# Patient Record
Sex: Female | Born: 1935 | Race: White | Hispanic: No | Marital: Married | Smoking: Never smoker
Health system: Southern US, Community
[De-identification: ages and names within clinical notes are randomized; demographics above are authoritative.]

## PROBLEM LIST (undated history)

## (undated) DIAGNOSIS — I4891 Unspecified atrial fibrillation: Secondary | ICD-10-CM

## (undated) DIAGNOSIS — M1990 Unspecified osteoarthritis, unspecified site: Secondary | ICD-10-CM

## (undated) DIAGNOSIS — Z8759 Personal history of other complications of pregnancy, childbirth and the puerperium: Secondary | ICD-10-CM

## (undated) DIAGNOSIS — I358 Other nonrheumatic aortic valve disorders: Secondary | ICD-10-CM

## (undated) DIAGNOSIS — I5022 Chronic systolic (congestive) heart failure: Secondary | ICD-10-CM

## (undated) DIAGNOSIS — I8390 Asymptomatic varicose veins of unspecified lower extremity: Secondary | ICD-10-CM

## (undated) DIAGNOSIS — Z8719 Personal history of other diseases of the digestive system: Secondary | ICD-10-CM

## (undated) DIAGNOSIS — E785 Hyperlipidemia, unspecified: Secondary | ICD-10-CM

## (undated) DIAGNOSIS — K219 Gastro-esophageal reflux disease without esophagitis: Secondary | ICD-10-CM

## (undated) DIAGNOSIS — H9190 Unspecified hearing loss, unspecified ear: Secondary | ICD-10-CM

## (undated) DIAGNOSIS — C4491 Basal cell carcinoma of skin, unspecified: Secondary | ICD-10-CM

## (undated) DIAGNOSIS — N289 Disorder of kidney and ureter, unspecified: Secondary | ICD-10-CM

## (undated) DIAGNOSIS — Z87442 Personal history of urinary calculi: Secondary | ICD-10-CM

## (undated) DIAGNOSIS — H9319 Tinnitus, unspecified ear: Secondary | ICD-10-CM

## (undated) DIAGNOSIS — Z973 Presence of spectacles and contact lenses: Secondary | ICD-10-CM

## (undated) DIAGNOSIS — I495 Sick sinus syndrome: Secondary | ICD-10-CM

## (undated) DIAGNOSIS — I10 Essential (primary) hypertension: Secondary | ICD-10-CM

## (undated) DIAGNOSIS — E669 Obesity, unspecified: Secondary | ICD-10-CM

## (undated) DIAGNOSIS — F419 Anxiety disorder, unspecified: Secondary | ICD-10-CM

## (undated) HISTORY — PX: NASAL SEPTUM SURGERY: SHX37

## (undated) HISTORY — DX: Hyperlipidemia, unspecified: E78.5

## (undated) HISTORY — PX: TONSILLECTOMY: SUR1361

## (undated) HISTORY — DX: Asymptomatic varicose veins of unspecified lower extremity: I83.90

## (undated) HISTORY — PX: DILATION AND CURETTAGE OF UTERUS: SHX78

## (undated) HISTORY — PX: CATARACT EXTRACTION W/ INTRAOCULAR LENS  IMPLANT, BILATERAL: SHX1307

## (undated) HISTORY — DX: Unspecified osteoarthritis, unspecified site: M19.90

## (undated) HISTORY — DX: Essential (primary) hypertension: I10

## (undated) HISTORY — DX: Other nonrheumatic aortic valve disorders: I35.8

## (undated) HISTORY — DX: Disorder of kidney and ureter, unspecified: N28.9

## (undated) HISTORY — DX: Obesity, unspecified: E66.9

## (undated) HISTORY — DX: Chronic systolic (congestive) heart failure: I50.22

## (undated) HISTORY — DX: Gastro-esophageal reflux disease without esophagitis: K21.9

---

## 1975-11-11 HISTORY — PX: BREAST CYST EXCISION: SHX579

## 1998-07-30 ENCOUNTER — Ambulatory Visit (HOSPITAL_COMMUNITY): Admission: RE | Admit: 1998-07-30 | Discharge: 1998-07-30 | Payer: Self-pay | Admitting: Family Medicine

## 1998-08-02 ENCOUNTER — Ambulatory Visit (HOSPITAL_COMMUNITY): Admission: RE | Admit: 1998-08-02 | Discharge: 1998-08-02 | Payer: Self-pay | Admitting: Family Medicine

## 1999-03-27 ENCOUNTER — Other Ambulatory Visit: Admission: RE | Admit: 1999-03-27 | Discharge: 1999-03-27 | Payer: Self-pay | Admitting: Family Medicine

## 2000-06-26 ENCOUNTER — Other Ambulatory Visit: Admission: RE | Admit: 2000-06-26 | Discharge: 2000-06-26 | Payer: Self-pay | Admitting: *Deleted

## 2000-09-04 ENCOUNTER — Encounter (INDEPENDENT_AMBULATORY_CARE_PROVIDER_SITE_OTHER): Payer: Self-pay | Admitting: Specialist

## 2000-09-04 ENCOUNTER — Ambulatory Visit (HOSPITAL_COMMUNITY): Admission: RE | Admit: 2000-09-04 | Discharge: 2000-09-04 | Payer: Self-pay | Admitting: *Deleted

## 2000-11-10 HISTORY — PX: OVARIAN CYST REMOVAL: SHX89

## 2000-11-17 ENCOUNTER — Encounter: Admission: RE | Admit: 2000-11-17 | Discharge: 2000-11-17 | Payer: Self-pay

## 2001-08-31 ENCOUNTER — Encounter: Payer: Self-pay | Admitting: Internal Medicine

## 2001-08-31 ENCOUNTER — Ambulatory Visit (HOSPITAL_COMMUNITY): Admission: RE | Admit: 2001-08-31 | Discharge: 2001-08-31 | Payer: Self-pay | Admitting: Internal Medicine

## 2002-09-01 ENCOUNTER — Ambulatory Visit (HOSPITAL_COMMUNITY): Admission: RE | Admit: 2002-09-01 | Discharge: 2002-09-01 | Payer: Self-pay | Admitting: Internal Medicine

## 2002-09-01 ENCOUNTER — Encounter: Payer: Self-pay | Admitting: Internal Medicine

## 2003-08-16 ENCOUNTER — Encounter: Admission: RE | Admit: 2003-08-16 | Discharge: 2003-08-16 | Payer: Self-pay | Admitting: Urology

## 2003-08-16 ENCOUNTER — Encounter: Payer: Self-pay | Admitting: Urology

## 2003-09-06 ENCOUNTER — Ambulatory Visit (HOSPITAL_COMMUNITY): Admission: RE | Admit: 2003-09-06 | Discharge: 2003-09-06 | Payer: Self-pay | Admitting: Obstetrics and Gynecology

## 2003-11-15 ENCOUNTER — Inpatient Hospital Stay (HOSPITAL_COMMUNITY): Admission: AD | Admit: 2003-11-15 | Discharge: 2003-11-15 | Payer: Self-pay | Admitting: Obstetrics & Gynecology

## 2003-11-15 ENCOUNTER — Ambulatory Visit (HOSPITAL_COMMUNITY): Admission: RE | Admit: 2003-11-15 | Discharge: 2003-11-15 | Payer: Self-pay | Admitting: Obstetrics and Gynecology

## 2003-11-15 ENCOUNTER — Encounter (INDEPENDENT_AMBULATORY_CARE_PROVIDER_SITE_OTHER): Payer: Self-pay | Admitting: Specialist

## 2005-03-25 ENCOUNTER — Encounter: Admission: RE | Admit: 2005-03-25 | Discharge: 2005-03-25 | Payer: Self-pay | Admitting: Allergy and Immunology

## 2005-04-24 ENCOUNTER — Other Ambulatory Visit: Admission: RE | Admit: 2005-04-24 | Discharge: 2005-04-24 | Payer: Self-pay | Admitting: Obstetrics and Gynecology

## 2005-05-20 ENCOUNTER — Encounter: Admission: RE | Admit: 2005-05-20 | Discharge: 2005-05-20 | Payer: Self-pay | Admitting: Internal Medicine

## 2005-10-21 ENCOUNTER — Ambulatory Visit: Payer: Self-pay | Admitting: Internal Medicine

## 2006-08-24 ENCOUNTER — Encounter: Admission: RE | Admit: 2006-08-24 | Discharge: 2006-08-24 | Payer: Self-pay | Admitting: Internal Medicine

## 2006-09-03 ENCOUNTER — Encounter: Admission: RE | Admit: 2006-09-03 | Discharge: 2006-09-03 | Payer: Self-pay | Admitting: Internal Medicine

## 2008-02-03 ENCOUNTER — Ambulatory Visit (HOSPITAL_COMMUNITY): Admission: RE | Admit: 2008-02-03 | Discharge: 2008-02-03 | Payer: Self-pay | Admitting: *Deleted

## 2008-03-16 ENCOUNTER — Ambulatory Visit: Admission: RE | Admit: 2008-03-16 | Discharge: 2008-03-16 | Payer: Self-pay | Admitting: Internal Medicine

## 2008-07-20 ENCOUNTER — Encounter: Admission: RE | Admit: 2008-07-20 | Discharge: 2008-07-20 | Payer: Self-pay | Admitting: Obstetrics and Gynecology

## 2009-10-10 ENCOUNTER — Encounter: Admission: RE | Admit: 2009-10-10 | Discharge: 2009-10-10 | Payer: Self-pay | Admitting: Internal Medicine

## 2010-07-17 ENCOUNTER — Encounter: Admission: RE | Admit: 2010-07-17 | Discharge: 2010-07-17 | Payer: Self-pay | Admitting: Internal Medicine

## 2010-08-07 ENCOUNTER — Encounter: Admission: RE | Admit: 2010-08-07 | Discharge: 2010-08-07 | Payer: Self-pay | Admitting: Interventional Radiology

## 2010-08-14 ENCOUNTER — Encounter: Admission: RE | Admit: 2010-08-14 | Discharge: 2010-08-14 | Payer: Self-pay | Admitting: Interventional Radiology

## 2010-09-11 ENCOUNTER — Encounter: Admission: RE | Admit: 2010-09-11 | Discharge: 2010-09-11 | Payer: Self-pay | Admitting: Interventional Radiology

## 2010-11-26 ENCOUNTER — Encounter
Admission: RE | Admit: 2010-11-26 | Discharge: 2010-11-26 | Payer: Self-pay | Source: Home / Self Care | Attending: Obstetrics and Gynecology | Admitting: Obstetrics and Gynecology

## 2010-11-30 ENCOUNTER — Encounter: Payer: Self-pay | Admitting: Internal Medicine

## 2010-12-03 ENCOUNTER — Encounter
Admission: RE | Admit: 2010-12-03 | Discharge: 2010-12-03 | Payer: Self-pay | Source: Home / Self Care | Attending: Obstetrics and Gynecology | Admitting: Obstetrics and Gynecology

## 2011-01-16 ENCOUNTER — Other Ambulatory Visit: Payer: Self-pay | Admitting: Interventional Radiology

## 2011-01-16 DIAGNOSIS — I83811 Varicose veins of right lower extremities with pain: Secondary | ICD-10-CM

## 2011-01-28 ENCOUNTER — Ambulatory Visit
Admission: RE | Admit: 2011-01-28 | Discharge: 2011-01-28 | Disposition: A | Discharge status: Home / Self Care | Payer: BLUE CROSS/BLUE SHIELD | Source: Ambulatory Visit | Attending: Interventional Radiology | Admitting: Interventional Radiology

## 2011-01-28 DIAGNOSIS — I83811 Varicose veins of right lower extremities with pain: Secondary | ICD-10-CM

## 2011-03-25 NOTE — Op Note (Signed)
NAMESHIAN, MAZZOTTA                ACCOUNT NO.:  [id number]   MEDICAL RECORD NO.:  [id number]          PATIENT TYPE:  AMB   LOCATION:  ENDO                         FACILITY:  [facility]   PHYSICIAN:  Waverly Ferrari, M.D.    DATE OF BIRTH:  [birth year]   DATE OF PROCEDURE:  DATE OF DISCHARGE:                               OPERATIVE REPORT   PROCEDURE:  Colonoscopy.   INDICATIONS:  Colon cancer screening.   ANESTHESIA:  Fentanyl 125 mcg, Versed 12.5 mg.   PROCEDURE:  With the patient mildly sedated in the left lateral  decubitus position, the Pentax videoscopic colonoscope was inserted in  the rectum and passed under direct vision to the cecum, identified by  the base of the cecum and ileocecal valve, both of which were  photographed.  From this point the colonoscope was slowly withdrawn,  taking circumferential views of the colonic mucosa, stopping only in the  rectum, which appeared normal on direct and showed hemorrhoids on  retroflexed view.  The endoscope was straightened and withdrawn.  The  patient's vital signs and pulse oximeter remained stable.  The patient  tolerated procedure well without apparent complications.   FINDINGS:  Internal hemorrhoids, otherwise unremarkable colonoscopic  examination to the cecum.   PLAN:  Consider repeat examination in 5-10 years.           ______________________________  Waverly Ferrari, M.D.     GMO/MEDQ  D:  02/03/2008  T:  02/03/2008  Job:  [id number]

## 2011-03-28 NOTE — Op Note (Signed)
NAME:  [name], QUIHUIS                          ACCOUNT NO.:  [id number]   MEDICAL RECORD NO.:  [id number]                   PATIENT TYPE:  AMB   LOCATION:  SDC                                  FACILITY:  [facility]   PHYSICIAN:  Darlyn Chamber, M.D.                DATE OF BIRTH:  Apr 13, 1930   DATE OF PROCEDURE:  11/15/2003  DATE OF DISCHARGE:                                 OPERATIVE REPORT   PREOPERATIVE DIAGNOSIS:  Left ovarian cyst.   POSTOPERATIVE DIAGNOSIS:  Left ovarian cyst.   OPERATIVE PROCEDURE:  Open laparoscopy, left salpingo-oophorectomy.   SURGEON:  Darlyn Chamber, M.D.   ANESTHESIA:  General endotracheal.   ESTIMATED BLOOD LOSS:  Minimal.   PACKS AND DRAINS:  None.   INTRAOPERATIVE BLOOD REPLACED:  None.   COMPLICATIONS:  None.   INDICATIONS:  As noted in the history and physical.   The procedure is as follows:  The patient was taken to the OR and placed in  the supine position.  After a satisfactory level of general endotracheal  anesthesia was obtained, the patient was placed in the dorsal lithotomy  position using the Allen stirrups.  The abdomen, perineum, and vagina were  prepped out with Betadine.  Bladder was emptied by in-and-out  catheterization.  A Hulka tenaculum was put in place and secured.  The  patient was draped out for laparoscopy.  A subumbilical incision was made  with a knife.  The incision was extended through the subcutaneous tissue.  The fascia was identified and entered.  The incision in the fascia extended  laterally.  The peritoneum was entered.  There was no evidence of injury to  adjacent organs.  Open laparoscopic trocar was put in place and secured and  the abdomen was inflated with carbon dioxide.  The operative laparoscope was  then introduced.  There was no evidence of injury to adjacent organs.  A 5  mm trocar was put in place in the suprapubic area in the left lower  quadrant.  Visualization revealed cystic enlargement of the left  ovary  measuring approximately 6-7 cm.  There were no adhesions or external  excrescences, no free fluid was noted.  The right ovary and tube were  normal.  The uterus was normal.  The upper abdomen including liver and tip  of the gallbladder were clear.  The appendix must have been retrocecal.  It  was difficult to visualize, but there was no evidence of abnormalities in  either gutter.  We obtained pelvic washings.  We then secured the cyst in  the left ovary, identified the left ovarian vasculature.  The ureter was  deep in the pelvis away from the vasculature.  Using the Gyrus bipolar, the  left ovarian vasculature was cauterized and incised, the left tube and  mesosalpinx were cauterized and incised.  The mesenteric attachments to the  ovary were cauterized and incised,  and the left utero-ovarian pedicle was  cauterized and incised.  Using the Endobag, the ovary was placed in the  Endobag and removed through the subumbilical incision intact and sent for  pathologic review.  Revisualization in the pelvic cavity revealed good  hemostasis.  We thoroughly irrigated the pelvis, and again nothing was  bleeding.  At this point in time the abdomen was deflated of its carbon  dioxide, all trocars removed, subumbilical fascia closed with two figure-of-  eights of 0 Vicryl, skin with interrupted subcuticular 4-0 Vicryl.  The  suprapubic incision was closed with Dermabond.  The Hulka tenaculum was then  removed, the patient taken out of the dorsal lithotomy position, once alert  and extubated transferred to the recovery room in good condition.  The  sponge, instrument, and needle count reported as correct by the circulating  nurse x2.                                               Darlyn Chamber, M.D.    JSM/MEDQ  D:  11/15/2003  T:  11/15/2003  Job:  [id number]

## 2011-03-28 NOTE — Procedures (Signed)
[facility]  Patient:    [name], [name]                         MRN: [id number] Adm. Date:  [id number] Attending:  Jim Desanctis                           Procedure Report  PROCEDURE:  Colonoscopy.  INDICATIONS:  Hemoccult positivity.  ANESTHESIA:  Demerol an extra 20 mg, Versed 2 mg.  DESCRIPTION OF PROCEDURE:  With patient mildly sedated in the left lateral decubitus position, the Olympus videoscopic colonoscope was inserted in the rectum and passed under direct vision to the cecum.  Cecum identified by the ileocecal valve and appendiceal orifice, both of which were photographed after we had cleared the cecum of some fecal debris.  The endoscope was then placed in partial flexion to enter into the terminal ileum, which appeared normal as well.  The endoscope was then withdrawn, taking circumferential views of the terminal ileum and subsequently the remaining colonic mucosa, which otherwise appeared normal other than a small diverticulum seen in the sigmoid colon. The endoscope was pulled back to the rectum, which appeared normal on direct and retroflexed view.  The endoscope was straightened and withdrawn.  The patients vital signs and pulse oximetry remained stable.  The patient tolerated the procedure well without apparent complications.  FINDINGS:  Rare diverticula in the sigmoid colon, otherwise unremarkable colonoscopic examination.  PLAN:  Repeat examination in five to 10 years. DD:  09/04/00 TD:  09/04/00 Job: [id number]

## 2011-03-28 NOTE — H&P (Signed)
NAME:  [name], [name]                          ACCOUNT NO.:  [id number]   MEDICAL RECORD NO.:  [id number]                   PATIENT TYPE:  AMB   LOCATION:  SDC                                  FACILITY:  [facility]   PHYSICIAN:  Darlyn Chamber, M.D.                DATE OF BIRTH:  [birth year]   DATE OF ADMISSION:  11/15/2003  DATE OF DISCHARGE:                                HISTORY & PHYSICAL   This is a 77 year old postmenopausal white female who presents for  laparoscopic left salpingo-oophorectomy.   HISTORY OF PRESENT ILLNESS:  In relation to the present admission, the  patient was referred to our practice by Mary Boxer. Roberson for evaluation  of a left adnexal mass.  She has been evaluated for left-sided  abdominopelvic discomfort.  It was thought to be secondary to renal  lithiasis.  She underwent a CT scan that revealed a 6 cm left ovarian mass.  Subsequent ultrasound revealed a large cystic structure measuring 6 cm in  greatest dimension.  Several calcifications were noted with one septation.  This was felt to be a complex cyst.  She was seen in our office for further  evaluation.  Ultrasound evaluation confirmed the presence of a 6 cm mostly  simple cyst of the left ovary.  There was one septation.  Her CA-125 was  obtained and this was actually within normal limits.  No free fluid was  noted, no other signs of abnormality.  In view of that the patient now  presents for laparoscopic left salpingo-oophorectomy for further evaluation  and indeed management.   ALLERGIES:  She has no known drug allergies.   MEDICATIONS:  1. Norvasc.  2. Toprol.  3. Cardizem.   PAST MEDICAL HISTORY:  The patient does have a history of hypertension under  active evaluation and management by [name].  Otherwise, usual childhood  disease without any significant sequelae.   PAST SURGICAL HISTORY:  The patient had a breast biopsy done 30 years ago.   OBSTETRICAL HISTORY:  She had two spontaneous  vaginal deliveries.   FAMILY HISTORY:  Mother has history of diabetes.  She has a paternal  grandfather with colon cancer.  Mother also has a history of hypertension.  Father has history of heart disease.   SOCIAL HISTORY:  Reveals no tobacco or alcohol use.   REVIEW OF SYSTEMS:  Noncontributory.   PHYSICAL EXAMINATION:  VITAL SIGNS:  The patient is afebrile with stable  vital signs.  HEENT:  The patient is normocephalic.  Pupils are equal, round and reactive  to light and accommodation.  Extraocular movements were intact.  Sclerae and  conjunctivae clear.  Oropharynx clear.  NECK:  Without thyromegaly.  BREASTS:  No discreet masses.  LUNGS:  Clear.  CARDIOVASCULAR:  Regular rate and rhythm without murmurs or gallops.  ABDOMEN:  Exam is benign, no mass, organomegaly or tenderness.  PELVIC:  Normal  external genitalia.  Vaginal mucosa is clear.  Cervix  unremarkable.  Uterus normal size, shape and contour.  Left adnexal  fullness.  Right adnexa unremarkable.  EXTREMITIES:  Trace edema.  NEUROLOGIC:  Exam is grossly within normal limits.   IMPRESSION:  Cyst of the left ovary, rule out ovarian neoplasm.   PLAN:  The patient will undergo laparoscopic removal.  She does understand  should this be malignant or borderline, further staging procedures may be  required.  The overall risks of surgery have been discussed including the  risk of anesthetics.  The risk of infection.  The risk of vascular injury  that could lead to hemorrhage requiring transfusion with the risk of AIDS or  hepatitis.  The risk of injury to adjacent organs that could require further  exploratory surgery.  The risk of deep venous thrombosis and pulmonary  embolus.  The patient expressed her understanding of indications and risks.                                               Darlyn Chamber, M.D.    JSM/MEDQ  D:  11/15/2003  T:  11/15/2003  Job:  [id number]

## 2011-03-28 NOTE — Procedures (Signed)
[facility]  Patient:    [name], [name]                         MRN: [id number] Proc. Date: 09/04/00 Adm. Date:  [id number] Attending:  Jim Desanctis                           Procedure Report  PROCEDURE:  Upper endoscopy.  SURGEON:  Jim Desanctis, M.D.  INDICATIONS:  Hemoccult positivity.  ANESTHESIA:  Demerol 60 mg and Versed 6 mg.  DESCRIPTION OF PROCEDURE:  With the patient mildly sedated in the left lateral decubitus position, the Olympus videoscopic endoscope was inserted in the mouth and passed under direct vision through the esophagus which appeared normal.  The stomach, fundus, and body all appeared normal.  There was a small area in the antrum which was erythematous which we photographed and biopsied. The duodenal bulb and second portion of the duodenum appeared normal.  From this point, the endoscope was slowly withdrawn, taking circumferential views of the entire duodenal mucosa until the endoscope had been pulled back into the stomach and placed in retroflexion to view the stomach from below and this appeared normal and was photographed.  The endoscope was straightened and withdrawn, taking circumferential views of the remaining gastric and esophageal mucosa which otherwise appeared normal.  The patients vital signs and pulse oximeter remained stable.  The patient tolerated the procedure well and without apparent complications.  FINDINGS:  Localized erythema of the antrum, await biopsy report.  The patient will call me for the results and follow up with me as an outpatient and proceed to colonoscopy as planned. DD:  09/04/00 TD:  09/04/00 Job: [id number]

## 2011-06-24 ENCOUNTER — Other Ambulatory Visit: Payer: Self-pay | Admitting: Internal Medicine

## 2011-06-24 DIAGNOSIS — N6459 Other signs and symptoms in breast: Secondary | ICD-10-CM

## 2011-07-02 ENCOUNTER — Ambulatory Visit
Admission: RE | Admit: 2011-07-02 | Discharge: 2011-07-02 | Disposition: A | Discharge status: Home / Self Care | Payer: BLUE CROSS/BLUE SHIELD | Source: Ambulatory Visit | Attending: Internal Medicine | Admitting: Internal Medicine

## 2011-07-02 DIAGNOSIS — N6459 Other signs and symptoms in breast: Secondary | ICD-10-CM

## 2011-07-31 ENCOUNTER — Encounter: Payer: Self-pay | Admitting: Cardiology

## 2011-08-14 ENCOUNTER — Encounter: Payer: Self-pay | Admitting: Cardiology

## 2011-08-14 ENCOUNTER — Ambulatory Visit (INDEPENDENT_AMBULATORY_CARE_PROVIDER_SITE_OTHER): Payer: Medicare Other | Admitting: Cardiology

## 2011-08-14 VITALS — BP 130/60 | HR 60 | Ht 64.0 in | Wt 182.0 lb

## 2011-08-14 DIAGNOSIS — I10 Essential (primary) hypertension: Secondary | ICD-10-CM

## 2011-08-14 DIAGNOSIS — E785 Hyperlipidemia, unspecified: Secondary | ICD-10-CM

## 2011-08-14 DIAGNOSIS — R001 Bradycardia, unspecified: Secondary | ICD-10-CM

## 2011-08-14 DIAGNOSIS — I498 Other specified cardiac arrhythmias: Secondary | ICD-10-CM

## 2011-08-14 NOTE — Patient Instructions (Signed)
It would be reasonable to reduce your metoprolol to 25 mg twice a day.  Continue your other medications.  I will be happy to see you as needed.

## 2011-08-14 NOTE — Assessment & Plan Note (Signed)
Blood pressure is under excellent control on her current medications. Since her blood pressure is doing so well off recommend reducing her metoprolol further to 25 mg twice a day. I think this will help with her energy level. She is significantly bradycardic on her current therapy. She is going to monitor her blood pressure medicine though if it increases with this reduction in dose. Otherwise I feel she is an excellent cardiac health and no appropriate risk factor modification. I do not feel that any further cardiac testing is warranted at this time and I will see her back as needed.

## 2011-08-14 NOTE — Progress Notes (Signed)
[name] Date of Birth: 1930/10/16   History of Present Illness: [name] is seen at the request of Dr. Shelia Media for cardiac evaluation. She has a history of hypertension and dyslipidemia. She really denies any significant cardiac complaints. She does feel fatigued at times. She was evaluated in 2007 with an echocardiogram which was unremarkable. She also had a nuclear stress test which was normal. She was noted to be significantly bradycardic at that time and her metoprolol dose was reduced to 50 mg twice a day. She did notice an improvement in her energy level with this. Her blood pressure has been under excellent control. She denies any current chest pain, shortness of breath, palpitations, or dizziness.  Current Outpatient Prescriptions on File Prior to Visit  Medication Sig Dispense Refill  . atorvastatin (LIPITOR) 10 MG tablet Take 5 mg by mouth daily.        . metoprolol (LOPRESSOR) 100 MG tablet Take 100 mg by mouth 2 (two) times daily. 1/2 tablet daily      . Multiple Vitamin (MULTIVITAMIN) tablet Take 1 tablet by mouth daily.          Allergies  Allergen Reactions  . Ace Inhibitors   . Celebrex (Celecoxib)   . Norvasc (Amlodipine Besylate)     Higher doses   . Vioxx (Rofecoxib)     Past Medical History  Diagnosis Date  . Hypertension   . Bradycardia   . Chronic cough   . Aortic valve sclerosis   . Edema of lower extremity   . Hyperlipidemia   . OA (osteoarthritis)   . GERD (gastroesophageal reflux disease)   . Renal insufficiency     Past Surgical History  Procedure Date  . Cyst removal from ovary 2002  . Nasal septum surgery   . Cardiovascular stress test 06/25/2006    EF 62%    History  Smoking status  . Never Smoker   Smokeless tobacco  . Not on file    History  Alcohol Use No    Family History  Problem Relation Age of Onset  . Diabetes Mother   . Heart disease Father   . Hypertension Sister   . Hypertension Brother     Review of  Systems: As noted in history of present illness..  All other systems were reviewed and are negative.  Physical Exam: BP 130/60  Pulse 60  Ht 5\' 4"  (1.626 m)  Wt 182 lb (82.555 kg)  BMI 31.24 kg/m2 The patient is alert and oriented x 3.  The mood and affect are normal.  The skin is warm and dry.  Color is normal.  The HEENT exam reveals that the sclera are nonicteric.  The mucous membranes are moist.  The carotids are 2+ without bruits.  There is no thyromegaly.  There is no JVD.  The lungs are clear.  The chest wall is non tender.  The heart exam reveals a regular rate with a normal S1 and S2.  There are no murmurs, gallops, or rubs.  The PMI is not displaced.   Abdominal exam reveals good bowel sounds.  There is no guarding or rebound.  There is no hepatosplenomegaly or tenderness.  There are no masses.  Exam of the legs reveal no clubbing, cyanosis, or edema.  The legs are without rashes.  The distal pulses are intact.  Cranial nerves II - XII are intact.  Motor and sensory functions are intact.  The gait is normal.  LABORATORY DATA: ECG dated  08/07/2011 shows sinus bradycardia with a rate of 51 beats per minute. There is left axis deviation with voltage criteria for LVH.  Assessment / Plan:

## 2011-10-09 ENCOUNTER — Encounter: Payer: Self-pay | Admitting: Cardiology

## 2012-05-20 ENCOUNTER — Ambulatory Visit (HOSPITAL_COMMUNITY): Payer: Medicare Other | Attending: Nurse Practitioner

## 2012-05-20 ENCOUNTER — Encounter: Payer: Self-pay | Admitting: Nurse Practitioner

## 2012-05-20 ENCOUNTER — Ambulatory Visit (INDEPENDENT_AMBULATORY_CARE_PROVIDER_SITE_OTHER): Payer: Medicare Other | Admitting: Nurse Practitioner

## 2012-05-20 VITALS — BP 128/68 | HR 52 | Ht 64.0 in | Wt 184.8 lb

## 2012-05-20 DIAGNOSIS — I079 Rheumatic tricuspid valve disease, unspecified: Secondary | ICD-10-CM | POA: Insufficient documentation

## 2012-05-20 DIAGNOSIS — R001 Bradycardia, unspecified: Secondary | ICD-10-CM

## 2012-05-20 DIAGNOSIS — F430 Acute stress reaction: Secondary | ICD-10-CM | POA: Insufficient documentation

## 2012-05-20 DIAGNOSIS — N289 Disorder of kidney and ureter, unspecified: Secondary | ICD-10-CM | POA: Insufficient documentation

## 2012-05-20 DIAGNOSIS — I498 Other specified cardiac arrhythmias: Secondary | ICD-10-CM | POA: Insufficient documentation

## 2012-05-20 DIAGNOSIS — I059 Rheumatic mitral valve disease, unspecified: Secondary | ICD-10-CM | POA: Insufficient documentation

## 2012-05-20 DIAGNOSIS — I10 Essential (primary) hypertension: Secondary | ICD-10-CM | POA: Insufficient documentation

## 2012-05-20 DIAGNOSIS — E785 Hyperlipidemia, unspecified: Secondary | ICD-10-CM

## 2012-05-20 DIAGNOSIS — R609 Edema, unspecified: Secondary | ICD-10-CM

## 2012-05-20 DIAGNOSIS — I517 Cardiomegaly: Secondary | ICD-10-CM | POA: Insufficient documentation

## 2012-05-20 DIAGNOSIS — I359 Nonrheumatic aortic valve disorder, unspecified: Secondary | ICD-10-CM | POA: Insufficient documentation

## 2012-05-20 NOTE — Progress Notes (Signed)
[name] Date of Birth: Apr 13, 1930 Medical Record [id number]  History of Present Illness: [name] is seen today for a work in visit. She is seen for Dr. Martinique. She is a former patient of Dr. Janyth Pupa. She has HTN and HLD. No known CAD and has had prior negative stress testing. Last echo in 2007 was normal.   She comes in today. She is here with her daughter. She is here because of fatigue. She has known bradycardia and has had her BB cut back at her last visit back in October. She noted heart rates down in the 40's at home and cut it back to just 25 mg a day. She feels better. Also with some swelling in her legs. It does go down overnight. She does not use support stockings. She says she doesn't use much salt but her daughter disagrees. No chest pain. Still with chronic cough. Has GERD. Not exercising regularly but tries to stay active. Blood pressure has been doing well. She is worried about having heart failure. Apparently her father and grandfather died of CHF.   Current Outpatient Prescriptions on File Prior to Visit  Medication Sig Dispense Refill  . atorvastatin (LIPITOR) 10 MG tablet Take 5 mg by mouth daily.        . cloNIDine (CATAPRES) 0.1 MG tablet Take 0.1 mg by mouth daily.      Marland Kitchen DIOVAN HCT 320-25 MG per tablet Take 1 tablet by mouth Daily.      . Glucosamine-Chondroit-Vit C-Mn (GLUCOSAMINE 1500 COMPLEX PO) Take by mouth daily.        . Multiple Vitamin (MULTIVITAMIN) tablet Take 1 tablet by mouth daily.        . pantoprazole (PROTONIX) 40 MG tablet Take 40 mg by mouth 2 (two) times daily.       Marland Kitchen zolpidem (AMBIEN) 5 MG tablet Take 5 mg by mouth at bedtime as needed.       Marland Kitchen levocetirizine (XYZAL) 5 MG tablet Take 5 mg by mouth every evening.        Allergies  Allergen Reactions  . Ace Inhibitors   . Celebrex (Celecoxib)   . Norvasc (Amlodipine Besylate)     Higher doses   . Vioxx (Rofecoxib)     Past Medical History  Diagnosis Date  . Hypertension   .  Bradycardia   . Chronic cough   . Aortic valve sclerosis   . Edema of lower extremity   . Hyperlipidemia   . OA (osteoarthritis)   . GERD (gastroesophageal reflux disease)   . Renal insufficiency   . Obesity     Past Surgical History  Procedure Date  . Cyst removal from ovary 2002  . Nasal septum surgery   . Cardiovascular stress test 06/25/2006    EF 62%    History  Smoking status  . Never Smoker   Smokeless tobacco  . Not on file    History  Alcohol Use No    Family History  Problem Relation Age of Onset  . Diabetes Mother   . Heart disease Father   . Hypertension Sister   . Hypertension Brother     Review of Systems: The review of systems is per the HPI.  All other systems were reviewed and are negative.  Physical Exam: BP 128/68  Pulse 52  Ht 5\' 4"  (1.626 m)  Wt 184 lb 12.8 oz (83.825 kg)  BMI 31.72 kg/m2 Patient is very pleasant and in no acute distress. She is  obese. Skin is warm and dry. Color is normal.  HEENT is unremarkable. Normocephalic/atraumatic. PERRL. Sclera are nonicteric. Neck is supple. No masses. No JVD. Lungs are clear. Cardiac exam shows a regular rate and rhythm. Abdomen is soft. Extremities are with trace edema. She has multiple varicosities. Gait and ROM are intact. No gross neurologic deficits noted.  LABORATORY DATA: EKG today shows sinus bradycardia. Rate of 52.    Assessment / Plan:

## 2012-05-20 NOTE — Patient Instructions (Addendum)
Stay on your current medicines but cut the Metoprolol 25 mg to a half two times a day  Minimize your salt use  Get some support stockings and wear during the course of the day  We are going to check an ultrasound of your heart  We will see you back as needed.  Call the [facility] office at [phone] if you have any questions, problems or concerns.

## 2012-05-20 NOTE — Progress Notes (Signed)
Echocardiogram performed.  

## 2012-05-20 NOTE — Assessment & Plan Note (Signed)
She does have some mild edema on exam. She does report that it goes down overnight. I suspect she is using too much salt. She is on HCTZ. I have asked her to cut back on her salt use and use support stockings. If this does not help, then we will address the Norvasc. Will also update her echo. We will tentatively see her back as needed. Patient is agreeable to this plan and will call if any problems develop in the interim.

## 2012-05-20 NOTE — Assessment & Plan Note (Signed)
Blood pressure looks good. I have left her on her current regimen.

## 2012-05-20 NOTE — Assessment & Plan Note (Signed)
She has cut her metoprolol back further. She is on the short acting. I have changed her over to 12.5 BID. She will continue to monitor.

## 2012-06-01 ENCOUNTER — Other Ambulatory Visit: Payer: Self-pay | Admitting: Internal Medicine

## 2012-06-01 DIAGNOSIS — N6489 Other specified disorders of breast: Secondary | ICD-10-CM

## 2012-09-09 ENCOUNTER — Ambulatory Visit
Admission: RE | Admit: 2012-09-09 | Discharge: 2012-09-09 | Disposition: A | Discharge status: Home / Self Care | Payer: Medicare Other | Source: Ambulatory Visit | Attending: Internal Medicine | Admitting: Internal Medicine

## 2012-09-09 DIAGNOSIS — N6489 Other specified disorders of breast: Secondary | ICD-10-CM

## 2013-03-23 ENCOUNTER — Other Ambulatory Visit: Payer: Self-pay | Admitting: Gastroenterology

## 2013-03-23 DIAGNOSIS — R109 Unspecified abdominal pain: Secondary | ICD-10-CM

## 2013-03-23 DIAGNOSIS — R634 Abnormal weight loss: Secondary | ICD-10-CM

## 2013-03-29 ENCOUNTER — Ambulatory Visit
Admission: RE | Admit: 2013-03-29 | Discharge: 2013-03-29 | Disposition: A | Discharge status: Home / Self Care | Payer: Medicare Other | Source: Ambulatory Visit | Attending: Gastroenterology | Admitting: Gastroenterology

## 2013-03-29 DIAGNOSIS — R634 Abnormal weight loss: Secondary | ICD-10-CM

## 2013-03-29 DIAGNOSIS — R109 Unspecified abdominal pain: Secondary | ICD-10-CM

## 2013-03-29 MED ORDER — IOHEXOL 300 MG/ML  SOLN
100.0000 mL | Freq: Once | INTRAMUSCULAR | Status: AC | PRN
  Administered 2013-03-29: 100 mL via INTRAVENOUS

## 2015-07-10 ENCOUNTER — Other Ambulatory Visit: Payer: Self-pay | Admitting: Family Medicine

## 2015-07-10 DIAGNOSIS — M25552 Pain in left hip: Secondary | ICD-10-CM

## 2015-07-12 ENCOUNTER — Ambulatory Visit
Admission: RE | Admit: 2015-07-12 | Discharge: 2015-07-12 | Disposition: A | Discharge status: Home / Self Care | Payer: Medicare Other | Source: Ambulatory Visit | Attending: Family Medicine | Admitting: Family Medicine

## 2015-07-12 DIAGNOSIS — M25552 Pain in left hip: Secondary | ICD-10-CM

## 2015-07-15 ENCOUNTER — Other Ambulatory Visit: Payer: Self-pay

## 2015-09-13 ENCOUNTER — Encounter: Payer: Self-pay | Admitting: Vascular Surgery

## 2015-09-13 ENCOUNTER — Other Ambulatory Visit: Payer: Self-pay | Admitting: *Deleted

## 2015-09-13 DIAGNOSIS — I83813 Varicose veins of bilateral lower extremities with pain: Secondary | ICD-10-CM

## 2015-11-08 ENCOUNTER — Encounter: Payer: Self-pay | Admitting: Vascular Surgery

## 2015-11-16 ENCOUNTER — Ambulatory Visit (INDEPENDENT_AMBULATORY_CARE_PROVIDER_SITE_OTHER): Payer: Medicare Other | Admitting: Vascular Surgery

## 2015-11-16 ENCOUNTER — Encounter: Payer: Self-pay | Admitting: Vascular Surgery

## 2015-11-16 ENCOUNTER — Ambulatory Visit (HOSPITAL_COMMUNITY)
Admission: RE | Admit: 2015-11-16 | Discharge: 2015-11-16 | Disposition: A | Discharge status: Home / Self Care | Payer: Medicare Other | Source: Ambulatory Visit | Attending: Vascular Surgery | Admitting: Vascular Surgery

## 2015-11-16 VITALS — BP 137/62 | HR 66 | Temp 97.3°F | Resp 18 | Ht 64.0 in | Wt 167.0 lb

## 2015-11-16 DIAGNOSIS — I83813 Varicose veins of bilateral lower extremities with pain: Secondary | ICD-10-CM | POA: Diagnosis present

## 2015-11-16 DIAGNOSIS — I10 Essential (primary) hypertension: Secondary | ICD-10-CM | POA: Insufficient documentation

## 2015-11-16 DIAGNOSIS — I8393 Asymptomatic varicose veins of bilateral lower extremities: Secondary | ICD-10-CM | POA: Diagnosis not present

## 2015-11-16 DIAGNOSIS — I872 Venous insufficiency (chronic) (peripheral): Secondary | ICD-10-CM | POA: Diagnosis not present

## 2015-11-16 DIAGNOSIS — E785 Hyperlipidemia, unspecified: Secondary | ICD-10-CM | POA: Insufficient documentation

## 2015-11-16 NOTE — Progress Notes (Signed)
Referred by:  Chesley Noon, MD [address] [place], [place] [address]  Reason for referral: bilateral varicose veins and swelling   History of Present Illness  [name] is a 82 y.o. ([birth year]) female s/p h/o R GSV procedure who presents with chief complaint: swelling both legs and varicose veins.  Patient notes long standing history of swelling in legs with stable varicose veins.  The patient's symptoms include: left hip pain.  The patient is being worked up for a L Total hip replacement.  The patient has had no history of DVT, known history of pregnancy, known history of varicose vein, no history of venous stasis ulcers, no history of  Lymphedema and no history of skin changes in lower legs.  There is known family history of venous disorders.  The patient has used OTC compression stockings in the past.   Past Medical History  Diagnosis Date  . Hypertension   . Bradycardia   . Chronic cough   . Aortic valve sclerosis   . Edema of lower extremity   . Hyperlipidemia   . OA (osteoarthritis)   . GERD (gastroesophageal reflux disease)   . Renal insufficiency   . Obesity   . Leg pain, left   . Hip pain     left  . Varicose veins     Past Surgical History  Procedure Laterality Date  . Cyst removal from ovary  2002  . Nasal septum surgery    . Cardiovascular stress test  06/25/2006    EF 62%    Social History   Social History  . Marital Status: Married    Spouse Name: N/A  . Number of Children: N/A  . Years of Education: N/A   Occupational History  . Not on file.   Social History Main Topics  . Smoking status: Never Smoker   . Smokeless tobacco: Not on file  . Alcohol Use: No  . Drug Use: No  . Sexual Activity: Not Currently   Other Topics Concern  . Not on file   Social History Narrative    Family History  Problem Relation Age of Onset  . Diabetes Mother   . Heart disease Father   . Hypertension Sister   . Hypertension Brother      Current Outpatient Prescriptions  Medication Sig Dispense Refill  . acetaminophen (TYLENOL) 325 MG tablet Take 650 mg by mouth every 6 (six) hours as needed.    . ALPRAZolam (XANAX) 0.5 MG tablet Take 0.5 mg by mouth 3 (three) times daily as needed for anxiety.    Marland Kitchen amLODipine (NORVASC) 10 MG tablet Take 5 mg by mouth daily.     Marland Kitchen aspirin 81 MG tablet Take 81 mg by mouth daily.    Marland Kitchen atorvastatin (LIPITOR) 10 MG tablet Take 5 mg by mouth daily.      . cloNIDine (CATAPRES) 0.1 MG tablet Take 0.1 mg by mouth daily.    . cyclobenzaprine (FLEXERIL) 10 MG tablet Take 10 mg by mouth 3 (three) times daily as needed for muscle spasms.    Marland Kitchen DIOVAN HCT 320-25 MG per tablet Take 1 tablet by mouth Daily.    . Glucosamine-Chondroit-Vit C-Mn (GLUCOSAMINE 1500 COMPLEX PO) Take by mouth daily.      Marland Kitchen HYDROcodone-acetaminophen (NORCO/VICODIN) 5-325 MG tablet Take 1 tablet by mouth every 6 (six) hours as needed for moderate pain.    Marland Kitchen levocetirizine (XYZAL) 5 MG tablet Take 5 mg by mouth every evening.    Marland Kitchen  metoprolol tartrate (LOPRESSOR) 25 MG tablet Take 25 mg by mouth 2 (two) times daily.     . Multiple Vitamin (MULTIVITAMIN) tablet Take 1 tablet by mouth daily.      . Omega-3 Fatty Acids (FISH OIL) 1000 MG CAPS Take by mouth daily.    . pantoprazole (PROTONIX) 40 MG tablet Take 40 mg by mouth 2 (two) times daily.     Marland Kitchen PREDNISONE, PAK, PO Take by mouth.    . zolpidem (AMBIEN) 5 MG tablet Take 5 mg by mouth at bedtime as needed.     . traMADol (ULTRAM) 50 MG tablet Take by mouth every 6 (six) hours as needed. Reported on 11/16/2015     No current facility-administered medications for this visit.    Allergies  Allergen Reactions  . Ace Inhibitors   . Celebrex [Celecoxib]   . Norvasc [Amlodipine Besylate]     Higher doses   . Vioxx [Rofecoxib]      REVIEW OF SYSTEMS:  (Positives checked otherwise negative)  CARDIOVASCULAR:   [ ]  chest pain,  [ ]  chest pressure,  [x]  palpitations,  [ ]   shortness of breath when laying flat,  [ ]  shortness of breath with exertion,   [x]  pain in feet when walking,  [ ]  pain in feet when laying flat, [ ]  history of blood clot in veins (DVT),  [ ]  history of phlebitis,  [ ]  swelling in legs,  [ ]  varicose veins  PULMONARY:   [ ]  productive cough,  [ ]  asthma,  [ ]  wheezing  NEUROLOGIC:   [x]  weakness in arms or legs,  [ ]  numbness in arms or legs,  [ ]  difficulty speaking or slurred speech,  [ ]  temporary loss of vision in one eye,  [ ]  dizziness  HEMATOLOGIC:   [ ]  bleeding problems,  [ ]  problems with blood clotting too easily  MUSCULOSKEL:   [ ]  joint pain, [ ]  joint swelling  GASTROINTEST:   [ ]  vomiting blood,  [ ]  blood in stool     GENITOURINARY:   [ ]  burning with urination,  [ ]  blood in urine  PSYCHIATRIC:   [ ]  history of major depression  INTEGUMENTARY:   [ ]  rashes,  [ ]  ulcers  CONSTITUTIONAL:   [ ]  fever,  [ ]  chills   Physical Examination  Filed Vitals:   11/16/15 1242  BP: 137/62  Pulse: 66  Temp: 97.3 F (36.3 C)  TempSrc: Oral  Resp: 18  Height: 5\' 4"  (1.626 m)  Weight: 167 lb (75.751 kg)  SpO2: 100%   Body mass index is 28.65 kg/(m^2).  General: A&O x 3, WD, elderly  Head: [place]/AT  Ear/Nose/Throat: Hearing grossly intact, nares without erythema or drainage, oropharynx without Erythema/Exudate, Mallampati score: 3  Eyes: PERRLA, EOMI  Neck: Supple, no nuchal rigidity, no palpable LAD  Pulmonary: Sym exp, good air movt, CTAB, no rales, rhonchi, & wheezing  Cardiac: RRR, Nl S1, S2, no Murmurs, rubs or gallops  Vascular: Vessel Right Left  Radial Palpable Palpable  Brachial Palpable Palpable  Carotid Palpable, without bruit Palpable, without bruit  Aorta Not palpable N/A  Femoral Palpable Palpable  Popliteal Not palpable Not palpable  PT Palpable Faintly Palpable  DP Palpable Faintly Palpable   Gastrointestinal: soft, NTND, no G/R, no HSM, no masses, no CVAT  B  Musculoskeletal: M/S 5/5 throughout , Extremities without ischemic changes, trace edema B, no LDS, extensive varicosities with spider veins  Neurologic: CN 2-12  intact , Pain and light touch intact in extremities , Motor exam as listed above  Psychiatric: Judgment intact, Mood & affect appropriate for pt's clinical situation  Dermatologic: See M/S exam for extremity exam, no rashes otherwise noted  Lymph : No Cervical, Axillary, or Inguinal lymphadenopathy    Non-Invasive Vascular Imaging  BLE Venous Insufficiency Duplex (Date: 11/16/2015):   RLE:   no DVT and SVT,   no GSV reflux as no vein visualized  no SSV reflux,  + deep venous reflux: CFV  LLE:  no DVT and SVT,   + GSV reflux: limited segment of reflux  No SSV reflux,  no deep venous reflux   Outside Studies/Documentation 5 pages of outside documents were reviewed including: outside Ortho chart.   Medical Decision Making  RIA KATE is a 82 y.o. female who presents with: minimal BLE chronic venous insufficiency (C2), L hip OA   Her varicose veins are cosmetic in nature and I doubt they contribute much to her leg sx given her limited CVI on insufficiency duplex.  Based on the patient's history and examination, I recommend: OTC compression.  I doubt this patient with L hip OA requiring total hip replacement, will be able to get 20-30 mm Hg compression stocking onto her legs.  There is nothing anatomically that would predispose her to DVT after hip replacement.    Reportedly, she is getting a thrombophilia panel which should investigate the medical side of the equation.  The patient can follow up as needed.  Thank you for allowing [place] to participate in this patient's care.   Adele Barthel, MD [facility] Office: [phone] Pager: [phone]  11/16/2015, 1:04 PM

## 2015-11-29 ENCOUNTER — Encounter: Payer: Self-pay | Admitting: Cardiology

## 2015-11-30 NOTE — H&P (Signed)
TOTAL HIP ADMISSION H&P  Patient is admitted for left total hip arthroplasty, anterior approach.  Subjective:  Chief Complaint:  Left hip primary OA / pain  HPI: [name], 82 y.o. female, has a history of pain and functional disability in the left hip(s) due to arthritis and patient has failed non-surgical conservative treatments for greater than 12 weeks to include NSAID's and/or analgesics, use of assistive devices and activity modification.  Onset of symptoms was gradual starting 3+ years ago with gradually worsening course since that time.The patient noted no past surgery on the left hip(s).  Patient currently rates pain in the left hip at 8 out of 10 with activity. Patient has night pain, worsening of pain with activity and weight bearing, trendelenberg gait, pain that interfers with activities of daily living and pain with passive range of motion. Patient has evidence of periarticular osteophytes and joint space narrowing by imaging studies. This condition presents safety issues increasing the risk of falls.  There is no current active infection.  Risks, benefits and expectations were discussed with the patient.  Risks including but not limited to the risk of anesthesia, blood clots, nerve damage, blood vessel damage, failure of the prosthesis, infection and up to and including death.  Patient understand the risks, benefits and expectations and wishes to proceed with surgery.   PCP: Chesley Noon, MD  D/C Plans:      Home with HHPT  Post-op Meds:       No Rx given   Tranexamic Acid:      To be given - IV   Decadron:      Is to be given  FYI:     ASA  Norco    Patient Active Problem List   Diagnosis Date Noted  . Chronic venous insufficiency 11/16/2015  . Varicose veins of both lower extremities 11/16/2015  . Edema 05/20/2012  . Bradycardia   . Hypertension   . Hyperlipidemia    Past Medical History  Diagnosis Date  . Hypertension   . Bradycardia   . Chronic cough    . Aortic valve sclerosis   . Edema of lower extremity   . Hyperlipidemia   . OA (osteoarthritis)   . GERD (gastroesophageal reflux disease)   . Renal insufficiency   . Obesity   . Leg pain, left   . Hip pain     left  . Varicose veins     Past Surgical History  Procedure Laterality Date  . Cyst removal from ovary  2002  . Nasal septum surgery    . Cardiovascular stress test  06/25/2006    EF 62%    No prescriptions prior to admission   Allergies  Allergen Reactions  . Ace Inhibitors   . Celebrex [Celecoxib]   . Norvasc [Amlodipine Besylate]     Higher doses   . Vioxx [Rofecoxib]     Social History  Substance Use Topics  . Smoking status: Never Smoker   . Smokeless tobacco: Not on file  . Alcohol Use: No    Family History  Problem Relation Age of Onset  . Diabetes Mother   . Heart disease Father   . Hypertension Sister   . Hypertension Brother      Review of Systems  Constitutional: Negative.   HENT: Negative.   Eyes: Negative.   Respiratory: Negative.   Cardiovascular: Negative.   Gastrointestinal: Positive for heartburn.  Genitourinary: Negative.   Musculoskeletal: Positive for joint pain.  Skin: Negative.  Neurological: Negative.   Endo/Heme/Allergies: Negative.   Psychiatric/Behavioral: Negative.      Objective:  Physical Exam  Constitutional: She is oriented to person, place, and time. She appears well-developed.  HENT:  Head: Normocephalic.  Eyes: Pupils are equal, round, and reactive to light.  Neck: Neck supple. No JVD present. No tracheal deviation present. No thyromegaly present.  Cardiovascular: Normal rate, regular rhythm, normal heart sounds and intact distal pulses.   Respiratory: Effort normal and breath sounds normal. No stridor. No respiratory distress. She has no wheezes.  GI: Soft. There is no tenderness. There is no guarding.  Musculoskeletal:       Left hip: She exhibits decreased range of motion, decreased strength,  tenderness and bony tenderness. She exhibits no swelling, no deformity and no laceration.  Lymphadenopathy:    She has no cervical adenopathy.  Neurological: She is alert and oriented to person, place, and time. A sensory deficit (numbness bilateral LEs) is present.  Skin: Skin is warm and dry.  Psychiatric: She has a normal mood and affect.      Labs:  Estimated body mass index is 31.71 kg/(m^2) as calculated from the following:   Height as of 05/20/12: 5\' 4"  (1.626 m).   Weight as of 05/20/12: 83.825 kg (184 lb 12.8 oz).   Imaging Review Plain radiographs demonstrate severe degenerative joint disease of the left hip(s). The bone quality appears to be good for age and reported activity level.  Assessment/Plan:  End stage arthritis, left hip(s)  The patient history, physical examination, clinical judgement of the provider and imaging studies are consistent with end stage degenerative joint disease of the left hip(s) and total hip arthroplasty is deemed medically necessary. The treatment options including medical management, injection therapy, arthroscopy and arthroplasty were discussed at length. The risks and benefits of total hip arthroplasty were presented and reviewed. The risks due to aseptic loosening, infection, stiffness, dislocation/subluxation,  thromboembolic complications and other imponderables were discussed.  The patient acknowledged the explanation, agreed to proceed with the plan and consent was signed. Patient is being admitted for inpatient treatment for surgery, pain control, PT, OT, prophylactic antibiotics, VTE prophylaxis, progressive ambulation and ADL's and discharge planning.The patient is planning to be discharged home with home health services.      [place] Pugh Mary Guion   PA-C  11/30/2015, 9:58 AM

## 2015-12-11 ENCOUNTER — Encounter (HOSPITAL_COMMUNITY): Payer: Self-pay

## 2015-12-11 ENCOUNTER — Encounter (HOSPITAL_COMMUNITY)
Admission: RE | Admit: 2015-12-11 | Discharge: 2015-12-11 | Disposition: A | Discharge status: Home / Self Care | Payer: Medicare Other | Source: Ambulatory Visit | Attending: Orthopedic Surgery | Admitting: Orthopedic Surgery

## 2015-12-11 DIAGNOSIS — Z0183 Encounter for blood typing: Secondary | ICD-10-CM | POA: Diagnosis not present

## 2015-12-11 DIAGNOSIS — Z01812 Encounter for preprocedural laboratory examination: Secondary | ICD-10-CM | POA: Diagnosis present

## 2015-12-11 DIAGNOSIS — M1612 Unilateral primary osteoarthritis, left hip: Secondary | ICD-10-CM | POA: Insufficient documentation

## 2015-12-11 HISTORY — DX: Personal history of other complications of pregnancy, childbirth and the puerperium: Z87.59

## 2015-12-11 HISTORY — DX: Tinnitus, unspecified ear: H93.19

## 2015-12-11 HISTORY — DX: Personal history of other diseases of the digestive system: Z87.19

## 2015-12-11 HISTORY — DX: Presence of spectacles and contact lenses: Z97.3

## 2015-12-11 HISTORY — DX: Unspecified hearing loss, unspecified ear: H91.90

## 2015-12-11 HISTORY — DX: Personal history of urinary calculi: Z87.442

## 2015-12-11 LAB — ABO/RH: ABO/RH(D): O NEG

## 2015-12-11 LAB — BASIC METABOLIC PANEL
Anion gap: 10 (ref 5–15)
BUN: 30 mg/dL — AB (ref 6–20)
CO2: 28 mmol/L (ref 22–32)
CREATININE: 1.1 mg/dL — AB (ref 0.44–1.00)
Calcium: 10.2 mg/dL (ref 8.9–10.3)
Chloride: 100 mmol/L — ABNORMAL LOW (ref 101–111)
GFR calc Af Amer: 52 mL/min — ABNORMAL LOW (ref >60)
GFR, EST NON AFRICAN AMERICAN: 45 mL/min — AB (ref >60)
GLUCOSE: 103 mg/dL — AB (ref 65–99)
Potassium: 4.3 mmol/L (ref 3.5–5.1)
SODIUM: 138 mmol/L (ref 135–145)

## 2015-12-11 LAB — URINALYSIS, ROUTINE W REFLEX MICROSCOPIC
BILIRUBIN URINE: NEGATIVE
Glucose, UA: NEGATIVE mg/dL
Hgb urine dipstick: NEGATIVE
KETONES UR: NEGATIVE mg/dL
LEUKOCYTES UA: NEGATIVE
NITRITE: NEGATIVE
PH: 6 (ref 5.0–8.0)
Protein, ur: NEGATIVE mg/dL
SPECIFIC GRAVITY, URINE: 1.01 (ref 1.005–1.030)

## 2015-12-11 LAB — PROTIME-INR
INR: 1.04 (ref 0.00–1.49)
PROTHROMBIN TIME: 13.8 s (ref 11.6–15.2)

## 2015-12-11 LAB — APTT: APTT: 37 s (ref 24–37)

## 2015-12-11 LAB — SURGICAL PCR SCREEN
MRSA, PCR: NEGATIVE
STAPHYLOCOCCUS AUREUS: NEGATIVE

## 2015-12-11 NOTE — Patient Instructions (Signed)
[name]  12/11/2015   Your procedure is scheduled on:  Tuesday December 18, 2015   Report to [name] Main  Entrance take [name]  elevators to 3rd floor to  [name] at 9:45 AM.  Call this number if you have problems the morning of surgery [phone]   Remember: ONLY 1 PERSON MAY GO WITH YOU TO SHORT STAY TO GET  READY MORNING OF [name].  Do not eat food or drink liquids :After Midnight.     Take these medicines the morning of surgery with A SIP OF WATER: Alprazolam (Xanax) if needed; Amlodipine (Norvasc); Hydrocodone-Acetaminophen if needed; Eye drops if needed (bring with you day of surgery); Metoprolol                                You may not have any metal on your body including hair pins and              piercings  Do not wear jewelry, make-up, lotions, powders or perfumes, deodorant             Do not wear nail polish.  Do not shave  48 hours prior to surgery.               Do not bring valuables to the hospital. [name].  Contacts, dentures or bridgework may not be worn into surgery.  Leave suitcase in the car. After surgery it may be brought to your room.                Please read over the following fact sheets you were given:MRSA INFORMATION SHEET; INCENTIVE SPIROMETER; BLOOD TRANSFUSION INFORMATION SHEET  _____________________________________________________________________             [name] - Preparing for Surgery Before surgery, you can play an important role.  Because skin is not sterile, your skin needs to be as free of germs as possible.  You can reduce the number of germs on your skin by washing with CHG (chlorahexidine gluconate) soap before surgery.  CHG is an antiseptic cleaner which kills germs and bonds with the skin to continue killing germs even after washing. Please DO NOT use if you have an allergy to CHG or antibacterial soaps.  If your skin becomes  reddened/irritated stop using the CHG and inform your Mary when you arrive at Short Stay. Do not shave (including legs and underarms) for at least 48 hours prior to the first CHG shower.  You may shave your face/neck. Please follow these instructions carefully:  1.  Shower with CHG Soap the night before surgery and the  morning of Surgery.  2.  If you choose to wash your hair, wash your hair first as usual with your  normal  shampoo.  3.  After you shampoo, rinse your hair and body thoroughly to remove the  shampoo.                           4.  Use CHG as you would any other liquid soap.  You can apply chg directly  to the skin and wash  Gently with a scrungie or clean washcloth.  5.  Apply the CHG Soap to your body ONLY FROM THE NECK DOWN.   Do not use on face/ open                           Wound or open sores. Avoid contact with eyes, ears mouth and genitals (private parts).                       Wash face,  Genitals (private parts) with your normal soap.             6.  Wash thoroughly, paying special attention to the area where your surgery  will be performed.  7.  Thoroughly rinse your body with warm water from the neck down.  8.  DO NOT shower/wash with your normal soap after using and rinsing off  the CHG Soap.                9.  Pat yourself dry with a clean towel.            10.  Wear clean pajamas.            11.  Place clean sheets on your bed the night of your first shower and do not  sleep with pets. Day of Surgery : Do not apply any lotions/deodorants the morning of surgery.  Please wear clean clothes to the hospital/surgery Roberson.  FAILURE TO FOLLOW THESE INSTRUCTIONS MAY RESULT IN THE CANCELLATION OF YOUR SURGERY PATIENT SIGNATURE_________________________________  Mary SIGNATURE__________________________________  ________________________________________________________________________   [name]  An incentive spirometer is a tool that  can help keep your lungs clear and active. This tool measures how well you are filling your lungs with each breath. Taking long deep breaths may help reverse or decrease the chance of developing breathing (pulmonary) problems (especially infection) following:  A long period of time when you are unable to move or be active. BEFORE THE PROCEDURE   If the spirometer includes an indicator to show your best effort, your Mary or respiratory therapist will set it to a desired goal.  If possible, sit up straight or lean slightly forward. Try not to slouch.  Hold the incentive spirometer in an upright position. INSTRUCTIONS FOR USE   Sit on the edge of your bed if possible, or sit up as far as you can in bed or on a chair.  Hold the incentive spirometer in an upright position.  Breathe out normally.  Place the mouthpiece in your mouth and seal your lips tightly around it.  Breathe in slowly and as deeply as possible, raising the piston or the ball toward the top of the column.  Hold your breath for 3-5 seconds or for as long as possible. Allow the piston or ball to fall to the bottom of the column.  Remove the mouthpiece from your mouth and breathe out normally.  Rest for a few seconds and repeat Steps 1 through 7 at least 10 times every 1-2 hours when you are awake. Take your time and take a few normal breaths between deep breaths.  The spirometer may include an indicator to show your best effort. Use the indicator as a goal to work toward during each repetition.  After each set of 10 deep breaths, practice coughing to be sure your lungs are clear. If you have an incision (the cut made at the time of surgery),  support your incision when coughing by placing a pillow or rolled up towels firmly against it. Once you are able to get out of bed, walk around indoors and cough well. You may stop using the incentive spirometer when instructed by your caregiver.  RISKS AND COMPLICATIONS  Take your  time so you do not get dizzy or light-headed.  If you are in pain, you may need to take or ask for pain medication before doing incentive spirometry. It is harder to take a deep breath if you are having pain. AFTER USE  Rest and breathe slowly and easily.  It can be helpful to keep track of a log of your progress. Your caregiver can provide you with a simple table to help with this. If you are using the spirometer at home, follow these instructions: [place] IF:   You are having difficultly using the spirometer.  You have trouble using the spirometer as often as instructed.  Your pain medication is not giving enough relief while using the spirometer.  You develop fever of 100.5 F (38.1 C) or higher. SEEK IMMEDIATE MEDICAL CARE IF:   You cough up bloody sputum that had not been present before.  You develop fever of 102 F (38.9 C) or greater.  You develop worsening pain at or near the incision site. MAKE SURE YOU:   Understand these instructions.  Will watch your condition.  Will get help right away if you are not doing well or get worse. Document Released: 03/09/2007 Document Revised: 01/19/2012 Document Reviewed: 05/10/2007 Exit[employer] Patient Information 2014 [employer], [place].   ________________________________________________________________________  WHAT IS A BLOOD TRANSFUSION? Blood Transfusion Information  A transfusion is the replacement of blood or some of its parts. Blood is made up of multiple cells which provide different functions.  Red blood cells carry oxygen and are used for blood loss replacement.  White blood cells fight against infection.  Platelets control bleeding.  Plasma helps clot blood.  Other blood products are available for specialized needs, such as hemophilia or other clotting disorders. BEFORE THE TRANSFUSION  Who gives blood for transfusions?   Healthy volunteers who are fully evaluated to make sure their blood is safe. This  is blood bank blood. Transfusion therapy is the safest it has ever been in the practice of medicine. Before blood is taken from a donor, a complete history is taken to make sure that person has no history of diseases nor engages in risky social behavior (examples are intravenous drug use or sexual activity with multiple partners). The donor's travel history is screened to minimize risk of transmitting infections, such as malaria. The donated blood is tested for signs of infectious diseases, such as HIV and hepatitis. The blood is then tested to be sure it is compatible with you in order to minimize the chance of a transfusion reaction. If you or a relative donates blood, this is often done in anticipation of surgery and is not appropriate for emergency situations. It takes many days to process the donated blood. RISKS AND COMPLICATIONS Although transfusion therapy is very safe and saves many lives, the main dangers of transfusion include:   Getting an infectious disease.  Developing a transfusion reaction. This is an allergic reaction to something in the blood you were given. Every precaution is taken to prevent this. The decision to have a blood transfusion has been considered carefully by your caregiver before blood is given. Blood is not given unless the benefits outweigh the risks. AFTER THE TRANSFUSION  Right after receiving a blood transfusion, you will usually feel much better and more energetic. This is especially true if your red blood cells have gotten low (anemic). The transfusion raises the level of the red blood cells which carry oxygen, and this usually causes an energy increase.  The Mary administering the transfusion will monitor you carefully for complications. HOME CARE INSTRUCTIONS  No special instructions are needed after a transfusion. You may find your energy is better. Speak with your caregiver about any limitations on activity for underlying diseases you may have. SEEK  MEDICAL CARE IF:   Your condition is not improving after your transfusion.  You develop redness or irritation at the intravenous (IV) site. SEEK IMMEDIATE MEDICAL CARE IF:  Any of the following symptoms occur over the next 12 hours:  Shaking chills.  You have a temperature by mouth above 102 F (38.9 C), not controlled by medicine.  Chest, back, or muscle pain.  People around you feel you are not acting correctly or are confused.  Shortness of breath or difficulty breathing.  Dizziness and fainting.  You get a rash or develop hives.  You have a decrease in urine output.  Your urine turns a dark color or changes to pink, red, or brown. Any of the following symptoms occur over the next 10 days:  You have a temperature by mouth above 102 F (38.9 C), not controlled by medicine.  Shortness of breath.  Weakness after normal activity.  The white part of the eye turns yellow (jaundice).  You have a decrease in the amount of urine or are urinating less often.  Your urine turns a dark color or changes to pink, red, or brown. Document Released: 10/24/2000 Document Revised: 01/19/2012 Document Reviewed: 06/12/2008 [facility] Patient Information 2014 [place], [place].  _______________________________________________________________________

## 2015-12-11 NOTE — Progress Notes (Signed)
CBCD results per chart 11/22/2015  Clearance per Dr Renaldo/cardiology per chart 11/15/2015  EKG per chart 11/15/2015  ECHO results per chart 11/29/2015

## 2015-12-18 ENCOUNTER — Inpatient Hospital Stay (HOSPITAL_COMMUNITY): Payer: Medicare Other | Admitting: Anesthesiology

## 2015-12-18 ENCOUNTER — Inpatient Hospital Stay (HOSPITAL_COMMUNITY)
Admission: RE | Admit: 2015-12-18 | Discharge: 2015-12-20 | DRG: 470 | Disposition: A | Discharge status: Home Health | Payer: Medicare Other | Source: Ambulatory Visit | Attending: Orthopedic Surgery | Admitting: Orthopedic Surgery

## 2015-12-18 ENCOUNTER — Inpatient Hospital Stay (HOSPITAL_COMMUNITY): Payer: Medicare Other

## 2015-12-18 ENCOUNTER — Encounter (HOSPITAL_COMMUNITY): Payer: Self-pay | Admitting: *Deleted

## 2015-12-18 ENCOUNTER — Encounter (HOSPITAL_COMMUNITY)
Admission: RE | Disposition: A | Discharge status: Home Health | Payer: Self-pay | Source: Ambulatory Visit | Attending: Orthopedic Surgery

## 2015-12-18 DIAGNOSIS — Z96649 Presence of unspecified artificial hip joint: Secondary | ICD-10-CM

## 2015-12-18 DIAGNOSIS — M1612 Unilateral primary osteoarthritis, left hip: Secondary | ICD-10-CM | POA: Diagnosis present

## 2015-12-18 DIAGNOSIS — E663 Overweight: Secondary | ICD-10-CM | POA: Diagnosis present

## 2015-12-18 DIAGNOSIS — I872 Venous insufficiency (chronic) (peripheral): Secondary | ICD-10-CM | POA: Diagnosis present

## 2015-12-18 DIAGNOSIS — Z6828 Body mass index (BMI) 28.0-28.9, adult: Secondary | ICD-10-CM

## 2015-12-18 DIAGNOSIS — I10 Essential (primary) hypertension: Secondary | ICD-10-CM | POA: Diagnosis present

## 2015-12-18 DIAGNOSIS — M25552 Pain in left hip: Secondary | ICD-10-CM | POA: Diagnosis present

## 2015-12-18 DIAGNOSIS — Z01812 Encounter for preprocedural laboratory examination: Secondary | ICD-10-CM

## 2015-12-18 HISTORY — PX: TOTAL HIP ARTHROPLASTY: SHX124

## 2015-12-18 LAB — TYPE AND SCREEN
ABO/RH(D): O NEG
Antibody Screen: NEGATIVE

## 2015-12-18 SURGERY — ARTHROPLASTY, HIP, TOTAL, ANTERIOR APPROACH
Anesthesia: Spinal | Site: Hip | Laterality: Left

## 2015-12-18 MED ORDER — HYDROMORPHONE HCL 1 MG/ML IJ SOLN
INTRAMUSCULAR | Status: AC
  Filled 2015-12-18: qty 1

## 2015-12-18 MED ORDER — ZOLPIDEM TARTRATE 5 MG PO TABS: 5.0000 mg | ORAL_TABLET | Freq: Every evening | ORAL | Status: DC | PRN

## 2015-12-18 MED ORDER — FENTANYL CITRATE (PF) 100 MCG/2ML IJ SOLN
INTRAMUSCULAR | Status: AC
  Filled 2015-12-18: qty 2

## 2015-12-18 MED ORDER — PROPOFOL 10 MG/ML IV BOLUS
INTRAVENOUS | Status: DC | PRN
  Administered 2015-12-18: 20 mg via INTRAVENOUS
  Administered 2015-12-18: 30 mg via INTRAVENOUS

## 2015-12-18 MED ORDER — DEXAMETHASONE SODIUM PHOSPHATE 10 MG/ML IJ SOLN
10.0000 mg | Freq: Once | INTRAMUSCULAR | Status: DC
  Filled 2015-12-18: qty 1

## 2015-12-18 MED ORDER — ONDANSETRON HCL 4 MG PO TABS: 4.0000 mg | ORAL_TABLET | Freq: Four times a day (QID) | ORAL | Status: DC | PRN

## 2015-12-18 MED ORDER — METHOCARBAMOL 500 MG PO TABS
500.0000 mg | ORAL_TABLET | Freq: Four times a day (QID) | ORAL | Status: DC | PRN
  Administered 2015-12-19 – 2015-12-20 (×4): 500 mg via ORAL
  Filled 2015-12-18 (×4): qty 1

## 2015-12-18 MED ORDER — CHLORHEXIDINE GLUCONATE 4 % EX LIQD: 60.0000 mL | Freq: Once | CUTANEOUS | Status: DC

## 2015-12-18 MED ORDER — METOCLOPRAMIDE HCL 5 MG/ML IJ SOLN: 5.0000 mg | Freq: Three times a day (TID) | INTRAMUSCULAR | Status: DC | PRN

## 2015-12-18 MED ORDER — SODIUM CHLORIDE 0.9 % IR SOLN
Status: DC | PRN
  Administered 2015-12-18: 1000 mL

## 2015-12-18 MED ORDER — LACTATED RINGERS IV SOLN
INTRAVENOUS | Status: DC
  Administered 2015-12-18: 1000 mL via INTRAVENOUS

## 2015-12-18 MED ORDER — HYDROCODONE-ACETAMINOPHEN 7.5-325 MG PO TABS
1.0000 | ORAL_TABLET | ORAL | Status: DC
  Administered 2015-12-18 – 2015-12-20 (×10): 1 via ORAL
  Filled 2015-12-18 (×10): qty 1

## 2015-12-18 MED ORDER — POLYETHYLENE GLYCOL 3350 17 G PO PACK
17.0000 g | PACK | Freq: Two times a day (BID) | ORAL | Status: DC
  Administered 2015-12-18 – 2015-12-19 (×2): 17 g via ORAL

## 2015-12-18 MED ORDER — MEPERIDINE HCL 50 MG/ML IJ SOLN: 6.2500 mg | INTRAMUSCULAR | Status: DC | PRN

## 2015-12-18 MED ORDER — OXYCODONE HCL 5 MG PO TABS: 5.0000 mg | ORAL_TABLET | Freq: Once | ORAL | Status: DC | PRN

## 2015-12-18 MED ORDER — CEFAZOLIN SODIUM-DEXTROSE 2-3 GM-% IV SOLR
INTRAVENOUS | Status: AC
  Filled 2015-12-18: qty 50

## 2015-12-18 MED ORDER — ALPRAZOLAM 0.25 MG PO TABS
0.2500 mg | ORAL_TABLET | Freq: Three times a day (TID) | ORAL | Status: DC | PRN
  Administered 2015-12-18: 0.25 mg via ORAL
  Filled 2015-12-18: qty 1

## 2015-12-18 MED ORDER — METOCLOPRAMIDE HCL 10 MG PO TABS: 5.0000 mg | ORAL_TABLET | Freq: Three times a day (TID) | ORAL | Status: DC | PRN

## 2015-12-18 MED ORDER — POTASSIUM CHLORIDE 2 MEQ/ML IV SOLN
100.0000 mL/h | INTRAVENOUS | Status: DC
  Administered 2015-12-18: 100 mL/h via INTRAVENOUS
  Filled 2015-12-18 (×4): qty 1000

## 2015-12-18 MED ORDER — TRANEXAMIC ACID 1000 MG/10ML IV SOLN
1000.0000 mg | Freq: Once | INTRAVENOUS | Status: AC
  Administered 2015-12-18: 1000 mg via INTRAVENOUS
  Filled 2015-12-18: qty 10

## 2015-12-18 MED ORDER — DIPHENHYDRAMINE HCL 25 MG PO CAPS: 25.0000 mg | ORAL_CAPSULE | Freq: Four times a day (QID) | ORAL | Status: DC | PRN

## 2015-12-18 MED ORDER — POLYVINYL ALCOHOL 1.4 % OP SOLN
1.0000 [drp] | Freq: Three times a day (TID) | OPHTHALMIC | Status: DC | PRN
  Filled 2015-12-18: qty 15

## 2015-12-18 MED ORDER — AMLODIPINE BESYLATE 5 MG PO TABS
5.0000 mg | ORAL_TABLET | Freq: Every day | ORAL | Status: DC
  Administered 2015-12-19 – 2015-12-20 (×2): 5 mg via ORAL
  Filled 2015-12-18 (×2): qty 1

## 2015-12-18 MED ORDER — LACTATED RINGERS IV SOLN
INTRAVENOUS | Status: DC | PRN
  Administered 2015-12-18 (×3): via INTRAVENOUS

## 2015-12-18 MED ORDER — HYDROMORPHONE HCL 1 MG/ML IJ SOLN
0.2500 mg | INTRAMUSCULAR | Status: DC | PRN
  Administered 2015-12-18 (×3): 0.25 mg via INTRAVENOUS

## 2015-12-18 MED ORDER — PROMETHAZINE HCL 25 MG/ML IJ SOLN: 6.2500 mg | INTRAMUSCULAR | Status: DC | PRN

## 2015-12-18 MED ORDER — PROPOFOL 500 MG/50ML IV EMUL
INTRAVENOUS | Status: DC | PRN
  Administered 2015-12-18: 50 ug/kg/min via INTRAVENOUS

## 2015-12-18 MED ORDER — HYDROMORPHONE HCL 1 MG/ML IJ SOLN
0.5000 mg | INTRAMUSCULAR | Status: DC | PRN
  Administered 2015-12-18: 0.5 mg via INTRAVENOUS
  Filled 2015-12-18: qty 1

## 2015-12-18 MED ORDER — VALSARTAN-HYDROCHLOROTHIAZIDE 320-25 MG PO TABS: 1.0000 | ORAL_TABLET | Freq: Every day | ORAL | Status: DC

## 2015-12-18 MED ORDER — ONDANSETRON HCL 4 MG/2ML IJ SOLN
INTRAMUSCULAR | Status: AC
  Filled 2015-12-18: qty 2

## 2015-12-18 MED ORDER — BISACODYL 10 MG RE SUPP: 10.0000 mg | Freq: Every day | RECTAL | Status: DC | PRN

## 2015-12-18 MED ORDER — HYDROCHLOROTHIAZIDE 25 MG PO TABS
25.0000 mg | ORAL_TABLET | Freq: Every day | ORAL | Status: DC
  Administered 2015-12-19 – 2015-12-20 (×2): 25 mg via ORAL
  Filled 2015-12-18 (×3): qty 1

## 2015-12-18 MED ORDER — FERROUS SULFATE 325 (65 FE) MG PO TABS
325.0000 mg | ORAL_TABLET | Freq: Three times a day (TID) | ORAL | Status: DC
  Administered 2015-12-18 – 2015-12-20 (×3): 325 mg via ORAL
  Filled 2015-12-18 (×8): qty 1

## 2015-12-18 MED ORDER — DEXAMETHASONE SODIUM PHOSPHATE 10 MG/ML IJ SOLN
10.0000 mg | Freq: Once | INTRAMUSCULAR | Status: AC
  Administered 2015-12-18: 10 mg via INTRAVENOUS

## 2015-12-18 MED ORDER — METOPROLOL TARTRATE 12.5 MG HALF TABLET
12.5000 mg | ORAL_TABLET | Freq: Two times a day (BID) | ORAL | Status: DC
  Administered 2015-12-19: 12.5 mg via ORAL
  Filled 2015-12-18 (×5): qty 1

## 2015-12-18 MED ORDER — BUPIVACAINE IN DEXTROSE 0.75-8.25 % IT SOLN
INTRATHECAL | Status: DC | PRN
  Administered 2015-12-18: 1.8 mL via INTRATHECAL

## 2015-12-18 MED ORDER — ASPIRIN EC 325 MG PO TBEC
325.0000 mg | DELAYED_RELEASE_TABLET | Freq: Two times a day (BID) | ORAL | Status: DC
Start: 2015-12-19 — End: 2015-12-20
  Administered 2015-12-19 – 2015-12-20 (×3): 325 mg via ORAL
  Filled 2015-12-18 (×5): qty 1

## 2015-12-18 MED ORDER — MAGNESIUM CITRATE PO SOLN: 1.0000 | Freq: Once | ORAL | Status: DC | PRN

## 2015-12-18 MED ORDER — ONDANSETRON HCL 4 MG/2ML IJ SOLN: 4.0000 mg | Freq: Four times a day (QID) | INTRAMUSCULAR | Status: DC | PRN

## 2015-12-18 MED ORDER — PHENOL 1.4 % MT LIQD: 1.0000 | OROMUCOSAL | Status: DC | PRN

## 2015-12-18 MED ORDER — DOCUSATE SODIUM 100 MG PO CAPS
100.0000 mg | ORAL_CAPSULE | Freq: Two times a day (BID) | ORAL | Status: DC
  Administered 2015-12-18 – 2015-12-20 (×4): 100 mg via ORAL

## 2015-12-18 MED ORDER — ALUM & MAG HYDROXIDE-SIMETH 200-200-20 MG/5ML PO SUSP: 30.0000 mL | ORAL | Status: DC | PRN

## 2015-12-18 MED ORDER — CEFAZOLIN SODIUM-DEXTROSE 2-3 GM-% IV SOLR
2.0000 g | Freq: Four times a day (QID) | INTRAVENOUS | Status: AC
  Administered 2015-12-18 – 2015-12-19 (×2): 2 g via INTRAVENOUS
  Filled 2015-12-18 (×2): qty 50

## 2015-12-18 MED ORDER — METHOCARBAMOL 1000 MG/10ML IJ SOLN
500.0000 mg | Freq: Four times a day (QID) | INTRAMUSCULAR | Status: DC | PRN
  Administered 2015-12-18: 500 mg via INTRAVENOUS
  Filled 2015-12-18 (×2): qty 5

## 2015-12-18 MED ORDER — FENTANYL CITRATE (PF) 100 MCG/2ML IJ SOLN
INTRAMUSCULAR | Status: DC | PRN
  Administered 2015-12-18: 50 ug via INTRAVENOUS

## 2015-12-18 MED ORDER — IRBESARTAN 300 MG PO TABS
300.0000 mg | ORAL_TABLET | Freq: Every day | ORAL | Status: DC
  Administered 2015-12-19 – 2015-12-20 (×2): 300 mg via ORAL
  Filled 2015-12-18 (×3): qty 1

## 2015-12-18 MED ORDER — OXYCODONE HCL 5 MG/5ML PO SOLN
5.0000 mg | Freq: Once | ORAL | Status: DC | PRN
  Filled 2015-12-18: qty 5

## 2015-12-18 MED ORDER — PROPOFOL 10 MG/ML IV BOLUS
INTRAVENOUS | Status: AC
  Filled 2015-12-18: qty 60

## 2015-12-18 MED ORDER — MENTHOL 3 MG MT LOZG: 1.0000 | LOZENGE | OROMUCOSAL | Status: DC | PRN

## 2015-12-18 MED ORDER — DEXAMETHASONE SODIUM PHOSPHATE 10 MG/ML IJ SOLN
INTRAMUSCULAR | Status: AC
  Filled 2015-12-18: qty 1

## 2015-12-18 MED ORDER — CLONIDINE HCL 0.1 MG PO TABS
0.1000 mg | ORAL_TABLET | Freq: Every day | ORAL | Status: DC
  Administered 2015-12-19: 0.1 mg via ORAL
  Filled 2015-12-18 (×3): qty 1

## 2015-12-18 MED ORDER — CEFAZOLIN SODIUM-DEXTROSE 2-3 GM-% IV SOLR
2.0000 g | INTRAVENOUS | Status: AC
  Administered 2015-12-18: 2 g via INTRAVENOUS

## 2015-12-18 SURGICAL SUPPLY — 34 items
BAG DECANTER FOR FLEXI CONT (MISCELLANEOUS) IMPLANT
BAG SPEC THK2 15X12 ZIP CLS (MISCELLANEOUS)
BAG ZIPLOCK 12X15 (MISCELLANEOUS) IMPLANT
CAPT HIP TOTAL 2 ×2 IMPLANT
CLOTH BEACON ORANGE TIMEOUT ST (SAFETY) ×3 IMPLANT
COVER PERINEAL POST (MISCELLANEOUS) ×3 IMPLANT
DRAPE STERI IOBAN 125X83 (DRAPES) ×3 IMPLANT
DRAPE U-SHAPE 47X51 STRL (DRAPES) ×6 IMPLANT
DRSG AQUACEL AG ADV 3.5X10 (GAUZE/BANDAGES/DRESSINGS) ×3 IMPLANT
DURAPREP 26ML APPLICATOR (WOUND CARE) ×3 IMPLANT
ELECT REM PT RETURN 15FT ADLT (MISCELLANEOUS) ×2 IMPLANT
ELECT REM PT RETURN 9FT ADLT (ELECTROSURGICAL) ×3
ELECTRODE REM PT RTRN 9FT ADLT (ELECTROSURGICAL) ×1 IMPLANT
GLOVE BIOGEL M 7.0 STRL (GLOVE) IMPLANT
GLOVE BIOGEL PI IND STRL 7.5 (GLOVE) ×1 IMPLANT
GLOVE BIOGEL PI IND STRL 8.5 (GLOVE) ×1 IMPLANT
GLOVE BIOGEL PI INDICATOR 7.5 (GLOVE) ×2
GLOVE BIOGEL PI INDICATOR 8.5 (GLOVE) ×2
GLOVE ECLIPSE 8.0 STRL XLNG CF (GLOVE) ×6 IMPLANT
GLOVE ORTHO TXT STRL SZ7.5 (GLOVE) ×3 IMPLANT
GOWN STRL REUS W/TWL LRG LVL3 (GOWN DISPOSABLE) ×3 IMPLANT
GOWN STRL REUS W/TWL XL LVL3 (GOWN DISPOSABLE) ×3 IMPLANT
HOLDER FOLEY CATH W/STRAP (MISCELLANEOUS) ×3 IMPLANT
LIQUID BAND (GAUZE/BANDAGES/DRESSINGS) ×3 IMPLANT
PACK ANTERIOR HIP CUSTOM (KITS) ×3 IMPLANT
SAW OSC TIP CART 19.5X105X1.3 (SAW) ×3 IMPLANT
SUT MNCRL AB 4-0 PS2 18 (SUTURE) ×3 IMPLANT
SUT VIC AB 1 CT1 36 (SUTURE) ×9 IMPLANT
SUT VIC AB 2-0 CT1 27 (SUTURE) ×6
SUT VIC AB 2-0 CT1 TAPERPNT 27 (SUTURE) ×2 IMPLANT
SUT VLOC 180 0 24IN GS25 (SUTURE) ×3 IMPLANT
TRAY FOLEY W/METER SILVER 14FR (SET/KITS/TRAYS/PACK) IMPLANT
WATER STERILE IRR 1500ML POUR (IV SOLUTION) ×3 IMPLANT
YANKAUER SUCT BULB TIP 10FT TU (MISCELLANEOUS) IMPLANT

## 2015-12-18 NOTE — Anesthesia Procedure Notes (Signed)
Spinal Patient location during procedure: OR Start time: 12/18/2015 12:30 PM End time: 12/18/2015 12:38 PM Reason for block: at surgeon's request Staffing Anesthesiologist: CREWS, DAVID Resident/CRNA: Anne Fu Performed by: resident/CRNA and anesthesiologist  Preanesthetic Checklist Completed: patient identified, site marked, surgical consent, pre-op evaluation, timeout performed, IV checked, risks and benefits discussed, monitors and equipment checked and at surgeon's request Spinal Block Patient position: sitting Approach: right paramedian Location: L2-3 Injection technique: single-shot Needle Needle type: Whitacre and Spinocan  Needle gauge: 22 G Needle length: 9 cm Assessment Sensory level: T6 Additional Notes Expiration date of kit checked and confirmed. Patient tolerated procedure well, without complications. X 2  attempt with noted clear CSF return.  First per [facility] CRNA without success, MD Crew placed with noed loss of motor and sensory on exam post injection.

## 2015-12-18 NOTE — Anesthesia Postprocedure Evaluation (Signed)
Anesthesia Post Note  Patient: [name]  Procedure(s) Performed: Procedure(s) (LRB): LEFT TOTAL HIP ARTHROPLASTY ANTERIOR APPROACH (Left)  Patient location during evaluation: PACU Anesthesia Type: General Level of consciousness: awake and alert Pain management: pain level controlled Vital Signs Assessment: post-procedure vital signs reviewed and stable Respiratory status: spontaneous breathing, nonlabored ventilation, respiratory function stable and patient connected to nasal cannula oxygen Cardiovascular status: blood pressure returned to baseline and stable Postop Assessment: no signs of nausea or vomiting Anesthetic complications: no    Last Vitals:  Filed Vitals:   12/18/15 1430 12/18/15 1445  BP: 117/38 116/50  Pulse: 62 58  Temp:    Resp: 19 20    Last Pain:  Filed Vitals:   12/18/15 1448  PainSc: 0-No pain                 Bashir Marchetti A

## 2015-12-18 NOTE — Discharge Instructions (Signed)

## 2015-12-18 NOTE — Op Note (Deleted)
NAME:  [name] NO.: [id number]      MEDICAL RECORD NO.: [id number]      FACILITY:  [facility]      PHYSICIAN:  Paralee Cancel D  DATE OF BIRTH:  12-23-1929     DATE OF PROCEDURE:  12/18/2015                                 OPERATIVE REPORT         PREOPERATIVE DIAGNOSIS: Left  hip osteoarthritis.      POSTOPERATIVE DIAGNOSIS:  Left hip osteoarthritis.      PROCEDURE:  Left total hip replacement through an anterior approach   utilizing DePuy THR system, component size 32mm pinnacle cup, a size 36+4 neutral   Altrex liner, a size 6 Hi Tri Lock stem with a 36+1.5 delta ceramic   ball.      SURGEON:  Pietro Cassis. Alvan Dame, M.D.      ASSISTANT:  Nehemiah Massed, PA-C      ANESTHESIA:  Spinal.      SPECIMENS:  None.      COMPLICATIONS:  None.      BLOOD LOSS:  200 cc     DRAINS:  None      INDICATION OF THE PROCEDURE:  [name] is a 82 y.o. female who had   presented to office for evaluation of left hip pain.  Radiographs revealed   progressive degenerative changes with bone-on-bone   articulation to the  hip joint.  The patient had painful limited range of   motion significantly affecting their overall quality of life.  The patient was failing to    respond to conservative measures, and at this point was ready   to proceed with more definitive measures.  The patient has noted progressive   degenerative changes in his hip, progressive problems and dysfunction   with regarding the hip prior to surgery.  Consent was obtained for   benefit of pain relief.  Specific risk of infection, DVT, component   failure, dislocation, need for revision surgery, as well discussion of   the anterior versus posterior approach were reviewed.  Consent was   obtained for benefit of anterior pain relief through an anterior   approach.      PROCEDURE IN DETAIL:  The patient was brought to operative theater.   Once adequate anesthesia, preoperative  antibiotics, 2gm of Ancef, 1 gm of Tranexamic Acid, and 10 mg of Decadron administered.   The patient was positioned supine on the OSI Hanna table.  Once adequate   padding of boney process was carried out, we had predraped out the hip, and  used fluoroscopy to confirm orientation of the pelvis and position.      The left hip was then prepped and draped from proximal iliac crest to   mid thigh with shower curtain technique.      Time-out was performed identifying the patient, planned procedure, and   extremity.     An incision was then made 2 cm distal and lateral to the   anterior superior iliac spine extending over the orientation of the   tensor fascia lata muscle and sharp dissection was carried down to the   fascia of the muscle and protractor placed in the soft tissues.      The fascia  was then incised.  The muscle belly was identified and swept   laterally and retractor placed along the superior neck.  Following   cauterization of the circumflex vessels and removing some pericapsular   fat, a second cobra retractor was placed on the inferior neck.  A third   retractor was placed on the anterior acetabulum after elevating the   anterior rectus.  A L-capsulotomy was along the line of the   superior neck to the trochanteric fossa, then extended proximally and   distally.  Tag sutures were placed and the retractors were then placed   intracapsular.  We then identified the trochanteric fossa and   orientation of my neck cut, confirmed this radiographically   and then made a neck osteotomy with the femur on traction.  The femoral   head was removed without difficulty or complication.  Traction was let   off and retractors were placed posterior and anterior around the   acetabulum.      The labrum and foveal tissue were debrided.  I began reaming with a 72mm    reamer and reamed up to 15mm reamer with good bony bed preparation and a 70mm   cup was chosen.  The final 37mm Pinnacle cup  was then impacted under fluoroscopy  to confirm the depth of penetration and orientation with respect to   abduction.  A screw was placed followed by the hole eliminator.  The final   36+4 neutral Altrex liner was impacted with good visualized rim fit.  The cup was positioned anatomically within the acetabular portion of the pelvis.      At this point, the femur was rolled at 80 degrees.  Further capsule was   released off the inferior aspect of the femoral neck.  I then   released the superior capsule proximally.  The hook was placed laterally   along the femur and elevated manually and held in position with the bed   hook.  The leg was then extended and adducted with the leg rolled to 100   degrees of external rotation.  Once the proximal femur was fully   exposed, I used a box osteotome to set orientation.  I then began   broaching with the starting chili pepper broach and passed this by hand and then broached up to 6 which matches the other hip.  With the 6 broach in place I chose a high offset neck and did a trial reduction.  The offset was appropriate, leg lengths   appeared to be equal, confirmed radiographically.   Given these findings, I went ahead and dislocated the hip, repositioned all   retractors and positioned the right hip in the extended and abducted position.  The final 6 Hi Tri Lock stem was   chosen and it was impacted down to the level of neck cut.  Based on this   and the trial reduction, a 36+1.5 delta ceramic ball was chosen and   impacted onto a clean and dry trunnion, and the hip was reduced.  The   hip had been irrigated throughout the case again at this point.  I did   reapproximate the superior capsular leaflet to the anterior leaflet   using #1 Vicryl  The fascia of the   tensor fascia lata muscle was then reapproximated using #1 Vicryl and #0 V-lock sutures.  The   remaining wound was closed with 2-0 Vicryl and running 4-0 Monocryl.   The hip was cleaned, dried,  and  dressed sterilely using Dermabond and   Aquacel dressing.  He was then brought   to recovery room in stable condition tolerating the procedure well.    Nehemiah Massed, PA-C was present for the entirety of the case involved from   preoperative positioning, perioperative retractor management, general   facilitation of the case, as well as primary wound closure as assistant.            Pietro Cassis Alvan Dame, M.D.        12/18/2015 12:29 PM

## 2015-12-18 NOTE — Progress Notes (Signed)
[facility] flor notified pt will be in room in 20 minutes.

## 2015-12-18 NOTE — Anesthesia Preprocedure Evaluation (Addendum)
Anesthesia Evaluation  Patient identified by MRN, date of birth, ID band Patient awake    Reviewed: Allergy & Precautions, NPO status , Patient's Chart, lab work & pertinent test results  Airway Mallampati: I  TM Distance: >3 FB Neck ROM: Full    Dental  (+) Teeth Intact, Dental Advisory Given   Pulmonary    breath sounds clear to auscultation       Cardiovascular hypertension, Pt. on medications and Pt. on home beta blockers + Valvular Problems/Murmurs (Non rheumatic on echol 11/27/15. [facility] at [place]. Dr. Melford Aase.) AI and MR  Rhythm:Regular Rate:Normal     Neuro/Psych    GI/Hepatic   Endo/Other    Renal/GU      Musculoskeletal   Abdominal   Peds  Hematology   Anesthesia Other Findings No murmur heard  Reproductive/Obstetrics                            Anesthesia Physical Anesthesia Plan  ASA: III  Anesthesia Plan: Spinal   Post-op Pain Management:    Induction: Intravenous  Airway Management Planned: Simple Face Mask  Additional Equipment:   Intra-op Plan:   Post-operative Plan:   Informed Consent: I have reviewed the patients History and Physical, chart, labs and discussed the procedure including the risks, benefits and alternatives for the proposed anesthesia with the patient or authorized representative who has indicated his/her understanding and acceptance.   Dental advisory given  Plan Discussed with: CRNA, Surgeon and Anesthesiologist  Anesthesia Plan Comments:         Anesthesia Quick Evaluation

## 2015-12-18 NOTE — Progress Notes (Signed)
Utilization review completed.  

## 2015-12-18 NOTE — Transfer of Care (Signed)
Immediate Anesthesia Transfer of Care Note  Patient: [name]  Procedure(s) Performed: Procedure(s): LEFT TOTAL HIP ARTHROPLASTY ANTERIOR APPROACH (Left)  Patient Location: PACU  Anesthesia Type:Spinal  Level of Consciousness:  sedated, patient cooperative and responds to stimulation  Airway & Oxygen Therapy:Patient Spontanous Breathing and Patient connected to face mask oxgen  Post-op Assessment:  Report given to PACU RN and Post -op Vital signs reviewed and stable  Post vital signs:  Reviewed and stable  Last Vitals:  Filed Vitals:   12/18/15 0942  BP: 113/61  Pulse: 65  Temp: 36.6 C  Resp: 16    Complications: No apparent anesthesia complications [id number] level on exam.

## 2015-12-18 NOTE — Interval H&P Note (Signed)
History and Physical Interval Note:  12/18/2015 11:28 AM  [name]  has presented today for surgery, with the diagnosis of LEFT HIP OA  The various methods of treatment have been discussed with the patient and family. After consideration of risks, benefits and other options for treatment, the patient has consented to  Procedure(s): LEFT TOTAL HIP ARTHROPLASTY ANTERIOR APPROACH (Left) as a surgical intervention .  The patient's history has been reviewed, patient examined, no change in status, stable for surgery.  I have reviewed the patient's chart and labs.  Questions were answered to the patient's satisfaction.     Mauri Pole

## 2015-12-18 NOTE — Op Note (Signed)
NAME:  [name] NO.: [id number]      MEDICAL RECORD NO.: [id number]      FACILITY:  [facility]      PHYSICIAN:  Paralee Cancel D  DATE OF BIRTH:  May 11, 1930     DATE OF PROCEDURE:  12/18/2015                                 OPERATIVE REPORT         PREOPERATIVE DIAGNOSIS: Left  hip osteoarthritis.      POSTOPERATIVE DIAGNOSIS:  Left hip osteoarthritis.      PROCEDURE:  Left total hip replacement through an anterior approach   utilizing DePuy THR system, component size 19mm pinnacle cup, a size 32+4 neutral   Altrex liner, a size 1 Hi Tri Lock stem with a 32+5 delta ceramic   ball.      SURGEON:  Pietro Cassis. Alvan Dame, M.D.      ASSISTANT:  Danae Orleans, PA-C     ANESTHESIA:  Spinal.      SPECIMENS:  None.      COMPLICATIONS:  None.      BLOOD LOSS:  300 cc     DRAINS:  None      INDICATION OF THE PROCEDURE:  [name] is a 82 y.o. female who had   presented to office for evaluation of left hip pain.  Radiographs revealed   progressive degenerative changes with bone-on-bone   articulation to the  hip joint.  The patient had painful limited range of   motion significantly affecting their overall quality of life.  The patient was failing to    respond to conservative measures, and at this point was ready   to proceed with more definitive measures.  The patient has noted progressive   degenerative changes in his hip, progressive problems and dysfunction   with regarding the hip prior to surgery.  Consent was obtained for   benefit of pain relief.  Specific risk of infection, DVT, component   failure, dislocation, need for revision surgery, as well discussion of   the anterior versus posterior approach were reviewed.  Consent was   obtained for benefit of anterior pain relief through an anterior   approach.      PROCEDURE IN DETAIL:  The patient was brought to operative theater.   Once adequate anesthesia, preoperative  antibiotics, 2gm of Ancef, 1 gm of Tranexamic Acid, and 10 mg of Decadron administered.   The patient was positioned supine on the OSI Hanna table.  Once adequate   padding of boney process was carried out, we had predraped out the hip, and  used fluoroscopy to confirm orientation of the pelvis and position.      The left hip was then prepped and draped from proximal iliac crest to   mid thigh with shower curtain technique.      Time-out was performed identifying the patient, planned procedure, and   extremity.     An incision was then made 2 cm distal and lateral to the   anterior superior iliac spine extending over the orientation of the   tensor fascia lata muscle and sharp dissection was carried down to the   fascia of the muscle and protractor placed in the soft tissues.      The fascia was  then incised.  The muscle belly was identified and swept   laterally and retractor placed along the superior neck.  Following   cauterization of the circumflex vessels and removing some pericapsular   fat, a second cobra retractor was placed on the inferior neck.  A third   retractor was placed on the anterior acetabulum after elevating the   anterior rectus.  A L-capsulotomy was along the line of the   superior neck to the trochanteric fossa, then extended proximally and   distally.  Tag sutures were placed and the retractors were then placed   intracapsular.  We then identified the trochanteric fossa and   orientation of my neck cut, confirmed this radiographically   and then made a neck osteotomy with the femur on traction.  The femoral   head was removed without difficulty or complication.  Traction was let   off and retractors were placed posterior and anterior around the   acetabulum.      The labrum and foveal tissue were debrided.  I began reaming with a 63mm   reamer and reamed up to 39mm reamer with good bony bed preparation and a 61mm   cup was chosen.  The final 35mm Pinnacle cup  was then impacted under fluoroscopy  to confirm the depth of penetration and orientation with respect to   abduction.  A screw was placed followed by the hole eliminator.  The final   32+4 neutral Altrex liner was impacted with good visualized rim fit.  The cup was positioned anatomically within the acetabular portion of the pelvis.      At this point, the femur was rolled at 80 degrees.  Further capsule was   released off the inferior aspect of the femoral neck.  I then   released the superior capsule proximally.  The hook was placed laterally   along the femur and elevated manually and held in position with the bed   hook.  The leg was then extended and adducted with the leg rolled to 100   degrees of external rotation.  Once the proximal femur was fully   exposed, I used a box osteotome to set orientation.  I then began   broaching with the starting chili pepper broach and passed this by hand and then broached up to 1.  With the 1 broach in place I chose a high offset neck and did several trial reductions.  The offset was appropriate, leg lengths   appeared to be equal best match the other hip with the +5 head ball trial, confirmed radiographically.   Given these findings, I went ahead and dislocated the hip, repositioned all   retractors and positioned the right hip in the extended and abducted position.  The final 1 Hi Tri Lock stem was   chosen and it was impacted down to the level of neck cut.  Based on this   and the trial reduction, a 32+5 delta ceramic ball was chosen and   impacted onto a clean and dry trunnion, and the hip was reduced.  The   hip had been irrigated throughout the case again at this point.  I did   reapproximate the superior capsular leaflet to the anterior leaflet   using #1 Vicryl.  The fascia of the   tensor fascia lata muscle was then reapproximated using #1 Vicryl and #0 V-lock sutures.  The   remaining wound was closed with 2-0 Vicryl and running 4-0 Monocryl.    The hip  was cleaned, dried, and dressed sterilely using Dermabond and   Aquacel dressing.  She was then brought   to recovery room in stable condition tolerating the procedure well.    Danae Orleans, PA-C was present for the entirety of the case involved from   preoperative positioning, perioperative retractor management, general   facilitation of the case, as well as primary wound closure as assistant.            Pietro Cassis Alvan Dame, M.D.        12/18/2015 2:05 PM

## 2015-12-19 DIAGNOSIS — E663 Overweight: Secondary | ICD-10-CM | POA: Diagnosis present

## 2015-12-19 LAB — BASIC METABOLIC PANEL
Anion gap: 9 (ref 5–15)
BUN: 27 mg/dL — ABNORMAL HIGH (ref 6–20)
CO2: 25 mmol/L (ref 22–32)
CREATININE: 0.86 mg/dL (ref 0.44–1.00)
Calcium: 9.4 mg/dL (ref 8.9–10.3)
Chloride: 104 mmol/L (ref 101–111)
GFR calc non Af Amer: >60 mL/min (ref >60)
GLUCOSE: 204 mg/dL — AB (ref 65–99)
Potassium: 4.2 mmol/L (ref 3.5–5.1)
Sodium: 138 mmol/L (ref 135–145)

## 2015-12-19 LAB — CBC
HEMATOCRIT: 35 % — AB (ref 36.0–46.0)
Hemoglobin: 12.1 g/dL (ref 12.0–15.0)
MCH: 31.1 pg (ref 26.0–34.0)
MCHC: 34.6 g/dL (ref 30.0–36.0)
MCV: 90 fL (ref 78.0–100.0)
Platelets: 176 10*3/uL (ref 150–400)
RBC: 3.89 MIL/uL (ref 3.87–5.11)
RDW: 12.2 % (ref 11.5–15.5)
WBC: 6.5 10*3/uL (ref 4.0–10.5)

## 2015-12-19 MED ORDER — HYDROCODONE-ACETAMINOPHEN 7.5-325 MG PO TABS: 1.0000 | ORAL_TABLET | ORAL | Status: DC | PRN

## 2015-12-19 MED ORDER — POLYETHYLENE GLYCOL 3350 17 G PO PACK: 17.0000 g | PACK | Freq: Two times a day (BID) | ORAL | Status: DC

## 2015-12-19 MED ORDER — ASPIRIN 325 MG PO TBEC: 325.0000 mg | DELAYED_RELEASE_TABLET | Freq: Two times a day (BID) | ORAL | Status: AC

## 2015-12-19 MED ORDER — DOCUSATE SODIUM 100 MG PO CAPS: 100.0000 mg | ORAL_CAPSULE | Freq: Two times a day (BID) | ORAL | Status: DC

## 2015-12-19 MED ORDER — CYCLOBENZAPRINE HCL 10 MG PO TABS: 10.0000 mg | ORAL_TABLET | Freq: Three times a day (TID) | ORAL | Status: DC | PRN

## 2015-12-19 MED ORDER — FERROUS SULFATE 325 (65 FE) MG PO TABS: 325.0000 mg | ORAL_TABLET | Freq: Three times a day (TID) | ORAL | Status: DC

## 2015-12-19 NOTE — Care Management Note (Signed)
Case Management Note  Patient Details  Name: [name] MRN: [id number] Date of Birth: 09/26/1930  Subjective/Objective:                  LEFT TOTAL HIP ARTHROPLASTY ANTERIOR APPROACH (Left) Action/Plan: Discharge planning Expected Discharge Date:  12/20/15               Expected Discharge Plan:  Home w Home Health Services  In-House Referral:     Discharge planning Services  CM Consult  Post Acute Care Choice:    Choice offered to:  Patient  DME Arranged:  N/A DME Agency:  NA  HH Arranged:  PT HH Agency:  [facility]  Status of Service:  Completed, signed off  Medicare Important Message Given:    Date Medicare IM Given:    Medicare IM give by:    Date Additional Medicare IM Given:    Additional Medicare Important Message give by:     If discussed at Long Length of Stay Meetings, dates discussed:    Additional Comments: CM met with pt in room to offer choice of home health agency.  Pt chooses [facility] to render HHPT.  Pt has both a rolling walker and 3n1 at home.  Referral called to [facility] rep, Tim.  No other CM needs were communicated. Jeffries, Sarah Christine, RN 12/19/2015, 10:07 AM  

## 2015-12-19 NOTE — Evaluation (Signed)
Physical Therapy Evaluation Patient Details Name: [name] MRN: [id number] DOB: 1929-12-17 Today's Date: 12/19/2015   History of Present Illness  Pt is an 82 year old female s/p L DA THA  Clinical Impression  Pt is s/p L THA resulting in the deficits listed below (see PT Problem List).  Pt will benefit from skilled PT to increase their independence and safety with mobility to allow discharge to the venue listed below.  Pt mobilizing slowly and requiring increased cues for technique and safety.  Pt plans to have family assist upon d/c.     Follow Up Recommendations Home health PT;Supervision for mobility/OOB    Equipment Recommendations  None recommended by PT    Recommendations for Other Services       Precautions / Restrictions Precautions Precautions: Fall Restrictions Weight Bearing Restrictions: No Other Position/Activity Restrictions: WBAT      Mobility  Bed Mobility Overal bed mobility: Needs Assistance Bed Mobility: Supine to Sit     Supine to sit: Min assist     General bed mobility comments: pt up in recliner on arrival  Transfers Overall transfer level: Needs assistance Equipment used: Rolling walker (2 wheeled) Transfers: Sit to/from [facility] Sit to Stand: Min assist Stand pivot transfers: Min guard       General transfer comment: verbal cues for UE and LE positioning, pt reports easier transfer compared to earlier with OT, pivot to [facility] due to urinary urgency  Ambulation/Gait Ambulation/Gait assistance: Min assist Ambulation Distance (Feet): 20 Feet Assistive device: Rolling walker (2 wheeled) Gait Pattern/deviations: Step-to pattern;Narrow base of support     General Gait Details: max verbal cues for sequence, step length, RW positioning, recliner following for safety, increased time required  Stairs            Wheelchair Mobility    Modified Rankin (Stroke Patients Only)       Balance                                              Pertinent Vitals/Pain Pain Assessment: 0-10 Pain Score: 8  Pain Location: L hip Pain Descriptors / Indicators: Burning;Sore Pain Intervention(s): Limited activity within patient's tolerance;Monitored during session;Repositioned;Ice applied    Home Living Family/patient expects to be discharged to:: Private residence Living Arrangements: Spouse/significant other Available Help at Discharge: Family Type of Home: House Home Access: Stairs to enter Entrance Stairs-Rails: None Entrance Stairs-Number of Steps: 1 Home Layout: One level Home Equipment: Bedside commode;Shower seat;Walker - 2 wheels      Prior Function Level of Independence: Independent         Comments: pt helped her husband; daughters and sister will assist post d/c     Hand Dominance        Extremity/Trunk Assessment   Upper Extremity Assessment: Overall WFL for tasks assessed           Lower Extremity Assessment: LLE deficits/detail   LLE Deficits / Details: decreased functional hip strength observed, hip grossly 2+/5     Communication   Communication: No difficulties  Cognition Arousal/Alertness: Awake/alert Behavior During Therapy: WFL for tasks assessed/performed Overall Cognitive Status: Within Functional Limits for tasks assessed Area of Impairment: Following commands       Following Commands: Follows one step commands with increased time       General Comments: increased time and cues likely due  to pain meds (per pt and daughter)    General Comments      Exercises        Assessment/Plan    PT Assessment Patient needs continued PT services  PT Diagnosis Difficulty walking;Acute pain   PT Problem List Decreased strength;Decreased activity tolerance;Decreased mobility;Decreased knowledge of use of DME;Pain  PT Treatment Interventions Functional mobility training;Gait training;Stair training;DME instruction;Patient/family  education;Therapeutic activities;Therapeutic exercise   PT Goals (Current goals can be found in the Care Plan section) Acute Rehab PT Goals Patient Stated Goal: get back to being independent; decreased pain PT Goal Formulation: With patient/family Time For Goal Achievement: 12/22/15 Potential to Achieve Goals: Good    Frequency 7X/week   Barriers to discharge        Co-evaluation               End of Session Equipment Utilized During Treatment: Gait belt Activity Tolerance: Patient tolerated treatment well Patient left: in chair;with call bell/phone within reach;with chair alarm set;with family/visitor present Nurse Communication: Mobility status         Time: [id number] PT Time Calculation (min) (ACUTE ONLY): 18 min   Charges:   PT Evaluation $PT Eval Moderate Complexity: 1 Procedure     PT G Codes:        Aedin Jeansonne,KATHrine E 12/19/2015, 11:27 AM Carmelia Bake, PT, DPT 12/19/2015 Pager: [phone]

## 2015-12-19 NOTE — Progress Notes (Signed)
Physical Therapy Treatment Note    12/19/15 1600  PT Visit Information  Last PT Received On 12/19/15  Assistance Needed +1  History of Present Illness Pt is an 82 year old female s/p L DA THA  PT Time Calculation  PT Start Time (ACUTE ONLY) 1332  PT Stop Time (ACUTE ONLY) 1404  PT Time Calculation (min) (ACUTE ONLY) 32 min  Subjective Data  Subjective Pt assisted with ambulating in hallway again this afternoon.  Pt performing technique better however requires increased time.  Pt with sharp pain in L hip upon attempting more flexion during step and very cautious thereafter as well.  Pt reports tight, stiff hip (RN notified and to check on meds).  Precautions  Precautions Fall  Restrictions  Other Position/Activity Restrictions WBAT  Pain Assessment  Pain Assessment 0-10  Pain Score 8  Pain Location L hip  Pain Descriptors / Indicators Sore;Sharp  Pain Intervention(s) Limited activity within patient's tolerance;Monitored during session;Repositioned  Cognition  Arousal/Alertness Awake/alert  Behavior During Therapy WFL for tasks assessed/performed  Overall Cognitive Status Within Functional Limits for tasks assessed  Bed Mobility  General bed mobility comments pt up in recliner on arrival  Transfers  Overall transfer level Needs assistance  Equipment used Rolling walker (2 wheeled)  Transfers Sit to/from Stand  Sit to Stand Min guard  General transfer comment verbal cues for UE and LE positioning  Ambulation/Gait  Ambulation/Gait assistance Min guard  Ambulation Distance (Feet) 25 Feet  Assistive device Rolling walker (2 wheeled)  Gait Pattern/deviations Step-to pattern;Antalgic;Decreased stance time - left  General Gait Details pt performing sequence better this afternoon however after sharp pain with L LE flexion switched to R LE leading for better pain control, increased time required  PT - End of Session  Activity Tolerance Patient tolerated treatment well  Patient left  with call bell/phone within reach;in chair;with family/visitor present  PT - Assessment/Plan  PT Plan Current plan remains appropriate  PT Frequency (ACUTE ONLY) 7X/week  Follow Up Recommendations Home health PT;Supervision for mobility/OOB  PT equipment None recommended by PT  PT Goal Progression  Progress towards PT goals Progressing toward goals  PT General Charges  $$ ACUTE PT VISIT 1 Procedure  PT Treatments  $Gait Training 23-37 mins   Carmelia Bake, PT, DPT 12/19/2015 Pager: [phone]

## 2015-12-19 NOTE — Evaluation (Signed)
Occupational Therapy Evaluation Patient Details Name: [name] MRN: [id number] DOB: 1930/04/01 Today's Date: 12/19/2015    History of Present Illness s/p L DA THA   Clinical Impression   This 82 year old female was admitted for the above sx.  She was independent with adls and assisted husband prior to admission. She currently needs up to max A for LB dressing and will benefit from skilled OT in acute setting. Goals are for min guard to min A overall.    Follow Up Recommendations  Supervision/Assistance - 24 hour    Equipment Recommendations  None recommended by OT    Recommendations for Other Services       Precautions / Restrictions Precautions Precautions: Fall Restrictions Weight Bearing Restrictions: No      Mobility Bed Mobility Overal bed mobility: Needs Assistance Bed Mobility: Supine to Sit     Supine to sit: Min assist     General bed mobility comments: assist for LLE and cues for sequence  Transfers Overall transfer level: Needs assistance Equipment used: Rolling walker (2 wheeled) Transfers: Sit to/from [facility] Sit to Stand: Mod assist Stand pivot transfers: Min assist;Mod assist       General transfer comment: slow transition to standing.  Cues for UE/LE placement. Assist to rise and stabilize    Balance                                            ADL Overall ADL's : Needs assistance/impaired     Grooming: Set up;Sitting   Upper Body Bathing: Set up;Sitting   Lower Body Bathing: Moderate assistance;Sit to/from stand   Upper Body Dressing : Minimal assistance;Sitting (lines)   Lower Body Dressing: Maximal assistance;Sit to/from stand   Toilet Transfer: Moderate assistance;Stand-pivot;RW (to chair)   Toileting- Clothing Manipulation and Hygiene: Moderate assistance;Sit to/from stand         General ADL Comments: pt got to eob with min A, extra time. She may continue to sleep in recliner at  home.  Pt had increased pain with standing and needed assistance to advance LLE.  She turned heel/toe to get to chair.  Pt with urinary incontinence when standing--just had catheter pulled.  Educated on sock aide and tried this.  She has a Secondary school teacher at home and will have assistance as needed     Vision     Perception     Praxis      Pertinent Vitals/Pain Pain Assessment: 0-10 Pain Score:  (2-8 with movement) Pain Location: L hip Pain Descriptors / Indicators: Burning Pain Intervention(s): Limited activity within patient's tolerance;Monitored during session;Premedicated before session;Repositioned;Ice applied     Hand Dominance     Extremity/Trunk Assessment Upper Extremity Assessment Upper Extremity Assessment: Overall WFL for tasks assessed           Communication Communication Communication: No difficulties   Cognition Arousal/Alertness: Awake/alert Behavior During Therapy: WFL for tasks assessed/performed Overall Cognitive Status: Within Functional Limits for tasks assessed                     General Comments       Exercises       Shoulder Instructions      Home Living Family/patient expects to be discharged to:: Private residence Living Arrangements: Spouse/significant other Available Help at Discharge: [facility]  Layout: One level     Bathroom Shower/Tub: Walk-in shower (walk in tub also)   Bathroom Toilet: Handicapped height     Home Equipment: Bedside commode;Shower seat          Prior Functioning/Environment Level of Independence: Independent        Comments: pt helped her husband; daughters and sister will assist post d/c    OT Diagnosis: Generalized weakness;Acute pain   OT Problem List: Decreased strength;Decreased activity tolerance;Pain;Decreased knowledge of use of DME or AE   OT Treatment/Interventions: Self-care/ADL training;DME and/or AE instruction;Patient/family education;Therapeutic activities    OT  Goals(Current goals can be found in the care plan section) Acute Rehab OT Goals Patient Stated Goal: get back to being independent; decreased pain OT Goal Formulation: With patient Time For Goal Achievement: 12/26/15 Potential to Achieve Goals: Good ADL Goals Pt Will Perform Grooming: with supervision;standing Pt Will Perform Lower Body Dressing: with adaptive equipment;sit to/from stand;with min guard assist (pants with reacher) Pt Will Transfer to Toilet: with min assist;bedside commode;ambulating Pt Will Perform Toileting - Clothing Manipulation and hygiene: with min guard assist;sit to/from stand Pt Will Perform Tub/Shower Transfer: Shower transfer;with min assist;ambulating;shower seat (vs verbalize sequence)  OT Frequency: Min 2X/week   Barriers to D/C:            Co-evaluation              End of Session    Activity Tolerance: Patient tolerated treatment well Patient left: in chair;with call bell/phone within reach;with family/visitor present   Time: [id number] OT Time Calculation (min): 31 min Charges:  OT General Charges $OT Visit: 1 Procedure OT Evaluation $OT Eval Low Complexity: 1 Procedure OT Treatments $Self Care/Home Management : 8-22 mins G-Codes:    Raquell Richer 2015/12/21, 8:50 AM  Lesle Chris, OTR/L [phone] 12/21/15

## 2015-12-19 NOTE — Progress Notes (Signed)
     Subjective: 1 Day Post-Op Procedure(s) (LRB): LEFT TOTAL HIP ARTHROPLASTY ANTERIOR APPROACH (Left)   Seen by Dr. Alvan Dame. Patient reports pain as mild, pain controlled. No events throughout the night. Ready to be discharged home if she does well with PT and pain stays controlled.   Objective:   VITALS:   Filed Vitals:   12/19/15 0202 12/19/15 0627  BP: 110/48 121/50  Pulse: 66 70  Temp: 97.6 F (36.4 C) 97.6 F (36.4 C)  Resp: 18 18    Dorsiflexion/Plantar flexion intact Incision: dressing C/D/I No cellulitis present Compartment soft  LABS  Recent Labs  12/19/15 0500  HGB 12.1  HCT 35.0*  WBC 6.5  PLT 176     Recent Labs  12/19/15 0500  NA 138  K 4.2  BUN 27*  CREATININE 0.86  GLUCOSE 204*     Assessment/Plan: 1 Day Post-Op Procedure(s) (LRB): LEFT TOTAL HIP ARTHROPLASTY ANTERIOR APPROACH (Left) Foley cath d/c'ed Advance diet Up with therapy D/C IV fluids Discharge home with home health  Follow up in 2 weeks at [facility]. Follow up with OLIN,[place] Shannahan D in 2 weeks.  Contact information:  [facility] [address] [place] [id number]    Overweight (BMI 25-29.9) Estimated body mass index is 28.37 kg/(m^2) as calculated from the following:   Height as of this encounter: 5\' 4"  (1.626 m).   Weight as of this encounter: 75.014 kg (165 lb 6 oz). Patient also counseled that weight may inhibit the healing process Patient counseled that losing weight will help with future health issues         West Pugh. Lyndsy Gilberto   PAC  12/19/2015, 7:59 AM

## 2015-12-20 LAB — CBC
HCT: 35.3 % — ABNORMAL LOW (ref 36.0–46.0)
HEMOGLOBIN: 12.2 g/dL (ref 12.0–15.0)
MCH: 31.4 pg (ref 26.0–34.0)
MCHC: 34.6 g/dL (ref 30.0–36.0)
MCV: 90.7 fL (ref 78.0–100.0)
PLATELETS: 191 10*3/uL (ref 150–400)
RBC: 3.89 MIL/uL (ref 3.87–5.11)
RDW: 12.7 % (ref 11.5–15.5)
WBC: 13.1 10*3/uL — AB (ref 4.0–10.5)

## 2015-12-20 LAB — BASIC METABOLIC PANEL
Anion gap: 10 (ref 5–15)
BUN: 25 mg/dL — ABNORMAL HIGH (ref 6–20)
CO2: 26 mmol/L (ref 22–32)
Calcium: 9.5 mg/dL (ref 8.9–10.3)
Chloride: 103 mmol/L (ref 101–111)
Creatinine, Ser: 0.77 mg/dL (ref 0.44–1.00)
GFR calc Af Amer: >60 mL/min (ref >60)
GFR calc non Af Amer: >60 mL/min (ref >60)
Glucose, Bld: 134 mg/dL — ABNORMAL HIGH (ref 65–99)
Potassium: 4.1 mmol/L (ref 3.5–5.1)
Sodium: 139 mmol/L (ref 135–145)

## 2015-12-20 NOTE — Progress Notes (Signed)
Physical Therapy Treatment Patient Details Name: [name] MRN: [id number] DOB: 1930/01/15 Today's Date: 12/20/2015    History of Present Illness Pt is an 82 year old female s/p L DA THA    PT Comments    POD # 2 Pt OOB in recliner.  Assisted with amb in hallway.  Practiced going up and down one step twice.  Performed all supine THR TE's following HEP handout.  Instructed on proper tech and freq as well as use of ice.    Follow Up Recommendations  Home health PT     Equipment Recommendations  None recommended by PT (has RW)    Recommendations for Other Services       Precautions / Restrictions Precautions Precautions: Fall Restrictions Weight Bearing Restrictions: No Other Position/Activity Restrictions: WBAT    Mobility  Bed Mobility               General bed mobility comments: pt up in recliner on arrival  Transfers Overall transfer level: Needs assistance Equipment used: Rolling walker (2 wheeled) Transfers: Sit to/from Stand Sit to Stand: Supervision         General transfer comment: good safety cognition and use of hands to steady self  Ambulation/Gait Ambulation/Gait assistance: Supervision;Min guard Ambulation Distance (Feet): 85 Feet Assistive device: Rolling walker (2 wheeled) Gait Pattern/deviations: Step-to pattern;Step-through pattern;Decreased stride length;Trunk flexed Gait velocity: decreased   General Gait Details: preferred opposite LE lead which decreased pain level.    Stairs Stairs: Yes Stairs assistance: Min guard Stair Management: No rails;Step to pattern;Forwards;With walker Number of Stairs: 1 General stair comments: 25% VC's on proper sequencing and walker placement.  Performed twice.   Wheelchair Mobility    Modified Rankin (Stroke Patients Only)       Balance                                    Cognition Arousal/Alertness: Awake/alert Behavior During Therapy: WFL for tasks  assessed/performed Overall Cognitive Status: Within Functional Limits for tasks assessed                      Exercises   Total Hip Replacement TE's 10 reps ankle pumps 10 reps knee presses 10 reps heel slides 10 reps SAQ's 10 reps ABD Followed by ICE     General Comments        Pertinent Vitals/Pain Pain Assessment: 0-10 Pain Score: 2  Pain Location: L hip Pain Descriptors / Indicators: Sore Pain Intervention(s): Limited activity within patient's tolerance;Monitored during session;Premedicated before session;Repositioned    Home Living                      Prior Function            PT Goals (current goals can now be found in the care plan section) Progress towards PT goals: Progressing toward goals    Frequency  7X/week    PT Plan Current plan remains appropriate    Co-evaluation             End of Session Equipment Utilized During Treatment: Gait belt Activity Tolerance: Patient tolerated treatment well Patient left: in chair;with call bell/phone within reach;with family/visitor present     Time: 0900-0930 PT Time Calculation (min) (ACUTE ONLY): 30 min  Charges:  $Gait Training: 8-22 mins $Therapeutic Exercise: 8-22 mins  G Codes:     Rica Koyanagi  PTA [facility]  Acute  Rehab Pager      [phone]

## 2015-12-20 NOTE — Progress Notes (Signed)
Occupational Therapy Treatment Patient Details Name: NIKITTA WIEDNER MRN: [id number] DOB: Oct 18, 1930 Today's Date: 12/20/2015    History of present illness Pt is an 82 year old female s/p L DA THA   OT comments  All education completed.  Pt discharging home with family  Follow Up Recommendations  Supervision/Assistance - 24 hour    Equipment Recommendations  None recommended by OT    Recommendations for Other Services      Precautions / Restrictions Precautions Precautions: Fall Restrictions Weight Bearing Restrictions: No Other Position/Activity Restrictions: WBAT       Mobility Bed Mobility               General bed mobility comments: pt up in recliner on arrival  Transfers Overall transfer level: Needs assistance Equipment used: Rolling walker (2 wheeled) Transfers: Sit to/from Stand Sit to Stand: Supervision         General transfer comment: cues for UE/LE placement    Balance                                   ADL                           Toilet Transfer: Min guard;Ambulation;BSC       Tub/ Shower Transfer: Walk-in shower;Min guard;Ambulation     General ADL Comments: cues for sequence; handout given.  Family present      Vision                     Perception     Praxis      Cognition   Behavior During Therapy: WFL for tasks assessed/performed Overall Cognitive Status: Within Functional Limits for tasks assessed                       Extremity/Trunk Assessment               Exercises     Shoulder Instructions       General Comments      Pertinent Vitals/ Pain       Pain Assessment: 0-10 Pain Score: 2  Pain Location: L hip Pain Descriptors / Indicators: Sore Pain Intervention(s): Limited activity within patient's tolerance;Monitored during session;Premedicated before session;Repositioned  Home Living                                          Prior  Functioning/Environment              Frequency       Progress Toward Goals  OT Goals(current goals can now be found in the care plan section)  Progress towards OT goals: Progressing toward goals (pt for discharge today; no further OT needs)     Plan      Co-evaluation                 End of Session     Activity Tolerance Patient tolerated treatment well   Patient Left in chair;with call bell/phone within reach;with family/visitor present   Nurse Communication          Time: [id number] OT Time Calculation (min): 18 min  Charges: OT General Charges $OT Visit: 1 Procedure OT Treatments $Self Care/Home Management : 8-22 mins  Jacoya Bauman  12/20/2015, 12:10 PM  Lesle Chris, OTR/L [phone] 12/20/2015

## 2015-12-20 NOTE — Progress Notes (Signed)
     Subjective: 2 Days Post-Op Procedure(s) (LRB): LEFT TOTAL HIP ARTHROPLASTY ANTERIOR APPROACH (Left)   Patient reports pain as mild, pain controlled. No events throughout the night. Feels better today than yesterday.  Ready to be discharged home.  Objective:   VITALS:   Filed Vitals:   12/19/15 1930 12/20/15 0413  BP: 134/58 145/62  Pulse: 71 64  Temp: 98.5 F (36.9 C) 97.7 F (36.5 C)  Resp: 18 18    Dorsiflexion/Plantar flexion intact Incision: dressing C/D/I No cellulitis present Compartment soft  LABS  Recent Labs  12/19/15 0500 12/20/15 0358  HGB 12.1 12.2  HCT 35.0* 35.3*  WBC 6.5 13.1*  PLT 176 191     Recent Labs  12/19/15 0500 12/20/15 0358  NA 138 139  K 4.2 4.1  BUN 27* 25*  CREATININE 0.86 0.77  GLUCOSE 204* 134*     Assessment/Plan: 2 Days Post-Op Procedure(s) (LRB): LEFT TOTAL HIP ARTHROPLASTY ANTERIOR APPROACH (Left) Up with therapy Discharge home with home health  Follow up in 2 weeks at [facility]. Follow up with OLIN,Jameis Newsham D in 2 weeks.  Contact information:  [facility] [address] [place] [id number]         Overweight (BMI 25-29.9) Estimated body mass index is 28.37 kg/(m^2) as calculated from the following:  Height as of this encounter: 5\' 4"  (1.626 m).  Weight as of this encounter: 75.014 kg (165 lb 6 oz). Patient also counseled that weight may inhibit the healing process Patient counseled that losing weight will help with future health issues         West Pugh. Rylee Huestis   PAC  12/20/2015, 7:16 AM

## 2015-12-21 LAB — POCT I-STAT 4, (NA,K, GLUC, HGB,HCT)
GLUCOSE: 98 mg/dL (ref 65–99)
HEMATOCRIT: 41 % (ref 36.0–46.0)
Hemoglobin: 13.9 g/dL (ref 12.0–15.0)
Potassium: 3.6 mmol/L (ref 3.5–5.1)
SODIUM: 140 mmol/L (ref 135–145)

## 2015-12-27 NOTE — Discharge Summary (Signed)
Physician Discharge Summary  Patient ID: [name] MRN: [id number] DOB/AGE: 1929-12-27 82 y.o.  Admit date: 12/18/2015 Discharge date: 12/20/2015   Procedures:  Procedure(s) (LRB): LEFT TOTAL HIP ARTHROPLASTY ANTERIOR APPROACH (Left)  Attending Physician:  Dr. Paralee Cancel   Admission Diagnoses:   Left hip primary OA / pain   Discharge Diagnoses:  Principal Problem:   S/P left THA, AA Active Problems:   Overweight (BMI 25.0-29.9)  Past Medical History  Diagnosis Date  . Hypertension   . Bradycardia   . Chronic cough   . Aortic valve sclerosis   . Edema of lower extremity   . Hyperlipidemia   . OA (osteoarthritis)   . GERD (gastroesophageal reflux disease)   . Renal insufficiency   . Obesity   . Leg pain, left   . Hip pain     left  . Varicose veins   . Heart murmur   . Wears glasses   . Hard of hearing     has hearing aides but does not wear   . Tinnitus   . History of kidney stones   . History of bladder infections     08/2015  . Urinary incontinence   . History of hiatal hernia   . 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 H/O one miscarriage   . History of blood transfusion   . Dysrhythmia     A Fib    HPI:    [name], 82 y.o. female, has a history of pain and functional disability in the left hip(s) due to arthritis and patient has failed non-surgical conservative treatments for greater than 12 weeks to include NSAID's and/or analgesics, use of assistive devices and activity modification. Onset of symptoms was gradual starting 3+ years ago with gradually worsening course since that time.The patient noted no past surgery on the left hip(s). Patient currently rates pain in the left hip at 8 out of 10 with activity. Patient has night pain, worsening of pain with activity and weight bearing, trendelenberg gait, pain that interfers with activities of daily living and pain with passive range of motion. Patient has evidence of periarticular  osteophytes and joint space narrowing by imaging studies. This condition presents safety issues increasing the risk of falls. There is no current active infection. Risks, benefits and expectations were discussed with the patient. Risks including but not limited to the risk of anesthesia, blood clots, nerve damage, blood vessel damage, failure of the prosthesis, infection and up to and including death. Patient understand the risks, benefits and expectations and wishes to proceed with surgery.   PCP: Chesley Noon, MD   Discharged Condition: good  Hospital Course:  Patient underwent the above stated procedure on 12/18/2015. Patient tolerated the procedure well and brought to the recovery room in good condition and subsequently to the floor.  POD #1 BP: 121/50 ; Pulse: 70 ; Temp: 97.6 F (36.4 C) ; Resp: 18 Patient reports pain as mild, pain controlled. No events throughout the night.  LABS  Basename    HGB     12.1  HCT     35.0   POD #2  BP: 145/62 ; Pulse: 64 ; Temp: 97.7 F (36.5 C) ; Resp: 18 Patient reports pain as mild, pain controlled. No events throughout the night. Feels better today than yesterday. Ready to be discharged home. Dorsiflexion/plantar flexion intact, incision: dressing C/D/I, no cellulitis present and compartment soft.   LABS  Basename  HGB     12.2  HCT     35.3    Discharge Exam: General appearance: alert, cooperative and no distress Extremities: Homans sign is negative, no sign of DVT, no edema, redness or tenderness in the calves or thighs and no ulcers, gangrene or trophic changes  Disposition: Home with follow up in 2 weeks   Follow-up Information    Follow up with Mauri Pole, MD. Schedule an appointment as soon as possible for a visit in 2 weeks.   Specialty:  Orthopedic Surgery   Contact information:   [address] [place] [address] [phone]       Follow up with [facility].   Why:  home health  physical therapy   Contact information:   [place] [address] [phone]       Discharge Instructions    Call MD / Call 911    Complete by:  As directed   If you experience chest pain or shortness of breath, CALL 911 and be transported to the hospital emergency room.  If you develope a fever above 101 F, pus (white drainage) or increased drainage or redness at the wound, or calf pain, call your surgeon's office.     Change dressing    Complete by:  As directed   Maintain surgical dressing until follow up in the clinic. If the edges start to pull up, may reinforce with tape. If the dressing is no longer working, may remove and cover with gauze and tape, but must keep the area dry and clean.  Call with any questions or concerns.     Constipation Prevention    Complete by:  As directed   Drink plenty of fluids.  Prune juice may be helpful.  You may use a stool softener, such as Colace (over the counter) 100 mg twice a day.  Use MiraLax (over the counter) for constipation as needed.     Diet - low sodium heart healthy    Complete by:  As directed      Discharge instructions    Complete by:  As directed   Maintain surgical dressing until follow up in the clinic. If the edges start to pull up, may reinforce with tape. If the dressing is no longer working, may remove and cover with gauze and tape, but must keep the area dry and clean.  Follow up in 2 weeks at [facility]. Call with any questions or concerns.     Increase activity slowly as tolerated    Complete by:  As directed   Weight bearing as tolerated with assist device (walker, cane, etc) as directed, use it as long as suggested by your surgeon or therapist, typically at least 4-6 weeks.     TED hose    Complete by:  As directed   Use stockings (TED hose) for 2 weeks on both leg(s).  You may remove them at night for sleeping.             Medication List    STOP taking these medications          acetaminophen 325 MG tablet  Commonly known as:  TYLENOL     HYDROcodone-acetaminophen 5-325 MG tablet  Commonly known as:  NORCO/VICODIN  Replaced by:  HYDROcodone-acetaminophen 7.5-325 MG tablet      TAKE these medications        ALPRAZolam 0.5 MG tablet  Commonly known as:  XANAX  Take 0.25-0.5  mg by mouth 3 (three) times daily as needed for anxiety.     amLODipine 10 MG tablet  Commonly known as:  NORVASC  Take 5 mg by mouth daily.     aspirin 325 MG EC tablet  Take 1 tablet (325 mg total) by mouth 2 (two) times daily.     cloNIDine 0.1 MG tablet  Commonly known as:  CATAPRES  Take 0.1 mg by mouth at bedtime.     cyclobenzaprine 10 MG tablet  Commonly known as:  FLEXERIL  Take 1 tablet (10 mg total) by mouth 3 (three) times daily as needed for muscle spasms.     DIOVAN HCT 320-25 MG tablet  Generic drug:  valsartan-hydrochlorothiazide  Take 1 tablet by mouth Daily.     docusate sodium 100 MG capsule  Commonly known as:  COLACE  Take 1 capsule (100 mg total) by mouth 2 (two) times daily.     ferrous sulfate 325 (65 FE) MG tablet  Take 1 tablet (325 mg total) by mouth 3 (three) times daily after meals.     HYDROcodone-acetaminophen 7.5-325 MG tablet  Commonly known as:  NORCO  Take 1-2 tablets by mouth every 4 (four) hours as needed for moderate pain.     metoprolol tartrate 25 MG tablet  Commonly known as:  LOPRESSOR  Take 12.5 mg by mouth 2 (two) times daily.     multivitamin tablet  Take 1 tablet by mouth daily.     polyethylene glycol packet  Commonly known as:  MIRALAX / GLYCOLAX  Take 17 g by mouth 2 (two) times daily.     polyvinyl alcohol 1.4 % ophthalmic solution  Commonly known as:  LIQUIFILM TEARS  Place 1 drop into both eyes 3 (three) times daily as needed for dry eyes.     zolpidem 5 MG tablet  Commonly known as:  AMBIEN  Take 5 mg by mouth at bedtime as needed.         Signed: West Pugh. Amayia Ciano   PA-C  12/27/2015, 10:55 AM

## 2016-05-20 NOTE — Progress Notes (Signed)
Electrophysiology Office Note   Date:  05/21/2016   ID:  [name], DOB [birth year], MRN [id number]  PCP:  Chesley Noon, MD  Primary Electrophysiologist:  Constance Haw, MD    Chief Complaint  Patient presents with  . New Patient (Initial Visit)     History of Present Illness: [name] is a 82 y.o. female who presents today for electrophysiology evaluation.   She has a history of chronic atrial fibrillation, hypertension. Currently, she is on rivaroxaban for anticoagulation.She says that she had surgery on her hip last February, and since then she has been struggling with fatigue. There are times, she says that she is able to be active and moving around, but she is also at times having trouble with rehabilitation of her left hip. She says that she has been in atrial fibrillation for quite some time, though she cannot tell me when she was first diagnosed. She does remember that when she was in her 52s that she was told that she had some abnormality with her heart.     Today, she denies symptoms of chest pain, PND, lower extremity edema, claudication, dizziness, presyncope, syncope, bleeding, or neurologic sequela. The patient is tolerating medications without difficulties and is otherwise without complaint today.    Past Medical History  Diagnosis Date  . Hypertension   . Bradycardia   . Chronic cough   . Aortic valve sclerosis   . Edema of lower extremity   . Hyperlipidemia   . OA (osteoarthritis)   . GERD (gastroesophageal reflux disease)   . Renal insufficiency   . Obesity   . Leg pain, left   . Hip pain     left  . Varicose veins   . Heart murmur   . Wears glasses   . Hard of hearing     has hearing aides but does not wear   . Tinnitus   . History of kidney stones   . History of bladder infections     08/2015  . Urinary incontinence   . History of hiatal hernia   . 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 H/O one miscarriage   .  History of blood transfusion   . Dysrhythmia     A Fib   Past Surgical History  Procedure Laterality Date  . Cyst removal from ovary  2002  . Nasal septum surgery    . Cardiovascular stress test  06/25/2006    EF 62%  . Eye surgery      cataract surgery bilat   . Dilation and curettage of uterus    . Breast surgery      cyst removed per right breast 1977  . Total hip arthroplasty Left 12/18/2015    Procedure: LEFT TOTAL HIP ARTHROPLASTY ANTERIOR APPROACH;  Surgeon: Paralee Cancel, MD;  Location: [facility];  Service: Orthopedics;  Laterality: Left;     Current Outpatient Prescriptions  Medication Sig Dispense Refill  . acetaminophen (TYLENOL) 500 MG tablet Take 500 mg by mouth every 6 (six) hours as needed (pain).    Marland Kitchen ALPRAZolam (XANAX) 0.5 MG tablet Take 0.25-0.5 mg by mouth 3 (three) times daily as needed for anxiety.     . busPIRone (BUSPAR) 5 MG tablet Take 5 mg by mouth 2 (two) times daily.    . cloNIDine (CATAPRES) 0.1 MG tablet Take 0.1 mg by mouth at bedtime.     . cyclobenzaprine (FLEXERIL) 10 MG tablet Take 1  tablet (10 mg total) by mouth 3 (three) times daily as needed for muscle spasms. 30 tablet 0  . DIOVAN HCT 320-25 MG per tablet Take 1 tablet by mouth Daily.    Marland Kitchen docusate sodium (COLACE) 100 MG capsule Take 1 capsule (100 mg total) by mouth 2 (two) times daily. 10 capsule 0  . ferrous sulfate 325 (65 FE) MG tablet Take 1 tablet (325 mg total) by mouth 3 (three) times daily after meals.  3  . HYDROcodone-acetaminophen (NORCO) 7.5-325 MG tablet Take 1-2 tablets by mouth every 4 (four) hours as needed for moderate pain. 100 tablet 0  . metoprolol tartrate (LOPRESSOR) 25 MG tablet Take 12.5 mg by mouth 2 (two) times daily.     . Multiple Vitamin (MULTIVITAMIN) tablet Take 1 tablet by mouth daily.      . polyethylene glycol (MIRALAX / GLYCOLAX) packet Take 17 g by mouth 2 (two) times daily. 14 each 0  . polyvinyl alcohol (LIQUIFILM TEARS) 1.4 % ophthalmic solution Place 1 drop  into both eyes 3 (three) times daily as needed for dry eyes.    . rivaroxaban (XARELTO) 20 MG TABS tablet Take 20 mg by mouth daily.    Marland Kitchen zolpidem (AMBIEN) 5 MG tablet Take 5 mg by mouth at bedtime as needed.      No current facility-administered medications for this visit.    Allergies:   Ace inhibitors; Celebrex; Codeine; Norvasc; and Vioxx   Social History:  The patient  reports that she has never smoked. She has never used smokeless tobacco. She reports that she does not drink alcohol or use illicit drugs.   Family History:  The patient's family history includes Diabetes in her mother; Heart disease in her father; Hypertension in her brother and sister.    ROS:  Please see the history of present illness.   Otherwise, review of systems is positive for dyspnea on exertion, shortness of breath lying down, abdominal pain, back pain, muscle pain, dizziness.   All other systems are reviewed and negative.    PHYSICAL EXAM: VS:  Ht 5\' 3"  (1.6 m)  Wt 163 lb 12.8 oz (74.299 kg)  BMI 29.02 kg/m2 , BMI Body mass index is 29.02 kg/(m^2). GEN: Well nourished, well developed, in no acute distress HEENT: normal Neck: no JVD, carotid bruits, or masses Cardiac: RRR; no murmurs, rubs, or gallops,no edema  Respiratory:  clear to auscultation bilaterally, normal work of breathing GI: soft, nontender, nondistended, + BS MS: no deformity or atrophy Skin: warm and dry Neuro:  Strength and sensation are intact Psych: euthymic mood, full affect  EKG:  EKG is ordered today. The ekg ordered today shows atrial fibrillation, rate 50, septal Q waves    Recent  Labs: 12/20/2015: BUN 25*; Creatinine, Ser 0.77; Hemoglobin 12.2; Platelets 191; Potassium 4.1; Sodium 139    Lipid Panel  No results found for: CHOL, TRIG, HDL, CHOLHDL, VLDL, LDLCALC, LDLDIRECT   Wt Readings from Last 3 Encounters:  05/21/16 163 lb 12.8 oz (74.299 kg)  12/18/15 165 lb 6 oz (75.014 kg)  12/11/15 165 lb 6 oz (75.014 kg)       Other studies Reviewed: Additional studies/ records that were reviewed today include: TTE 2013  Review of the above records today demonstrates:  - Left ventricle: The cavity size was normal. Wall thickness was increased in a pattern of mild LVH. Systolic function was normal. The estimated ejection fraction was in the range of 50% to 55%. Wall motion was normal; there  were no regional wall motion abnormalities. Features are consistent with a pseudonormal left ventricular filling pattern, with concomitant abnormal relaxation and increased filling pressure (grade 2 diastolic dysfunction). - Aortic valve: Mild regurgitation. - Mitral valve: Mild regurgitation. - Left atrium: The atrium was mildly dilated. - Right atrium: The atrium was mildly dilated. - Tricuspid valve: Mild-moderate regurgitation. - Pulmonary arteries: PA peak pressure: 14mm Hg (S).   ASSESSMENT AND PLAN:  1.  Chronic atrial fibrillation: Currently takes rivaroxaban and metoprolol. She is having significant fatigue and palpitations. Her fatigue might be due to dropping heart rates possibly due to her beta blocker. We'll therefore hold her beta blocker to see if this [name] help with some of her symptoms. She also had an echocardiogram within the year at another cardiologist's office. We'll work to get the results of that study. With those results, [name] consider rhythm control strategy   This patients CHA2DS2-VASc Score and unadjusted Ischemic Stroke Rate (% per year) is equal to 4.8 % stroke rate/year from a score of 4  Above score calculated as 1 point each if present [CHF, HTN, DM, Vascular=MI/PAD/Aortic Plaque, Age if 65-74, or Female] Above score calculated as 2 points each if present [Age > 75, or Stroke/TIA/TE]   2. Hypertension: On Diovan, metoprolol, and clonidine. reactive to hold metoprolol for now due to relative bradycardia and symptoms of fatigue. She says that she [name] check her blood  pressure at home.     Current medicines are reviewed at length with the patient today.   The patient does not have concerns regarding her medicines.  The following changes were made today:  none  Labs/ tests ordered today include:  No orders of the defined types were placed in this encounter.     Disposition:   FU with [name] 3 months  Signed, Mary Kiester Meredith Leeds, MD  05/21/2016 10:18 AM     [facility] [address] [place] [address] [phone] (office) [phone] (fax)

## 2016-05-21 ENCOUNTER — Ambulatory Visit (INDEPENDENT_AMBULATORY_CARE_PROVIDER_SITE_OTHER): Payer: Medicare Other | Admitting: Cardiology

## 2016-05-21 ENCOUNTER — Encounter: Payer: Self-pay | Admitting: Cardiology

## 2016-05-21 VITALS — Ht 63.0 in | Wt 163.8 lb

## 2016-05-21 DIAGNOSIS — I482 Chronic atrial fibrillation, unspecified: Secondary | ICD-10-CM

## 2016-05-21 NOTE — Patient Instructions (Addendum)
Medication Instructions:  Your physician has recommended you make the following change in your medication: 1) STOP Metoprolol   Labwork: None ordered  Testing/Procedures: None ordered  Follow-Up: Your physician wants you to follow-up in: 3 months with Dr. Curt Bears. You will receive a reminder letter in the mail two months in advance. If you don't receive a letter, please call our office to schedule the follow-up appointment.  If you need a refill on your cardiac medications before your next appointment, please call your pharmacy.  Thank you for choosing [facility]!!   Trinidad Curet, RN [phone]   Any Other Special Instructions Will Be Listed Below (If Applicable). --  Please keep track of your blood pressures and call us in a couple of weeks with those numbers.

## 2016-06-16 ENCOUNTER — Telehealth: Payer: Self-pay | Admitting: Cardiology

## 2016-06-16 NOTE — Telephone Encounter (Signed)
Echo from 11/29/15 scanned in to Epic tab CV procedures.  I will forward to Dr Curt Bears for review.

## 2016-06-16 NOTE — Telephone Encounter (Signed)
Reviewed the echo.  No changes at this time.  Thressa Shiffer discuss rhythm control at the next visit.

## 2016-06-16 NOTE — Telephone Encounter (Signed)
New message    Patient calling wants to know have Dr.Camnitz  receive echo results from previous MD - from [facility], [place]

## 2016-06-16 NOTE — Telephone Encounter (Signed)
Pt not at home right now, try tomorrow.

## 2016-06-17 NOTE — Telephone Encounter (Signed)
Discussed Dr Curt Bears recommendations with pt.  Pt requesting sooner appt with Dr Curt Bears. Pt states she is having palpitations and would like to discuss options with Dr Curt Bears.  Pt has been scheduled to see Dr Curt Bears 07/07/16

## 2016-07-06 NOTE — Progress Notes (Signed)
Electrophysiology Office Note   Date:  07/07/2016   ID:  [name], DOB [birth year], MRN [id number]  PCP:  Chesley Noon, MD  Primary Electrophysiologist:  Constance Haw, MD    Chief Complaint  Patient presents with  . Follow-up    3 months  . Atrial Fibrillation     History of Present Illness: [name] is a 82 y.o. female who presents today for electrophysiology evaluation.   She has a history of chronic atrial fibrillation, hypertension. Currently, she is on rivaroxaban for anticoagulation. She continues to feel weak and fatigued. She says that she has some good days and some bad days. On the bad days, she has difficulty doing her daily activities. She has many responsibilities in the home and she does not have the energy to take care of them on her bad days.   Today, she denies symptoms of chest pain, PND, lower extremity edema, claudication, dizziness, presyncope, syncope, bleeding, or neurologic sequela. The patient is tolerating medications without difficulties and is otherwise without complaint today.    Past Medical History:  Diagnosis Date  . Aortic valve sclerosis   . Bradycardia   . 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 Chronic cough   . Dysrhythmia    A Fib  . Edema of lower extremity   . GERD (gastroesophageal reflux disease)   . H/O one miscarriage   . Hard of hearing    has hearing aides but does not wear   . Heart murmur   . Hip pain    left  . History of bladder infections    08/2015  . History of blood transfusion   . History of hiatal hernia   . History of kidney stones   . Hyperlipidemia   . Hypertension   . Leg pain, left   . OA (osteoarthritis)   . Obesity   . Renal insufficiency   . Tinnitus   . Urinary incontinence   . Varicose veins   . Wears glasses    Past Surgical History:  Procedure Laterality Date  . BREAST SURGERY     cyst removed per right breast 1977  . CARDIOVASCULAR STRESS TEST  06/25/2006    EF 62%  . CYST REMOVAL FROM OVARY  2002  . DILATION AND CURETTAGE OF UTERUS    . EYE SURGERY     cataract surgery bilat   . NASAL SEPTUM SURGERY    . TOTAL HIP ARTHROPLASTY Left 12/18/2015   Procedure: LEFT TOTAL HIP ARTHROPLASTY ANTERIOR APPROACH;  Surgeon: Paralee Cancel, MD;  Location: [facility];  Service: Orthopedics;  Laterality: Left;     Current Outpatient Prescriptions  Medication Sig Dispense Refill  . acetaminophen (TYLENOL) 500 MG tablet Take 500 mg by mouth every 6 (six) hours as needed (pain).    Marland Kitchen ALPRAZolam (XANAX) 0.5 MG tablet Take 0.25-0.5 mg by mouth 3 (three) times daily as needed for anxiety.     . busPIRone (BUSPAR) 5 MG tablet Take 5 mg by mouth 2 (two) times daily.    . cloNIDine (CATAPRES) 0.1 MG tablet Take 0.1 mg by mouth at bedtime.     Marland Kitchen DIOVAN HCT 320-25 MG per tablet Take 1 tablet by mouth Daily.    . Multiple Vitamin (MULTIVITAMIN) tablet Take 1 tablet by mouth daily.      . polyethylene glycol (MIRALAX / GLYCOLAX) packet Take 17 g by mouth 2 (two) times daily. 14 each 0  .  polyvinyl alcohol (LIQUIFILM TEARS) 1.4 % ophthalmic solution Place 1 drop into both eyes 3 (three) times daily as needed for dry eyes.    . rivaroxaban (XARELTO) 20 MG TABS tablet Take 20 mg by mouth daily.     No current facility-administered medications for this visit.     Allergies:   Ace inhibitors; Celebrex [celecoxib]; Codeine; Norvasc [amlodipine besylate]; and Vioxx [rofecoxib]   Social History:  The patient  reports that she has never smoked. She has never used smokeless tobacco. She reports that she does not drink alcohol or use drugs.   Family History:  The patient's family history includes Diabetes in her mother; Heart disease in her father; Hypertension in her brother and sister.    ROS:  Please see the history of present illness.   Otherwise, review of systems is positive for hearing loss, anxiety.   All other systems are reviewed and negative.    PHYSICAL EXAM: VS:   BP (!) 156/84   Pulse 86   Ht 5\' 4"  (1.626 m)   Wt 163 lb 3.2 oz (74 kg)   BMI 28.01 kg/m  , BMI Body mass index is 28.01 kg/m. GEN: Well nourished, well developed, in no acute distress  HEENT: normal  Neck: no JVD, carotid bruits, or masses Cardiac: irregular; no murmurs, rubs, or gallops,no edema  Respiratory:  clear to auscultation bilaterally, normal work of breathing GI: soft, nontender, nondistended, + BS MS: no deformity or atrophy  Skin: warm and dry Neuro:  Strength and sensation are intact Psych: euthymic mood, full affect  EKG:  EKG is ordered today. Personal review of the ekg ordered today shows atrial fibrillation, PVCs, rate 86  Recent  Labs: 12/20/2015: BUN 25; Creatinine, Ser 0.77; Hemoglobin 12.2; Platelets 191; Potassium 4.1; Sodium 139    Lipid Panel  No results found for: CHOL, TRIG, HDL, CHOLHDL, VLDL, LDLCALC, LDLDIRECT   Wt Readings from Last 3 Encounters:  07/07/16 163 lb 3.2 oz (74 kg)  05/21/16 163 lb 12.8 oz (74.3 kg)  12/18/15 165 lb 6 oz (75 kg)      Other studies Reviewed: Additional studies/ records that were reviewed today include: TTE 2013  Review of the above records today demonstrates:  - Left ventricle: The cavity size was normal. Wall thickness was increased in a pattern of mild LVH. Systolic function was normal. The estimated ejection fraction was in the range of 50% to 55%. Wall motion was normal; there were no regional wall motion abnormalities. Features are consistent with a pseudonormal left ventricular filling pattern, with concomitant abnormal relaxation and increased filling pressure (grade 2 diastolic dysfunction). - Aortic valve: Mild regurgitation. - Mitral valve: Mild regurgitation. - Left atrium: The atrium was mildly dilated. - Right atrium: The atrium was mildly dilated. - Tricuspid valve: Mild-moderate regurgitation. - Pulmonary arteries: PA peak pressure: 10mm Hg (S).   ASSESSMENT AND  PLAN:  1.  Chronic atrial fibrillation: Currently takes rivaroxaban. She is continuing to have significant fatigue and palpitations even after her metoprolol was stopped. Due to that, we will attempt a rhythm control strategy. This may be difficult as she has been in atrial fibrillation for quite some time. We will start her on amiodarone 400 mg twice a day for a week followed by 200 mg a day. After an amiodarone load, will plan for cardioversion.   This patients CHA2DS2-VASc Score and unadjusted Ischemic Stroke Rate (% per year) is equal to 4.8 % stroke rate/year from a score of 4  Above score calculated as 1 point each if present [CHF, HTN, DM, Vascular=MI/PAD/Aortic Plaque, Age if 65-74, or Female] Above score calculated as 2 points each if present [Age > 75, or Stroke/TIA/TE]   2. Hypertension: On Diovan and clonidine. Her blood pressure remains elevated today. I've asked her to increase the clonidine to 0.1 mg twice daily.    Current medicines are reviewed at length with the patient today.   The patient does not have concerns regarding her medicines.  The following changes were made today:  Start amiodarone  Labs/ tests ordered today include:  No orders of the defined types were placed in this encounter.    Disposition:   FU with Will Camnitz 6 months  Signed, Will Meredith Leeds, MD  07/07/2016 3:24 PM     [place] [address] [phone] (office) [phone] (fax)

## 2016-07-07 ENCOUNTER — Encounter: Payer: Self-pay | Admitting: *Deleted

## 2016-07-07 ENCOUNTER — Ambulatory Visit (INDEPENDENT_AMBULATORY_CARE_PROVIDER_SITE_OTHER): Payer: Medicare Other | Admitting: Cardiology

## 2016-07-07 ENCOUNTER — Encounter: Payer: Self-pay | Admitting: Cardiology

## 2016-07-07 VITALS — BP 156/84 | HR 86 | Ht 64.0 in | Wt 163.2 lb

## 2016-07-07 DIAGNOSIS — Z01812 Encounter for preprocedural laboratory examination: Secondary | ICD-10-CM

## 2016-07-07 DIAGNOSIS — I481 Persistent atrial fibrillation: Secondary | ICD-10-CM | POA: Diagnosis not present

## 2016-07-07 DIAGNOSIS — I4819 Other persistent atrial fibrillation: Secondary | ICD-10-CM

## 2016-07-07 MED ORDER — AMIODARONE HCL 200 MG PO TABS: 400.0000 mg | ORAL_TABLET | Freq: Two times a day (BID) | ORAL | 0 refills | Status: DC

## 2016-07-07 MED ORDER — AMIODARONE HCL 200 MG PO TABS: 200.0000 mg | ORAL_TABLET | Freq: Every day | ORAL | 6 refills | Status: DC

## 2016-07-07 NOTE — Patient Instructions (Signed)
Medication Instructions:    Your physician has recommended you make the following change in your medication:   1) START Amiodarone   - take 2 tablets (400 mg total) twice a day for one week, then reduce and    - take 1 tablet (200 mg total) twice a day for one week, then reduce and   - take 1 tablet (200 mg total) once a day  --- If you need a refill on your cardiac medications before your next appointment, please call your pharmacy. ---  Labwork:  Your physician recommends that you return for pre procedure lab work between dates of: Sept 4-8th  Testing/Procedures: Your physician has recommended that you have a Cardioversion (DCCV). Electrical Cardioversion uses a jolt of electricity to your heart either through paddles or wired patches attached to your chest. This is a controlled, usually prescheduled, procedure. Defibrillation is done under light anesthesia in the hospital, and you usually go home the day of the procedure. This is done to get your heart back into a normal rhythm. You are not awake for the procedure. Please see the instruction sheet given to you today.  Follow-Up:  Your physician wants you to follow-up in: 6 months with Dr. Curt Bears. You will receive a reminder letter in the mail two months in advance. If you don't receive a letter, please call our office to schedule the follow-up appointment.  Any Other Special Instructions Will Be Listed Below (If Applicable).     Thank you for choosing [facility]!!   Trinidad Curet, RN [phone]

## 2016-07-15 ENCOUNTER — Other Ambulatory Visit: Payer: Medicare Other | Admitting: *Deleted

## 2016-07-15 DIAGNOSIS — Z01812 Encounter for preprocedural laboratory examination: Secondary | ICD-10-CM

## 2016-07-15 DIAGNOSIS — I4819 Other persistent atrial fibrillation: Secondary | ICD-10-CM

## 2016-07-15 LAB — BASIC METABOLIC PANEL
BUN: 32 mg/dL — ABNORMAL HIGH (ref 7–25)
CALCIUM: 9.7 mg/dL (ref 8.6–10.4)
CO2: 30 mmol/L (ref 20–31)
CREATININE: 1.24 mg/dL — AB (ref 0.60–0.88)
Chloride: 103 mmol/L (ref 98–110)
Glucose, Bld: 69 mg/dL (ref 65–99)
Potassium: 5.2 mmol/L (ref 3.5–5.3)
Sodium: 140 mmol/L (ref 135–146)

## 2016-07-15 LAB — CBC WITH DIFFERENTIAL/PLATELET
BASOS ABS: 0 {cells}/uL (ref 0–200)
Basophils Relative: 0 %
EOS PCT: 2 %
Eosinophils Absolute: 110 cells/uL (ref 15–500)
HCT: 39.9 % (ref 35.0–45.0)
Hemoglobin: 14 g/dL (ref 11.7–15.5)
Lymphocytes Relative: 30 %
Lymphs Abs: 1650 cells/uL (ref 850–3900)
MCH: 32.2 pg (ref 27.0–33.0)
MCHC: 35.1 g/dL (ref 32.0–36.0)
MCV: 91.7 fL (ref 80.0–100.0)
MONOS PCT: 8 %
MPV: 9.2 fL (ref 7.5–12.5)
Monocytes Absolute: 440 cells/uL (ref 200–950)
NEUTROS ABS: 3300 {cells}/uL (ref 1500–7800)
NEUTROS PCT: 60 %
PLATELETS: 182 10*3/uL (ref 140–400)
RBC: 4.35 MIL/uL (ref 3.80–5.10)
RDW: 12.6 % (ref 11.0–15.0)
WBC: 5.5 10*3/uL (ref 3.8–10.8)

## 2016-07-17 ENCOUNTER — Other Ambulatory Visit: Payer: Self-pay | Admitting: *Deleted

## 2016-07-17 DIAGNOSIS — R7989 Other specified abnormal findings of blood chemistry: Secondary | ICD-10-CM

## 2016-07-17 DIAGNOSIS — R799 Abnormal finding of blood chemistry, unspecified: Secondary | ICD-10-CM

## 2016-07-17 NOTE — Progress Notes (Signed)
Notes Recorded by Will Meredith Leeds, MD on 07/16/2016 at 8:32 AM EDT Creatinine mildly elevated, should be checked in one week.   Scheduled for 07/24/2016

## 2016-07-24 ENCOUNTER — Encounter (INDEPENDENT_AMBULATORY_CARE_PROVIDER_SITE_OTHER): Payer: Self-pay

## 2016-07-24 ENCOUNTER — Other Ambulatory Visit: Payer: Medicare Other | Admitting: *Deleted

## 2016-07-24 DIAGNOSIS — R799 Abnormal finding of blood chemistry, unspecified: Secondary | ICD-10-CM

## 2016-07-24 DIAGNOSIS — R7989 Other specified abnormal findings of blood chemistry: Secondary | ICD-10-CM

## 2016-07-25 ENCOUNTER — Ambulatory Visit (HOSPITAL_COMMUNITY): Payer: Medicare Other | Admitting: Anesthesiology

## 2016-07-25 ENCOUNTER — Encounter (HOSPITAL_COMMUNITY): Payer: Self-pay

## 2016-07-25 ENCOUNTER — Encounter (HOSPITAL_COMMUNITY)
Admission: RE | Disposition: A | Discharge status: Home / Self Care | Payer: Self-pay | Source: Ambulatory Visit | Attending: Cardiology

## 2016-07-25 ENCOUNTER — Ambulatory Visit (HOSPITAL_COMMUNITY)
Admission: RE | Admit: 2016-07-25 | Discharge: 2016-07-25 | Disposition: A | Discharge status: Home / Self Care | Payer: Medicare Other | Source: Ambulatory Visit | Attending: Cardiology | Admitting: Cardiology

## 2016-07-25 ENCOUNTER — Telehealth: Payer: Self-pay | Admitting: Cardiology

## 2016-07-25 DIAGNOSIS — Z7901 Long term (current) use of anticoagulants: Secondary | ICD-10-CM | POA: Diagnosis not present

## 2016-07-25 DIAGNOSIS — Z96642 Presence of left artificial hip joint: Secondary | ICD-10-CM | POA: Diagnosis not present

## 2016-07-25 DIAGNOSIS — I482 Chronic atrial fibrillation: Secondary | ICD-10-CM | POA: Insufficient documentation

## 2016-07-25 DIAGNOSIS — Z85828 Personal history of other malignant neoplasm of skin: Secondary | ICD-10-CM | POA: Insufficient documentation

## 2016-07-25 DIAGNOSIS — E785 Hyperlipidemia, unspecified: Secondary | ICD-10-CM | POA: Insufficient documentation

## 2016-07-25 DIAGNOSIS — M1990 Unspecified osteoarthritis, unspecified site: Secondary | ICD-10-CM | POA: Diagnosis not present

## 2016-07-25 DIAGNOSIS — I10 Essential (primary) hypertension: Secondary | ICD-10-CM | POA: Diagnosis not present

## 2016-07-25 DIAGNOSIS — I480 Paroxysmal atrial fibrillation: Secondary | ICD-10-CM | POA: Insufficient documentation

## 2016-07-25 DIAGNOSIS — K219 Gastro-esophageal reflux disease without esophagitis: Secondary | ICD-10-CM | POA: Diagnosis not present

## 2016-07-25 DIAGNOSIS — I739 Peripheral vascular disease, unspecified: Secondary | ICD-10-CM | POA: Insufficient documentation

## 2016-07-25 HISTORY — PX: CARDIOVERSION: SHX1299

## 2016-07-25 LAB — BASIC METABOLIC PANEL
BUN: 25 mg/dL (ref 7–25)
CALCIUM: 9.6 mg/dL (ref 8.6–10.4)
CHLORIDE: 99 mmol/L (ref 98–110)
CO2: 28 mmol/L (ref 20–31)
CREATININE: 1.38 mg/dL — AB (ref 0.60–0.88)
Glucose, Bld: 134 mg/dL — ABNORMAL HIGH (ref 65–99)
Potassium: 4.3 mmol/L (ref 3.5–5.3)
Sodium: 137 mmol/L (ref 135–146)

## 2016-07-25 SURGERY — CARDIOVERSION
Anesthesia: Monitor Anesthesia Care

## 2016-07-25 MED ORDER — SODIUM CHLORIDE 0.9 % IV SOLN
INTRAVENOUS | Status: DC | PRN
  Administered 2016-07-25: 10:00:00 via INTRAVENOUS

## 2016-07-25 MED ORDER — PROPOFOL 10 MG/ML IV BOLUS
INTRAVENOUS | Status: DC | PRN
  Administered 2016-07-25: 60 mg via INTRAVENOUS

## 2016-07-25 MED ORDER — SODIUM CHLORIDE 0.9 % IV SOLN
INTRAVENOUS | Status: DC
  Administered 2016-07-25: 10:00:00 via INTRAVENOUS

## 2016-07-25 MED ORDER — LIDOCAINE HCL (CARDIAC) 20 MG/ML IV SOLN
INTRAVENOUS | Status: DC | PRN
Start: 2016-07-25 — End: 2016-07-25
  Administered 2016-07-25: 20 mg via INTRATRACHEAL

## 2016-07-25 NOTE — Transfer of Care (Signed)
Immediate Anesthesia Transfer of Care Note  Patient: [name]  Procedure(s) Performed: Procedure(s): CARDIOVERSION (N/A)  Patient Location: Endoscopy Unit  Anesthesia Type:MAC  Level of Consciousness: awake, alert  and oriented  Airway & Oxygen Therapy: Patient Spontanous Breathing and Patient connected to nasal cannula oxygen  Post-op Assessment: Report given to RN, Post -op Vital signs reviewed and stable and Patient moving all extremities X 4  Post vital signs: Reviewed and stable  Last Vitals:  Vitals:   07/25/16 0913  BP: (!) 171/80  Pulse: 62  Resp: 17    Last Pain: There were no vitals filed for this visit.       Complications: No apparent anesthesia complications

## 2016-07-25 NOTE — Procedures (Signed)
Electrical Cardioversion Procedure Note [name] [id number] 01/14/1930  Procedure: Electrical Cardioversion Indications:  Atrial Fibrillation  Procedure Details Consent: Risks of procedure as well as the alternatives and risks of each were explained to the (patient/caregiver).  Consent for procedure obtained. Time Out: Verified patient identification, verified procedure, site/side was marked, verified correct patient position, special equipment/implants available, medications/allergies/relevent history reviewed, required imaging and test results available.  Performed  Patient placed on cardiac monitor, pulse oximetry, supplemental oxygen as necessary.  Sedation given: Patient sedated by anesthesia with lidocaine 20 mg and diprovan 60 mg IV. Pacer pads placed anterior and posterior chest.  Cardioverted 1 time(s).  Cardioverted at 120J.  Evaluation Findings: Post procedure EKG shows: NSR Complications: None Patient did tolerate procedure well.   [name] 07/25/2016, 9:04 AM

## 2016-07-25 NOTE — Anesthesia Postprocedure Evaluation (Signed)
Anesthesia Post Note  Patient: [name]  Procedure(s) Performed: Procedure(s) (LRB): CARDIOVERSION (N/A)  Patient location during evaluation: Endoscopy Anesthesia Type: MAC Level of consciousness: awake and alert, oriented and patient cooperative Pain management: pain level controlled Vital Signs Assessment: post-procedure vital signs reviewed and stable Respiratory status: spontaneous breathing, nonlabored ventilation and respiratory function stable Cardiovascular status: blood pressure returned to baseline and stable Postop Assessment: no signs of nausea or vomiting Anesthetic complications: no    Last Vitals:  Vitals:   07/25/16 0913 07/25/16 1010  BP: (!) 171/80 (!) 142/58  Pulse: 62 (!) 46  Resp: 17 14    Last Pain: There were no vitals filed for this visit.               Midge Minium

## 2016-07-25 NOTE — Telephone Encounter (Signed)
I cannot view/review procedure notes at this time.  DPR indicates ok to talk to either daughter Butch Penny or [name]. No phone number left to return call. Pt's home phone rings w/o answer.

## 2016-07-25 NOTE — H&P (Signed)
[name]  07/07/2016 2:45 PM  Office Visit  MRN:  [id number]  Description: Female DOB: October 10, 1930 Provider: Constance Haw, MD Department: [facility]  Vitals   BP    156/84   Pulse  86   Ht  5\' 4"  (1.626 m)   Wt  163 lb 3.2 oz (74 kg)   BMI  28.01 kg/m    Progress Notes   Will Meredith Leeds, MD at 07/07/2016 2:45 PM   Status: Signed  Expand All Collapse All       Electrophysiology Office Note   Date:  07/07/2016   ID:  [name], DOB January 02, 1930, MRN [id number]  PCP:  Chesley Noon, MD      Primary Electrophysiologist:  Constance Haw, MD           Chief Complaint  Patient presents with  . Follow-up    3 months  . Atrial Fibrillation     History of Present Illness: [name] is a 82 y.o. female who presents today for electrophysiology evaluation.   She has a history of chronic atrial fibrillation, hypertension. Currently, she is on rivaroxaban for anticoagulation. She continues to feel weak and fatigued. She says that she has some good days and some bad days. On the bad days, she has difficulty doing her daily activities. She has many responsibilities in the home and she does not have the energy to take care of them on her bad days.   Today, she denies symptoms of chest pain, PND, lower extremity edema, claudication, dizziness, presyncope, syncope, bleeding, or neurologic sequela. The patient is tolerating medications without difficulties and is otherwise without complaint today.        Past Medical History:  Diagnosis Date  . Aortic valve sclerosis   . Bradycardia   . 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 Chronic cough   . Dysrhythmia    A Fib  . Edema of lower extremity   . GERD (gastroesophageal reflux disease)   . H/O one miscarriage   . Hard of hearing    has hearing aides but does not wear   . Heart murmur   . Hip pain    left  . History of bladder infections     08/2015  . History of blood transfusion   . History of hiatal hernia   . History of kidney stones   . Hyperlipidemia   . Hypertension   . Leg pain, left   . OA (osteoarthritis)   . Obesity   . Renal insufficiency   . Tinnitus   . Urinary incontinence   . Varicose veins   . Wears glasses         Past Surgical History:  Procedure Laterality Date  . BREAST SURGERY     cyst removed per right breast 1977  . CARDIOVASCULAR STRESS TEST  06/25/2006   EF 62%  . CYST REMOVAL FROM OVARY  2002  . DILATION AND CURETTAGE OF UTERUS    . EYE SURGERY     cataract surgery bilat   . NASAL SEPTUM SURGERY    . TOTAL HIP ARTHROPLASTY Left 12/18/2015   Procedure: LEFT TOTAL HIP ARTHROPLASTY ANTERIOR APPROACH;  Surgeon: Paralee Cancel, MD;  Location: [facility];  Service: Orthopedics;  Laterality: Left;           Current Outpatient Prescriptions  Medication Sig Dispense Refill  . acetaminophen (TYLENOL) 500 MG tablet Take 500 mg by mouth  every 6 (six) hours as needed (pain).    Marland Kitchen ALPRAZolam (XANAX) 0.5 MG tablet Take 0.25-0.5 mg by mouth 3 (three) times daily as needed for anxiety.     . busPIRone (BUSPAR) 5 MG tablet Take 5 mg by mouth 2 (two) times daily.    . cloNIDine (CATAPRES) 0.1 MG tablet Take 0.1 mg by mouth at bedtime.     Marland Kitchen DIOVAN HCT 320-25 MG per tablet Take 1 tablet by mouth Daily.    . Multiple Vitamin (MULTIVITAMIN) tablet Take 1 tablet by mouth daily.      . polyethylene glycol (MIRALAX / GLYCOLAX) packet Take 17 g by mouth 2 (two) times daily. 14 each 0  . polyvinyl alcohol (LIQUIFILM TEARS) 1.4 % ophthalmic solution Place 1 drop into both eyes 3 (three) times daily as needed for dry eyes.    . rivaroxaban (XARELTO) 20 MG TABS tablet Take 20 mg by mouth daily.     No current facility-administered medications for this visit.     Allergies:   Ace inhibitors; Celebrex [celecoxib]; Codeine; Norvasc [amlodipine besylate]; and Vioxx  [rofecoxib]   Social History:  The patient  reports that she has never smoked. She has never used smokeless tobacco. She reports that she does not drink alcohol or use drugs.   Family History:  The patient's family history includes Diabetes in her mother; Heart disease in her father; Hypertension in her brother and sister.    ROS:  Please see the history of present illness.   Otherwise, review of systems is positive for hearing loss, anxiety.   All other systems are reviewed and negative.    PHYSICAL EXAM: VS:  BP (!) 156/84   Pulse 86   Ht 5\' 4"  (1.626 m)   Wt 163 lb 3.2 oz (74 kg)   BMI 28.01 kg/m  , BMI Body mass index is 28.01 kg/m. GEN: Well nourished, well developed, in no acute distress  HEENT: normal  Neck: no JVD, carotid bruits, or masses Cardiac: irregular; no murmurs, rubs, or gallops,no edema  Respiratory:  clear to auscultation bilaterally, normal work of breathing GI: soft, nontender, nondistended, + BS MS: no deformity or atrophy  Skin: warm and dry Neuro:  Strength and sensation are intact Psych: euthymic mood, full affect  EKG:  EKG is ordered today. Personal review of the ekg ordered today shows atrial fibrillation, PVCs, rate 86  Recent  Labs: 12/20/2015: BUN 25; Creatinine, Ser 0.77; Hemoglobin 12.2; Platelets 191; Potassium 4.1; Sodium 139    Lipid Panel  Labs (Brief)  No results found for: CHOL, TRIG, HDL, CHOLHDL, VLDL, LDLCALC, LDLDIRECT        Wt Readings from Last 3 Encounters:  07/07/16 163 lb 3.2 oz (74 kg)  05/21/16 163 lb 12.8 oz (74.3 kg)  12/18/15 165 lb 6 oz (75 kg)      Other studies Reviewed: Additional studies/ records that were reviewed today include: TTE 2013  Review of the above records today demonstrates:  - Left ventricle: The cavity size was normal. Wall thickness was increased in a pattern of mild LVH. Systolic function was normal. The estimated ejection fraction was in the range of 50% to 55%. Wall  motion was normal; there were no regional wall motion abnormalities. Features are consistent with a pseudonormal left ventricular filling pattern, with concomitant abnormal relaxation and increased filling pressure (grade 2 diastolic dysfunction). - Aortic valve: Mild regurgitation. - Mitral valve: Mild regurgitation. - Left atrium: The atrium was mildly dilated. - Right  atrium: The atrium was mildly dilated. - Tricuspid valve: Mild-moderate regurgitation. - Pulmonary arteries: PA peak pressure: 36mm Hg (S).   ASSESSMENT AND PLAN:  1.  Chronic atrial fibrillation: Currently takes rivaroxaban. She is continuing to have significant fatigue and palpitations even after her metoprolol was stopped. Due to that, we will attempt a rhythm control strategy. This may be difficult as she has been in atrial fibrillation for quite some time. We will start her on amiodarone 400 mg twice a day for a week followed by 200 mg a day. After an amiodarone load, will plan for cardioversion.   This patients CHA2DS2-VASc Score and unadjusted Ischemic Stroke Rate (% per year) is equal to 4.8 % stroke rate/year from a score of 4  Above score calculated as 1 point each if present [CHF, HTN, DM, Vascular=MI/PAD/Aortic Plaque, Age if 65-74, or Female] Above score calculated as 2 points each if present [Age > 75, or Stroke/TIA/TE]   2. Hypertension: On Diovan and clonidine. Her blood pressure remains elevated today. I've asked her to increase the clonidine to 0.1 mg twice daily.    Current medicines are reviewed at length with the patient today.   The patient does not have concerns regarding her medicines.  The following changes were made today:  Start amiodarone  Labs/ tests ordered today include:  No orders of the defined types were placed in this encounter.    Disposition:   FU with Will Camnitz 6 months  Signed, Will Meredith Leeds, MD  07/07/2016 3:24 PM     [facility] [address] [place] [address] [phone] (office) [phone] (fax)     For DCCV; on xarelto; no changes [name]

## 2016-07-25 NOTE — Discharge Instructions (Signed)
Electrical Cardioversion, Care After °Refer to this sheet in the next few weeks. These instructions provide you with information on caring for yourself after your procedure. Your health care provider may also give you more specific instructions. Your treatment has been planned according to current medical practices, but problems sometimes occur. Call your health care provider if you have any problems or questions after your procedure. °WHAT TO EXPECT AFTER THE PROCEDURE °After your procedure, it is typical to have the following sensations: °· Some redness on the skin where the shocks were delivered. If this is tender, a sunburn lotion or hydrocortisone cream may help. °· Possible return of an abnormal heart rhythm within hours or days after the procedure. °HOME CARE INSTRUCTIONS °· Take medicines only as directed by your health care provider. Be sure you understand how and when to take your medicine. °· Learn how to feel your pulse and check it often. °· Limit your activity for 48 hours after the procedure or as directed by your health care provider. °· Avoid or minimize caffeine and other stimulants as directed by your health care provider. °SEEK MEDICAL CARE IF: °· You feel like your heart is beating too fast or your pulse is not regular. °· You have any questions about your medicines. °· You have bleeding that will not stop. °SEEK IMMEDIATE MEDICAL CARE IF: °· You are dizzy or feel faint. °· It is hard to breathe or you feel short of breath. °· There is a change in discomfort in your chest. °· Your speech is slurred or you have trouble moving an arm or leg on one side of your body. °· You get a serious muscle cramp that does not go away. °· Your fingers or toes turn cold or blue. °  °This information is not intended to replace advice given to you by your health care provider. Make sure you discuss any questions you have with your health care provider. °  °Document Released: 08/17/2013 Document Revised: 11/17/2014  Document Reviewed: 08/17/2013 °Elsevier Interactive Patient Education ©2016 [employer] ° °

## 2016-07-25 NOTE — Anesthesia Preprocedure Evaluation (Addendum)
Anesthesia Evaluation  Patient identified by MRN, date of birth, ID band Patient awake    Reviewed: Allergy & Precautions, H&P , NPO status , Patient's Chart, lab work & pertinent test results  History of Anesthesia Complications Negative for: history of anesthetic complications  Airway Mallampati: II  TM Distance: >3 FB Neck ROM: full    Dental  (+) Dental Advidsory Given, Teeth Intact   Pulmonary neg pulmonary ROS,    breath sounds clear to auscultation       Cardiovascular hypertension, Pt. on medications + Peripheral Vascular Disease  + dysrhythmias Atrial Fibrillation + Valvular Problems/Murmurs  Rhythm:irregular Rate:Normal  1/17 ECHO:  EF 55-60%, MVP with mod MR, mod TR   Neuro/Psych    GI/Hepatic Neg liver ROS, hiatal hernia, GERD  Medicated and Controlled,  Endo/Other  negative endocrine ROS  Renal/GU negative Renal ROS     Musculoskeletal  (+) Arthritis ,   Abdominal   Peds  Hematology negative hematology ROS (+)   Anesthesia Other Findings Study Conclusions  - Left ventricle: The cavity size was normal. Wall thickness was increased in a pattern of mild LVH. Systolic function was normal. The estimated ejection fraction was in the range of 50% to 55%. Wall motion was normal; there were no regional wall motion abnormalities. Features are consistent with a pseudonormal left ventricular filling pattern, with concomitant abnormal relaxation and increased filling pressure (grade 2 diastolic dysfunction). - Aortic valve: Mild regurgitation. - Mitral valve: Mild regurgitation. - Left atrium: The atrium was mildly dilated. - Right atrium: The atrium was mildly dilated. - Tricuspid valve: Mild-moderate regurgitation. - Pulmonary arteries: PA peak pressure: 20mm Hg (S). Transthoracic echocardiography. M-mode, complete 2D, spectral Doppler, and color Doppler. Height: Height: 162.6cm.  Height: 64in. Weight: Weight: 83.5kg. Weight: 183.6lb. Body mass index: BMI: 31.6kg/m^2. Body surface area:  BSA: 1.4m^2. Blood pressure:   128/68  Reproductive/Obstetrics                           Anesthesia Physical Anesthesia Plan  ASA: III  Anesthesia Plan: MAC   Post-op Pain Management:    Induction: Intravenous  Airway Management Planned: Mask  Additional Equipment:   Intra-op Plan:   Post-operative Plan:   Informed Consent: I have reviewed the patients History and Physical, chart, labs and discussed the procedure including the risks, benefits and alternatives for the proposed anesthesia with the patient or authorized representative who has indicated his/her understanding and acceptance.   Dental Advisory Given  Plan Discussed with: Anesthesiologist, CRNA and Surgeon  Anesthesia Plan Comments: (Plan routine monitors, MAC)       Anesthesia Quick Evaluation

## 2016-07-25 NOTE — Telephone Encounter (Signed)
New Message  Pts daughter voiced for someone to contact her back about the procedure her mom had this morning.  Please f/u with pts daughter.

## 2016-07-25 NOTE — Anesthesia Procedure Notes (Signed)
Procedure Name: MAC Date/Time: 07/25/2016 9:57 AM Performed by: Neldon Newport Pre-anesthesia Checklist: Timeout performed, Patient being monitored, Suction available, Emergency Drugs available and Patient identified Oxygen Delivery Method: Ambu bag and Nasal cannula Preoxygenation: Pre-oxygenation with 100% oxygen Placement Confirmation: positive ETCO2

## 2016-07-28 ENCOUNTER — Encounter: Payer: Self-pay | Admitting: Cardiology

## 2016-07-28 ENCOUNTER — Other Ambulatory Visit: Payer: Self-pay | Admitting: *Deleted

## 2016-07-28 ENCOUNTER — Encounter (HOSPITAL_COMMUNITY): Payer: Self-pay | Admitting: Cardiology

## 2016-07-28 DIAGNOSIS — R748 Abnormal levels of other serum enzymes: Secondary | ICD-10-CM

## 2016-07-28 DIAGNOSIS — R7989 Other specified abnormal findings of blood chemistry: Secondary | ICD-10-CM

## 2016-07-28 NOTE — Telephone Encounter (Signed)
This encounter was created in error - please disregard.

## 2016-07-28 NOTE — Telephone Encounter (Signed)
Follow Up:; ° ° °Returning your call. °

## 2016-07-29 NOTE — Telephone Encounter (Signed)
No answer on patient's home line. No number available for daughter.

## 2016-08-01 ENCOUNTER — Telehealth: Payer: Self-pay | Admitting: *Deleted

## 2016-08-01 NOTE — Telephone Encounter (Signed)
Followed up with patient post DCCV/Amiodarone start.  Patient tells me she "feels so much better".  She even "messed around in the yard" recently - and she had not been able to this in awhile. Advised to call office if she begins to experience issues, otherwise we will see her in February for 6 mo follow up. Patient verbalized understanding and agreeable to plan.

## 2016-08-04 ENCOUNTER — Other Ambulatory Visit: Payer: Medicare Other | Admitting: *Deleted

## 2016-08-04 DIAGNOSIS — R7989 Other specified abnormal findings of blood chemistry: Secondary | ICD-10-CM

## 2016-08-04 LAB — BASIC METABOLIC PANEL
BUN: 23 mg/dL (ref 7–25)
CALCIUM: 9.9 mg/dL (ref 8.6–10.4)
CO2: 29 mmol/L (ref 20–31)
Chloride: 101 mmol/L (ref 98–110)
Creat: 1.2 mg/dL — ABNORMAL HIGH (ref 0.60–0.88)
Glucose, Bld: 120 mg/dL — ABNORMAL HIGH (ref 65–99)
Potassium: 4.6 mmol/L (ref 3.5–5.3)
SODIUM: 139 mmol/L (ref 135–146)

## 2016-08-14 ENCOUNTER — Telehealth: Payer: Self-pay | Admitting: Cardiology

## 2016-08-14 NOTE — Telephone Encounter (Signed)
No answer and no VM

## 2016-08-14 NOTE — Telephone Encounter (Signed)
New message  Pt call stating she received a call from the RN. Pt states she thinks it could have been about her lab results. Please call back to discuss

## 2016-08-14 NOTE — Telephone Encounter (Signed)
Notified of lab results.  Will send copy to Dr. Melford Aase. Advised to watch her sweets since glucose was elevated.

## 2016-08-15 ENCOUNTER — Telehealth: Payer: Self-pay | Admitting: Cardiology

## 2016-08-15 NOTE — Telephone Encounter (Signed)
New Message  Pt c/o medication issue:  1. Name of Medication: amiodarone  2. How are you currently taking this medication (dosage and times per day)? 200 mg tablet twice daily  3. Are you having a reaction (difficulty breathing--STAT)? No  4. What is your medication issue? Pt voiced had procedure and shouldn't know if she should take it

## 2016-08-15 NOTE — Telephone Encounter (Signed)
Advised patient that she should remain taking Amiodarone. Patient feels that she may be still flipping in and out of AFib.  She sees her PCP next Wednesday and will have an EKG there.  If she is out of rhythm or having symptoms she will call office next week to arrange OV w/ Camnitz to discuss.  She verbalized understanding and thanks me for calling and discussing with her.

## 2016-08-27 ENCOUNTER — Telehealth: Payer: Self-pay | Admitting: Cardiology

## 2016-08-27 NOTE — Telephone Encounter (Signed)
New message      Pt saw PCP last Monday.  Calling to give info on that visit.  Ok to call tomorrow

## 2016-08-29 ENCOUNTER — Ambulatory Visit: Payer: Medicare Other | Admitting: Cardiology

## 2016-08-29 NOTE — Telephone Encounter (Signed)
Wanted [place] to know that she saw PCP who told her she was still "in rhythm".  She tells me she is slowly recovering.   She understands to call office if she begins to have symptoms before her next follow up with Dr. Curt Bears. She thanks me for calling.

## 2016-10-17 ENCOUNTER — Emergency Department (HOSPITAL_COMMUNITY)
Admission: EM | Admit: 2016-10-17 | Discharge: 2016-10-18 | Disposition: A | Discharge status: Home / Self Care | Payer: Medicare Other | Attending: Emergency Medicine | Admitting: Emergency Medicine

## 2016-10-17 ENCOUNTER — Encounter (HOSPITAL_COMMUNITY): Payer: Self-pay | Admitting: Emergency Medicine

## 2016-10-17 DIAGNOSIS — I10 Essential (primary) hypertension: Secondary | ICD-10-CM | POA: Insufficient documentation

## 2016-10-17 DIAGNOSIS — Z96642 Presence of left artificial hip joint: Secondary | ICD-10-CM | POA: Diagnosis not present

## 2016-10-17 DIAGNOSIS — R319 Hematuria, unspecified: Secondary | ICD-10-CM | POA: Diagnosis present

## 2016-10-17 DIAGNOSIS — Z7901 Long term (current) use of anticoagulants: Secondary | ICD-10-CM | POA: Insufficient documentation

## 2016-10-17 DIAGNOSIS — N289 Disorder of kidney and ureter, unspecified: Secondary | ICD-10-CM | POA: Insufficient documentation

## 2016-10-17 DIAGNOSIS — Z85828 Personal history of other malignant neoplasm of skin: Secondary | ICD-10-CM | POA: Insufficient documentation

## 2016-10-17 NOTE — ED Triage Notes (Signed)
Per EMS. Pt was at a neighbors holiday party and started to have a full body tremor.  Neighbors took her BP and it was 790 systolic.  Pt c/o headache and nausea. Went back home and BP was 241/100.  Pt denies numbness or LOC.  Hx of AFib.   HR 56 CBG 115 99% SPO2

## 2016-10-18 LAB — CBC WITH DIFFERENTIAL/PLATELET
BASOS ABS: 0 10*3/uL (ref 0.0–0.1)
BASOS PCT: 1 %
EOS ABS: 0.1 10*3/uL (ref 0.0–0.7)
EOS PCT: 2 %
HCT: 41.6 % (ref 36.0–46.0)
Hemoglobin: 14.3 g/dL (ref 12.0–15.0)
Lymphocytes Relative: 31 %
Lymphs Abs: 1.7 10*3/uL (ref 0.7–4.0)
MCH: 31.4 pg (ref 26.0–34.0)
MCHC: 34.4 g/dL (ref 30.0–36.0)
MCV: 91.4 fL (ref 78.0–100.0)
MONO ABS: 0.5 10*3/uL (ref 0.1–1.0)
MONOS PCT: 10 %
Neutro Abs: 3.1 10*3/uL (ref 1.7–7.7)
Neutrophils Relative %: 56 %
PLATELETS: 199 10*3/uL (ref 150–400)
RBC: 4.55 MIL/uL (ref 3.87–5.11)
RDW: 13 % (ref 11.5–15.5)
WBC: 5.5 10*3/uL (ref 4.0–10.5)

## 2016-10-18 LAB — BASIC METABOLIC PANEL
ANION GAP: 9 (ref 5–15)
BUN: 25 mg/dL — ABNORMAL HIGH (ref 6–20)
CALCIUM: 9.6 mg/dL (ref 8.9–10.3)
CO2: 27 mmol/L (ref 22–32)
CREATININE: 1.49 mg/dL — AB (ref 0.44–1.00)
Chloride: 101 mmol/L (ref 101–111)
GFR calc Af Amer: 36 mL/min — ABNORMAL LOW (ref >60)
GFR, EST NON AFRICAN AMERICAN: 31 mL/min — AB (ref >60)
GLUCOSE: 122 mg/dL — AB (ref 65–99)
Potassium: 4 mmol/L (ref 3.5–5.1)
Sodium: 137 mmol/L (ref 135–145)

## 2016-10-18 LAB — URINALYSIS, ROUTINE W REFLEX MICROSCOPIC
BILIRUBIN URINE: NEGATIVE
Bacteria, UA: NONE SEEN
GLUCOSE, UA: NEGATIVE mg/dL
Ketones, ur: NEGATIVE mg/dL
Leukocytes, UA: NEGATIVE
NITRITE: NEGATIVE
PH: 7 (ref 5.0–8.0)
Protein, ur: 100 mg/dL — AB
SPECIFIC GRAVITY, URINE: 1.011 (ref 1.005–1.030)

## 2016-10-18 LAB — CK: CK TOTAL: 105 U/L (ref 38–234)

## 2016-10-18 NOTE — ED Provider Notes (Signed)
[facility] DEPT Provider Note   CSN: [id number] Arrival date & time: 10/17/16  2329  By signing my name below, I, Gwenlyn Fudge, attest that this documentation has been prepared under the direction and in the presence of Ripley Fraise, MD. Electronically Signed: Gwenlyn Fudge, ED Scribe. 10/18/16. 12:17 AM.   History   Chief Complaint Chief Complaint  Patient presents with  . Tremors   HPI Comments: [name] is a 82 y.o. female with PMHx of AFib, HTN and HLD who presents to the Emergency Department complaining of gradual onset, constant full body tremors onset earlier tonight. Pt reports associated headache, dark urine, increased blood pressure and 1 episode of hematuria. She states all symptoms except elevated blood pressure have gradually resolved. Headache is similar to a sinus headache. Pt's blood pressure earlier tonight was 241/100 and has slowly decreased, but her systolic remains above 465. She has recently been taking antibiotics from PCP for cough and congestion and noticed on 12/6 that she was producing dark "tea-colored" urine. Earlier this evening, pt had 1 episode of blood in her urine, but her urine returned to the dark colored urine she had before. She currently takes Xarelto for AFib. She has not taken any medications other than her antibiotic for cough/congestion. No hx of MI. Pt denies visual disturbance, extremity weakness or numbness, chest pain, shortness of breath, syncope, or abdominal pain.  2 months ago, pt had chronic AFib. Pt had cardioversion and has not had any troubles since then.    The history is provided by the patient and a relative. No language interpreter was used.    Past Medical History:  Diagnosis Date  . Aortic valve sclerosis   . Bradycardia   . 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 Chronic cough   . Dysrhythmia    A Fib  . Edema of lower extremity   . GERD (gastroesophageal reflux disease)   . H/O one miscarriage   .  Hard of hearing    has hearing aides but does not wear   . Heart murmur   . Hip pain    left  . History of bladder infections    08/2015  . History of blood transfusion   . History of hiatal hernia   . History of kidney stones   . Hyperlipidemia   . Hypertension   . Leg pain, left   . OA (osteoarthritis)   . Obesity   . Renal insufficiency   . Tinnitus   . Urinary incontinence   . Varicose veins   . Wears glasses     Patient Active Problem List   Diagnosis Date Noted  . Overweight (BMI 25.0-29.9) 12/19/2015  . S/P left THA, AA 12/18/2015  . Chronic venous insufficiency 11/16/2015  . Varicose veins of both lower extremities 11/16/2015  . Edema 05/20/2012  . Bradycardia   . Hypertension   . Hyperlipidemia     Past Surgical History:  Procedure Laterality Date  . BREAST SURGERY     cyst removed per right breast 1977  . CARDIOVASCULAR STRESS TEST  06/25/2006   EF 62%  . CARDIOVERSION N/A 07/25/2016   Procedure: CARDIOVERSION;  Surgeon: Lelon Perla, MD;  Location: [facility] ENDOSCOPY;  Service: Cardiovascular;  Laterality: N/A;  . CYST REMOVAL FROM OVARY  2002  . DILATION AND CURETTAGE OF UTERUS    . EYE SURGERY     cataract surgery bilat   . NASAL SEPTUM SURGERY    .  TOTAL HIP ARTHROPLASTY Left 12/18/2015   Procedure: LEFT TOTAL HIP ARTHROPLASTY ANTERIOR APPROACH;  Surgeon: Paralee Cancel, MD;  Location: [facility] ORS;  Service: Orthopedics;  Laterality: Left;    OB History    No data available       Home Medications    Prior to Admission medications   Medication Sig Start Date End Date Taking? Authorizing Provider  acetaminophen (TYLENOL) 500 MG tablet Take 500 mg by mouth every 6 (six) hours as needed (pain).    Historical Provider, MD  ALPRAZolam Duanne Moron) 0.5 MG tablet Take 0.25-0.5 mg by mouth 3 (three) times daily as needed for anxiety.     Historical Provider, MD  amiodarone (PACERONE) 200 MG tablet Take 2 tablets (400 mg total) by mouth 2 (two) times daily. For one  week.  Then take 1 tablet (200 mg total) 2 times daily for one week. 07/07/16   Will Meredith Leeds, MD  amiodarone (PACERONE) 200 MG tablet Take 1 tablet (200 mg total) by mouth daily. 07/07/16   Will Meredith Leeds, MD  busPIRone (BUSPAR) 5 MG tablet Take 5 mg by mouth 2 (two) times daily.    Historical Provider, MD  cloNIDine (CATAPRES) 0.1 MG tablet Take 0.1 mg by mouth at bedtime.     Historical Provider, MD  DIOVAN HCT 320-25 MG per tablet Take 1 tablet by mouth Daily. 08/05/11   Historical Provider, MD  Multiple Vitamin (MULTIVITAMIN) tablet Take 1 tablet by mouth daily.      Historical Provider, MD  polyethylene glycol (MIRALAX / GLYCOLAX) packet Take 17 g by mouth 2 (two) times daily. 12/19/15   Danae Orleans, PA-C  polyvinyl alcohol (LIQUIFILM TEARS) 1.4 % ophthalmic solution Place 1 drop into both eyes 3 (three) times daily as needed for dry eyes.    Historical Provider, MD  rivaroxaban (XARELTO) 20 MG TABS tablet Take 20 mg by mouth daily. 04/17/16   Historical Provider, MD    Family History Family History  Problem Relation Age of Onset  . Diabetes Mother   . Heart disease Father   . Hypertension Sister   . Hypertension Brother     Social History Social History  Substance Use Topics  . Smoking status: Never Smoker  . Smokeless tobacco: Never Used  . Alcohol use No     Allergies   Ace inhibitors; Celebrex [celecoxib]; Codeine; Norvasc [amlodipine besylate]; and Vioxx [rofecoxib]   Review of Systems Review of Systems  HENT: Positive for congestion. Negative for nosebleeds.   Eyes: Negative for visual disturbance.  Respiratory: Positive for cough. Negative for shortness of breath.   Cardiovascular: Negative for chest pain.  Gastrointestinal: Negative for abdominal pain and blood in stool.  Genitourinary: Positive for hematuria.  Neurological: Positive for tremors and headaches. Negative for syncope and weakness.  All other systems reviewed and are negative.  Physical  Exam Updated Vital Signs BP (!) 210/92   Pulse (!) 51   Temp 97.5 F (36.4 C) (Oral)   Resp 21   Ht 5\' 4"  (1.626 m)   Wt 160 lb (72.6 kg)   SpO2 98%   BMI 27.46 kg/m   Physical Exam CONSTITUTIONAL: Well developed/well nourished HEAD: Normocephalic/atraumatic EYES: EOMI/PERRL ENMT: Mucous membranes moist NECK: supple no meningeal signs SPINE/BACK:entire spine nontender CV: S1/S2 noted, no murmurs/rubs/gallops noted LUNGS: Lungs are clear to auscultation bilaterally, no apparent distress ABDOMEN: soft, nontender, no rebound or guarding, bowel sounds noted throughout abdomen GU:no cva tenderness NEURO: Pt is awake/alert/appropriate, moves all extremitiesx4.  No  facial droop. No arm or leg drift noted EXTREMITIES: pulses normal/equal, full ROM SKIN: warm, color normal PSYCH: no abnormalities of mood noted, alert and oriented to situation  ED Treatments / Results  DIAGNOSTIC STUDIES: Oxygen Saturation is 98% on RA, normal by my interpretation.    COORDINATION OF CARE: 12:16 AM Discussed treatment plan with pt at bedside which includes lab work including Urinalysis and BMP and pt agreed to plan.  Labs (all labs ordered are listed, but only abnormal results are displayed) Labs Reviewed  BASIC METABOLIC PANEL - Abnormal; Notable for the following:       Result Value   Glucose, Bld 122 (*)    BUN 25 (*)    Creatinine, Ser 1.49 (*)    GFR calc non Af Amer 31 (*)    GFR calc Af Amer 36 (*)    All other components within normal limits  URINALYSIS, ROUTINE W REFLEX MICROSCOPIC - Abnormal; Notable for the following:    APPearance HAZY (*)    Hgb urine dipstick LARGE (*)    Protein, ur 100 (*)    Squamous Epithelial / LPF 0-5 (*)    All other components within normal limits  CBC WITH DIFFERENTIAL/PLATELET  CK    EKG  EKG Interpretation  Date/Time:  Saturday October 18 2016 00:23:37 EST Ventricular Rate:  66 PR Interval:    QRS Duration: 119 QT Interval:  488 QTC  Calculation: 441 R Axis:   -40 Text Interpretation:  Sinus rhythm Supraventricular bigeminy Nonspecific IVCD with LAD Left ventricular hypertrophy Abnormal ekg Confirmed by Christy Gentles  MD, Elenore Rota ([id number]) on 10/18/2016 12:28:11 AM       Radiology No results found.  Procedures Procedures (including critical care time)  Medications Ordered in ED Medications - No data to display   Initial Impression / Assessment and Plan / ED Course  I have reviewed the triage vital signs and the nursing notes.  Pertinent labs results that were available during my care of the patient were reviewed by me and considered in my medical decision making (see chart for details).  Clinical Course    I personally performed the services described in this documentation, which was scribed in my presence. The recorded information has been reviewed and is accurate.      Pt stable Her main concern was hematuria and when she went home her BP was elevated and she called ambulance She did not take evening clonidine which could explain elevated BP Due to headache I offered CT head but she reports it is improving and similar to prior Lamonte Sakai that are sinus related She has no weakness to suggest stroke No CP at this time, and appears to be in sinus rhythm with some bigeminy She is well appearing/smiling, no distress She reports hematuria is improved and not gross in appearance Likely related to xarelto.  She will continue this as I feel benefit of taking anticoagulant outweighs risk from intermittent hematuria She has PCP f/u on 12/14 She will need urine and creatinine rechecked   Final Clinical Impressions(s) / ED Diagnoses   Final diagnoses:  Hematuria, unspecified type  Renal insufficiency  Hypertension, unspecified type    New Prescriptions New Prescriptions   No medications on file     Ripley Fraise, MD 10/18/16 [phone]

## 2016-10-18 NOTE — ED Notes (Signed)
Pt verbalized understanding discharge instructions and denies any further needs or questions at this time. VS stable, ambulatory and steady gait.   

## 2016-10-18 NOTE — Discharge Instructions (Signed)
PLEASE RECHECK YOUR URINE AND BASIC METABOLIC PANEL (KIDNEY FUNCTION) AT YOUR PRIMARY APPOINTMENT NEXT WEEK

## 2016-11-18 ENCOUNTER — Telehealth: Payer: Self-pay | Admitting: Cardiology

## 2016-11-18 NOTE — Telephone Encounter (Signed)
Spoke with pt's dtr who reports pt is not doing well.  States mom's BP is elevated, she is very weak and irregular heart rhythm.  Pt has recently been to PCP and they increased med for BP.  Dtr would like pt to be seen by cardiology to make sure issues are not heart related and to discuss BP. Offered appt w/ Renee this afternoon but dtr was unsure if mom could make that time.  Appt made to see Tommye Standard tomorrow to evaluate concerns. Dtr will call back if they would like the 2:00 this afternoon. She thanks me for helping

## 2016-11-18 NOTE — Telephone Encounter (Signed)
Ms. [name] (daughter) is calling on behalf of her mother([name]) because she states that [name] was advised to give a call if she needed to be seen and we would try to work her in. [name] states that the patient's BP is elevated. Please call, thanks.

## 2016-11-19 ENCOUNTER — Encounter (INDEPENDENT_AMBULATORY_CARE_PROVIDER_SITE_OTHER): Payer: Self-pay

## 2016-11-19 ENCOUNTER — Other Ambulatory Visit: Payer: Self-pay | Admitting: Physician Assistant

## 2016-11-19 ENCOUNTER — Ambulatory Visit (INDEPENDENT_AMBULATORY_CARE_PROVIDER_SITE_OTHER): Payer: Medicare Other | Admitting: Physician Assistant

## 2016-11-19 ENCOUNTER — Ambulatory Visit (INDEPENDENT_AMBULATORY_CARE_PROVIDER_SITE_OTHER): Payer: Medicare Other

## 2016-11-19 VITALS — BP 196/80 | HR 51 | Ht 64.0 in | Wt 162.0 lb

## 2016-11-19 DIAGNOSIS — I4819 Other persistent atrial fibrillation: Secondary | ICD-10-CM

## 2016-11-19 DIAGNOSIS — I481 Persistent atrial fibrillation: Secondary | ICD-10-CM | POA: Diagnosis not present

## 2016-11-19 DIAGNOSIS — R001 Bradycardia, unspecified: Secondary | ICD-10-CM

## 2016-11-19 DIAGNOSIS — I340 Nonrheumatic mitral (valve) insufficiency: Secondary | ICD-10-CM

## 2016-11-19 DIAGNOSIS — R5383 Other fatigue: Secondary | ICD-10-CM

## 2016-11-19 DIAGNOSIS — I10 Essential (primary) hypertension: Secondary | ICD-10-CM | POA: Diagnosis not present

## 2016-11-19 MED ORDER — CLONIDINE HCL 0.1 MG PO TABS: 0.1000 mg | ORAL_TABLET | Freq: Two times a day (BID) | ORAL | 4 refills | Status: DC

## 2016-11-19 MED ORDER — HYDRALAZINE HCL 10 MG PO TABS: 10.0000 mg | ORAL_TABLET | Freq: Three times a day (TID) | ORAL | 4 refills | Status: DC

## 2016-11-19 NOTE — Patient Instructions (Addendum)
Medication Instructions:   START TAKING CLONIDINE 0.1 MG TWO TIMES A DAY   START TAKING HYDRALAZINE 10 MG THREE TIMES A DAY    If you need a refill on your cardiac medications before your next appointment, please call your pharmacy.  Labwork: BMET CBC TSH  AND LFT    Testing/Procedures: FOR 2 WEEKS.Marland KitchenMarland KitchenYour physician has recommended that you wear an event monitor. Event monitors are medical devices that record the heart's electrical activity. Doctors most often [place] these monitors to diagnose arrhythmias. Arrhythmias are problems with the speed or rhythm of the heartbeat. The monitor is a small, portable device. You can wear one while you do your normal daily activities. This is usually used to diagnose what is causing palpitations/syncope (passing out).     Follow-Up: 4 TO 6 WEEKS  WITH RENEE URSUY     Any Other Special Instructions Will Be Listed Below (If Applicable).

## 2016-11-19 NOTE — Addendum Note (Signed)
Addended by: Janan Halter F on: 11/19/2016 05:11 PM   Modules accepted: Orders

## 2016-11-19 NOTE — Progress Notes (Signed)
Cardiology Office Note Date:  11/19/2016  Patient ID:  [name], DOB April 12, 1930, MRN [id number] PCP:  Chesley Noon, MD  Electrophysiologist: Dr. Curt Bears    Chief Complaint:  Persistent HTN  History of Present Illness: [name] is a 83 y.o. female with history of chronic AFib, HTN comes in today to be seen for Dr. Curt Bears, last seen by him in August, noting a dx of chronic AFib though with exertional intolerances thought to be secondary to her AF started on amiodarone and planned for DCCV which was done 07/25/16.  10/14/16 treated for pansinusitis [facility] 10/17/16 ER with weakness, tremulous HTN 200/100, missed a clonidine dose?? single episode hematuria  She comes today accompanied by her daughter and son-in-law.  She states she feels like she is anxious/tremulous at times and this seems to correlate with high BP, she has gotten numbers of SBP 160's-170's, and then over the weekend last weekend 200 again.  These numbers make her feel jittery/anxious.  She denies any CP, she has had some sensation of some palpitations early AM hours, though nothing like she had when in AF.  She does notice when checking her BP her HR can be in the high 40's low 50's.  No near syncope or syncope, but has no energy.  She tells me she doesn't feel well, she does not describe DOE or symptoms of PND, no SOB or observations of edema.  After the ER visit she saw her PMD who told her to go ahead and take the clonidine TID every day (not if needed), this seemed to bring her BP down some but has crept back up.  She also saw a urologist who did an [place], states she was told he did not think the blood thinner caused the bleeding but did not tell what he thought did.  She does have history of renal stones.   10/18/16 CBC OK, Creat 1.49 (cl 33) doxyclycline could have done it  Xarelto should be 15mg  (for 15-50), Sept clearance was 41, if remains <50 would change xarelto dose, Feb was 16  AFib hx: DCCV  07/25/16 Amiodarone started Aug 2017  Past Medical History:  Diagnosis Date  . Aortic valve sclerosis   . Bradycardia   . Cancer ([place])    basal cell carcinoma near eyes  . Chronic cough   . Dysrhythmia    A Fib  . Edema of lower extremity   . GERD (gastroesophageal reflux disease)   . H/O one miscarriage   . Hard of hearing    has hearing aides but does not wear   . Heart murmur   . Hip pain    left  . History of bladder infections    08/2015  . History of blood transfusion   . History of hiatal hernia   . History of kidney stones   . Hyperlipidemia   . Hypertension   . Leg pain, left   . OA (osteoarthritis)   . Obesity   . Renal insufficiency   . Tinnitus   . Urinary incontinence   . Varicose veins   . Wears glasses     Past Surgical History:  Procedure Laterality Date  . BREAST SURGERY     cyst removed per right breast 1977  . CARDIOVASCULAR STRESS TEST  06/25/2006   EF 62%  . CARDIOVERSION N/A 07/25/2016   Procedure: CARDIOVERSION;  Surgeon: Lelon Perla, MD;  Location: [facility] ENDOSCOPY;  Service: Cardiovascular;  Laterality: N/A;  . CYST REMOVAL  FROM OVARY  2002  . DILATION AND CURETTAGE OF UTERUS    . EYE SURGERY     cataract surgery bilat   . NASAL SEPTUM SURGERY    . TOTAL HIP ARTHROPLASTY Left 12/18/2015   Procedure: LEFT TOTAL HIP ARTHROPLASTY ANTERIOR APPROACH;  Surgeon: Paralee Cancel, MD;  Location: [facility] ORS;  Service: Orthopedics;  Laterality: Left;    Current Outpatient Prescriptions  Medication Sig Dispense Refill  . ALPRAZolam (XANAX) 0.5 MG tablet Take 0.25-0.5 mg by mouth 3 (three) times daily as needed for anxiety.     Marland Kitchen amiodarone (PACERONE) 200 MG tablet Take 2 tablets (400 mg total) by mouth 2 (two) times daily. For one week.  Then take 1 tablet (200 mg total) 2 times daily for one week. 42 tablet 0  . amiodarone (PACERONE) 200 MG tablet Take 1 tablet (200 mg total) by mouth daily. 30 tablet 6  . BIOTIN PO Take 1 tablet by mouth daily.    .  busPIRone (BUSPAR) 5 MG tablet Take 5 mg by mouth 2 (two) times daily.    . cloNIDine (CATAPRES) 0.1 MG tablet Take 1 tablet (0.1 mg total) by mouth 2 (two) times daily. 60 tablet 4  . Cyanocobalamin (VITAMIN B-12 PO) Take 1 tablet by mouth daily with breakfast.    . DIOVAN HCT 320-25 MG per tablet Take 1 tablet by mouth Daily.    Marland Kitchen doxycycline (VIBRA-TABS) 100 MG tablet Take 100 mg by mouth 2 (two) times daily. For 10 days starting on 10/14/16  0  . Multiple Vitamin (MULTIVITAMIN) tablet Take 1 tablet by mouth daily.      . polyethylene glycol (MIRALAX / GLYCOLAX) packet Take 17 g by mouth 2 (two) times daily. (Patient taking differently: Take 17 g by mouth every morning. ) 14 each 0  . polyvinyl alcohol (LIQUIFILM TEARS) 1.4 % ophthalmic solution Place 1 drop into both eyes 3 (three) times daily as needed for dry eyes.    . rivaroxaban (XARELTO) 20 MG TABS tablet Take 20 mg by mouth daily.    Marland Kitchen zolpidem (AMBIEN) 5 MG tablet Take 5 mg by mouth daily as needed for sleep.    . hydrALAZINE (APRESOLINE) 10 MG tablet Take 1 tablet (10 mg total) by mouth 3 (three) times daily. 90 tablet 4   No current facility-administered medications for this visit.     Allergies:   Ace inhibitors; Celebrex [celecoxib]; Codeine; Norvasc [amlodipine besylate]; and Vioxx [rofecoxib]   Social History:  The patient  reports that she has never smoked. She has never used smokeless tobacco. She reports that she does not drink alcohol or use drugs.   Family History:  The patient's family history includes Diabetes in her mother; Heart disease in her father; Hypertension in her brother and sister.  ROS:  Please see the history of present illness.    All other systems are reviewed and otherwise negative.   PHYSICAL EXAM:  VS:  BP (!) 196/80   Pulse (!) 51   Ht 5\' 4"  (1.626 m)   Wt 162 lb (73.5 kg)   BMI 27.81 kg/m  BMI: Body mass index is 27.81 kg/m. Well nourished, well developed, in no acute distress  HEENT:  normocephalic, atraumatic  Neck: no JVD, carotid bruits or masses Cardiac:  RRR, bradycardic; 1/6 SM, no rubs, or gallops Lungs:  clear to auscultation bilaterally, no wheezing, rhonchi or rales  Abd: soft, nontender MS: no deformity or atrophy Ext: no edema  Skin: warm and dry,  no rash Neuro:  No gross deficits appreciated Psych: euthymic mood, full affect    EKG:  Done today and reviewed by myself is SB, PAC's, 51bpm, PR 128ms, QRS 127ms, QTc 473ms  11/29/15: TTE LVEF 55-65%, mild conc LVH Moderately severe MR/TR RVSP 35-67OLID Diastolic dysfunction LA 0.3UD  Recent Labs: 10/18/2016: BUN 25; Creatinine, Ser 1.49; Hemoglobin 14.3; Platelets 199; Potassium 4.0; Sodium 137  No results found for requested labs within last 8760 hours.   CrCl cannot be calculated (Patient's most recent lab result is older than the maximum 21 days allowed.).   Wt Readings from Last 3 Encounters:  11/19/16 162 lb (73.5 kg)  10/17/16 160 lb (72.6 kg)  07/25/16 163 lb (73.9 kg)     Other studies reviewed: Additional studies/records reviewed today include: summarized above  ASSESSMENT AND PLAN:  1. Persistent AFib     DCCV September     SB today     CHA2DS2Vasc is 4, on Xarelto  Unclear what is driving her fatigue/symptoms, ?bradycardia, seems fatigue was a primary complaint prior to her DCCV as well.  2. HTN     Uncontrolled  3. Last year's echo with VHD, mild LVH and good EF     Exam is negative for any evidence of fluid OL     No clear symptomatology for worsening VHD     Will recheck her echo to f/u  Unable to use BB given her bradycardia, she had significant edema with norvasc apparently, will reduce her clonidine back to 0.1mg  BID given it has not helped her BP with the TID dosing and she is bradycardic, will start hydralazine 10mg  TID.  Will check BMET given her last couple have her Creatt Clearance <50, we may need to adjust her xarelto dose, will check CBC, LFTs and TSH given her  amiodarone as well.  Will have her wear a heart/event monitor to evaluate her palpitations and bradycardia further.  Disposition: 4-6 weeks sooner if needed  Current medicines are reviewed at length with the patient today.  The patient did not have any concerns regarding medicines.  Haywood Lasso, PA-C 11/19/2016 4:32 PM     [address] [place] [address] [phone] (office)  [phone] (fax)

## 2016-11-20 LAB — CBC
Hematocrit: 41.3 % (ref 34.0–46.6)
Hemoglobin: 14 g/dL (ref 11.1–15.9)
MCH: 31 pg (ref 26.6–33.0)
MCHC: 33.9 g/dL (ref 31.5–35.7)
MCV: 91 fL (ref 79–97)
PLATELETS: 246 10*3/uL (ref 150–379)
RBC: 4.52 x10E6/uL (ref 3.77–5.28)
RDW: 13.6 % (ref 12.3–15.4)
WBC: 6.7 10*3/uL (ref 3.4–10.8)

## 2016-11-20 LAB — HEPATIC FUNCTION PANEL
ALK PHOS: 91 IU/L (ref 39–117)
ALT: 23 IU/L (ref 0–32)
AST: 34 IU/L (ref 0–40)
Albumin: 4.6 g/dL (ref 3.5–4.7)
Bilirubin Total: 0.7 mg/dL (ref 0.0–1.2)
Bilirubin, Direct: 0.25 mg/dL (ref 0.00–0.40)
TOTAL PROTEIN: 7.6 g/dL (ref 6.0–8.5)

## 2016-11-20 LAB — BASIC METABOLIC PANEL
BUN/Creatinine Ratio: 18 (ref 12–28)
BUN: 24 mg/dL (ref 8–27)
CALCIUM: 10 mg/dL (ref 8.7–10.3)
CHLORIDE: 98 mmol/L (ref 96–106)
CO2: 23 mmol/L (ref 18–29)
Creatinine, Ser: 1.3 mg/dL — ABNORMAL HIGH (ref 0.57–1.00)
GFR calc Af Amer: 44 mL/min/{1.73_m2} — ABNORMAL LOW (ref >59)
GFR calc non Af Amer: 38 mL/min/{1.73_m2} — ABNORMAL LOW (ref >59)
GLUCOSE: 101 mg/dL — AB (ref 65–99)
POTASSIUM: 4.3 mmol/L (ref 3.5–5.2)
SODIUM: 142 mmol/L (ref 134–144)

## 2016-11-20 LAB — TSH: TSH: 1.14 u[IU]/mL (ref 0.450–4.500)

## 2016-11-20 NOTE — Addendum Note (Signed)
Addended by: Claude Manges on: 11/20/2016 12:53 PM   Modules accepted: Orders

## 2016-11-21 ENCOUNTER — Other Ambulatory Visit: Payer: Self-pay | Admitting: *Deleted

## 2016-11-21 ENCOUNTER — Telehealth: Payer: Self-pay | Admitting: *Deleted

## 2016-11-21 MED ORDER — RIVAROXABAN 15 MG PO TABS: 15.0000 mg | ORAL_TABLET | Freq: Every day | ORAL | 5 refills | Status: DC

## 2016-11-21 NOTE — Telephone Encounter (Signed)
-----   Message from [facility], [place] sent at 11/20/2016  4:49 PM EST ----- Please let the patient know her blood counts are good, liver and thyroid also look good.  Kidney function is stable for the last few months, but a little declined from a year ago.  Given that, I would like to decrease her Xarelto dose to 15mg  daily.    Remind her to keep an eye on her BP with the medicine changes we made yesterday  Thanks Joseph Art

## 2016-11-21 NOTE — Telephone Encounter (Signed)
SPOKE TO DAUGHTER (DPR) DONNA SIMPSON  ABOUT LAB RESULTS  AND MEDICATION CHANGES REVERIFIED PHARMACY  ALSO.Marland KitchenMarland Kitchen

## 2016-11-24 ENCOUNTER — Ambulatory Visit (HOSPITAL_COMMUNITY)
Admission: RE | Admit: 2016-11-24 | Discharge: 2016-11-24 | Disposition: A | Discharge status: Home / Self Care | Payer: Medicare Other | Source: Ambulatory Visit | Attending: Physician Assistant | Admitting: Physician Assistant

## 2016-11-24 DIAGNOSIS — I351 Nonrheumatic aortic (valve) insufficiency: Secondary | ICD-10-CM | POA: Insufficient documentation

## 2016-11-24 DIAGNOSIS — R5383 Other fatigue: Secondary | ICD-10-CM | POA: Insufficient documentation

## 2016-11-24 DIAGNOSIS — I4819 Other persistent atrial fibrillation: Secondary | ICD-10-CM

## 2016-11-24 DIAGNOSIS — I481 Persistent atrial fibrillation: Secondary | ICD-10-CM | POA: Diagnosis present

## 2016-11-24 NOTE — Progress Notes (Signed)
  Echocardiogram 2D Echocardiogram has been performed.  [name] 11/24/2016, 11:09 AM

## 2016-11-25 ENCOUNTER — Telehealth: Payer: Self-pay

## 2016-11-25 NOTE — Telephone Encounter (Signed)
[facility] faxed over an episode report of the patient that said that the patient had a 3 second pause/bradycardia HR 30 bpm at 4:52 AM EST. I called the patient and the patient states that she was sleeping and that she did not have any symptoms. The patient ststes she is taking her medications as directed. DOD Dr. Lovena Le reviewed report and stated that nocturnal pauses are normal and no changes required. Patient made aware and verbalized understanding.

## 2016-12-04 ENCOUNTER — Other Ambulatory Visit (HOSPITAL_COMMUNITY): Payer: Medicare Other

## 2016-12-06 ENCOUNTER — Telehealth: Payer: Self-pay | Admitting: Physician Assistant

## 2016-12-06 NOTE — Telephone Encounter (Signed)
Contacted by [employer] [phone] regarding slow HR in the 30s, however patient is asymptomatic. I have advised her to cut the amiodarone to half. I have advised her to seek immediately medical attention if she does have any severe dizziness or feeling of passing out.   Hilbert Corrigan PA Pager: [phone]

## 2016-12-08 ENCOUNTER — Telehealth: Payer: Self-pay

## 2016-12-08 NOTE — Telephone Encounter (Signed)
Received [facility] episode report from 12/06/16 at 4:33 AM. Preliminary findings: SB w/ PACs - HR:30. Called, spoke with pt. Pt stated Almyra Deforest, PA contacted pt around 2:30 PM on 12/06/16, and instructed her to cut her dose in half of the Amiodarone (decrease to 100 mg daily). Pt started decreased dose on 12/07/16. Pt stated she was asleep when event occurred, although she had been awake prior to event (pt went to the bathroom). Pt denied CP or SOB.   Pt stated she is concerned about her elevated BP and her weakness. Pt stated she is under a lot of stress. 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 Pt stated her BP remains elevated (180/65 BP last night). Pt will obtain BP for today and call me back with that value. Instructed pt to continue with current medications and I will forward information to Dr. Curt Bears and his nurse. Pt verbalized understanding.

## 2016-12-09 MED ORDER — HYDRALAZINE HCL 25 MG PO TABS: 25.0000 mg | ORAL_TABLET | Freq: Three times a day (TID) | ORAL | 3 refills | Status: DC

## 2016-12-09 NOTE — Telephone Encounter (Signed)
F/u Message  Pt call requesting to speak with RN to f/u. Please call back to discuss

## 2016-12-09 NOTE — Telephone Encounter (Signed)
Informed dtr (DPR on file, but pt in background talking) of [facility] recommendations. Hydralazine rx sent to [facility] per dtr request. I will call her this week to arrange TEE. Patient's dtr verbalized understanding and agreeable to plan.

## 2016-12-09 NOTE — Telephone Encounter (Signed)
F/u message ° °Pt returning RN call. Please call back to discuss  °

## 2016-12-09 NOTE — Telephone Encounter (Signed)
Lets start her on Cozaar 50 mg and have her come in for a BMP 7 days later.  If BP still elevated, should call back for further adjustment.

## 2016-12-09 NOTE — Telephone Encounter (Signed)
Correction, per Dr. Curt Bears:  Do not start Cozaar Increase Hydralazine to 25 mg Order TEE r/o severe MR Increase Amiodarone back to 200 mg daily

## 2016-12-09 NOTE — Telephone Encounter (Signed)
Follow Up:    Pt's daughter returning call.

## 2016-12-12 ENCOUNTER — Telehealth: Payer: Self-pay | Admitting: Cardiology

## 2016-12-12 ENCOUNTER — Encounter: Payer: Self-pay | Admitting: *Deleted

## 2016-12-12 ENCOUNTER — Other Ambulatory Visit: Payer: Medicare Other | Admitting: *Deleted

## 2016-12-12 ENCOUNTER — Other Ambulatory Visit: Payer: Self-pay | Admitting: Cardiology

## 2016-12-12 DIAGNOSIS — I340 Nonrheumatic mitral (valve) insufficiency: Secondary | ICD-10-CM

## 2016-12-12 DIAGNOSIS — Z01812 Encounter for preprocedural laboratory examination: Secondary | ICD-10-CM

## 2016-12-12 MED ORDER — HYDRALAZINE HCL 50 MG PO TABS: 50.0000 mg | ORAL_TABLET | Freq: Three times a day (TID) | ORAL | 3 refills | Status: DC

## 2016-12-12 NOTE — Telephone Encounter (Signed)
New message     Please call did you remember to set up the TEE for her mother?  Just touching base with you

## 2016-12-12 NOTE — Telephone Encounter (Signed)
Reports SBP still elevated:  145, 160, 148, 170, 169, 175, 149. Advised to increase hydralazine to 50 mg TID, per Dr. Curt Bears. She will call over weekend if issues remain.  Otherwise, I will follow up next week to see how BP are maintaining after med increase. Dtr thanks me and is agreeable to plan.  Scheduled TEE for 2/6. Pre procedure labs today. Letter of instructions reviewed w/ dtr and left at front desk for them to pick up when they come back for labs.

## 2016-12-13 LAB — BASIC METABOLIC PANEL
BUN / CREAT RATIO: 21 (ref 12–28)
BUN: 31 mg/dL — AB (ref 8–27)
CHLORIDE: 99 mmol/L (ref 96–106)
CO2: 26 mmol/L (ref 18–29)
CREATININE: 1.45 mg/dL — AB (ref 0.57–1.00)
Calcium: 9.7 mg/dL (ref 8.7–10.3)
GFR calc non Af Amer: 33 mL/min/{1.73_m2} — ABNORMAL LOW (ref >59)
GFR, EST AFRICAN AMERICAN: 38 mL/min/{1.73_m2} — AB (ref >59)
Glucose: 104 mg/dL — ABNORMAL HIGH (ref 65–99)
Potassium: 3.9 mmol/L (ref 3.5–5.2)
Sodium: 141 mmol/L (ref 134–144)

## 2016-12-13 LAB — CBC WITH DIFFERENTIAL/PLATELET
Basophils Absolute: 0 10*3/uL (ref 0.0–0.2)
Basos: 1 %
EOS (ABSOLUTE): 0.2 10*3/uL (ref 0.0–0.4)
EOS: 3 %
HEMOGLOBIN: 13.6 g/dL (ref 11.1–15.9)
Hematocrit: 42.3 % (ref 34.0–46.6)
Immature Grans (Abs): 0 10*3/uL (ref 0.0–0.1)
Immature Granulocytes: 0 %
LYMPHS ABS: 1.7 10*3/uL (ref 0.7–3.1)
Lymphs: 27 %
MCH: 31 pg (ref 26.6–33.0)
MCHC: 32.2 g/dL (ref 31.5–35.7)
MCV: 96 fL (ref 79–97)
MONOCYTES: 9 %
Monocytes Absolute: 0.6 10*3/uL (ref 0.1–0.9)
NEUTROS ABS: 3.9 10*3/uL (ref 1.4–7.0)
Neutrophils: 60 %
Platelets: 246 10*3/uL (ref 150–379)
RBC: 4.39 x10E6/uL (ref 3.77–5.28)
RDW: 13.8 % (ref 12.3–15.4)
WBC: 6.4 10*3/uL (ref 3.4–10.8)

## 2016-12-16 ENCOUNTER — Encounter (HOSPITAL_COMMUNITY): Payer: Self-pay

## 2016-12-16 ENCOUNTER — Ambulatory Visit (HOSPITAL_BASED_OUTPATIENT_CLINIC_OR_DEPARTMENT_OTHER)
Admission: RE | Admit: 2016-12-16 | Discharge: 2016-12-16 | Disposition: A | Discharge status: Home / Self Care | Payer: Medicare Other | Source: Ambulatory Visit | Attending: Cardiology | Admitting: Cardiology

## 2016-12-16 ENCOUNTER — Ambulatory Visit (HOSPITAL_COMMUNITY)
Admission: RE | Admit: 2016-12-16 | Discharge: 2016-12-16 | Disposition: A | Discharge status: Home / Self Care | Payer: Medicare Other | Source: Ambulatory Visit | Attending: Cardiology | Admitting: Cardiology

## 2016-12-16 ENCOUNTER — Encounter (HOSPITAL_COMMUNITY)
Admission: RE | Disposition: A | Discharge status: Home / Self Care | Payer: Self-pay | Source: Ambulatory Visit | Attending: Cardiology

## 2016-12-16 DIAGNOSIS — Z85828 Personal history of other malignant neoplasm of skin: Secondary | ICD-10-CM | POA: Insufficient documentation

## 2016-12-16 DIAGNOSIS — Z96642 Presence of left artificial hip joint: Secondary | ICD-10-CM | POA: Diagnosis not present

## 2016-12-16 DIAGNOSIS — Z7901 Long term (current) use of anticoagulants: Secondary | ICD-10-CM | POA: Diagnosis not present

## 2016-12-16 DIAGNOSIS — E669 Obesity, unspecified: Secondary | ICD-10-CM | POA: Diagnosis not present

## 2016-12-16 DIAGNOSIS — Z6827 Body mass index (BMI) 27.0-27.9, adult: Secondary | ICD-10-CM | POA: Diagnosis not present

## 2016-12-16 DIAGNOSIS — I272 Pulmonary hypertension, unspecified: Secondary | ICD-10-CM | POA: Diagnosis not present

## 2016-12-16 DIAGNOSIS — I481 Persistent atrial fibrillation: Secondary | ICD-10-CM | POA: Diagnosis not present

## 2016-12-16 DIAGNOSIS — I10 Essential (primary) hypertension: Secondary | ICD-10-CM | POA: Diagnosis not present

## 2016-12-16 DIAGNOSIS — Z79899 Other long term (current) drug therapy: Secondary | ICD-10-CM | POA: Diagnosis not present

## 2016-12-16 DIAGNOSIS — I340 Nonrheumatic mitral (valve) insufficiency: Secondary | ICD-10-CM | POA: Diagnosis not present

## 2016-12-16 DIAGNOSIS — R001 Bradycardia, unspecified: Secondary | ICD-10-CM | POA: Insufficient documentation

## 2016-12-16 HISTORY — PX: TEE WITHOUT CARDIOVERSION: SHX5443

## 2016-12-16 SURGERY — ECHOCARDIOGRAM, TRANSESOPHAGEAL
Anesthesia: Moderate Sedation

## 2016-12-16 MED ORDER — FENTANYL CITRATE (PF) 100 MCG/2ML IJ SOLN
INTRAMUSCULAR | Status: AC
  Filled 2016-12-16: qty 2

## 2016-12-16 MED ORDER — FENTANYL CITRATE (PF) 100 MCG/2ML IJ SOLN
INTRAMUSCULAR | Status: DC | PRN
  Administered 2016-12-16: 25 ug via INTRAVENOUS

## 2016-12-16 MED ORDER — MIDAZOLAM HCL 5 MG/ML IJ SOLN
INTRAMUSCULAR | Status: AC
  Filled 2016-12-16: qty 2

## 2016-12-16 MED ORDER — BUTAMBEN-TETRACAINE-BENZOCAINE 2-2-14 % EX AERO
INHALATION_SPRAY | CUTANEOUS | Status: DC | PRN
  Administered 2016-12-16: 2 via TOPICAL

## 2016-12-16 MED ORDER — SODIUM CHLORIDE 0.9 % IV SOLN: INTRAVENOUS | Status: DC

## 2016-12-16 MED ORDER — MIDAZOLAM HCL 10 MG/2ML IJ SOLN
INTRAMUSCULAR | Status: DC | PRN
  Administered 2016-12-16: 2 mg via INTRAVENOUS

## 2016-12-16 NOTE — Discharge Instructions (Signed)

## 2016-12-16 NOTE — H&P (View-Only) (Signed)
Cardiology Office Note Date:  11/19/2016  Patient ID:  [name], DOB [birth year], MRN [id number] PCP:  Chesley Noon, MD  Electrophysiologist: Dr. Curt Bears    Chief Complaint:  Persistent HTN  History of Present Illness: [name] is a 83 y.o. female with history of chronic AFib, HTN comes in today to be seen for Dr. Curt Bears, last seen by him in August, noting a dx of chronic AFib though with exertional intolerances thought to be secondary to her AF started on amiodarone and planned for DCCV which was done 07/25/16.  10/14/16 treated for pansinusitis [facility] 10/17/16 ER with weakness, tremulous HTN 200/100, missed a clonidine dose?? single episode hematuria  She comes today accompanied by her daughter and son-in-law.  She states she feels like she is anxious/tremulous at times and this seems to correlate with high BP, she has gotten numbers of SBP 160's-170's, and then over the weekend last weekend 200 again.  These numbers make her feel jittery/anxious.  She denies any CP, she has had some sensation of some palpitations early AM hours, though nothing like she had when in AF.  She does notice when checking her BP her HR can be in the high 40's low 50's.  No near syncope or syncope, but has no energy.  She tells me she doesn't feel well, she does not describe DOE or symptoms of PND, no SOB or observations of edema.  After the ER visit she saw her PMD who told her to go ahead and take the clonidine TID every day (not if needed), this seemed to bring her BP down some but has crept back up.  She also saw a urologist who did an [place], states she was told he did not think the blood thinner caused the bleeding but did not tell what he thought did.  She does have history of renal stones.   10/18/16 CBC OK, Creat 1.49 (cl 33) doxyclycline could have done it  Xarelto should be 15mg  (for 15-50), Sept clearance was 41, if remains <50 would change xarelto dose, Feb was 66  AFib hx: DCCV  07/25/16 Amiodarone started Aug 2017  Past Medical History:  Diagnosis Date  . Aortic valve sclerosis   . Bradycardia   . 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 Chronic cough   . Dysrhythmia    A Fib  . Edema of lower extremity   . GERD (gastroesophageal reflux disease)   . H/O one miscarriage   . Hard of hearing    has hearing aides but does not wear   . Heart murmur   . Hip pain    left  . History of bladder infections    08/2015  . History of blood transfusion   . History of hiatal hernia   . History of kidney stones   . Hyperlipidemia   . Hypertension   . Leg pain, left   . OA (osteoarthritis)   . Obesity   . Renal insufficiency   . Tinnitus   . Urinary incontinence   . Varicose veins   . Wears glasses     Past Surgical History:  Procedure Laterality Date  . BREAST SURGERY     cyst removed per right breast 1977  . CARDIOVASCULAR STRESS TEST  06/25/2006   EF 62%  . CARDIOVERSION N/A 07/25/2016   Procedure: CARDIOVERSION;  Surgeon: Lelon Perla, MD;  Location: [facility] ENDOSCOPY;  Service: Cardiovascular;  Laterality: N/A;  . CYST REMOVAL  FROM OVARY  2002  . DILATION AND CURETTAGE OF UTERUS    . EYE SURGERY     cataract surgery bilat   . NASAL SEPTUM SURGERY    . TOTAL HIP ARTHROPLASTY Left 12/18/2015   Procedure: LEFT TOTAL HIP ARTHROPLASTY ANTERIOR APPROACH;  Surgeon: Paralee Cancel, MD;  Location: [facility] ORS;  Service: Orthopedics;  Laterality: Left;    Current Outpatient Prescriptions  Medication Sig Dispense Refill  . ALPRAZolam (XANAX) 0.5 MG tablet Take 0.25-0.5 mg by mouth 3 (three) times daily as needed for anxiety.     Marland Kitchen amiodarone (PACERONE) 200 MG tablet Take 2 tablets (400 mg total) by mouth 2 (two) times daily. For one week.  Then take 1 tablet (200 mg total) 2 times daily for one week. 42 tablet 0  . amiodarone (PACERONE) 200 MG tablet Take 1 tablet (200 mg total) by mouth daily. 30 tablet 6  . BIOTIN PO Take 1 tablet by mouth daily.    .  busPIRone (BUSPAR) 5 MG tablet Take 5 mg by mouth 2 (two) times daily.    . cloNIDine (CATAPRES) 0.1 MG tablet Take 1 tablet (0.1 mg total) by mouth 2 (two) times daily. 60 tablet 4  . Cyanocobalamin (VITAMIN B-12 PO) Take 1 tablet by mouth daily with breakfast.    . DIOVAN HCT 320-25 MG per tablet Take 1 tablet by mouth Daily.    Marland Kitchen doxycycline (VIBRA-TABS) 100 MG tablet Take 100 mg by mouth 2 (two) times daily. For 10 days starting on 10/14/16  0  . Multiple Vitamin (MULTIVITAMIN) tablet Take 1 tablet by mouth daily.      . polyethylene glycol (MIRALAX / GLYCOLAX) packet Take 17 g by mouth 2 (two) times daily. (Patient taking differently: Take 17 g by mouth every morning. ) 14 each 0  . polyvinyl alcohol (LIQUIFILM TEARS) 1.4 % ophthalmic solution Place 1 drop into both eyes 3 (three) times daily as needed for dry eyes.    . rivaroxaban (XARELTO) 20 MG TABS tablet Take 20 mg by mouth daily.    Marland Kitchen zolpidem (AMBIEN) 5 MG tablet Take 5 mg by mouth daily as needed for sleep.    . hydrALAZINE (APRESOLINE) 10 MG tablet Take 1 tablet (10 mg total) by mouth 3 (three) times daily. 90 tablet 4   No current facility-administered medications for this visit.     Allergies:   Ace inhibitors; Celebrex [celecoxib]; Codeine; Norvasc [amlodipine besylate]; and Vioxx [rofecoxib]   Social History:  The patient  reports that she has never smoked. She has never used smokeless tobacco. She reports that she does not drink alcohol or use drugs.   Family History:  The patient's family history includes Diabetes in her mother; Heart disease in her father; Hypertension in her brother and sister.  ROS:  Please see the history of present illness.    All other systems are reviewed and otherwise negative.   PHYSICAL EXAM:  VS:  BP (!) 196/80   Pulse (!) 51   Ht 5\' 4"  (1.626 m)   Wt 162 lb (73.5 kg)   BMI 27.81 kg/m  BMI: Body mass index is 27.81 kg/m. Well nourished, well developed, in no acute distress  HEENT:  normocephalic, atraumatic  Neck: no JVD, carotid bruits or masses Cardiac:  RRR, bradycardic; 1/6 SM, no rubs, or gallops Lungs:  clear to auscultation bilaterally, no wheezing, rhonchi or rales  Abd: soft, nontender MS: no deformity or atrophy Ext: no edema  Skin: warm and dry,  no rash Neuro:  No gross deficits appreciated Psych: euthymic mood, full affect    EKG:  Done today and reviewed by myself is SB, PAC's, 51bpm, PR 175ms, QRS 112ms, QTc 469ms  11/29/15: TTE LVEF 55-65%, mild conc LVH Moderately severe MR/TR RVSP 63-84TXMI Diastolic dysfunction LA 6.8EH  Recent Labs: 10/18/2016: BUN 25; Creatinine, Ser 1.49; Hemoglobin 14.3; Platelets 199; Potassium 4.0; Sodium 137  No results found for requested labs within last 8760 hours.   CrCl cannot be calculated (Patient's most recent lab result is older than the maximum 21 days allowed.).   Wt Readings from Last 3 Encounters:  11/19/16 162 lb (73.5 kg)  10/17/16 160 lb (72.6 kg)  07/25/16 163 lb (73.9 kg)     Other studies reviewed: Additional studies/records reviewed today include: summarized above  ASSESSMENT AND PLAN:  1. Persistent AFib     DCCV September     SB today     CHA2DS2Vasc is 4, on Xarelto  Unclear what is driving her fatigue/symptoms, ?bradycardia, seems fatigue was a primary complaint prior to her DCCV as well.  2. HTN     Uncontrolled  3. Last year's echo with VHD, mild LVH and good EF     Exam is negative for any evidence of fluid OL     No clear symptomatology for worsening VHD     Will recheck her echo to f/u  Unable to use BB given her bradycardia, she had significant edema with norvasc apparently, will reduce her clonidine back to 0.1mg  BID given it has not helped her BP with the TID dosing and she is bradycardic, will start hydralazine 10mg  TID.  Will check BMET given her last couple have her Creatt Clearance <50, we may need to adjust her xarelto dose, will check CBC, LFTs and TSH given her  amiodarone as well.  Will have her wear a heart/event monitor to evaluate her palpitations and bradycardia further.  Disposition: 4-6 weeks sooner if needed  Current medicines are reviewed at length with the patient today.  The patient did not have any concerns regarding medicines.  Haywood Lasso, PA-C 11/19/2016 4:32 PM     [address] [place] [address] [phone] (office)  [phone] (fax)

## 2016-12-16 NOTE — Progress Notes (Signed)
  Echocardiogram Echocardiogram Transesophageal has been performed.  [name] M 12/16/2016, 12:39 PM

## 2016-12-16 NOTE — Interval H&P Note (Signed)
History and Physical Interval Note:  12/16/2016 11:15 AM  [name]  has presented today for surgery, with the diagnosis of SEVERE MITRAL REGURGITATION  The various methods of treatment have been discussed with the patient and family. After consideration of risks, benefits and other options for treatment, the patient has consented to  Procedure(s): TRANSESOPHAGEAL ECHOCARDIOGRAM (TEE) (N/A) as a surgical intervention .  The patient's history has been reviewed, patient examined, no change in status, stable for surgery.  I have reviewed the patient's chart and labs.  Questions were answered to the patient's satisfaction.     Erlin Gardella [employer]

## 2016-12-16 NOTE — CV Procedure (Signed)
Procedure: TEE  Indication: Mitral regurgitation.  Sedation: Versed 2 mg IV, Fentanyl 25 mcg IV.   Findings: Please see echo section for full report.  Normal LV size with mild LV hypertrophy.  EF 60-65%.  Normal wall motion.  Normal RV size and systolic function.  Moderate left atrial enlargement with no LA appendage thrombus.  Mild right atrial enlargement.  Mild to moderate TR, peak RV-RA gradient 54 mmHg.  Trileaflet aortic valve with no stenosis or regurgitation.  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  The posterior leaflet was somewhat fixed.  There was moderate eccentric mitral regurgitation.  ERO 0.15 cm^2 by PISA (probably somewhat under-estimated).  There was no systolic flow reversal in the pulmonary vein doppler pattern.  No PFO/ASD by color doppler.  Normal caliber thoracic aorta with mild plaque.    Impression: The mitral regurgitation appears moderate.  There is moderate pulmonary hypertension.   Loralie Champagne 12/16/2016 11:34 AM

## 2016-12-18 DIAGNOSIS — R001 Bradycardia, unspecified: Secondary | ICD-10-CM | POA: Diagnosis not present

## 2016-12-18 DIAGNOSIS — I10 Essential (primary) hypertension: Secondary | ICD-10-CM | POA: Diagnosis not present

## 2016-12-22 ENCOUNTER — Emergency Department (HOSPITAL_COMMUNITY)
Admission: EM | Admit: 2016-12-22 | Discharge: 2016-12-23 | Disposition: A | Discharge status: Home / Self Care | Payer: Medicare Other | Attending: Emergency Medicine | Admitting: Emergency Medicine

## 2016-12-22 ENCOUNTER — Encounter (HOSPITAL_COMMUNITY): Payer: Self-pay | Admitting: Emergency Medicine

## 2016-12-22 ENCOUNTER — Ambulatory Visit (INDEPENDENT_AMBULATORY_CARE_PROVIDER_SITE_OTHER): Payer: Medicare Other | Admitting: Cardiology

## 2016-12-22 VITALS — BP 200/84 | HR 56 | Ht 64.0 in | Wt 164.8 lb

## 2016-12-22 DIAGNOSIS — Z85828 Personal history of other malignant neoplasm of skin: Secondary | ICD-10-CM | POA: Diagnosis not present

## 2016-12-22 DIAGNOSIS — I10 Essential (primary) hypertension: Secondary | ICD-10-CM

## 2016-12-22 DIAGNOSIS — Z79899 Other long term (current) drug therapy: Secondary | ICD-10-CM | POA: Insufficient documentation

## 2016-12-22 DIAGNOSIS — Z7901 Long term (current) use of anticoagulants: Secondary | ICD-10-CM | POA: Insufficient documentation

## 2016-12-22 DIAGNOSIS — I481 Persistent atrial fibrillation: Secondary | ICD-10-CM | POA: Diagnosis not present

## 2016-12-22 DIAGNOSIS — Z96642 Presence of left artificial hip joint: Secondary | ICD-10-CM | POA: Insufficient documentation

## 2016-12-22 DIAGNOSIS — I4819 Other persistent atrial fibrillation: Secondary | ICD-10-CM

## 2016-12-22 LAB — I-STAT TROPONIN, ED: Troponin i, poc: 0.02 ng/mL (ref 0.00–0.08)

## 2016-12-22 LAB — CBC
HEMATOCRIT: 35.9 % — AB (ref 36.0–46.0)
HEMOGLOBIN: 12.1 g/dL (ref 12.0–15.0)
MCH: 30.8 pg (ref 26.0–34.0)
MCHC: 33.7 g/dL (ref 30.0–36.0)
MCV: 91.3 fL (ref 78.0–100.0)
Platelets: 175 10*3/uL (ref 150–400)
RBC: 3.93 MIL/uL (ref 3.87–5.11)
RDW: 12.9 % (ref 11.5–15.5)
WBC: 4.9 10*3/uL (ref 4.0–10.5)

## 2016-12-22 LAB — BASIC METABOLIC PANEL
Anion gap: 9 (ref 5–15)
BUN: 22 mg/dL — AB (ref 6–20)
CHLORIDE: 104 mmol/L (ref 101–111)
CO2: 23 mmol/L (ref 22–32)
CREATININE: 1.26 mg/dL — AB (ref 0.44–1.00)
Calcium: 9.4 mg/dL (ref 8.9–10.3)
GFR calc non Af Amer: 38 mL/min — ABNORMAL LOW (ref >60)
GFR, EST AFRICAN AMERICAN: 44 mL/min — AB (ref >60)
GLUCOSE: 106 mg/dL — AB (ref 65–99)
Potassium: 3.6 mmol/L (ref 3.5–5.1)
Sodium: 136 mmol/L (ref 135–145)

## 2016-12-22 MED ORDER — FUROSEMIDE 20 MG PO TABS: 20.0000 mg | ORAL_TABLET | Freq: Every day | ORAL | 3 refills | Status: DC

## 2016-12-22 MED ORDER — DOXAZOSIN MESYLATE 2 MG PO TABS: 2.0000 mg | ORAL_TABLET | Freq: Every day | ORAL | 3 refills | Status: DC

## 2016-12-22 MED ORDER — HYDROCHLOROTHIAZIDE 50 MG PO TABS
50.0000 mg | ORAL_TABLET | Freq: Every day | ORAL | Status: DC
  Administered 2016-12-22: 50 mg via ORAL
  Filled 2016-12-22: qty 1
  Filled 2016-12-22: qty 2

## 2016-12-22 MED ORDER — HYDROCHLOROTHIAZIDE 50 MG PO TABS: 50.0000 mg | ORAL_TABLET | Freq: Every day | ORAL | Status: DC

## 2016-12-22 MED ORDER — CLONIDINE HCL 0.1 MG PO TABS
0.1000 mg | ORAL_TABLET | Freq: Once | ORAL | Status: AC
  Administered 2016-12-22: 0.1 mg via ORAL
  Filled 2016-12-22: qty 1

## 2016-12-22 MED ORDER — DOXAZOSIN MESYLATE 2 MG PO TABS
2.0000 mg | ORAL_TABLET | Freq: Every day | ORAL | Status: DC
  Administered 2016-12-22: 2 mg via ORAL
  Filled 2016-12-22: qty 1

## 2016-12-22 MED ORDER — HYDRALAZINE HCL 50 MG PO TABS: 50.0000 mg | ORAL_TABLET | Freq: Three times a day (TID) | ORAL | 3 refills | Status: DC

## 2016-12-22 MED ORDER — VALSARTAN 320 MG PO TABS: 320.0000 mg | ORAL_TABLET | Freq: Every day | ORAL | 3 refills | Status: DC

## 2016-12-22 NOTE — ED Notes (Signed)
Pt return from CT.

## 2016-12-22 NOTE — ED Triage Notes (Signed)
Daughter stated, she just came from the doctors office with high Blood Pressure. They tried twicking her medication and it keeps creeping back up.

## 2016-12-22 NOTE — ED Notes (Signed)
ED Provider at bedside. 

## 2016-12-22 NOTE — Patient Instructions (Addendum)
Medication Instructions:   Your physician has recommended you make the following change in your medication:  1) STOP Diovan HCT 2) START Valsartan 320 mg once daily 3) START Furosemide 20 mEq once daily 4) START Cardura 2 mg once daily  --- If you need a refill on your cardiac medications before your next appointment, please call your pharmacy. ---  Labwork:  None ordered  Testing/Procedures:  None ordered  Follow-Up:  Your physician recommends that you schedule a follow-up appointment in: end of this week/beginning of next for blood pressure check in our blood pressure clinic.   Your physician recommends that you schedule a follow-up appointment in: 3 months with Dr. Curt Bears.  Thank you for choosing [facility]!!   Trinidad Curet, RN [phone]    Any Other Special Instructions Will Be Listed Below (If Applicable).  Doxazosin tablets What is this medicine? DOXAZOSIN (dox AY zoe sin) is an antihypertensive. It works by relaxing the blood vessels. It is used to treat benign prostatic hyperplasia (BPH) in men and to treat high blood pressure in both men and women. This medicine may be used for other purposes; ask your health care provider or pharmacist if you have questions. COMMON BRAND NAME(S): Cardura What should I tell my health care provider before I take this medicine? They need to know if you have any of the following conditions: -kidney or liver disease -low blood pressure -an unusual or allergic reaction to doxazosin, other medicines, foods, dyes, or preservatives -pregnant or trying to get pregnant -breast-feeding How should I use this medicine? Take this medicine by mouth with a glass of water. Follow the directions on the prescription label. Take your doses at regular intervals. Do not take your medicine more often than directed. Do not stop taking except on the advice of your doctor or health care professional. Talk to your pediatrician regarding the  use of this medicine in children. Special care may be needed. Overdosage: If you think you have taken too much of this medicine contact a poison control center or emergency room at once. NOTE: This medicine is only for you. Do not share this medicine with others. What if I miss a dose? If you miss a dose, take it as soon as you can. If it is almost time for your next dose, take only that dose. Do not take double or extra doses. What may interact with this medicine? -cimetidine -medicines for colds or hay fever -medicines for overactive bladder -sildenafil -tadalafil -vardenafil This list may not describe all possible interactions. Give your health care provider a list of all the medicines, herbs, non-prescription drugs, or dietary supplements you use. Also tell them if you smoke, drink alcohol, or use illegal drugs. Some items may interact with your medicine. What should I watch for while using this medicine? Visit your doctor or health care professional for regular checks on your progress. Check your blood pressure regularly. Ask your doctor or health care professional what your blood pressure should be and when you should contact him or her. Drowsiness and dizziness are more likely to occur after the first dose, after an increase in dose, or during hot weather or exercise. These effects can decrease once your body adjusts to this medicine. Do not drive, use machinery, or do anything that needs mental alertness until you know how this drug affects you. Do not stand or sit up quickly, especially if you are an older patient. This reduces the risk of dizzy or fainting spells. Alcohol  can make you more drowsy and dizzy. Avoid alcoholic drinks. Do not treat yourself for coughs, colds, or pain while you are taking this medicine without asking your doctor or health care professional for advice. Some ingredients may increase your blood pressure. Your mouth may get dry. Chewing sugarless gum or sucking hard  candy, and drinking plenty of water may help. Contact your doctor if the problem does not go away or is severe. If you are going to have eye surgery for cataracts, tell your doctor or health care professional you are taking this medicine. A condition called Intraoperative Floppy Iris Syndrome (IFIS) can happen if you have taken this medicine. For males, contact your doctor or health care professional right away if you have an erection that lasts longer than 4 hours or if it becomes painful. This may be a sign of a serious problem and must be treated right away to prevent permanent damage. What side effects may I notice from receiving this medicine? Side effects that you should report to your doctor or health care professional as soon as possible: -allergic reactions like skin rash, itching or hives, swelling of the face, lips, or tongue -breathing problems -changes in vision -chest pain -fast or irregular heartbeat -feeling faint or lightheaded, falls -males: prolonged or painful erection -numbness in hands or feet -swelling of the legs or ankles -unusually weak or tired Side effects that usually do not require medical attention (report to your doctor or health care professional if they continue or are bothersome): -headache -nausea This list may not describe all possible side effects. Call your doctor for medical advice about side effects. You may report side effects to FDA at 1-800-FDA-1088. Where should I keep my medicine? Keep out of the reach of children. Store at room temperature below 30 degrees C (86 degrees F). Throw away any unused medicine after the expiration date. NOTE: This sheet is a summary. It may not cover all possible information. If you have questions about this medicine, talk to your doctor, pharmacist, or health care provider.  2017 Elsevier/Gold Standard (2012-10-27 14:02:30)

## 2016-12-22 NOTE — Progress Notes (Signed)
Electrophysiology Office Note   Date:  12/22/2016   ID:  [name], DOB 03/29/1930, MRN [id number]  PCP:  Chesley Noon, MD  Primary Electrophysiologist:  Constance Haw, MD    Chief Complaint  Patient presents with  . Follow-up    Persistent Afib     History of Present Illness: [name] is a 83 y.o. female who presents today for electrophysiology evaluation.   She has a history of chronic atrial fibrillation, hypertension. Currently, she is on rivaroxaban for anticoagulation. She continues to feel weak and fatigued. She says that she has some good days and some bad days. Today, she says that she has a headache. This occurs at times when her BP is elevated. Her home BP meds have been adjusted without much change in the readings.   Today, she denies symptoms of chest pain, PND, lower extremity edema, claudication, dizziness, presyncope, syncope, bleeding, or neurologic sequela. The patient is tolerating medications without difficulties and is otherwise without complaint today.    Past Medical History:  Diagnosis Date  . Aortic valve sclerosis   . Bradycardia   . Cancer ([place])    basal cell carcinoma near eyes  . Chronic cough   . Dysrhythmia    A Fib  . Edema of lower extremity   . GERD (gastroesophageal reflux disease)   . H/O one miscarriage   . Hard of hearing    has hearing aides but does not wear   . Heart murmur   . Hip pain    left  . History of bladder infections    08/2015  . History of blood transfusion   . History of hiatal hernia   . History of kidney stones   . Hyperlipidemia   . Hypertension   . Leg pain, left   . OA (osteoarthritis)   . Obesity   . Renal insufficiency   . Tinnitus   . Urinary incontinence   . Varicose veins   . Wears glasses    Past Surgical History:  Procedure Laterality Date  . BREAST SURGERY     cyst removed per right breast 1977  . CARDIOVASCULAR STRESS TEST  06/25/2006   EF 62%  . CARDIOVERSION N/A  07/25/2016   Procedure: CARDIOVERSION;  Surgeon: Lelon Perla, MD;  Location: [facility] ENDOSCOPY;  Service: Cardiovascular;  Laterality: N/A;  . CYST REMOVAL FROM OVARY  2002  . DILATION AND CURETTAGE OF UTERUS    . EYE SURGERY     cataract surgery bilat   . NASAL SEPTUM SURGERY    . TEE WITHOUT CARDIOVERSION N/A 12/16/2016   Procedure: TRANSESOPHAGEAL ECHOCARDIOGRAM (TEE);  Surgeon: Larey Dresser, MD;  Location: [place];  Service: Cardiovascular;  Laterality: N/A;  . TOTAL HIP ARTHROPLASTY Left 12/18/2015   Procedure: LEFT TOTAL HIP ARTHROPLASTY ANTERIOR APPROACH;  Surgeon: Paralee Cancel, MD;  Location: [facility] ORS;  Service: Orthopedics;  Laterality: Left;     Current Outpatient Prescriptions  Medication Sig Dispense Refill  . ALPRAZolam (XANAX) 0.5 MG tablet Take 0.25-0.5 mg by mouth 3 (three) times daily as needed for anxiety.     Marland Kitchen amiodarone (PACERONE) 200 MG tablet Take 1 tablet (200 mg total) by mouth daily. 30 tablet 6  . BIOTIN PO Take 1 tablet by mouth daily.    . busPIRone (BUSPAR) 5 MG tablet Take 5 mg by mouth 2 (two) times daily.    . cloNIDine (CATAPRES) 0.1 MG tablet Take 1 tablet (0.1 mg total) by mouth  2 (two) times daily. 60 tablet 4  . Cyanocobalamin (VITAMIN B-12 PO) Take 1 tablet by mouth daily with breakfast.    . hydrALAZINE (APRESOLINE) 50 MG tablet Take 1 tablet (50 mg total) by mouth 3 (three) times daily. 270 tablet 3  . Multiple Vitamin (MULTIVITAMIN) tablet Take 1 tablet by mouth daily.      . polyethylene glycol (MIRALAX / GLYCOLAX) packet Take 17 g by mouth 2 (two) times daily. (Patient taking differently: Take 17 g by mouth every morning. ) 14 each 0  . Rivaroxaban (XARELTO) 15 MG TABS tablet Take 1 tablet (15 mg total) by mouth daily with supper. 30 tablet 5  . zolpidem (AMBIEN) 5 MG tablet Take 5 mg by mouth daily as needed for sleep.    Marland Kitchen doxazosin (CARDURA) 2 MG tablet Take 1 tablet (2 mg total) by mouth daily. 30 tablet 3  . furosemide (LASIX) 20 MG tablet  Take 1 tablet (20 mg total) by mouth daily. 90 tablet 3  . valsartan (DIOVAN) 320 MG tablet Take 1 tablet (320 mg total) by mouth daily. 90 tablet 3   No current facility-administered medications for this visit.     Allergies:   Ace inhibitors; Celebrex [celecoxib]; Codeine; Norvasc [amlodipine besylate]; and Vioxx [rofecoxib]   Social History:  The patient  reports that she has never smoked. She has never used smokeless tobacco. She reports that she does not drink alcohol or use drugs.   Family History:  The patient's family history includes Diabetes in her mother; Heart disease in her father; Hypertension in her brother and sister.    ROS:  Please see the history of present illness.   Otherwise, review of systems is positive for Chills, fatigue, leg swelling, palpitations, cough, dyspnea on exertion, constipation, depression, anxiety, balance problems, dizziness, bruising,.   All other systems are reviewed and negative.    PHYSICAL EXAM: VS:  BP (!) 200/84   Pulse (!) 56   Ht 5\' 4"  (1.626 m)   Wt 164 lb 12.8 oz (74.8 kg)   BMI 28.29 kg/m  , BMI Body mass index is 28.29 kg/m. GEN: Well nourished, well developed, in no acute distress  HEENT: normal  Neck: no JVD, carotid bruits, or masses Cardiac: RRR; 2/6 systolic murmur at the apex, no rubs, or gallops,no edema  Respiratory:  clear to auscultation bilaterally, normal work of breathing GI: soft, nontender, nondistended, + BS MS: no deformity or atrophy  Skin: warm and dry Neuro:  Strength and sensation are intact Psych: euthymic mood, full affect  EKG:  EKG is ordered today. Personal review of the ekg ordered today shows sinus rhythm, APCs, left axis deviation, nonspecific interventricular conduction delay  Recent  Labs: 10/18/2016: Hemoglobin 14.3 11/19/2016: ALT 23; TSH 1.140 12/12/2016: BUN 31; Creatinine, Ser 1.45; Platelets 246; Potassium 3.9; Sodium 141    Lipid Panel  No results found for: CHOL, TRIG, HDL, CHOLHDL,  VLDL, LDLCALC, LDLDIRECT   Wt Readings from Last 3 Encounters:  12/22/16 164 lb 12.8 oz (74.8 kg)  12/16/16 162 lb (73.5 kg)  11/19/16 162 lb (73.5 kg)      Other studies Reviewed: Additional studies/ records that were reviewed today include: TTE 2013  Review of the above records today demonstrates:  - Left ventricle: The cavity size was normal. Wall thickness was increased in a pattern of mild LVH. Systolic function was normal. The estimated ejection fraction was in the range of 50% to 55%. Wall motion was normal; there were no  regional wall motion abnormalities. Features are consistent with a pseudonormal left ventricular filling pattern, with concomitant abnormal relaxation and increased filling pressure (grade 2 diastolic dysfunction). - Aortic valve: Mild regurgitation. - Mitral valve: Mild regurgitation. - Left atrium: The atrium was mildly dilated. - Right atrium: The atrium was mildly dilated. - Tricuspid valve: Mild-moderate regurgitation. - Pulmonary arteries: PA peak pressure: 14mm Hg (S).   ASSESSMENT AND PLAN:  1.  Persistent atrial fibrillation: Currently takes rivaroxaban. Was started on amiodarone and cardioverted to sinus rhythm. In sinus today with APCs. Continue amiodarone.   This patients CHA2DS2-VASc Score and unadjusted Ischemic Stroke Rate (% per year) is equal to 4.8 % stroke rate/year from a score of 4  Above score calculated as 1 point each if present [CHF, HTN, DM, Vascular=MI/PAD/Aortic Plaque, Age if 65-74, or Female] Above score calculated as 2 points each if present [Age > 75, or Stroke/TIA/TE]   2. Hypertension: Has been quite elevated over the last few weeks. Has significant HTN in clinic today with a headache. Initial plan to stop diovan, maximize valsartan, start lasix and cardura. Due to her headache and elevated BP, 915 systolic on recheck, plan changed to send her to the ER for further evaluation.   3. Mitral valve  prolapse with regurgitation: TEE shows moderate regurgitation. Plan to switch her to lasix from HCTZ to hopefully have a more aggressive effect on LA pressure and thus AF control.  Current medicines are reviewed at length with the patient today.   The patient does not have concerns regarding her medicines.  The following changes were made today:  Stop diovan, start valsartan, lasix, cardura  Labs/ tests ordered today include:  Orders Placed This Encounter  Procedures  . EKG 12-Lead     Disposition:   FU with Tanesha Arambula 3 months  Signed, Brayan Votaw Meredith Leeds, MD  12/22/2016 5:22 PM     [address] [place] [id number] [phone] (office) [phone] (fax)

## 2016-12-22 NOTE — Discharge Instructions (Signed)
Please read and follow all provided instructions.  Your diagnoses today include:  1. Hypertension, unspecified type    Tests performed today include: An EKG of your heart A chest x-ray Cardiac enzymes - a blood test for heart muscle damage Blood counts and electrolytes Vital signs. See below for your results today.   Medications prescribed:   Take any prescribed medications only as directed.  Follow-up instructions: Please follow-up with your primary care provider as soon as you can for further evaluation of your symptoms.   Return instructions:  SEEK IMMEDIATE MEDICAL ATTENTION IF: You have severe chest pain, especially if the pain is crushing or pressure-like and spreads to the arms, back, neck, or jaw, or if you have sweating, nausea (feeling sick to your stomach), or shortness of breath. THIS IS AN EMERGENCY. Don't wait to see if the pain will go away. Get medical help at once. Call 911 or 0 (operator). DO NOT drive yourself to the hospital.  Your chest pain gets worse and does not go away with rest.  You have an attack of chest pain lasting longer than usual, despite rest and treatment with the medications your caregiver has prescribed.  You wake from sleep with chest pain or shortness of breath. You feel dizzy or faint. You have chest pain not typical of your usual pain for which you originally saw your caregiver.  You have any other emergent concerns regarding your health.  Additional Information: Chest pain comes from many different causes. Your caregiver has diagnosed you as having chest pain that is not specific for one problem, but does not require admission.  You are at low risk for an acute heart condition or other serious illness.   Your vital signs today were: BP (!) 175/47    Pulse (!) 58    Temp 97.6 F (36.4 C) (Oral)    Resp 22    Ht 5\' 4"  (1.626 m)    Wt 74.4 kg    SpO2 96%    BMI 28.15 kg/m  If your blood pressure (BP) was elevated above 135/85 this visit,  please have this repeated by your doctor within one month. --------------

## 2016-12-22 NOTE — ED Provider Notes (Signed)
[facility] DEPT Provider Note   CSN: [id number] Arrival date & time: 12/22/16  1734     History   Chief Complaint Chief Complaint  Patient presents with  . Hypertension  . Headache    HPI [name] is a 83 y.o. female.  HPI  83 y.o. female with a hx of Atrial Fibrillation on Xarelto, presents to the Emergency Department today complaining of headache as well as hypertension for Cardiologist office. Headache has since resolved here. Headache is intermittent and occurs with elevated blood pressure. No N/V. No visual changes. Pt did not take evening medications today PTA. Evening medications include: hctz 50mg , clonidine 0.1 mg, cardura 2mg . No CP/SOB/ABD pain. No other symptoms noted.    Past Medical History:  Diagnosis Date  . Aortic valve sclerosis   . Bradycardia   . Cancer ([place])    basal cell carcinoma near eyes  . Chronic cough   . Dysrhythmia    A Fib  . Edema of lower extremity   . GERD (gastroesophageal reflux disease)   . H/O one miscarriage   . Hard of hearing    has hearing aides but does not wear   . Heart murmur   . Hip pain    left  . History of bladder infections    08/2015  . History of blood transfusion   . History of hiatal hernia   . History of kidney stones   . Hyperlipidemia   . Hypertension   . Leg pain, left   . OA (osteoarthritis)   . Obesity   . Renal insufficiency   . Tinnitus   . Urinary incontinence   . Varicose veins   . Wears glasses     Patient Active Problem List   Diagnosis Date Noted  . Overweight (BMI 25.0-29.9) 12/19/2015  . S/P left THA, AA 12/18/2015  . Chronic venous insufficiency 11/16/2015  . Varicose veins of both lower extremities 11/16/2015  . Edema 05/20/2012  . Bradycardia   . Hypertension   . Hyperlipidemia     Past Surgical History:  Procedure Laterality Date  . BREAST SURGERY     cyst removed per right breast 1977  . CARDIOVASCULAR STRESS TEST  06/25/2006   EF 62%  . CARDIOVERSION N/A  07/25/2016   Procedure: CARDIOVERSION;  Surgeon: Lelon Perla, MD;  Location: [facility] ENDOSCOPY;  Service: Cardiovascular;  Laterality: N/A;  . CYST REMOVAL FROM OVARY  2002  . DILATION AND CURETTAGE OF UTERUS    . EYE SURGERY     cataract surgery bilat   . NASAL SEPTUM SURGERY    . TEE WITHOUT CARDIOVERSION N/A 12/16/2016   Procedure: TRANSESOPHAGEAL ECHOCARDIOGRAM (TEE);  Surgeon: Larey Dresser, MD;  Location: [place];  Service: Cardiovascular;  Laterality: N/A;  . TOTAL HIP ARTHROPLASTY Left 12/18/2015   Procedure: LEFT TOTAL HIP ARTHROPLASTY ANTERIOR APPROACH;  Surgeon: Paralee Cancel, MD;  Location: [facility] ORS;  Service: Orthopedics;  Laterality: Left;    OB History    No data available       Home Medications    Prior to Admission medications   Medication Sig Start Date End Date Taking? Authorizing Provider  ALPRAZolam Duanne Moron) 0.5 MG tablet Take 0.5 mg by mouth 3 (three) times daily as needed for anxiety.    Yes Historical Provider, MD  amiodarone (PACERONE) 200 MG tablet Take 1 tablet (200 mg total) by mouth daily. 07/07/16  Yes Will Meredith Leeds, MD  busPIRone (BUSPAR) 5 MG tablet Take 5 mg  by mouth 2 (two) times daily.   Yes Historical Provider, MD  cloNIDine (CATAPRES) 0.1 MG tablet Take 1 tablet (0.1 mg total) by mouth 2 (two) times daily. 11/19/16  Yes Renee Dyane Dustman, PA-C  Cyanocobalamin (VITAMIN B-12 PO) Take 1 tablet by mouth daily with breakfast.   Yes Historical Provider, MD  hydrALAZINE (APRESOLINE) 50 MG tablet Take 1 tablet (50 mg total) by mouth 3 (three) times daily. 12/22/16  Yes Will Meredith Leeds, MD  Multiple Vitamin (MULTIVITAMIN) tablet Take 1 tablet by mouth every other day.    Yes Historical Provider, MD  polyethylene glycol (MIRALAX / GLYCOLAX) packet Take 17 g by mouth 2 (two) times daily. Patient taking differently: Take 17 g by mouth daily as needed for mild constipation.  12/19/15  Yes Danae Orleans, PA-C  Rivaroxaban (XARELTO) 15 MG TABS tablet Take 1  tablet (15 mg total) by mouth daily with supper. Patient taking differently: Take 15 mg by mouth daily with lunch.  11/21/16  Yes Renee Dyane Dustman, PA-C  valsartan-hydrochlorothiazide (DIOVAN-HCT) 320-25 MG tablet Take 1 tablet by mouth every morning. 11/29/16  Yes Historical Provider, MD  zolpidem (AMBIEN) 5 MG tablet Take 5 mg by mouth daily as needed for sleep.   Yes Historical Provider, MD  doxazosin (CARDURA) 2 MG tablet Take 1 tablet (2 mg total) by mouth daily. 12/22/16   Will Meredith Leeds, MD  furosemide (LASIX) 20 MG tablet Take 1 tablet (20 mg total) by mouth daily. 12/22/16 03/22/17  Will Meredith Leeds, MD  valsartan (DIOVAN) 320 MG tablet Take 1 tablet (320 mg total) by mouth daily. 12/22/16   Will Meredith Leeds, MD    Family History Family History  Problem Relation Age of Onset  . Diabetes Mother   . Heart disease Father   . Hypertension Sister   . Hypertension Brother     Social History Social History  Substance Use Topics  . Smoking status: Never Smoker  . Smokeless tobacco: Never Used  . Alcohol use No     Allergies   Ace inhibitors; Celebrex [celecoxib]; Codeine; Norvasc [amlodipine besylate]; and Vioxx [rofecoxib]   Review of Systems Review of Systems ROS reviewed and all are negative for acute change except as noted in the HPI.  Physical Exam Updated Vital Signs BP (!) 207/71   Pulse (!) 57   Temp 97.6 F (36.4 C) (Oral)   Resp 14   Ht 5\' 4"  (1.626 m)   Wt 74.4 kg   SpO2 97%   BMI 28.15 kg/m   Physical Exam  Constitutional: She is oriented to person, place, and time. Vital signs are normal. She appears well-developed and well-nourished.  HENT:  Head: Normocephalic and atraumatic.  Right Ear: Hearing normal.  Left Ear: Hearing normal.  Eyes: Conjunctivae and EOM are normal. Pupils are equal, round, and reactive to light.  Neck: Normal range of motion. Neck supple.  Cardiovascular: Normal rate, regular rhythm, normal heart sounds and intact  distal pulses.   Pulmonary/Chest: Effort normal and breath sounds normal.  Abdominal: Soft. There is no tenderness.  Musculoskeletal: Normal range of motion.  Neurological: She is alert and oriented to person, place, and time.  Skin: Skin is warm and dry.  Psychiatric: She has a normal mood and affect. Her speech is normal and behavior is normal. Thought content normal.  Nursing note and vitals reviewed.  ED Treatments / Results  Labs (all labs ordered are listed, but only abnormal results are displayed) Labs Reviewed  CBC -  Abnormal; Notable for the following:       Result Value   HCT 35.9 (*)    All other components within normal limits  BASIC METABOLIC PANEL - Abnormal; Notable for the following:    Glucose, Bld 106 (*)    BUN 22 (*)    Creatinine, Ser 1.26 (*)    GFR calc non Af Amer 38 (*)    GFR calc Af Amer 44 (*)    All other components within normal limits  I-STAT TROPOININ, ED    EKG  EKG Interpretation  Date/Time:  Monday December 22 2016 23:44:42 EST Ventricular Rate:  49 PR Interval:    QRS Duration: 119 QT Interval:  506 QTC Calculation: 457 R Axis:   -46 Text Interpretation:  Sinus bradycardia LAD, consider left anterior fascicular block Left ventricular hypertrophy No significant change since last tracing Confirmed by Maryan Rued  MD, Loree Fee ([id number]) on 12/23/2016 12:08:18 AM       Radiology No results found.  Procedures Procedures (including critical care time)  Medications Ordered in ED Medications  doxazosin (CARDURA) tablet 2 mg (2 mg Oral Given 12/22/16 2214)  hydrochlorothiazide (HYDRODIURIL) tablet 50 mg (not administered)  cloNIDine (CATAPRES) tablet 0.1 mg (0.1 mg Oral Given 12/22/16 2159)     Initial Impression / Assessment and Plan / ED Course  I have reviewed the triage vital signs and the nursing notes.  Pertinent labs & imaging results that were available during my care of the patient were reviewed by me and considered in my medical  decision making (see chart for details).  Final Clinical Impressions(s) / ED Diagnoses  {I have reviewed and evaluated the relevant laboratory values. {I have reviewed and evaluated the relevant imaging studies. {I have interpreted the relevant EKG. {I have reviewed the relevant previous healthcare records.  {I obtained HPI from historian. {Patient discussed with supervising physician.  ED Course:  Assessment: Pt is a 86yF with hx Afib on Xarelto, HTN who presents with hypertensive urgency as well as headache. Notes hx same. Sent from Cardiologist office for BP control. Pt has extensive work up in past for HTN with unremarkable findings. On exam, pt in NAD. Nontoxic/nonseptic appearing. VSS. BP 200/73 initially. No CP/SOB. Afebrile. Lungs CTA. Heart RRR. Abdomen nontender soft. Consulted with Cardiology. Recommends CBC/BMP/Trop. EKG unremarkable. Given HCTZ 50mg , Clonidine 0.1mg  , and Cardura 2mg  in ED with improvement of blood pressure. CBC/BMP/trop unremarkable. No AKI. Trop negative. Seen and discussed with supervising physician. Plan is to DC home with follow up to Cardiology. At time of discharge, Patient is in no acute distress. Vital Signs are stable. Patient is able to ambulate. Patient able to tolerate PO.   Disposition/Plan:  DC Home Additional Verbal discharge instructions given and discussed with patient.  Pt Instructed to f/u with Cardiology in the next week for evaluation and treatment of symptoms. Return precautions given Pt acknowledges and agrees with plan  Supervising Physician Blanchie Dessert, MD  Final diagnoses:  Hypertension, unspecified type    New Prescriptions New Prescriptions   No medications on file     Shary Decamp, PA-C 12/23/16 0015    Blanchie Dessert, MD 12/23/16 1513

## 2016-12-22 NOTE — ED Notes (Signed)
Patient's daughter up to desk.  States they were sent here from PCP for BP.  Concerned about patient waiting in lobby.  Reassured daughter and Mickel Baas, EMT is now rechecking patient's BP.  Will address with pt/family.

## 2016-12-23 ENCOUNTER — Telehealth: Payer: Self-pay | Admitting: Cardiology

## 2016-12-23 NOTE — Telephone Encounter (Signed)
Spoke with patient's daughter (DPR on file) and made her aware of prior recommendations made to patient earlier in the phone note. The daughter wanted to make sure that we were aware that her BP dropped to 95/40 last night after taking the doxazosin. She states that her mother's last BP was 119/64 with HR 55 and wanted to know if her mother should take the doxazosin tonight. I advised the daughter that her mother should check her BP before taking the doxazosin. I told her if her systolic BP was less than 100 that she should hold it for now since the patient has been symptomatic. I told her that the message would be routed to Dr. Curt Bears and his RN for review. She verbalized understanding.

## 2016-12-23 NOTE — Telephone Encounter (Signed)
Follow up      Pt c/o BP issue: STAT if pt c/o blurred vision, one-sided weakness or slurred speech  1. What are your last 5 BP readings? 119-64 without taking cardura, HR 58 2. Are you having any other symptoms (ex. Dizziness, headache, blurred vision, passed out)?  Dizziness, headache and blurred vision  3. What is your BP issue?  Now pt is concerned that bp is too low.  Should bp take the cardura?

## 2016-12-23 NOTE — Telephone Encounter (Signed)
New Message     Pt states that doxazosin (CARDURA) 2 MG tablet is causing her to feel off.   Pt c/o medication issue:  1. Name of Medication: doxazosin (CARDURA) 2 MG tablet  2. How are you currently taking this medication (dosage and times per day)? Yes, she has only taken 2 doses and she takes this at breakfast, lunch, and dinner  3. Are you having a reaction (difficulty breathing--STAT)? No  4. What is your medication issue? Making pt feel dizzy, light headed, she states that she is feeling depressed

## 2016-12-23 NOTE — Telephone Encounter (Signed)
No answer. Left message for daughter to call back.

## 2016-12-23 NOTE — Telephone Encounter (Signed)
The patient went to the ED yesterday for hypertension (systolics in the 924M) after her office visit. The patient did not require any IV antihypertensives and was sent home. Patient states that she has been feeling lightheaded and dizzy since starting the doxazosin. She has taken 2 doses. Patient states that she has stopped taking Diovan HCTZ, she has started taking valsartan 320 mg Qd, furosemide 20 mEq QD and doxazosin 2 mg Qd. The patient's Bps have been 122/48 HR 51 and 119/64 HR 55. The patient feels that her symptoms of being lightheaded and dizzy are related to the doxazosin. I explained to the patient that it is common to have some lightheadedness and dizziness after first starting this medication and that it usually resolves in a few days. I advised the patient to stay hydrated and not to get up and down quickly. I advised the patient if these symptoms do not improve in a few days or if her symptoms worsen to give [place] a call back. Patient was advised to check BP frequently. Patient verbalized understanding and is in agreement with this plan.

## 2016-12-24 ENCOUNTER — Telehealth: Payer: Self-pay | Admitting: Cardiology

## 2016-12-24 NOTE — Telephone Encounter (Signed)
Appt made for Friday at 9am to review/discuss BP. Dtr reported pt BP dropped into the 90s once yesterday. Today she reports BP this morning was 122/44 and this afternoon 128/52.  We had a lengthy discussion for almost 30 minutes about their ordeal in the E.D. Tuesday, pt and her BPs and how she is feeling. Dtr reports pt only taking Cardura 1mg  today.  Advised to continue on 1mg  for now (take another 1mg  if BP is elevated) and will address more on Friday in the HTN clinic. She understands I will not change Cardura dosage in pt's chart until she meets w/ pharmacist on Friday.  (also informed Georgina Peer, pharmacist)  She verbalized understanding, will continue monitoring mom and agreeable to plan.

## 2016-12-24 NOTE — Telephone Encounter (Signed)
New message     Per pt daughter, she states that Dr Curt Bears said to enroll her mother into a bp clinic?

## 2016-12-26 ENCOUNTER — Encounter: Payer: Self-pay | Admitting: Pharmacist

## 2016-12-26 ENCOUNTER — Ambulatory Visit (INDEPENDENT_AMBULATORY_CARE_PROVIDER_SITE_OTHER): Payer: Medicare Other | Admitting: Pharmacist

## 2016-12-26 VITALS — BP 142/82 | HR 58 | Wt 165.4 lb

## 2016-12-26 DIAGNOSIS — I10 Essential (primary) hypertension: Secondary | ICD-10-CM

## 2016-12-26 MED ORDER — HYDRALAZINE HCL 50 MG PO TABS: 75.0000 mg | ORAL_TABLET | Freq: Three times a day (TID) | ORAL | 3 refills | Status: DC

## 2016-12-26 NOTE — Progress Notes (Signed)
Patient ID: [name]                 DOB: 1930/06/08                      MRN: [id number]     HPI: [name] is a 83 y.o. female patient of Dr. Curt Bears with PMH below who presents today for hypertension evaluation. She was seen in clinic by Dr. Curt Bears on 12/22/16 at which time her pressure was elevated to 200/84 and she was sent to the ED. She was started on Cardura 2mg  QHS, but called the following day with lightheadedness and lower pressures (119/64). Her dose was cut to 1mg  daily. It was explained that those pressures were actually desirable.   Patient presents today with her daughter. She reports she has been feeling pretty poorly over the last few weeks. She is unsure if this is related to her blood pressure or two her heart being in rhythm. Yesterday her pressure went from 150/50 to 109/66 and she felt poorly all day. She states she was dizzy.  Her pressure maintained at 100s/40-50 for the remainder of the day.   She reports she feels trembling internally and this has been going on for several months. She wonders if this is related to her heart rhythm or a recent leg surgery.   Today feel poorly but not as bad a yesterday, but she can tell it is "coming on." She believes this feeling is associated with doxazosin.   Cardiac Hx: HTN, bradycardia, HLD  Current HTN meds:  Valsartan 320mg  daily - started 12/23/16 Hydralazine 50mg  TID Furosemide 20mg  daily Doxazosin 1mg  daily Clonidine 0.1mg  BID  Previously tried: HCTZ amlodipine  BP goal: <140/90 given age   Home BP readings:   Wt Readings from Last 3 Encounters:  12/26/16 165 lb 6.4 oz (75 kg)  12/22/16 164 lb (74.4 kg)  12/22/16 164 lb 12.8 oz (74.8 kg)   BP Readings from Last 3 Encounters:  12/26/16 (!) 142/82  12/23/16 (!) 160/51  12/22/16 (!) 200/84   Pulse Readings from Last 3 Encounters:  12/26/16 (!) 58  12/23/16 (!) 53  12/22/16 (!) 56    Renal function: Estimated Creatinine Clearance: 33 mL/min (by  C-G formula based on SCr of 1.26 mg/dL (H)).  Past Medical History:  Diagnosis Date  . Aortic valve sclerosis   . Bradycardia   . Cancer ([place])    basal cell carcinoma near eyes  . Chronic cough   . Dysrhythmia    A Fib  . Edema of lower extremity   . GERD (gastroesophageal reflux disease)   . H/O one miscarriage   . Hard of hearing    has hearing aides but does not wear   . Heart murmur   . Hip pain    left  . History of bladder infections    08/2015  . History of blood transfusion   . History of hiatal hernia   . History of kidney stones   . Hyperlipidemia   . Hypertension   . Leg pain, left   . OA (osteoarthritis)   . Obesity   . Renal insufficiency   . Tinnitus   . Urinary incontinence   . Varicose veins   . Wears glasses     Current Outpatient Prescriptions on File Prior to Visit  Medication Sig Dispense Refill  . ALPRAZolam (XANAX) 0.5 MG tablet Take 0.5 mg by mouth 3 (three) times daily as  needed for anxiety.     Marland Kitchen amiodarone (PACERONE) 200 MG tablet Take 1 tablet (200 mg total) by mouth daily. 30 tablet 6  . cloNIDine (CATAPRES) 0.1 MG tablet Take 1 tablet (0.1 mg total) by mouth 2 (two) times daily. 60 tablet 4  . Cyanocobalamin (VITAMIN B-12 PO) Take 1 tablet by mouth daily with breakfast.    . doxazosin (CARDURA) 2 MG tablet Take 1 tablet (2 mg total) by mouth daily. (Patient taking differently: Take 1 mg by mouth daily. ) 30 tablet 3  . furosemide (LASIX) 20 MG tablet Take 1 tablet (20 mg total) by mouth daily. 90 tablet 3  . Multiple Vitamin (MULTIVITAMIN) tablet Take 1 tablet by mouth every other day.     . polyethylene glycol (MIRALAX / GLYCOLAX) packet Take 17 g by mouth 2 (two) times daily. (Patient taking differently: Take 17 g by mouth daily as needed for mild constipation. ) 14 each 0  . Pyridoxine HCl (VITAMIN B-6 PO) Take 1 tablet by mouth daily.    . Rivaroxaban (XARELTO) 15 MG TABS tablet Take 1 tablet (15 mg total) by mouth daily with supper.  (Patient taking differently: Take 15 mg by mouth daily with lunch. ) 30 tablet 5  . valsartan (DIOVAN) 320 MG tablet Take 1 tablet (320 mg total) by mouth daily. 90 tablet 3  . busPIRone (BUSPAR) 5 MG tablet Take 5 mg by mouth 2 (two) times daily.    Marland Kitchen zolpidem (AMBIEN) 5 MG tablet Take 5 mg by mouth daily as needed for sleep.     No current facility-administered medications on file prior to visit.     Allergies  Allergen Reactions  . Ace Inhibitors Cough  . Celebrex [Celecoxib] Other (See Comments)    Reflux  . Codeine Nausea Only  . Norvasc [Amlodipine Besylate] Cough    Higher doses = VERY BAD COUGHING   . Vioxx [Rofecoxib] Other (See Comments)    Pt unsure of reaction    Blood pressure (!) 142/82, pulse (!) 58, weight 165 lb 6.4 oz (75 kg).   Assessment/Plan: Hypertension: BP today more elevated in office than has been at home per her cuff. Will stop doxazosin as she believes this may be contributing to her feeling poorly. Will increase hydralazine to 75mg  TID. Continue to monitor. Follow up in 3 weeks.    Thank you, Lelan Pons. Patterson Hammersmith, [facility]  12/26/2016 10:55 AM

## 2016-12-26 NOTE — Patient Instructions (Addendum)
Return for a follow up appointment in 3 weeks  Check your blood pressure at home daily (if able) and keep record of the readings.  Take your BP meds as follows: STOP doxazosin  INCREASE hydralazine to 75mg  three times a day.   [phone]  Bring all of your meds, your BP cuff and your record of home blood pressures to your next appointment.  Exercise as you're able, try to walk approximately 30 minutes per day.  Keep salt intake to a minimum, especially watch canned and prepared boxed foods.  Eat more fresh fruits and vegetables and fewer canned items.  Avoid eating in fast food restaurants.    HOW TO TAKE YOUR BLOOD PRESSURE: . Rest 5 minutes before taking your blood pressure. .  Don't smoke or drink caffeinated beverages for at least 30 minutes before. . Take your blood pressure before (not after) you eat. . Sit comfortably with your back supported and both feet on the floor (don't cross your legs). . Elevate your arm to heart level on a table or a desk. . Use the proper sized cuff. It should fit smoothly and snugly around your bare upper arm. There should be enough room to slip a fingertip under the cuff. The bottom edge of the cuff should be 1 inch above the crease of the elbow. . Ideally, take 3 measurements at one sitting and record the average.

## 2016-12-30 ENCOUNTER — Encounter: Payer: Self-pay | Admitting: Cardiology

## 2016-12-30 ENCOUNTER — Other Ambulatory Visit: Payer: Self-pay | Admitting: Cardiology

## 2016-12-30 ENCOUNTER — Ambulatory Visit (INDEPENDENT_AMBULATORY_CARE_PROVIDER_SITE_OTHER): Payer: Medicare Other | Admitting: Cardiology

## 2016-12-30 VITALS — BP 160/88 | HR 58 | Ht 64.0 in | Wt 163.8 lb

## 2016-12-30 DIAGNOSIS — Z01812 Encounter for preprocedural laboratory examination: Secondary | ICD-10-CM | POA: Diagnosis not present

## 2016-12-30 DIAGNOSIS — I495 Sick sinus syndrome: Secondary | ICD-10-CM

## 2016-12-30 LAB — CBC
HEMATOCRIT: 36.6 % (ref 34.0–46.6)
HEMOGLOBIN: 12.5 g/dL (ref 11.1–15.9)
MCH: 31.5 pg (ref 26.6–33.0)
MCHC: 34.2 g/dL (ref 31.5–35.7)
MCV: 92 fL (ref 79–97)
Platelets: 213 10*3/uL (ref 150–379)
RBC: 3.97 x10E6/uL (ref 3.77–5.28)
RDW: 13.6 % (ref 12.3–15.4)
WBC: 6 10*3/uL (ref 3.4–10.8)

## 2016-12-30 LAB — BASIC METABOLIC PANEL
BUN/Creatinine Ratio: 25 (ref 12–28)
BUN: 38 mg/dL — AB (ref 8–27)
CALCIUM: 9.7 mg/dL (ref 8.7–10.3)
CO2: 25 mmol/L (ref 18–29)
Chloride: 97 mmol/L (ref 96–106)
Creatinine, Ser: 1.55 mg/dL — ABNORMAL HIGH (ref 0.57–1.00)
GFR calc non Af Amer: 31 — ABNORMAL LOW (ref >59)
GFR, EST AFRICAN AMERICAN: 35 — AB (ref >59)
Glucose: 95 mg/dL (ref 65–99)
Potassium: 4.2 mmol/L (ref 3.5–5.2)
Sodium: 140 mmol/L (ref 134–144)

## 2016-12-30 NOTE — Progress Notes (Signed)
Electrophysiology Office Note   Date:  12/30/2016   ID:  [name], DOB 1929-12-31, MRN [id number]  PCP:  Chesley Noon, MD  Primary Electrophysiologist:  Constance Haw, MD    Chief Complaint  Patient presents with  . Follow-up    Persistent Afib     History of Present Illness: [name] is a 83 y.o. female who presents today for electrophysiology evaluation.   She has a history of chronic atrial fibrillation, hypertension. Currently, she is on rivaroxaban for anticoagulation. She continues to feel weak and fatigued. She says that she has some good days and some bad days. Today, she says that she has a headache. This occurs at times when her BP is elevated. Her home BP meds have been adjusted without much change in the readings. She presented to the office with blood pressures over 182 systolic and headaches. She was sent to the emergency room, and with their management got her blood pressure down to a more reasonable level. She presents today for evaluation of bradycardia after wearing a 30 day monitor. Her monitor showed evidence of junctional rhythm with rates in the 30s during waking hours.   Today, she denies symptoms of chest pain, PND, lower extremity edema, claudication, dizziness, presyncope, syncope, bleeding, or neurologic sequela. The patient is tolerating medications without difficulties and is otherwise without complaint today.    Past Medical History:  Diagnosis Date  . Aortic valve sclerosis   . Bradycardia   . Cancer ([place])    basal cell carcinoma near eyes  . Chronic cough   . Dysrhythmia    A Fib  . Edema of lower extremity   . GERD (gastroesophageal reflux disease)   . H/O one miscarriage   . Hard of hearing    has hearing aides but does not wear   . Heart murmur   . Hip pain    left  . History of bladder infections    08/2015  . History of blood transfusion   . History of hiatal hernia   . History of kidney stones   .  Hyperlipidemia   . Hypertension   . Leg pain, left   . OA (osteoarthritis)   . Obesity   . Renal insufficiency   . Tinnitus   . Urinary incontinence   . Varicose veins   . Wears glasses    Past Surgical History:  Procedure Laterality Date  . BREAST SURGERY     cyst removed per right breast 1977  . CARDIOVASCULAR STRESS TEST  06/25/2006   EF 62%  . CARDIOVERSION N/A 07/25/2016   Procedure: CARDIOVERSION;  Surgeon: Lelon Perla, MD;  Location: [facility];  Service: Cardiovascular;  Laterality: N/A;  . CYST REMOVAL FROM OVARY  2002  . DILATION AND CURETTAGE OF UTERUS    . EYE SURGERY     cataract surgery bilat   . NASAL SEPTUM SURGERY    . TEE WITHOUT CARDIOVERSION N/A 12/16/2016   Procedure: TRANSESOPHAGEAL ECHOCARDIOGRAM (TEE);  Surgeon: Larey Dresser, MD;  Location: [facility];  Service: Cardiovascular;  Laterality: N/A;  . TOTAL HIP ARTHROPLASTY Left 12/18/2015   Procedure: LEFT TOTAL HIP ARTHROPLASTY ANTERIOR APPROACH;  Surgeon: Paralee Cancel, MD;  Location: [facility] ORS;  Service: Orthopedics;  Laterality: Left;     Current Outpatient Prescriptions  Medication Sig Dispense Refill  . ALPRAZolam (XANAX) 0.5 MG tablet Take 0.5 mg by mouth 3 (three) times daily as needed for anxiety.     Marland Kitchen  amiodarone (PACERONE) 200 MG tablet Take 1 tablet (200 mg total) by mouth daily. 30 tablet 6  . busPIRone (BUSPAR) 5 MG tablet Take 5 mg by mouth 2 (two) times daily.    . cloNIDine (CATAPRES) 0.1 MG tablet Take 1 tablet (0.1 mg total) by mouth 2 (two) times daily. 60 tablet 4  . Cyanocobalamin (VITAMIN B-12 PO) Take 1 tablet by mouth daily with breakfast.    . furosemide (LASIX) 20 MG tablet Take 1 tablet (20 mg total) by mouth daily. 90 tablet 3  . hydrALAZINE (APRESOLINE) 50 MG tablet Take 1.5 tablets (75 mg total) by mouth 3 (three) times daily. 270 tablet 3  . Multiple Vitamin (MULTIVITAMIN) tablet Take 1 tablet by mouth every other day.     . polyethylene glycol (MIRALAX / GLYCOLAX)  packet Take 17 g by mouth daily as needed for mild constipation, moderate constipation or severe constipation.    . Pyridoxine HCl (VITAMIN B-6 PO) Take 1 tablet by mouth daily.    . Rivaroxaban (XARELTO) 15 MG TABS tablet Take 15 mg by mouth daily with supper.    . valsartan (DIOVAN) 320 MG tablet Take 1 tablet (320 mg total) by mouth daily. 90 tablet 3  . zolpidem (AMBIEN) 5 MG tablet Take 5 mg by mouth daily as needed for sleep.     No current facility-administered medications for this visit.     Allergies:   Ace inhibitors; Celebrex [celecoxib]; Codeine; Norvasc [amlodipine besylate]; and Vioxx [rofecoxib]   Social History:  The patient  reports that she has never smoked. She has never used smokeless tobacco. She reports that she does not drink alcohol or use drugs.   Family History:  The patient's family history includes Diabetes in her mother; Heart disease in her father; Hypertension in her brother and sister.    ROS:  Please see the history of present illness.   Otherwise, review of systems is positive for none.   All other systems are reviewed and negative.    PHYSICAL EXAM: VS:  BP (!) 160/88   Pulse (!) 58   Ht 5\' 4"  (1.626 m)   Wt 163 lb 12.8 oz (74.3 kg)   BMI 28.12 kg/m  , BMI Body mass index is 28.12 kg/m. GEN: Well nourished, well developed, in no acute distress  HEENT: normal  Neck: no JVD, carotid bruits, or masses Cardiac: RRR; 2/6 systolic murmur at the apex, no rubs, or gallops,no edema  Respiratory:  clear to auscultation bilaterally, normal work of breathing GI: soft, nontender, nondistended, + BS MS: no deformity or atrophy  Skin: warm and dry Neuro:  Strength and sensation are intact Psych: euthymic mood, full affect  EKG:  EKG is not ordered today. Personal review of the ekg ordered 12/23/16 shows sinus bradycardia, left axis deviation, nonspecific interventricular conduction delay  Recent  Labs: 11/19/2016: ALT 23; TSH 1.140 12/22/2016: BUN 22;  Creatinine, Ser 1.26; Hemoglobin 12.1; Platelets 175; Potassium 3.6; Sodium 136    Lipid Panel  No results found for: CHOL, TRIG, HDL, CHOLHDL, VLDL, LDLCALC, LDLDIRECT   Wt Readings from Last 3 Encounters:  12/30/16 163 lb 12.8 oz (74.3 kg)  12/26/16 165 lb 6.4 oz (75 kg)  12/22/16 164 lb (74.4 kg)      Other studies Reviewed: Additional studies/ records that were reviewed today include: TTE 2013  Review of the above records today demonstrates:  - Left ventricle: The cavity size was normal. Wall thickness was increased in a pattern of mild  LVH. Systolic function was normal. The estimated ejection fraction was in the range of 50% to 55%. Wall motion was normal; there were no regional wall motion abnormalities. Features are consistent with a pseudonormal left ventricular filling pattern, with concomitant abnormal relaxation and increased filling pressure (grade 2 diastolic dysfunction). - Aortic valve: Mild regurgitation. - Mitral valve: Mild regurgitation. - Left atrium: The atrium was mildly dilated. - Right atrium: The atrium was mildly dilated. - Tricuspid valve: Mild-moderate regurgitation. - Pulmonary arteries: PA peak pressure: 40mm Hg (S).  30 day monitor 11/19/16 Average heart rate for the monitored period was 48 BPM. Tachycardia was present for <1% of the readable data; Bradycardia was present for 93% of the readable data. Atrial Fibrillation was noted during 1% of the readable data. 2:1 AV block seen in early morning sleeping hours HR of 30 seen during waking hours in sinus rhythm  TEE 12/16/16 Normal LV size with mild LV hypertrophy.  EF 60-65%.  Normal wall motion.  Normal RV size and systolic function.  Moderate left atrial enlargement with no LA appendage thrombus.  Mild right atrial enlargement.  Mild to moderate TR, peak RV-RA gradient 54 mmHg.  Trileaflet aortic valve with no stenosis or regurgitation.  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  The posterior leaflet was somewhat fixed.  There was moderate eccentric mitral regurgitation.  ERO 0.15 cm^2 by PISA (probably somewhat under-estimated).  There was no systolic flow reversal in the pulmonary vein doppler pattern.  No PFO/ASD by color doppler.  Normal caliber thoracic aorta with mild plaque.    ASSESSMENT AND PLAN:  1.  Persistent atrial fibrillation: Currently takes rivaroxaban. Was started on amiodarone and cardioverted to sinus rhythm. In sinus today with APCs. Continue amiodarone.   This patients CHA2DS2-VASc Score and unadjusted Ischemic Stroke Rate (% per year) is equal to 4.8 % stroke rate/year from a score of 4  Above score calculated as 1 point each if present [CHF, HTN, DM, Vascular=MI/PAD/Aortic Plaque, Age if 65-74, or Female] Above score calculated as 2 points each if present [Age > 75, or Stroke/TIA/TE]   2. Hypertension: We'll pressure is improved today, at 166/88. She has follow-up scheduled in hypertension clinic.  3. Mitral valve prolapse with regurgitation: TEE shows moderate regurgitation. Plan to switch her to lasix from HCTZ to hopefully have a more aggressive effect on LA pressure and thus AF control.  4. Sick sinus syndrome: She has episodes of bradycardia while during waking hours as well as atrial fibrillation. I discussed the option of pacemaker placement. Risks and benefits were discussed. Risks include bleeding, tamponade, infection, and pneumothorax. She understands these risks and has agreed to the procedure.  Current medicines are reviewed at length with the patient today.   The patient does not have concerns regarding her medicines.  The following changes were made today:  None  Labs/ tests ordered today include:  Orders Placed This Encounter  Procedures  . Basic Metabolic Panel (BMET)  . CBC     Disposition:   FU with Cameo Schmiesing 3 months  Signed, Fraida Veldman Meredith Leeds, MD    12/30/2016 10:28 AM     [place] [address] [place] [address] [phone] (office) [phone] (fax)

## 2016-12-30 NOTE — Patient Instructions (Addendum)
Medication Instructions:  Your physician recommends that you continue on your current medications as directed. Please refer to the Current Medication list given to you today.   Labwork: TODAY: BMET / CBC   Testing/Procedures: Your physician has recommended that you have a pacemaker inserted. A pacemaker is a small device that is placed under the skin of your chest or abdomen to help control abnormal heart rhythms. This device uses electrical pulses to prompt the heart to beat at a normal rate. Pacemakers are used to treat heart rhythms that are too slow. Wire (leads) are attached to the pacemaker that goes into the chambers of you heart. This is done in the hospital and usually requires and overnight stay. Please see the instruction sheet given to you today for more information.    Follow-Up: Follow-up for a wound check in the Device Clinic 10-14 days from 01/05/17 and with Dr. Curt Bears 91 days after 01/05/17.    Any Other Special Instructions Will Be Listed Below ---  Please wash with the CHG Soap the night before and morning of procedure (follow instruction page "Preparing For Surgery").   Report to the [employer] on 01/05/17 at 7:30 AM  Nothing to eat or drink after midnight the night before procedure  Do not take any medication the morning of your procedure  Plan 1 night stay    If you need a refill on your cardiac medications before your next appointment, please call your pharmacy.

## 2016-12-31 ENCOUNTER — Telehealth: Payer: Self-pay | Admitting: Cardiology

## 2016-12-31 ENCOUNTER — Ambulatory Visit: Payer: Medicare Other | Admitting: Cardiology

## 2016-12-31 NOTE — Telephone Encounter (Signed)
Called, spoke with pt. Reviewed pre-procedure instructions. Pt should hold medication the morning of procedure. Daughter had question when pt will resume Xarelto after the procedure. Informed Dr. Curt Bears will give recommendation after the procedure. Daughter verbalized understanding.

## 2016-12-31 NOTE — Telephone Encounter (Signed)
New Message     Pt is having pacemaker put in 01/05/17, they believe that Renae told them to continue taking the Xarelto, is this the correct instructions

## 2017-01-05 ENCOUNTER — Encounter (HOSPITAL_COMMUNITY): Payer: Self-pay | Admitting: General Practice

## 2017-01-05 ENCOUNTER — Ambulatory Visit (HOSPITAL_COMMUNITY)
Admission: RE | Admit: 2017-01-05 | Discharge: 2017-01-06 | Disposition: A | Discharge status: Home / Self Care | Payer: Medicare Other | Source: Ambulatory Visit | Attending: Cardiology | Admitting: Cardiology

## 2017-01-05 ENCOUNTER — Encounter (HOSPITAL_COMMUNITY)
Admission: RE | Disposition: A | Discharge status: Home / Self Care | Payer: Self-pay | Source: Ambulatory Visit | Attending: Cardiology

## 2017-01-05 DIAGNOSIS — K5909 Other constipation: Secondary | ICD-10-CM | POA: Diagnosis not present

## 2017-01-05 DIAGNOSIS — I10 Essential (primary) hypertension: Secondary | ICD-10-CM | POA: Diagnosis not present

## 2017-01-05 DIAGNOSIS — Y838 Other surgical procedures as the cause of abnormal reaction of the patient, or of later complication, without mention of misadventure at the time of the procedure: Secondary | ICD-10-CM | POA: Insufficient documentation

## 2017-01-05 DIAGNOSIS — F419 Anxiety disorder, unspecified: Secondary | ICD-10-CM | POA: Insufficient documentation

## 2017-01-05 DIAGNOSIS — I495 Sick sinus syndrome: Secondary | ICD-10-CM

## 2017-01-05 DIAGNOSIS — Z885 Allergy status to narcotic agent status: Secondary | ICD-10-CM | POA: Insufficient documentation

## 2017-01-05 DIAGNOSIS — I481 Persistent atrial fibrillation: Secondary | ICD-10-CM | POA: Diagnosis not present

## 2017-01-05 DIAGNOSIS — R001 Bradycardia, unspecified: Secondary | ICD-10-CM | POA: Diagnosis not present

## 2017-01-05 DIAGNOSIS — I97638 Postprocedural hematoma of a circulatory system organ or structure following other circulatory system procedure: Secondary | ICD-10-CM | POA: Diagnosis not present

## 2017-01-05 HISTORY — DX: Unspecified atrial fibrillation: I48.91

## 2017-01-05 HISTORY — DX: Sick sinus syndrome: I49.5

## 2017-01-05 HISTORY — PX: PACEMAKER IMPLANT: EP1218

## 2017-01-05 HISTORY — DX: Basal cell carcinoma of skin, unspecified: C44.91

## 2017-01-05 HISTORY — DX: Anxiety disorder, unspecified: F41.9

## 2017-01-05 LAB — SURGICAL PCR SCREEN
MRSA, PCR: NEGATIVE
STAPHYLOCOCCUS AUREUS: NEGATIVE

## 2017-01-05 SURGERY — PACEMAKER IMPLANT

## 2017-01-05 MED ORDER — HYDRALAZINE HCL 50 MG PO TABS
75.0000 mg | ORAL_TABLET | Freq: Three times a day (TID) | ORAL | Status: DC
  Administered 2017-01-05 – 2017-01-06 (×4): 75 mg via ORAL
  Filled 2017-01-05 (×6): qty 1

## 2017-01-05 MED ORDER — HEPARIN (PORCINE) IN NACL 2-0.9 UNIT/ML-% IJ SOLN
INTRAMUSCULAR | Status: DC | PRN
  Administered 2017-01-05: 500 mL

## 2017-01-05 MED ORDER — LIDOCAINE HCL (PF) 1 % IJ SOLN
INTRAMUSCULAR | Status: DC | PRN
  Administered 2017-01-05: 60 mL

## 2017-01-05 MED ORDER — DOXAZOSIN MESYLATE 2 MG PO TABS
2.0000 mg | ORAL_TABLET | Freq: Every day | ORAL | Status: DC
  Administered 2017-01-06: 2 mg via ORAL
  Filled 2017-01-05 (×2): qty 1

## 2017-01-05 MED ORDER — VITAMIN B-6 100 MG PO TABS
100.0000 mg | ORAL_TABLET | ORAL | Status: DC
  Filled 2017-01-05: qty 1

## 2017-01-05 MED ORDER — LIDOCAINE HCL (PF) 1 % IJ SOLN
INTRAMUSCULAR | Status: AC
  Filled 2017-01-05: qty 30

## 2017-01-05 MED ORDER — MIDAZOLAM HCL 5 MG/5ML IJ SOLN
INTRAMUSCULAR | Status: DC | PRN
  Administered 2017-01-05 (×3): 1 mg via INTRAVENOUS

## 2017-01-05 MED ORDER — FUROSEMIDE 20 MG PO TABS
20.0000 mg | ORAL_TABLET | Freq: Every day | ORAL | Status: DC
  Administered 2017-01-05 – 2017-01-06 (×2): 20 mg via ORAL
  Filled 2017-01-05 (×2): qty 1

## 2017-01-05 MED ORDER — MAGNESIUM OXIDE 400 (241.3 MG) MG PO TABS
400.0000 mg | ORAL_TABLET | Freq: Every day | ORAL | Status: DC | PRN
  Administered 2017-01-05: 400 mg via ORAL
  Filled 2017-01-05: qty 1

## 2017-01-05 MED ORDER — FENTANYL CITRATE (PF) 100 MCG/2ML IJ SOLN
25.0000 ug | INTRAMUSCULAR | Status: DC | PRN
  Administered 2017-01-05: 25 ug via INTRAVENOUS

## 2017-01-05 MED ORDER — PANTOPRAZOLE SODIUM 40 MG PO TBEC
40.0000 mg | DELAYED_RELEASE_TABLET | Freq: Every day | ORAL | Status: DC
  Administered 2017-01-05 – 2017-01-06 (×2): 40 mg via ORAL
  Filled 2017-01-05 (×2): qty 1

## 2017-01-05 MED ORDER — MUPIROCIN 2 % EX OINT
1.0000 "application " | TOPICAL_OINTMENT | Freq: Once | CUTANEOUS | Status: AC
  Administered 2017-01-05: 1 via TOPICAL

## 2017-01-05 MED ORDER — CARVEDILOL 6.25 MG PO TABS
6.2500 mg | ORAL_TABLET | Freq: Two times a day (BID) | ORAL | Status: DC
  Administered 2017-01-05: 6.25 mg via ORAL
  Filled 2017-01-05: qty 1

## 2017-01-05 MED ORDER — HEPARIN (PORCINE) IN NACL 2-0.9 UNIT/ML-% IJ SOLN
INTRAMUSCULAR | Status: AC
  Filled 2017-01-05: qty 500

## 2017-01-05 MED ORDER — ZOLPIDEM TARTRATE 5 MG PO TABS: 5.0000 mg | ORAL_TABLET | Freq: Every day | ORAL | Status: DC | PRN

## 2017-01-05 MED ORDER — CLONIDINE HCL 0.1 MG PO TABS
0.1000 mg | ORAL_TABLET | Freq: Two times a day (BID) | ORAL | Status: DC
  Administered 2017-01-05 – 2017-01-06 (×3): 0.1 mg via ORAL
  Filled 2017-01-05 (×4): qty 1

## 2017-01-05 MED ORDER — SODIUM CHLORIDE 0.9 % IV SOLN
INTRAVENOUS | Status: DC
  Administered 2017-01-05: 09:00:00 via INTRAVENOUS

## 2017-01-05 MED ORDER — IRBESARTAN 150 MG PO TABS
300.0000 mg | ORAL_TABLET | Freq: Every day | ORAL | Status: DC
  Administered 2017-01-05 – 2017-01-06 (×2): 300 mg via ORAL
  Filled 2017-01-05 (×2): qty 2

## 2017-01-05 MED ORDER — BUSPIRONE HCL 5 MG PO TABS
5.0000 mg | ORAL_TABLET | Freq: Two times a day (BID) | ORAL | Status: DC
  Administered 2017-01-05: 2.5 mg via ORAL
  Administered 2017-01-06: 5 mg via ORAL
  Filled 2017-01-05 (×2): qty 1

## 2017-01-05 MED ORDER — BIOTIN 5000 MCG PO TABS: 5000.0000 ug | ORAL_TABLET | ORAL | Status: DC

## 2017-01-05 MED ORDER — FENTANYL CITRATE (PF) 100 MCG/2ML IJ SOLN
INTRAMUSCULAR | Status: AC
  Filled 2017-01-05: qty 2

## 2017-01-05 MED ORDER — ADULT MULTIVITAMIN W/MINERALS CH
1.0000 | ORAL_TABLET | Freq: Every day | ORAL | Status: DC
  Administered 2017-01-05 – 2017-01-06 (×2): 1 via ORAL
  Filled 2017-01-05 (×2): qty 1

## 2017-01-05 MED ORDER — ONDANSETRON HCL 4 MG/2ML IJ SOLN
4.0000 mg | Freq: Four times a day (QID) | INTRAMUSCULAR | Status: DC | PRN
  Administered 2017-01-05: 4 mg via INTRAVENOUS
  Filled 2017-01-05: qty 2

## 2017-01-05 MED ORDER — FENTANYL CITRATE (PF) 100 MCG/2ML IJ SOLN
INTRAMUSCULAR | Status: DC | PRN
  Administered 2017-01-05 (×2): 25 ug via INTRAVENOUS

## 2017-01-05 MED ORDER — RIVAROXABAN 15 MG PO TABS
15.0000 mg | ORAL_TABLET | Freq: Every day | ORAL | Status: DC
  Administered 2017-01-05: 15 mg via ORAL
  Filled 2017-01-05: qty 1

## 2017-01-05 MED ORDER — POLYETHYLENE GLYCOL 3350 17 G PO PACK: 17.0000 g | PACK | Freq: Every day | ORAL | Status: DC | PRN

## 2017-01-05 MED ORDER — MIDAZOLAM HCL 5 MG/5ML IJ SOLN
INTRAMUSCULAR | Status: AC
  Filled 2017-01-05: qty 5

## 2017-01-05 MED ORDER — CEFAZOLIN IN D5W 1 GM/50ML IV SOLN
1.0000 g | Freq: Four times a day (QID) | INTRAVENOUS | Status: AC
  Administered 2017-01-05 – 2017-01-06 (×3): 1 g via INTRAVENOUS
  Filled 2017-01-05 (×3): qty 50

## 2017-01-05 MED ORDER — ACETAMINOPHEN 500 MG PO TABS
ORAL_TABLET | ORAL | Status: AC
  Filled 2017-01-05: qty 2

## 2017-01-05 MED ORDER — VITAMIN B-12 100 MCG PO TABS: 500.0000 ug | ORAL_TABLET | ORAL | Status: DC

## 2017-01-05 MED ORDER — AMIODARONE HCL 200 MG PO TABS
200.0000 mg | ORAL_TABLET | Freq: Every day | ORAL | Status: DC
  Administered 2017-01-05 – 2017-01-06 (×2): 200 mg via ORAL
  Filled 2017-01-05 (×2): qty 1

## 2017-01-05 MED ORDER — ACETAMINOPHEN 500 MG PO TABS
1000.0000 mg | ORAL_TABLET | Freq: Every day | ORAL | Status: DC | PRN
  Administered 2017-01-05 – 2017-01-06 (×3): 1000 mg via ORAL
  Filled 2017-01-05 (×3): qty 2

## 2017-01-05 MED ORDER — MORPHINE SULFATE (PF) 2 MG/ML IV SOLN
2.0000 mg | Freq: Once | INTRAVENOUS | Status: AC
Start: 2017-01-05 — End: 2017-01-05
  Administered 2017-01-05: 2 mg via INTRAVENOUS
  Filled 2017-01-05: qty 1

## 2017-01-05 MED ORDER — GENTAMICIN SULFATE 40 MG/ML IJ SOLN
80.0000 mg | INTRAMUSCULAR | Status: AC
  Administered 2017-01-05: 80 mg

## 2017-01-05 MED ORDER — BIOTENE DRY MOUTH MT LIQD: 15.0000 mL | OROMUCOSAL | Status: DC | PRN

## 2017-01-05 MED ORDER — CEFAZOLIN SODIUM-DEXTROSE 2-4 GM/100ML-% IV SOLN
2.0000 g | INTRAVENOUS | Status: AC
  Administered 2017-01-05: 2 g via INTRAVENOUS

## 2017-01-05 MED ORDER — MUPIROCIN 2 % EX OINT
TOPICAL_OINTMENT | CUTANEOUS | Status: AC
  Filled 2017-01-05: qty 22

## 2017-01-05 MED ORDER — ALPRAZOLAM 0.25 MG PO TABS: 0.2500 mg | ORAL_TABLET | Freq: Two times a day (BID) | ORAL | Status: DC | PRN

## 2017-01-05 MED ORDER — SODIUM CHLORIDE 0.9 % IR SOLN
Status: AC
  Filled 2017-01-05: qty 2

## 2017-01-05 MED ORDER — CEFAZOLIN SODIUM-DEXTROSE 2-4 GM/100ML-% IV SOLN
INTRAVENOUS | Status: AC
  Filled 2017-01-05: qty 100

## 2017-01-05 SURGICAL SUPPLY — 9 items
CABLE SURGICAL S-101-97-12 (CABLE) ×2 IMPLANT
IPG PACE AZUR XT DR MRI W1DR01 (Pacemaker) IMPLANT
LEAD CAPSURE NOVUS 45CM (Lead) ×2 IMPLANT
LEAD CAPSURE NOVUS 5076-52CM (Lead) ×2 IMPLANT
PACE AZURE XT DR MRI W1DR01 (Pacemaker) ×3 IMPLANT
PAD DEFIB LIFELINK (PAD) ×2 IMPLANT
SHEATH CLASSIC 7F (SHEATH) ×4 IMPLANT
TRAY PACEMAKER INSERTION (PACKS) ×2 IMPLANT
WIRE HI TORQ VERSACORE-J 145CM (WIRE) ×2 IMPLANT

## 2017-01-05 NOTE — Discharge Summary (Signed)
ELECTROPHYSIOLOGY PROCEDURE DISCHARGE SUMMARY    Patient ID: [name],  MRN: [id number], DOB/AGE: 1930/09/01 83 y.o.  Admit date: 01/05/2017 Discharge date: 01/06/2017  Primary Care Physician: Chesley Noon, MD Electrophysiologist: [facility]  Primary Discharge Diagnosis:  Symptomatic bradycardia status post pacemaker implantation this admission  Secondary Discharge Diagnosis:  1.  Persistent atrial fibrillation 2.  Hypertension 3.  Moderate MR  Allergies  Allergen Reactions  . Ace Inhibitors Cough  . Celebrex [Celecoxib] Other (See Comments)    Reflux  . Codeine Nausea Only  . Norvasc [Amlodipine Besylate] Cough    Higher doses = VERY BAD COUGHING   . Vioxx [Rofecoxib] Other (See Comments)    Pt unsure of reaction     Procedures This Admission:  1.  Implantation of a MDT dual chamber PPM on 01/05/17 by Dr Curt Bears.  The patient received a MDT model number Azure PPM with model number 5076 right atrial lead and 5076 right ventricular lead. There were no immediate post procedure complications. 2.  CXR on 01/06/17 demonstrated no pneumothorax status post device implantation.   Brief HPI: [name] is a 83 y.o. female with a past medical history as outlined above. She has persistent atrial fibrillation requiring medical therapy as well as symptomatic bradycardia. Risks, benefits, and alternatives to PPM implantation were reviewed with the patient who wished to proceed.   Hospital Course:  The patient was admitted and underwent implantation of a MDT dual chamber pacemaker with details as outlined above.  She  was monitored on telemetry overnight which demonstrated atrial pacing with intrinsic ventricular conduction.  Left chest was without hematoma or ecchymosis.  The device was interrogated and found to be functioning normally.  CXR was obtained and demonstrated no pneumothorax status post device implantation.  Wound care, arm mobility, and restrictions were reviewed  with the patient.  The patient was examined and considered stable for discharge to home.   She had small hematoma post device implant, Kaysha Parsell hold Xarelto with next dose Saturday.    Physical Exam: Vitals:   01/05/17 1225 01/05/17 1230 01/05/17 1235 01/05/17 2035  BP: (!) 195/62 (!) 192/49 (!) 188/50 (!) 167/64  Pulse: 60 62 66 60  Resp: 16 (!) 21 19 17   Temp:    97.7 F (36.5 C)  TempSrc:    Oral  SpO2: 96% 95% 96% 97%  Weight:      Height:        GEN- The patient is well appearing, alert and oriented x 3 today.   HEENT: normocephalic, atraumatic; sclera clear, conjunctiva pink; hearing intact; oropharynx clear; neck supple  Lungs- Clear to ausculation bilaterally, normal work of breathing.  No wheezes, rales, rhonchi Heart- Regular rate and rhythm, 2/6 SEM GI- soft, non-tender, non-distended, bowel sounds present  Extremities- no clubbing, cyanosis, or edema; DP/PT/radial pulses 2+ bilaterally MS- no significant deformity or atrophy Skin- warm and dry, no rash or lesion, left chest without hematoma/ecchymosis Psych- euthymic mood, full affect Neuro- strength and sensation are intact   Labs:   Lab Results  Component Value Date   WBC 6.0 12/30/2016   HGB 12.1 12/22/2016   HCT 36.6 12/30/2016   MCV 92 12/30/2016   PLT 213 12/30/2016     Recent Labs Lab 12/30/16 1103  NA 140  K 4.2  CL 97  CO2 25  BUN 38*  CREATININE 1.55*  CALCIUM 9.7  GLUCOSE 95    Discharge Medications:  Allergies as of 01/06/2017  Reactions   Ace Inhibitors Cough   Celebrex [celecoxib] Other (See Comments)   Reflux   Codeine Nausea Only   Norvasc [amlodipine Besylate] Cough   Higher doses = VERY BAD COUGHING   Vioxx [rofecoxib] Other (See Comments)   Pt unsure of reaction      Medication List    TAKE these medications   acetaminophen 500 MG tablet Commonly known as:  TYLENOL Take 1,000 mg by mouth daily as needed for moderate pain or headache.   ALPRAZolam 0.5 MG  tablet Commonly known as:  XANAX Take 0.25-0.5 mg by mouth 2 (two) times daily as needed for anxiety.   amiodarone 200 MG tablet Commonly known as:  PACERONE Take 1 tablet (200 mg total) by mouth daily.   antiseptic oral rinse Liqd 15 mLs by Mouth Rinse route as needed for dry mouth.   Biotin 5000 MCG Tabs Take 5,000 mcg by mouth every other day.   busPIRone 5 MG tablet Commonly known as:  BUSPAR Take 5 mg by mouth 2 (two) times daily.   carvedilol 12.5 MG tablet Commonly known as:  COREG Take 1 tablet (12.5 mg total) by mouth 2 (two) times daily with a meal.   cloNIDine 0.1 MG tablet Commonly known as:  CATAPRES Take 1 tablet (0.1 mg total) by mouth 2 (two) times daily.   doxazosin 2 MG tablet Commonly known as:  CARDURA Take 2 mg by mouth daily.   furosemide 20 MG tablet Commonly known as:  LASIX Take 1 tablet (20 mg total) by mouth daily.   hydrALAZINE 50 MG tablet Commonly known as:  APRESOLINE Take 1.5 tablets (75 mg total) by mouth 3 (three) times daily.   Magnesium 500 MG Caps Take 500 mg by mouth daily as needed (leg cramps).   multivitamin tablet Take 1 tablet by mouth daily.   pantoprazole 40 MG tablet Commonly known as:  PROTONIX Take 40 mg by mouth daily.   polyethylene glycol packet Commonly known as:  MIRALAX / GLYCOLAX Take 17 g by mouth daily as needed for mild constipation, moderate constipation or severe constipation.   pyridOXINE 100 MG tablet Commonly known as:  VITAMIN B-6 Take 100 mg by mouth every other day.   REFRESH OP Apply 1 drop to eye daily as needed (dry eyes).   Rivaroxaban 15 MG Tabs tablet Commonly known as:  XARELTO Take 1 tablet (15 mg total) by mouth daily with lunch. Take next dose 01/10/17 What changed:  additional instructions   valsartan 320 MG tablet Commonly known as:  DIOVAN Take 1 tablet (320 mg total) by mouth daily.   vitamin B-12 500 MCG tablet Commonly known as:  CYANOCOBALAMIN Take 500 mcg by mouth  every other day.   zolpidem 5 MG tablet Commonly known as:  AMBIEN Take 5 mg by mouth daily as needed for sleep.       Disposition:  Discharge Instructions    Diet - low sodium heart healthy    Complete by:  As directed    Increase activity slowly    Complete by:  As directed      Follow-up Information    [facility] Follow up on 01/19/2017.   Specialty:  Cardiology Why:  at [facility] for wound check  Contact information: [address] [place] [phone]       Tekeisha Hakim Meredith Leeds, MD Follow up on 04/14/2017.   Specialty:  Cardiology Why:  at 11AM Contact information:  [address] [place] [address] [phone]           Duration of Discharge Encounter: Greater than 30 minutes including physician time.  Signed, Chanetta Marshall, NP 01/06/2017 7:21 AM  I have seen and examined this patient with Chanetta Marshall.  Agree with above, note added to reflect my findings.  On exam, regular rhythm, 2/6 systolic murmur at the apex, lungs clear. Had a Medtronic dual chamber pacemaker placed for sick sinus syndrome. Chest x-ray and interrogation without major abnormality. Patient did have postop hematoma with a pressure dressing placed over the wound. Plan for discharge today with follow-up in wound clinic. She [facility] device [facility] if her hematoma expands.    [place] M. Jackolyn Geron MD 01/06/2017 7:36 AM

## 2017-01-05 NOTE — H&P (Signed)
[name] is a 83 y.o. female with a history of atrial fibrillation and sick sinus syndrome. She wore a monitor which showed junctional bradycardia during waking hours with symptoms of fatigue and weakness. On exam, regular rhythm, 2/6 murmur at the apex, lungs clear. Risks and benefits of pacemaker placement were discussed. Risks include but not limited to bleeding, infection, tamponade, pneumothorax. The patient understands the risks and has agreed to the procedure.  Granvil Djordjevic Curt Bears, MD 01/05/2017 10:15 AM

## 2017-01-06 ENCOUNTER — Ambulatory Visit (HOSPITAL_COMMUNITY): Payer: Medicare Other

## 2017-01-06 DIAGNOSIS — I481 Persistent atrial fibrillation: Secondary | ICD-10-CM | POA: Diagnosis not present

## 2017-01-06 DIAGNOSIS — I517 Cardiomegaly: Secondary | ICD-10-CM | POA: Diagnosis not present

## 2017-01-06 DIAGNOSIS — Z885 Allergy status to narcotic agent status: Secondary | ICD-10-CM | POA: Diagnosis not present

## 2017-01-06 DIAGNOSIS — I10 Essential (primary) hypertension: Secondary | ICD-10-CM | POA: Diagnosis not present

## 2017-01-06 DIAGNOSIS — Z4682 Encounter for fitting and adjustment of non-vascular catheter: Secondary | ICD-10-CM | POA: Diagnosis not present

## 2017-01-06 DIAGNOSIS — I495 Sick sinus syndrome: Principal | ICD-10-CM

## 2017-01-06 MED ORDER — CARVEDILOL 12.5 MG PO TABS
12.5000 mg | ORAL_TABLET | Freq: Two times a day (BID) | ORAL | Status: DC
  Administered 2017-01-06: 12.5 mg via ORAL
  Filled 2017-01-06: qty 1

## 2017-01-06 MED ORDER — RIVAROXABAN 15 MG PO TABS: 15.0000 mg | ORAL_TABLET | Freq: Every day | ORAL | Status: DC

## 2017-01-06 MED ORDER — CARVEDILOL 12.5 MG PO TABS: 12.5000 mg | ORAL_TABLET | Freq: Two times a day (BID) | ORAL | 1 refills | Status: DC

## 2017-01-06 MED ORDER — ONDANSETRON HCL 4 MG PO TABS
4.0000 mg | ORAL_TABLET | Freq: Three times a day (TID) | ORAL | Status: DC | PRN
  Administered 2017-01-06: 4 mg via ORAL
  Filled 2017-01-06: qty 1

## 2017-01-06 NOTE — Plan of Care (Signed)
Problem: Cardiac: Goal: Ability to achieve and maintain adequate cardiopulmonary perfusion will improve Outcome: Adequate for Discharge Left chest pressure dressing removed by Chanetta Marshall NP this am at bedside.  Small hematoma with bruising noted to left chest.  Patient instructed by Dr. Curt Bears to not to resume Xarelto until Saturday.

## 2017-01-06 NOTE — Discharge Instructions (Signed)
Supplemental Discharge Instructions for  Pacemaker/Defibrillator Patients  Activity No heavy lifting or vigorous activity with your left/right arm for 6 to 8 weeks.  Do not raise your left/right arm above your head for one week.  Gradually raise your affected arm as drawn below.           __        01/09/17                  01/10/17                         01/11/17                       01/12/17  NO DRIVING for  1 week   ; you may begin driving on  11/15/08   .  WOUND CARE - Keep the wound area clean and dry.  Do not get this area wet for one week. No showers for one week; you may shower on  01/12/17   . - The tape/steri-strips on your wound will fall off; do not pull them off.  No bandage is needed on the site.  DO  NOT apply any creams, oils, or ointments to the wound area. - If you notice any drainage or discharge from the wound, any swelling or bruising at the site, or you develop a fever > 101? F after you are discharged home, call the office at once.  Special Instructions - You are still able to use cellular telephones; use the ear opposite the side where you have your pacemaker/defibrillator.  Avoid carrying your cellular phone near your device. - When traveling through airports, show security personnel your identification card to avoid being screened in the metal detectors.  Ask the security personnel to use the hand wand. - Avoid arc welding equipment, TENS units (transcutaneous nerve stimulators).  Call the office for questions about other devices. - Avoid electrical appliances that are in poor condition or are not properly grounded. - Microwave ovens are safe to be near or to operate.    Pacemaker Implantation, Adult Pacemaker implantation is a procedure to place a pacemaker inside your chest. A pacemaker is a small computer that sends electrical signals to the heart and helps your heart beat normally. A pacemaker also stores information about your heart rhythms. You may need  pacemaker implantation if you:  Have a slow heartbeat (bradycardia).  Faint (syncope).  Have shortness of breath (dyspnea) due to heart problems. The pacemaker attaches to your heart through a wire, called a lead. Sometimes just one lead is needed. Other times, there will be two leads. There are two types of pacemakers:  Transvenous pacemaker. This type is placed under the skin or muscle of your chest. The lead goes through a vein in the chest area to reach the inside of the heart.  Epicardial pacemaker. This type is placed under the skin or muscle of your chest or belly. The lead goes through your chest to the outside of the heart. Tell a health care provider about:  Any allergies you have.  All medicines you are taking, including vitamins, herbs, eye drops, creams, and over-the-counter medicines.  Any problems you or family members have had with anesthetic medicines.  Any blood or bone disorders you have.  Any surgeries you have had.  Any medical conditions you have.  Whether you are pregnant or may be pregnant. What are the  risks? Generally, this is a safe procedure. However, problems may occur, including:  Infection.  Bleeding.  Failure of the pacemaker or the lead.  Collapse of a lung or bleeding into a lung.  Blood clot inside a blood vessel with a lead.  Damage to the heart.  Infection inside the heart (endocarditis).  Allergic reactions to medicines. What happens before the procedure? Staying hydrated  Follow instructions from your health care provider about hydration, which may include:  Up to 2 hours before the procedure - you may continue to drink clear liquids, such as water, clear fruit juice, black coffee, and plain tea. Eating and drinking restrictions  Follow instructions from your health care provider about eating and drinking, which may include:  8 hours before the procedure - stop eating heavy meals or foods such as meat, fried foods, or fatty  foods.  6 hours before the procedure - stop eating light meals or foods, such as toast or cereal.  6 hours before the procedure - stop drinking milk or drinks that contain milk.  2 hours before the procedure - stop drinking clear liquids. Medicines   Ask your health care provider about:  Changing or stopping your regular medicines. This is especially important if you are taking diabetes medicines or blood thinners.  Taking medicines such as aspirin and ibuprofen. These medicines can thin your blood. Do not take these medicines before your procedure if your health care provider instructs you not to.  You may be given antibiotic medicine to help prevent infection. General instructions   You will have a heart evaluation. This may include an electrocardiogram (ECG), chest X-ray, and heart imaging (echocardiogram,  or echo) tests.  You will have blood tests.  Do not use any products that contain nicotine or tobacco, such as cigarettes and e-cigarettes. If you need help quitting, ask your health care provider.  Plan to have someone take you home from the hospital or clinic.  If you will be going home right after the procedure, plan to have someone with you for 24 hours.  Ask your health care provider how your surgical site will be marked or identified. What happens during the procedure?  To reduce your risk of infection:  Your health care team will wash or sanitize their hands.  Your skin will be washed with soap.  Hair may be removed from the surgical area.  An IV tube will be inserted into one of your veins.  You will be given one or more of the following:  A medicine to help you relax (sedative).  A medicine to numb the area (local anesthetic).  A medicine to make you fall asleep (general anesthetic).  If you are getting a transvenous pacemaker:  An incision will be made in your upper chest.  A pocket will be made for the pacemaker. It may be placed under the skin or  between layers of muscle.  The lead will be inserted into a blood vessel that returns to the heart.  While X-rays are taken by an imaging machine (fluoroscopy), the lead will be advanced through the vein to the inside of your heart.  The other end of the lead will be tunneled under the skin and attached to the pacemaker.  If you are getting an epicardial pacemaker:  An incision will be made near your ribs or breastbone (sternum) for the lead.  The lead will be attached to the outside of your heart.  Another incision will be made in your chest  or upper belly to create a pocket for the pacemaker.  The free end of the lead will be tunneled under the skin and attached to the pacemaker.  The transvenous or epicardial pacemaker will be tested. Imaging studies may be done to check the lead position.  The incisions will be closed with stitches (sutures), adhesive strips, or skin glue.  Bandages (dressing) will be placed over the incisions. The procedure may vary among health care providers and hospitals. What happens after the procedure?  Your blood pressure, heart rate, breathing rate, and blood oxygen level will be monitored until the medicines you were given have worn off.  You will be given antibiotics and pain medicine.  ECG and chest x-rays will be done.  You will wear a continuous type of ECG (Holter monitor) to check your heart rhythm.  Your health care provider willprogram the pacemaker.  Do not drive for 24 hours if you received a sedative. This information is not intended to replace advice given to you by your health care provider. Make sure you discuss any questions you have with your health care provider. Document Released: 10/17/2002 Document Revised: 05/16/2016 Document Reviewed: 04/09/2016 Elsevier Interactive Patient Education  2017 [employer].

## 2017-01-06 NOTE — Plan of Care (Signed)
Problem: Safety: Goal: Ability to remain free from injury will improve Outcome: Completed/Met Date Met: 01/06/17 Patient calls for assistance before attempting to ambulate. Bed has remained in lowest position with call bell in reach.  Problem: Pain Managment: Goal: General experience of comfort will improve Outcome: Progressing Patient has experienced some mild chest discomfort r/t pacemaker implantation. Patient has received Tylenol and morphine during this shift which provided some relief.

## 2017-01-06 NOTE — Progress Notes (Signed)
Reviewed discharge instructions with patient and her daughter, they stated their understanding.  Reinforced instructions to not to take Xarelto until Saturday with patient and daughter.  Temporary Pacemaker card given to daughter.  Discharged home with daughter via wheelchair. [name]

## 2017-01-07 ENCOUNTER — Telehealth: Payer: Self-pay | Admitting: Cardiology

## 2017-01-07 LAB — ECHO TEE
PISA EROA: 0.15 cm2
VTI: 199 cm

## 2017-01-07 NOTE — Telephone Encounter (Signed)
Follow up   Pt daughter calling again because the blood pressure of her mother is dropping slowly

## 2017-01-07 NOTE — Telephone Encounter (Signed)
Dtr tells me that mom's BP is low after taking medications.  Pt is symptomatic with low pressure.  Low this morning was 89/44.  She states that pt had issues w/ BP dropping too low when she was on Cardura before.  Reviewed w/ Camnitz - advised dtr to have pt stop Cardura per Dr. Curt Bears.  She is agreeable to plan. Advised to call office back if BPs still dropping to low after stopping med. Dtr is agreeable to plan and thanks me for helping.

## 2017-01-07 NOTE — Telephone Encounter (Signed)
Pt c/o BP issue: STAT if pt c/o blurred vision, one-sided weakness or slurred speech  1. What are your last 5 BP readings? n/a  2. Are you having any other symptoms (ex. Dizziness, headache, blurred vision, passed out)?  When patient is sitting upright, she becomes nauseous and BP drops  3. What is your BP issue? BP dropped low  Ms. [name] (patient daughter) states that her mother's BP has been dropping real low. She also states that Dr.Camnitz switched medication and would like to discuss this issue. Please call, thanks.

## 2017-01-08 ENCOUNTER — Telehealth: Payer: Self-pay | Admitting: Cardiology

## 2017-01-08 NOTE — Telephone Encounter (Signed)
[name], dtr, calls in reporting BPs still low after taking morning meds. 114/55, 100/44, 84/42. Pt is nauseous, light-headed w/ low BPs. Advised to stop Clonidine. They will call back if this does not solve hypotension issue.

## 2017-01-08 NOTE — Telephone Encounter (Signed)
Pt c/o BP issue: STAT if pt c/o blurred vision, one-sided weakness or slurred speech  1. What are your last 5 BP readings? 84/42  2. Are you having any other symptoms (ex. Dizziness, headache, blurred vision, passed out)? No  3. What is your BP issue? Low  Pt daughter said she is calling to report pt bp

## 2017-01-09 ENCOUNTER — Telehealth: Payer: Self-pay | Admitting: Cardiology

## 2017-01-09 NOTE — Telephone Encounter (Signed)
Waking up pt's BP 123/59.  Later 134/59. Dizziness has improved, but still nauseous.   Advised to continue to monitor for another day/two.  If still nauseous after off d/c medications for several days -  call PCP for evaluation.  (discussed that if BP normal and still nauseous in several days then it is not related to medication/hypotension but to something else) Dtr is agreeable to plan and thanks me for calling.

## 2017-01-09 NOTE — Telephone Encounter (Signed)
New Message     Daughter said you guys changed her mothers medication , her bp went up 134/59 and she is still nauseated   This am 123/59 before medication p

## 2017-01-14 NOTE — Telephone Encounter (Signed)
Followed up w/ pt's dtr about current condition/how she is feeling. Dtr reports pt still experiencing some nausea and she is tired and sleeps a lot. She states pt feels good in the morning when she gets up and then that slowly decreases as the day goes on. They are going to continue monitor and follow up next Monday in device clinic and pharmacist BP check. They will report if nausea still persists at visit next week w/ pharmacist. Advised to call office back in a few weeks if nausea continues and we can discuss w/ Camnitz if Amiodarone needs to be reduced to see if it is the causative factor in her nausea. Dtr is agreeable and thanks me for following up

## 2017-01-19 ENCOUNTER — Ambulatory Visit: Payer: Medicare Other

## 2017-01-19 NOTE — Progress Notes (Deleted)
Patient ID: [name]                 DOB: 06/13/1930                      MRN: [id number]     HPI: [name] is a 83 y.o. female referred by Dr. Curt Roberson to HTN clinic. PMH is significant for HTN, sick sinus syndrome, HLD, bradycardia, and edema. She had a pacemaker implanted on 01/05/17 and called clinic after with reports of hypotension. Her daughter reported that pt's BP was 80s/40s and she was advised to d/c Cardura. A few days later, she called again with BP readings 80s-100s/40-50s and was advised to stop clonidine. Of note, the week prior to her pacemaker her BPs all ranged 140s/80s - 200/80s.  Inc hydralazine. HR too low to titrate coreg. Low dose amlodipine ok? (cough with higher)  Current HTN meds: valsartan 320mg  daily, hydralazine 75mg  TID, carvedilol 12.5mg  BID, furosemide 20mg  daily Previously tried: ACEi - cough, high dose of amlodipine - cough, Cardura and clonidine - hypotension BP goal: <140/41mmHg  Family History: The patient's family history includes Diabetes in her mother; Heart disease in her father; Hypertension in her brother and sister.   Social History: The patient  reports that she has never smoked. She has never used smokeless tobacco. She reports that she does not drink alcohol or use drugs.   Diet:   Exercise:   Home BP readings:   Wt Readings from Last 3 Encounters:  01/05/17 162 lb (73.5 kg)  12/30/16 163 lb 12.8 oz (74.3 kg)  12/26/16 165 lb 6.4 oz (75 kg)   BP Readings from Last 3 Encounters:  01/05/17 (!) 167/64  12/30/16 (!) 160/88  12/26/16 (!) 142/82   Pulse Readings from Last 3 Encounters:  01/05/17 60  12/30/16 (!) 58  12/26/16 (!) 58    Renal function: Estimated Creatinine Clearance: 26.5 mL/min (by C-G formula based on SCr of 1.55 mg/dL (H)).  Past Medical History:  Diagnosis Date  . Anxiety   . Aortic valve sclerosis   . Atrial fibrillation ([place])    hx/notes 01/05/2017  . Basal cell carcinoma    hx; near eyes  .  Bradycardia   . Chronic cough   . Dysrhythmia   . Edema of lower extremity   . GERD (gastroesophageal reflux disease)   . H/O one miscarriage   . Hard of hearing    has hearing aides but does not wear   . Heart murmur   . Hip pain    left  . History of bladder infections    08/2015  . History of blood transfusion    "when I had a miscarriage"  . History of hiatal hernia   . History of kidney stones   . Hyperlipidemia   . Hypertension   . Junctional bradycardia    hx/notes 01/05/2017  . Leg pain, left   . OA (osteoarthritis)    "right pointer; right hip" (01/05/2017)  . Obesity   . Pneumonia 1951  . Presence of permanent cardiac pacemaker   . Renal insufficiency   . SSS (sick sinus syndrome) ([place]) 01/05/2017   hx/notes 01/05/2017  . Tinnitus   . Urinary incontinence   . Varicose veins   . Wears glasses     Current Outpatient Prescriptions on File Prior to Visit  Medication Sig Dispense Refill  . acetaminophen (TYLENOL) 500 MG tablet Take 1,000 mg by mouth daily as  needed for moderate pain or headache.    . ALPRAZolam (XANAX) 0.5 MG tablet Take 0.25-0.5 mg by mouth 2 (two) times daily as needed for anxiety.     Marland Kitchen amiodarone (PACERONE) 200 MG tablet Take 1 tablet (200 mg total) by mouth daily. 30 tablet 6  . antiseptic oral rinse (BIOTENE) LIQD 15 mLs by Mouth Rinse route as needed for dry mouth.    . Biotin 5000 MCG TABS Take 5,000 mcg by mouth every other day.    . busPIRone (BUSPAR) 5 MG tablet Take 5 mg by mouth 2 (two) times daily.    . carvedilol (COREG) 12.5 MG tablet Take 1 tablet (12.5 mg total) by mouth 2 (two) times daily with a meal. 60 tablet 1  . furosemide (LASIX) 20 MG tablet Take 1 tablet (20 mg total) by mouth daily. 90 tablet 3  . hydrALAZINE (APRESOLINE) 50 MG tablet Take 1.5 tablets (75 mg total) by mouth 3 (three) times daily. 270 tablet 3  . Magnesium 500 MG CAPS Take 500 mg by mouth daily as needed (leg cramps).    . Multiple Vitamin (MULTIVITAMIN)  tablet Take 1 tablet by mouth daily.     . pantoprazole (PROTONIX) 40 MG tablet Take 40 mg by mouth daily.    . polyethylene glycol (MIRALAX / GLYCOLAX) packet Take 17 g by mouth daily as needed for mild constipation, moderate constipation or severe constipation.    . Polyvinyl Alcohol-Povidone (REFRESH OP) Apply 1 drop to eye daily as needed (dry eyes).    . pyridOXINE (VITAMIN B-6) 100 MG tablet Take 100 mg by mouth every other day.    . Rivaroxaban (XARELTO) 15 MG TABS tablet Take 1 tablet (15 mg total) by mouth daily with lunch. Take next dose 01/10/17 42 tablet   . valsartan (DIOVAN) 320 MG tablet Take 1 tablet (320 mg total) by mouth daily. 90 tablet 3  . vitamin B-12 (CYANOCOBALAMIN) 500 MCG tablet Take 500 mcg by mouth every other day.    . zolpidem (AMBIEN) 5 MG tablet Take 5 mg by mouth daily as needed for sleep.     No current facility-administered medications on file prior to visit.     Allergies  Allergen Reactions  . Ace Inhibitors Cough  . Celebrex [Celecoxib] Other (See Comments)    Reflux  . Codeine Nausea Only  . Norvasc [Amlodipine Besylate] Cough    Higher doses = VERY BAD COUGHING   . Vioxx [Rofecoxib] Other (See Comments)    Pt unsure of reaction     Assessment/Plan:

## 2017-01-20 ENCOUNTER — Ambulatory Visit: Payer: Medicare Other

## 2017-01-20 ENCOUNTER — Ambulatory Visit: Payer: Medicare Other | Admitting: Physician Assistant

## 2017-01-23 ENCOUNTER — Telehealth: Payer: Self-pay | Admitting: Cardiology

## 2017-01-23 DIAGNOSIS — Z96642 Presence of left artificial hip joint: Secondary | ICD-10-CM | POA: Diagnosis not present

## 2017-01-23 DIAGNOSIS — Z471 Aftercare following joint replacement surgery: Secondary | ICD-10-CM | POA: Diagnosis not present

## 2017-01-23 NOTE — Telephone Encounter (Signed)
Spoke w/ pt daughter and she stated that had her device implanted on 01-05-17. Pt wound check was originally scheduled on 01-19-17 but rescheduled to 02-04-17 b/c of weather. After consulting w/ device clinic RN offered pt an appt w/ device clinic for Friday 01-23-17 or Monday 01-26-2017. Pt daughter agreed to Monday 01-26-17 at 2:00 PM.

## 2017-01-26 ENCOUNTER — Ambulatory Visit (INDEPENDENT_AMBULATORY_CARE_PROVIDER_SITE_OTHER): Payer: Medicare Other | Admitting: *Deleted

## 2017-01-26 DIAGNOSIS — R001 Bradycardia, unspecified: Secondary | ICD-10-CM

## 2017-01-26 DIAGNOSIS — S61212A Laceration without foreign body of right middle finger without damage to nail, initial encounter: Secondary | ICD-10-CM | POA: Diagnosis not present

## 2017-01-26 DIAGNOSIS — Z23 Encounter for immunization: Secondary | ICD-10-CM | POA: Diagnosis not present

## 2017-01-26 DIAGNOSIS — I10 Essential (primary) hypertension: Secondary | ICD-10-CM | POA: Diagnosis not present

## 2017-01-26 NOTE — Progress Notes (Signed)
Wound check appointment. Steri-strips removed. Wound without redness or edema. Incision edges approximated, wound well healed. Normal device function. Thresholds, sensing, and impedances consistent with implant measurements. Device programmed at 3.5V programmed on for extra safety margin until 3 month visit. Histogram distribution appropriate for patient and level of activity. No mode switches or high ventricular rates noted. Patient educated about wound care, arm mobility, lifting restrictions. ROV 04/14/17 w/ WC

## 2017-01-28 ENCOUNTER — Ambulatory Visit (INDEPENDENT_AMBULATORY_CARE_PROVIDER_SITE_OTHER): Payer: Medicare Other | Admitting: Pharmacist

## 2017-01-28 VITALS — BP 116/70 | HR 64

## 2017-01-28 DIAGNOSIS — I10 Essential (primary) hypertension: Secondary | ICD-10-CM | POA: Diagnosis not present

## 2017-01-28 NOTE — Progress Notes (Signed)
Patient ID: KAELEI WHEELER                 DOB: Jan 04, 1930                      MRN: [id number]     HPI: [name] is a 83 y.o. female referred by Dr. Dr. Curt Bears to HTN clinic for follow up. PMH is significant for chronic afib and HTN. She was seen in clinic by Dr. Curt Bears on 12/22/16 at which time her pressure was elevated to 200/84 and she was sent to the ED. She was started on Cardura 2mg  QHS, but called the following day with lightheadedness and lower pressures (119/60mmHg). Her dose was cut to 1mg  daily. It was explained that those pressures were actually desirable. At f/u visit in HTN clinic, she noted that she had been feeling poorly and dizzy. Doxazosin was discontinued. Hydralazine was increased to 75mg  TID. Three weeks ago, she had a pacemaker implanted. Dr. Curt Bears discontinued clonidine and started carvedilol 12.5 mg BID. She says her HR has been much more controlled, and she overall feels much better.  Patient presents today with complaints of feeling poorly, and her BP dropped to the 90s this morning after taking medications. Denies dizziness. Sometimes feels foggy in the mornings, and she thinks it is related to blood pressures. She notes that she is most worried about sometimes having her blood pressures "bottom out" about an hour after taking her morning antihypertensive medications. She checks her BP at least 3-4 times a day, and sometimes multiple times at a single sitting. Her BP readings fluctuate throughout the day and most readings are actually elevated with systolic BP ranging 673-419F. Rarely has a reading in the low 100s. Pt likely feeling symptomatic comparative to extremely elevated baseline of 200.  Current HTN meds:  Valsartan 320mg  daily- lunchtime Hydralazine 75mg  TID Furosemide 20mg  daily Carvedilol 12.5 BID  Previously tried: HCTZ, amlodipine (coughing at higher doses)  BP goal: <140/92mmHg given age  Home BP readings: Systolic ranges in the 79K-240X. Very  variable from day to day and throughout the day. Most systolic readings are 735-329J with rare low readings.  Wt Readings from Last 3 Encounters:  01/05/17 162 lb (73.5 kg)  12/30/16 163 lb 12.8 oz (74.3 kg)  12/26/16 165 lb 6.4 oz (75 kg)   BP Readings from Last 3 Encounters:  01/05/17 (!) 167/64  12/30/16 (!) 160/88  12/26/16 (!) 142/82   Pulse Readings from Last 3 Encounters:  01/05/17 60  12/30/16 (!) 58  12/26/16 (!) 58    Renal function: CrCl cannot be calculated (Patient's most recent lab result is older than the maximum 21 days allowed.).  Past Medical History:  Diagnosis Date  . Anxiety   . Aortic valve sclerosis   . Atrial fibrillation ([place])    hx/notes 01/05/2017  . Basal cell carcinoma    hx; near eyes  . Bradycardia   . Chronic cough   . Dysrhythmia   . Edema of lower extremity   . GERD (gastroesophageal reflux disease)   . H/O one miscarriage   . Hard of hearing    has hearing aides but does not wear   . Heart murmur   . Hip pain    left  . History of bladder infections    08/2015  . History of blood transfusion    "when I had a miscarriage"  . History of hiatal hernia   . History of  kidney stones   . Hyperlipidemia   . Hypertension   . Junctional bradycardia    hx/notes 01/05/2017  . Leg pain, left   . OA (osteoarthritis)    "right pointer; right hip" (01/05/2017)  . Obesity   . Pneumonia 1951  . Presence of permanent cardiac pacemaker   . Renal insufficiency   . SSS (sick sinus syndrome) ([place]) 01/05/2017   hx/notes 01/05/2017  . Tinnitus   . Urinary incontinence   . Varicose veins   . Wears glasses     Current Outpatient Prescriptions on File Prior to Visit  Medication Sig Dispense Refill  . acetaminophen (TYLENOL) 500 MG tablet Take 1,000 mg by mouth daily as needed for moderate pain or headache.    . ALPRAZolam (XANAX) 0.5 MG tablet Take 0.25-0.5 mg by mouth 2 (two) times daily as needed for anxiety.     Marland Kitchen amiodarone (PACERONE) 200 MG  tablet Take 1 tablet (200 mg total) by mouth daily. 30 tablet 6  . antiseptic oral rinse (BIOTENE) LIQD 15 mLs by Mouth Rinse route as needed for dry mouth.    . Biotin 5000 MCG TABS Take 5,000 mcg by mouth every other day.    . busPIRone (BUSPAR) 5 MG tablet Take 5 mg by mouth 2 (two) times daily.    . carvedilol (COREG) 12.5 MG tablet Take 1 tablet (12.5 mg total) by mouth 2 (two) times daily with a meal. 60 tablet 1  . furosemide (LASIX) 20 MG tablet Take 1 tablet (20 mg total) by mouth daily. 90 tablet 3  . hydrALAZINE (APRESOLINE) 50 MG tablet Take 1.5 tablets (75 mg total) by mouth 3 (three) times daily. 270 tablet 3  . Magnesium 500 MG CAPS Take 500 mg by mouth daily as needed (leg cramps).    . Multiple Vitamin (MULTIVITAMIN) tablet Take 1 tablet by mouth daily.     . pantoprazole (PROTONIX) 40 MG tablet Take 40 mg by mouth daily.    . polyethylene glycol (MIRALAX / GLYCOLAX) packet Take 17 g by mouth daily as needed for mild constipation, moderate constipation or severe constipation.    . Polyvinyl Alcohol-Povidone (REFRESH OP) Apply 1 drop to eye daily as needed (dry eyes).    . pyridOXINE (VITAMIN B-6) 100 MG tablet Take 100 mg by mouth every other day.    . Rivaroxaban (XARELTO) 15 MG TABS tablet Take 1 tablet (15 mg total) by mouth daily with lunch. Take next dose 01/10/17 42 tablet   . valsartan (DIOVAN) 320 MG tablet Take 1 tablet (320 mg total) by mouth daily. 90 tablet 3  . vitamin B-12 (CYANOCOBALAMIN) 500 MCG tablet Take 500 mcg by mouth every other day.    . zolpidem (AMBIEN) 5 MG tablet Take 5 mg by mouth daily as needed for sleep.     No current facility-administered medications on file prior to visit.     Allergies  Allergen Reactions  . Ace Inhibitors Cough  . Celebrex [Celecoxib] Other (See Comments)    Reflux  . Codeine Nausea Only  . Norvasc [Amlodipine Besylate] Cough    Higher doses = VERY BAD COUGHING   . Vioxx [Rofecoxib] Other (See Comments)    Pt unsure  of reaction     Assessment/Plan:  Hypertension: Blood pressure is fairly controlled at home and mostly at goal of <140/35mmHg although BP readings have fluctuated. 1. To reduce morning episodes of hypotension, will decrease morning hydralazine dose to 50 mg. Continue 75 mg at lunch and  evening doses.  2. Continue valsartan 320mg  daily, furosemide 20 mg daily, and carvedilol 12.5 mg BID.  We discussed that it was not necessary to check BP that frequently throughout the day, and that she could start just checking once a day, in the morning, to document the effect of the decreased morning hydralazine administration. She says she still feels poorly when her BPs are controlled (120-130s), and we explained that it takes time for her body to adjust to her BP no longer being in the 200s.  Follow up in 4 weeks to check tolerability and BP control.    Patient was seen with Courtney Heys, PharmD Candidate 2018.   Megan E. Supple, PharmD, CPP, [place] [address], [place], [place] [address] Phone: [phone]; Fax: [phone] 01/28/2017 9:38 AM

## 2017-01-28 NOTE — Patient Instructions (Addendum)
Return for a a follow up appointment in 3-4 weeks.  Your blood pressure today is 116/70.   Check your blood pressure at home [place] and keep record of the readings.  Take your BP meds WITH FOOD as follows: Valsartan 320mg  daily at lunch time Hydralazine - 50 mg in the morning, and 75 mg at lunch and in the evening.  Carvedilol 12.5 mg twice daily  Bring all of your meds, your BP cuff and your record of home blood pressures to your next appointment.  Exercise as you're able, try to walk approximately 30 minutes per day.  Keep salt intake to a minimum, especially watch canned and prepared boxed foods.  Eat more fresh fruits and vegetables and fewer canned items.  Avoid eating in fast food restaurants.    HOW TO TAKE YOUR BLOOD PRESSURE: . Rest 5 minutes before taking your blood pressure. .  Don't smoke or drink caffeinated beverages for at least 30 minutes before. . Take your blood pressure before (not after) you eat. . Sit comfortably with your back supported and both feet on the floor (don't cross your legs). . Elevate your arm to heart level on a table or a desk. . Use the proper sized cuff. It should fit smoothly and snugly around your bare upper arm. There should be enough room to slip a fingertip under the cuff. The bottom edge of the cuff should be 1 inch above the crease of the elbow. . Ideally, take 3 measurements at one sitting and record the average.

## 2017-02-04 ENCOUNTER — Ambulatory Visit: Payer: Medicare Other

## 2017-02-07 DIAGNOSIS — Z4802 Encounter for removal of sutures: Secondary | ICD-10-CM | POA: Diagnosis not present

## 2017-02-07 DIAGNOSIS — S61212D Laceration without foreign body of right middle finger without damage to nail, subsequent encounter: Secondary | ICD-10-CM | POA: Diagnosis not present

## 2017-02-07 DIAGNOSIS — I10 Essential (primary) hypertension: Secondary | ICD-10-CM | POA: Diagnosis not present

## 2017-02-08 ENCOUNTER — Encounter (HOSPITAL_COMMUNITY): Payer: Self-pay | Admitting: Emergency Medicine

## 2017-02-08 ENCOUNTER — Emergency Department (HOSPITAL_COMMUNITY): Payer: Medicare Other

## 2017-02-08 ENCOUNTER — Emergency Department (HOSPITAL_COMMUNITY)
Admission: EM | Admit: 2017-02-08 | Discharge: 2017-02-08 | Disposition: A | Discharge status: Home / Self Care | Payer: Medicare Other | Attending: Emergency Medicine | Admitting: Emergency Medicine

## 2017-02-08 DIAGNOSIS — Z7901 Long term (current) use of anticoagulants: Secondary | ICD-10-CM | POA: Insufficient documentation

## 2017-02-08 DIAGNOSIS — Z96642 Presence of left artificial hip joint: Secondary | ICD-10-CM | POA: Diagnosis not present

## 2017-02-08 DIAGNOSIS — Z950 Presence of cardiac pacemaker: Secondary | ICD-10-CM | POA: Diagnosis not present

## 2017-02-08 DIAGNOSIS — I10 Essential (primary) hypertension: Secondary | ICD-10-CM | POA: Insufficient documentation

## 2017-02-08 DIAGNOSIS — R404 Transient alteration of awareness: Secondary | ICD-10-CM | POA: Diagnosis not present

## 2017-02-08 DIAGNOSIS — Z85828 Personal history of other malignant neoplasm of skin: Secondary | ICD-10-CM | POA: Insufficient documentation

## 2017-02-08 DIAGNOSIS — R55 Syncope and collapse: Secondary | ICD-10-CM | POA: Insufficient documentation

## 2017-02-08 LAB — CBC WITH DIFFERENTIAL/PLATELET
Basophils Absolute: 0 10*3/uL (ref 0.0–0.1)
Basophils Relative: 0 %
EOS ABS: 0.1 10*3/uL (ref 0.0–0.7)
EOS PCT: 2 %
HCT: 35.3 % — ABNORMAL LOW (ref 36.0–46.0)
HEMOGLOBIN: 11.8 g/dL — AB (ref 12.0–15.0)
LYMPHS ABS: 1.1 10*3/uL (ref 0.7–4.0)
LYMPHS PCT: 23 %
MCH: 31.2 pg (ref 26.0–34.0)
MCHC: 33.4 g/dL (ref 30.0–36.0)
MCV: 93.4 fL (ref 78.0–100.0)
MONOS PCT: 8 %
Monocytes Absolute: 0.4 10*3/uL (ref 0.1–1.0)
NEUTROS PCT: 67 %
Neutro Abs: 3.2 10*3/uL (ref 1.7–7.7)
Platelets: 184 10*3/uL (ref 150–400)
RBC: 3.78 MIL/uL — ABNORMAL LOW (ref 3.87–5.11)
RDW: 13.7 % (ref 11.5–15.5)
WBC: 4.7 10*3/uL (ref 4.0–10.5)

## 2017-02-08 LAB — URINALYSIS, ROUTINE W REFLEX MICROSCOPIC
Bilirubin Urine: NEGATIVE
GLUCOSE, UA: NEGATIVE mg/dL
Hgb urine dipstick: NEGATIVE
Ketones, ur: NEGATIVE mg/dL
Leukocytes, UA: NEGATIVE
NITRITE: NEGATIVE
PH: 7 (ref 5.0–8.0)
Protein, ur: NEGATIVE mg/dL
SPECIFIC GRAVITY, URINE: 1.01 (ref 1.005–1.030)

## 2017-02-08 LAB — BASIC METABOLIC PANEL
Anion gap: 7 (ref 5–15)
BUN: 36 mg/dL — AB (ref 6–20)
CHLORIDE: 104 mmol/L (ref 101–111)
CO2: 28 mmol/L (ref 22–32)
CREATININE: 1.63 mg/dL — AB (ref 0.44–1.00)
Calcium: 9 mg/dL (ref 8.9–10.3)
GFR calc Af Amer: 32 mL/min — ABNORMAL LOW (ref >60)
GFR calc non Af Amer: 28 mL/min — ABNORMAL LOW (ref >60)
GLUCOSE: 147 mg/dL — AB (ref 65–99)
Potassium: 3.6 mmol/L (ref 3.5–5.1)
SODIUM: 139 mmol/L (ref 135–145)

## 2017-02-08 LAB — I-STAT TROPONIN, ED: Troponin i, poc: 0.01 ng/mL (ref 0.00–0.08)

## 2017-02-08 MED ORDER — SODIUM CHLORIDE 0.9 % IV SOLN
INTRAVENOUS | Status: DC
  Administered 2017-02-08: 125 mL/h via INTRAVENOUS

## 2017-02-08 NOTE — Discharge Instructions (Signed)
Follow up your cardiologist, monitor your blood pressure, return for recurrent symptoms

## 2017-02-08 NOTE — ED Triage Notes (Signed)
Per EMS, patient from home.  Had a syncopal episode in bathroom while sitting on toilet.  States she was not bearing down when syncopal episode happened.  No fall or trauma.  Initial BP 98/50 and HR 60.  Final BP 128/80, HR 90, RR 16, 96% on 2L, CBG 141.  AV pacer placed x 4 weeks.  Kicks in when HR drops below 60.  250cc given en route.  18G left wrist.  Hx of Afib and hypertension.  On bloodthinners.  No DM.  Intermittent nausea x 1 week.  No CP nor SOB.

## 2017-02-08 NOTE — ED Notes (Signed)
[employer] pacemaker interrogated.  Report faxed and given to EDP.

## 2017-02-08 NOTE — ED Notes (Signed)
Pacemaker by [employer]. [facility] Nurse coming to help interrogate pacemaker.

## 2017-02-08 NOTE — ED Provider Notes (Signed)
[facility] DEPT Provider Note   CSN: [id number] Arrival date & time: 02/08/17  1552   By signing my name below, I, [name], attest that this documentation has been prepared under the direction and in the presence of Mary Rank, MD . Electronically Signed: Evelene Roberson, Scribe. 02/08/2017. 4:07 PM.   History   Chief Complaint Chief Complaint  Patient presents with  . Loss of Consciousness    The history is provided by the patient. No language interpreter was used.    HPI Comments:  [name] is a 83 y.o. female who presents to the Emergency Department via EMS, complaining of a syncopal episode that occurred just PTA. Pt states she wanted to urinate and defecate so she went to the bathroom and that is where she passed out on the toilet; was not bearing down at the time. No head injury.  She reports associated hypotension today and  reports h/o hypotension. Pt had pacemaker placed ~ 4 weeks ago. She is currently on blood thinners.    Past Medical History:  Diagnosis Date  . Anxiety   . Aortic valve sclerosis   . Atrial fibrillation ([place])    hx/notes 01/05/2017  . Basal cell carcinoma    hx; near eyes  . Bradycardia   . Chronic cough   . Dysrhythmia   . Edema of lower extremity   . GERD (gastroesophageal reflux disease)   . H/O one miscarriage   . Hard of hearing    has hearing aides but does not wear   . Heart murmur   . Hip pain    left  . History of bladder infections    08/2015  . History of blood transfusion    "when I had a miscarriage"  . History of hiatal hernia   . History of kidney stones   . Hyperlipidemia   . Hypertension   . Junctional bradycardia    hx/notes 01/05/2017  . Leg pain, left   . OA (osteoarthritis)    "right pointer; right hip" (01/05/2017)  . Obesity   . Pneumonia 1951  . Presence of permanent cardiac pacemaker   . Renal insufficiency   . SSS (sick sinus syndrome) ([place]) 01/05/2017   hx/notes 01/05/2017  . Tinnitus   .  Urinary incontinence   . Varicose veins   . Wears glasses     Patient Active Problem List   Diagnosis Date Noted  . Sick sinus syndrome ([place]) 01/05/2017  . Overweight (BMI 25.0-29.9) 12/19/2015  . S/P left THA, AA 12/18/2015  . Chronic venous insufficiency 11/16/2015  . Varicose veins of both lower extremities 11/16/2015  . Edema 05/20/2012  . Bradycardia   . Hypertension   . Hyperlipidemia     Past Surgical History:  Procedure Laterality Date  . BREAST CYST EXCISION Right 1977   "benign"  . CARDIOVASCULAR STRESS TEST  06/25/2006   EF 62%  . CARDIOVERSION N/A 07/25/2016   Procedure: CARDIOVERSION;  Surgeon: Lelon Perla, MD;  Location: [facility] ENDOSCOPY;  Service: Cardiovascular;  Laterality: N/A;  . CATARACT EXTRACTION W/ INTRAOCULAR LENS  IMPLANT, BILATERAL Bilateral   . DILATION AND CURETTAGE OF UTERUS    . INSERT / REPLACE / REMOVE PACEMAKER  01/05/2017  . JOINT REPLACEMENT    . NASAL SEPTUM SURGERY    . OVARIAN CYST REMOVAL  2002  . PACEMAKER IMPLANT N/A 01/05/2017   Procedure: Pacemaker Implant;  Surgeon: Will Meredith Leeds, MD;  Location: [facility] CV LAB;  Service: Cardiovascular;  Laterality: N/A;  . TEE WITHOUT CARDIOVERSION N/A 12/16/2016   Procedure: TRANSESOPHAGEAL ECHOCARDIOGRAM (TEE);  Surgeon: Larey Dresser, MD;  Location: [facility];  Service: Cardiovascular;  Laterality: N/A;  . TONSILLECTOMY    . TOTAL HIP ARTHROPLASTY Left 12/18/2015   Procedure: LEFT TOTAL HIP ARTHROPLASTY ANTERIOR APPROACH;  Surgeon: Paralee Cancel, MD;  Location: [facility] ORS;  Service: Orthopedics;  Laterality: Left;    OB History    No data available       Home Medications    Prior to Admission medications   Medication Sig Start Date End Date Taking? Authorizing Provider  ALPRAZolam Duanne Moron) 0.5 MG tablet Take 0.25 mg by mouth at bedtime.    Yes Historical Provider, MD  amiodarone (PACERONE) 200 MG tablet Take 1 tablet (200 mg total) by mouth daily. 07/07/16  Yes Will Meredith Leeds,  MD  antiseptic oral rinse (BIOTENE) LIQD 15 mLs by Mouth Rinse route as needed for dry mouth.   Yes Historical Provider, MD  Biotin 5000 MCG TABS Take 5,000 mcg by mouth every other day.   Yes Historical Provider, MD  busPIRone (BUSPAR) 5 MG tablet Take 5 mg by mouth 2 (two) times daily.   Yes Historical Provider, MD  carvedilol (COREG) 12.5 MG tablet Take 1 tablet (12.5 mg total) by mouth 2 (two) times daily with a meal. 01/06/17  Yes Amber Sena Slate, NP  furosemide (LASIX) 20 MG tablet Take 1 tablet (20 mg total) by mouth daily. 12/22/16 03/22/17 Yes Will Meredith Leeds, MD  hydrALAZINE (APRESOLINE) 50 MG tablet Take 50-75 mg by mouth See admin instructions. Take 50 mg in the morning, 75 mg at lunchtime, and 75 mg in the evening. 01/28/17  Yes Will Meredith Leeds, MD  Magnesium 500 MG CAPS Take 500 mg by mouth daily as needed (leg cramps).   Yes Historical Provider, MD  Multiple Vitamin (MULTIVITAMIN) tablet Take 1 tablet by mouth daily.    Yes Historical Provider, MD  pantoprazole (PROTONIX) 40 MG tablet Take 40 mg by mouth daily.   Yes Historical Provider, MD  polyethylene glycol (MIRALAX / GLYCOLAX) packet Take 17 g by mouth daily as needed for mild constipation, moderate constipation or severe constipation.   Yes Historical Provider, MD  Polyvinyl Alcohol-Povidone (REFRESH OP) Apply 1 drop to eye daily as needed (dry eyes).   Yes Historical Provider, MD  pyridOXINE (VITAMIN B-6) 100 MG tablet Take 100 mg by mouth every other day.   Yes Historical Provider, MD  Rivaroxaban (XARELTO) 15 MG TABS tablet Take 1 tablet (15 mg total) by mouth daily with lunch. Take next dose 01/10/17 01/06/17  Yes Amber Sena Slate, NP  valsartan (DIOVAN) 320 MG tablet Take 1 tablet (320 mg total) by mouth daily. 12/22/16  Yes Will Meredith Leeds, MD  vitamin B-12 (CYANOCOBALAMIN) 500 MCG tablet Take 500 mcg by mouth every other day.   Yes Historical Provider, MD  zolpidem (AMBIEN) 5 MG tablet Take 5 mg by mouth daily as needed  for sleep.   Yes Historical Provider, MD    Family History Family History  Problem Relation Age of Onset  . Diabetes Mother   . Heart disease Father   . Hypertension Sister   . Hypertension Brother     Social History Social History  Substance Use Topics  . Smoking status: Never Smoker  . Smokeless tobacco: Never Used  . Alcohol use No     Allergies   Celebrex [celecoxib]; Codeine; Ace inhibitors; Norvasc [amlodipine besylate]; and Vioxx [rofecoxib]  Review of Systems Review of Systems  Gastrointestinal: Positive for constipation. Negative for blood in stool.  All other systems reviewed and are negative.   10 systems reviewed and all are negative for acute change except as noted in the HPI.   Physical Exam Updated Vital Signs BP (!) 139/54   Pulse (!) 59   Temp 97.4 F (36.3 C) (Oral)   Resp 16   SpO2 96%   Physical Exam  Constitutional: She appears listless. No distress.  HENT:  Head: Normocephalic and atraumatic.  Right Ear: External ear normal.  Left Ear: External ear normal.  Eyes: Conjunctivae are normal. Right eye exhibits no discharge. Left eye exhibits no discharge. No scleral icterus.  Neck: Neck supple. No tracheal deviation present.  Cardiovascular: Normal rate, regular rhythm and intact distal pulses.   Pulmonary/Chest: Effort normal and breath sounds normal. No stridor. No respiratory distress. She has no wheezes. She has no rales.  Abdominal: Soft. Bowel sounds are normal. She exhibits no distension. There is no tenderness. There is no rebound and no guarding.  Musculoskeletal: She exhibits no edema or tenderness.  Neurological: She has normal strength. She appears listless. No cranial nerve deficit (no facial droop, extraocular movements intact, no slurred speech) or sensory deficit. She exhibits normal muscle tone. She displays no seizure activity. Coordination normal.  Skin: Skin is warm and dry. No rash noted. She is not diaphoretic.    Psychiatric: She has a normal mood and affect.  Nursing note and vitals reviewed.    ED Treatments / Results  DIAGNOSTIC STUDIES:  Oxygen Saturation is 96% on 2L, normal by my interpretation.    COORDINATION OF CARE:  4:03 PM Discussed treatment plan with pt at bedside and pt agreed to plan.  Labs (all labs ordered are listed, but only abnormal results are displayed) Labs Reviewed  CBC WITH DIFFERENTIAL/PLATELET - Abnormal; Notable for the following:       Result Value   RBC 3.78 (*)    Hemoglobin 11.8 (*)    HCT 35.3 (*)    All other components within normal limits  BASIC METABOLIC PANEL - Abnormal; Notable for the following:    Glucose, Bld 147 (*)    BUN 36 (*)    Creatinine, Ser 1.63 (*)    GFR calc non Af Amer 28 (*)    GFR calc Af Amer 32 (*)    All other components within normal limits  URINALYSIS, ROUTINE W REFLEX MICROSCOPIC  I-STAT TROPOININ, ED    EKG  EKG Interpretation  Date/Time:  Sunday February 08 2017 16:13:51 EDT Ventricular Rate:  63 PR Interval:    QRS Duration: 127 QT Interval:  527 QTC Calculation: 540 R Axis:   -38 Text Interpretation:  sinus bradycardia with prolonged PR Nonspecific IVCD with LAD Left ventricular hypertrophy Nonspecific T abnormalities, inferior leads Confirmed by Araly Kaas  MD-J, Nikka Hakimian ([id number]) on 02/08/2017 4:21:42 PM Also confirmed by Annaliesa Blann  MD-J, Kennedy Bohanon [phone]), editor [place]  CCT, [place] ([id number])  on 02/08/2017 4:46:20 PM       Radiology Dg Chest Port 1 View  Result Date: 02/08/2017 CLINICAL DATA:  Syncope EXAM: PORTABLE CHEST 1 VIEW COMPARISON:  01/06/2017 FINDINGS: There is no focal parenchymal opacity. There is no pleural effusion or pneumothorax. There is stable cardiomegaly. There is a dual lead cardiac pacemaker. There is osteoarthritis of bilateral glenohumeral joints. IMPRESSION: No active disease. Electronically Signed   By: Kathreen Devoid   On: 02/08/2017 16:30    Procedures Procedures (  including critical care  time)  Medications Ordered in ED Medications  0.9 %  sodium chloride infusion (125 mL/hr Intravenous New Bag/Given 02/08/17 1656)     Initial Impression / Assessment and Plan / ED Course  I have reviewed the triage vital signs and the nursing notes.  Pertinent labs & imaging results that were available during my care of the patient were reviewed by me and considered in my medical decision making (see chart for details).   patient presented to the emergency room after near syncopal/syncopal episode at home. Patient states she does have a history of labile blood pressure.  Her symptoms do sound vagal in nature. She had a prodrome of feeling weak prior to the episode. Patient's laboratory tests in the emergency room reassuring. Her examination including orthostatic vital signs are reassuring. Laboratory tests are unremarkable. Glucose is slightly decreased at 11.8 but I didn't think that is clinically significant.  Creatinine is 1.63 which is slightly increased from 1.551 month ago but again I do not think that is clinically significant. Patient had her pacemaker interrogated. No evidence of ectopy noted.  Patient's symptoms have improved. I discussed admission to the hospital for observation versus discharge.  I don't have any significant concern for cardiac dysrhythmia considering she has a pacemaker and there was not noted during this event.  She is comfortable with discharge. We discussed close follow-up with her cardiologist.  Final Clinical Impressions(s) / ED Diagnoses   Final diagnoses:  Syncope, unspecified syncope type    New Prescriptions New Prescriptions   No medications on file   I personally performed the services described in this documentation, which was scribed in my presence.  The recorded information has been reviewed and is accurate.     Mary Rank, MD 02/08/17 [phone]

## 2017-02-09 ENCOUNTER — Telehealth: Payer: Self-pay | Admitting: Cardiology

## 2017-02-09 NOTE — Telephone Encounter (Addendum)
Reports pt had syncopal episode yesterday at home. Pt had taken her morning medications, eaten breakfast, and went to the bathroom where she could feel something wrong and passed out after calling for husband.   Per EMS note: Pt states she experienced a syncopal episode while sitting on the toilet.  Pt's husband states he was attempting to call the pt through the bathroom door but she was not answering for several minutes. She remembers putting her head down b/t her legs while attempting to have a BM. Pt did not fall/injury herself w/ this episode.  During transportion ECG showed a switch from non-demand atrial pacing to non-demand AV sequential pacing.  Dtr states BP was 90/44 upon arrival of EMS. States this morning before meds BP was 151/80.  Pt took her meds and ate breakfast.  Pt felt dizzy/off balance from 9:30-11:30 this morning. BP around noon was 112/63, HR 78.  Will review w/ pharmacist and Dr. Curt Bears and call dtr back tomorrow.  She is agreeable to plan.

## 2017-02-09 NOTE — Telephone Encounter (Signed)
New Message:   Pt blood pressure dropped yesterday and she passed out it when did. She went to [facility] ER on yesterday and they her to notify her Cardiologist of the ER visit and what happen.

## 2017-02-10 MED ORDER — HYDRALAZINE HCL 25 MG PO TABS: ORAL_TABLET | ORAL | 3 refills | Status: DC

## 2017-02-10 NOTE — Telephone Encounter (Signed)
Pt currently taking Hydralazine as follows: 50 mg at breakfast, 75 mg at dinner and bedtime. Reviewed w/ pharmacist, Jinny Blossom. Instructed pt to decrease Hydralazine to 25 mg at breakfast, and 50 mg at dinner and bedtime. Advised to call office if this does not show improvement and we will address possibly stopping a medication or switching times when we take the BP meds.  Dtr verbalized understanding and agreeable to plan.

## 2017-02-23 ENCOUNTER — Other Ambulatory Visit: Payer: Self-pay | Admitting: Cardiology

## 2017-02-23 DIAGNOSIS — R05 Cough: Secondary | ICD-10-CM | POA: Diagnosis not present

## 2017-02-23 DIAGNOSIS — K219 Gastro-esophageal reflux disease without esophagitis: Secondary | ICD-10-CM | POA: Diagnosis not present

## 2017-02-23 DIAGNOSIS — I482 Chronic atrial fibrillation: Secondary | ICD-10-CM | POA: Diagnosis not present

## 2017-02-23 DIAGNOSIS — I10 Essential (primary) hypertension: Secondary | ICD-10-CM | POA: Diagnosis not present

## 2017-02-24 ENCOUNTER — Other Ambulatory Visit: Payer: Self-pay | Admitting: Cardiology

## 2017-02-25 ENCOUNTER — Other Ambulatory Visit: Payer: Self-pay | Admitting: Cardiology

## 2017-02-27 ENCOUNTER — Ambulatory Visit (INDEPENDENT_AMBULATORY_CARE_PROVIDER_SITE_OTHER): Payer: Medicare Other | Admitting: Pharmacist

## 2017-02-27 VITALS — BP 132/68 | HR 73

## 2017-02-27 DIAGNOSIS — I10 Essential (primary) hypertension: Secondary | ICD-10-CM

## 2017-02-27 NOTE — Patient Instructions (Addendum)
Return for a follow up appointment in 3-4 weeks  Check your blood pressure at home daily (if able) and keep record of the readings.  Take your BP meds as follows: CONTINUE all medications as prescribed.   Bring all of your meds, your BP cuff and your record of home blood pressures to your next appointment.  Exercise as you're able, try to walk approximately 30 minutes per day.  Keep salt intake to a minimum, especially watch canned and prepared boxed foods.  Eat more fresh fruits and vegetables and fewer canned items.  Avoid eating in fast food restaurants.    HOW TO TAKE YOUR BLOOD PRESSURE: . Rest 5 minutes before taking your blood pressure. .  Don't smoke or drink caffeinated beverages for at least 30 minutes before. . Take your blood pressure before (not after) you eat. . Sit comfortably with your back supported and both feet on the floor (don't cross your legs). . Elevate your arm to heart level on a table or a desk. . Use the proper sized cuff. It should fit smoothly and snugly around your bare upper arm. There should be enough room to slip a fingertip under the cuff. The bottom edge of the cuff should be 1 inch above the crease of the elbow. . Ideally, take 3 measurements at one sitting and record the average.

## 2017-02-27 NOTE — Progress Notes (Signed)
Patient ID: [name]                 DOB: May 30, 1930                      MRN: [id number]     HPI: [name] is a 83 y.o. female patient of Dr. Curt Bears to HTN clinic for follow up.PMH is significant for chronic afib and HTN.She was seen in clinic by Dr. Curt Bears on 12/22/16 at which time her pressure was elevated to 200/84 and she was sent to the ED. She was started on Cardura 2mg  QHS, but called the following day with lightheadedness and lower pressures (119/60mmHg). Her dose was cut to 1mg  daily. It was explained that those pressures were actually desirable. At f/u visit in HTN clinic, she noted that she had been feeling poorly and dizzy. Doxazosin was discontinued. Hydralazine was increased to 75mg  TID. Three weeks ago, she had a pacemaker implanted. Dr. Curt Bears discontinued clonidine and started carvedilol 12.5 mg BID. She says her HR has been much more controlled, and she overall feels much better. At her most recent visit with HTN clinic her hydralazine was decreased to 50mg  Qam, 75mg  QPM,HS due to morning hypotension. Which was since further adjusted to 25mg  TID.   She presents with her daughter today stating that she was in the ER on Easter due to an orthostatic episode. She states she has felt better since that day, but her pressures have been creeping back up. She has self titrated her hydralazine back to 50mg  TID a few days ago.   She continues to check her BP about 6-7 times per day. She states she can tell when her is low but never feels when it is high. Based on her readings and note she feels "low" when pressure in 110s and generally this feeling is associated with a drop from 160-170s to 110s over about an hour-2 hour span.   Current HTN meds:  hydralazine 50mg  TID (8am, 5-6pm, 10pm) Valsartan (1230p) Furosemide - (8am) Carvedilol 12.5mg  BID (8am, 5-6pm)   Previously tried: HCTZ, amlodipine (coughing at higher doses)  BP goal: <140/5mmHg given age  Wt Readings from  Last 3 Encounters:  01/05/17 162 lb (73.5 kg)  12/30/16 163 lb 12.8 oz (74.3 kg)  12/26/16 165 lb 6.4 oz (75 kg)   BP Readings from Last 3 Encounters:  02/27/17 132/68  02/08/17 (!) 148/63  01/28/17 116/70   Pulse Readings from Last 3 Encounters:  02/27/17 73  02/08/17 60  01/28/17 64    Renal function: CrCl cannot be calculated (Patient's most recent lab result is older than the maximum 21 days allowed.).  Past Medical History:  Diagnosis Date  . Anxiety   . Aortic valve sclerosis   . Atrial fibrillation ([place])    hx/notes 01/05/2017  . Basal cell carcinoma    hx; near eyes  . Bradycardia   . Chronic cough   . Dysrhythmia   . Edema of lower extremity   . GERD (gastroesophageal reflux disease)   . H/O one miscarriage   . Hard of hearing    has hearing aides but does not wear   . Heart murmur   . Hip pain    left  . History of bladder infections    08/2015  . History of blood transfusion    "when I had a miscarriage"  . History of hiatal hernia   . History of kidney stones   .  Hyperlipidemia   . Hypertension   . Junctional bradycardia    hx/notes 01/05/2017  . Leg pain, left   . OA (osteoarthritis)    "right pointer; right hip" (01/05/2017)  . Obesity   . Pneumonia 1951  . Presence of permanent cardiac pacemaker   . Renal insufficiency   . SSS (sick sinus syndrome) ([place]) 01/05/2017   hx/notes 01/05/2017  . Tinnitus   . Urinary incontinence   . Varicose veins   . Wears glasses     Current Outpatient Prescriptions on File Prior to Visit  Medication Sig Dispense Refill  . valsartan (DIOVAN) 320 MG tablet Take 1 tablet (320 mg total) by mouth daily. 90 tablet 3  . ALPRAZolam (XANAX) 0.5 MG tablet Take 0.25 mg by mouth at bedtime.     Marland Kitchen amiodarone (PACERONE) 200 MG tablet TAKE 1 TABLET(200 MG) BY MOUTH DAILY 30 tablet 2  . antiseptic oral rinse (BIOTENE) LIQD 15 mLs by Mouth Rinse route as needed for dry mouth.    . Biotin 5000 MCG TABS Take 5,000 mcg by  mouth every other day.    . busPIRone (BUSPAR) 5 MG tablet Take 5 mg by mouth 2 (two) times daily.    . carvedilol (COREG) 12.5 MG tablet Take 1 tablet (12.5 mg total) by mouth 2 (two) times daily with a meal. 60 tablet 1  . furosemide (LASIX) 20 MG tablet Take 1 tablet (20 mg total) by mouth daily. 90 tablet 3  . hydrALAZINE (APRESOLINE) 25 MG tablet Take 25 mg in the morning, 50 mg at lunchtime, and 50 mg in the evening. 180 tablet 3  . Magnesium 500 MG CAPS Take 500 mg by mouth daily as needed (leg cramps).    . Multiple Vitamin (MULTIVITAMIN) tablet Take 1 tablet by mouth daily.     . pantoprazole (PROTONIX) 40 MG tablet Take 40 mg by mouth daily.    . polyethylene glycol (MIRALAX / GLYCOLAX) packet Take 17 g by mouth daily as needed for mild constipation, moderate constipation or severe constipation.    . Polyvinyl Alcohol-Povidone (REFRESH OP) Apply 1 drop to eye daily as needed (dry eyes).    . pyridOXINE (VITAMIN B-6) 100 MG tablet Take 100 mg by mouth every other day.    . Rivaroxaban (XARELTO) 15 MG TABS tablet Take 1 tablet (15 mg total) by mouth daily with lunch. Take next dose 01/10/17 42 tablet   . vitamin B-12 (CYANOCOBALAMIN) 500 MCG tablet Take 500 mcg by mouth every other day.    . zolpidem (AMBIEN) 5 MG tablet Take 5 mg by mouth daily as needed for sleep.     No current facility-administered medications on file prior to visit.     Allergies  Allergen Reactions  . Celebrex [Celecoxib] Other (See Comments)    Reflux  . Codeine Nausea Only  . Ace Inhibitors Cough  . Norvasc [Amlodipine Besylate] Cough    Higher doses = VERY BAD COUGHING   . Vioxx [Rofecoxib] Other (See Comments)    Pt unsure of reaction    Blood pressure 132/68, pulse 73.   Assessment/Plan: Hypertension: Pressure is controlled today. It appears she has quite a few fluctuations in pressure throughout the day and her symptoms are associated with this wide variance in pressure. Have asked she continue  hydralazine 50mg  TID for now (she has not really tried this dose for more than 3 days) and keep track of number of times she feels dizzy or that her pressure is low.  She will start monitoring only twice daily. If the dizziness persists could consider decreasing dose of hydralazine and increase dose of carvedilol to see if this would help with stabilizing pressures since hydralazine is shorter acting and may be precipitating these episodes.    Thank you, Lelan Pons. Patterson [place], [place]

## 2017-03-02 ENCOUNTER — Encounter: Payer: Self-pay | Admitting: Pharmacist

## 2017-03-04 ENCOUNTER — Other Ambulatory Visit: Payer: Self-pay | Admitting: Nurse Practitioner

## 2017-03-20 ENCOUNTER — Encounter: Payer: Self-pay | Admitting: Pharmacist

## 2017-03-20 ENCOUNTER — Telehealth: Payer: Self-pay | Admitting: Cardiology

## 2017-03-20 ENCOUNTER — Ambulatory Visit (INDEPENDENT_AMBULATORY_CARE_PROVIDER_SITE_OTHER): Payer: Medicare Other | Admitting: Pharmacist

## 2017-03-20 VITALS — BP 132/74 | HR 79

## 2017-03-20 DIAGNOSIS — I10 Essential (primary) hypertension: Secondary | ICD-10-CM | POA: Diagnosis not present

## 2017-03-20 MED ORDER — CARVEDILOL 12.5 MG PO TABS: 18.7500 mg | ORAL_TABLET | Freq: Two times a day (BID) | ORAL | 9 refills | Status: DC

## 2017-03-20 MED ORDER — HYDRALAZINE HCL 25 MG PO TABS: 25.0000 mg | ORAL_TABLET | Freq: Three times a day (TID) | ORAL | 3 refills | Status: DC

## 2017-03-20 NOTE — Patient Instructions (Addendum)
Return for a follow up appointment in 4-6 weeks  Check your blood pressure at home daily (if able) and keep record of the readings.  Take your BP meds as follows: DECREASE hydralazine to 25mg  three times a day INCREASE carvedilol to 18.75mg  twice daily   Bring all of your meds, your BP cuff and your record of home blood pressures to your next appointment.  Exercise as you're able, try to walk approximately 30 minutes per day.  Keep salt intake to a minimum, especially watch canned and prepared boxed foods.  Eat more fresh fruits and vegetables and fewer canned items.  Avoid eating in fast food restaurants.    HOW TO TAKE YOUR BLOOD PRESSURE: . Rest 5 minutes before taking your blood pressure. .  Don't smoke or drink caffeinated beverages for at least 30 minutes before. . Take your blood pressure before (not after) you eat. . Sit comfortably with your back supported and both feet on the floor (don't cross your legs). . Elevate your arm to heart level on a table or a desk. . Use the proper sized cuff. It should fit smoothly and snugly around your bare upper arm. There should be enough room to slip a fingertip under the cuff. The bottom edge of the cuff should be 1 inch above the crease of the elbow. . Ideally, take 3 measurements at one sitting and record the average.

## 2017-03-20 NOTE — Progress Notes (Signed)
Patient ID: [name]                 DOB: 1930-01-03                      MRN: [id number]     HPI: [name] is a 83 y.o. female patient of Dr. Curt Bears to HTN clinic for follow up.PMH is significant for chronic afib and HTN.She was seen in clinic by Dr. Curt Bears on 12/22/16 at which time her pressure was elevated to 200/84 and she was sent to the ED. She was started on Cardura 2mg  QHS, but called the following day with lightheadedness and lower pressures (119/27mmHg). Her dose was cut to 1mg  daily. It was explained that those pressures were actually desirable. At f/u visit in HTN clinic, she noted that she had been feeling poorly and dizzy. Doxazosin was discontinued. At her most recent visit with HTN clinic she was restarted on hydralazine 50mg  TID (after several fluctuations in dose). Her pressures were tenuous and fluctuated significantly throughout the day.   She presents today with her daughter. She states she has been doing well but still has episodes of dizziness when her pressure drops. She states it has been much better than prior to our last visit. In the mornings before medication her pressure is 150s-160s/80s then about 87minutes - 1 hr after her pressure is down to 90s-120s/50s. Her evening pressures are also generally running in the 140s-150s/70s-80s.    Current HTN meds:  hydralazine 50mg  TID (8am, 5-6pm, 10pm) Valsartan 320 mg daily (1230p) Furosemide 20mg  daily (8am) Carvedilol 12.5mg  BID (8am, 5-6pm)  Previously tried:HCTZ, amlodipine (coughing at higher doses)  BP goal: <140/55mmHggiven age   Wt Readings from Last 3 Encounters:  01/05/17 162 lb (73.5 kg)  12/30/16 163 lb 12.8 oz (74.3 kg)  12/26/16 165 lb 6.4 oz (75 kg)   BP Readings from Last 3 Encounters:  03/20/17 132/74  02/27/17 132/68  02/08/17 (!) 148/63   Pulse Readings from Last 3 Encounters:  03/20/17 79  02/27/17 73  02/08/17 60    Renal function: CrCl cannot be calculated (Patient's  most recent lab result is older than the maximum 21 days allowed.).  Past Medical History:  Diagnosis Date  . Anxiety   . Aortic valve sclerosis   . Atrial fibrillation ([place])    hx/notes 01/05/2017  . Basal cell carcinoma    hx; near eyes  . Bradycardia   . Chronic cough   . Dysrhythmia   . Edema of lower extremity   . GERD (gastroesophageal reflux disease)   . H/O one miscarriage   . Hard of hearing    has hearing aides but does not wear   . Heart murmur   . Hip pain    left  . History of bladder infections    08/2015  . History of blood transfusion    "when I had a miscarriage"  . History of hiatal hernia   . History of kidney stones   . Hyperlipidemia   . Hypertension   . Junctional bradycardia    hx/notes 01/05/2017  . Leg pain, left   . OA (osteoarthritis)    "right pointer; right hip" (01/05/2017)  . Obesity   . Pneumonia 1951  . Presence of permanent cardiac pacemaker   . Renal insufficiency   . SSS (sick sinus syndrome) ([place]) 01/05/2017   hx/notes 01/05/2017  . Tinnitus   . Urinary incontinence   . Varicose  veins   . Wears glasses     Current Outpatient Prescriptions on File Prior to Visit  Medication Sig Dispense Refill  . ALPRAZolam (XANAX) 0.5 MG tablet Take 0.25 mg by mouth at bedtime.     Marland Kitchen amiodarone (PACERONE) 200 MG tablet TAKE 1 TABLET(200 MG) BY MOUTH DAILY 30 tablet 2  . antiseptic oral rinse (BIOTENE) LIQD 15 mLs by Mouth Rinse route as needed for dry mouth.    . Biotin 5000 MCG TABS Take 5,000 mcg by mouth every other day.    . busPIRone (BUSPAR) 5 MG tablet Take 5 mg by mouth 2 (two) times daily.    . furosemide (LASIX) 20 MG tablet Take 1 tablet (20 mg total) by mouth daily. 90 tablet 3  . Magnesium 500 MG CAPS Take 500 mg by mouth daily as needed (leg cramps).    . Multiple Vitamin (MULTIVITAMIN) tablet Take 1 tablet by mouth daily.     . pantoprazole (PROTONIX) 40 MG tablet Take 40 mg by mouth daily.    . polyethylene glycol (MIRALAX /  GLYCOLAX) packet Take 17 g by mouth daily as needed for mild constipation, moderate constipation or severe constipation.    . Polyvinyl Alcohol-Povidone (REFRESH OP) Apply 1 drop to eye daily as needed (dry eyes).    . pyridOXINE (VITAMIN B-6) 100 MG tablet Take 100 mg by mouth every other day.    . Rivaroxaban (XARELTO) 15 MG TABS tablet Take 1 tablet (15 mg total) by mouth daily with lunch. Take next dose 01/10/17 42 tablet   . valsartan (DIOVAN) 320 MG tablet Take 1 tablet (320 mg total) by mouth daily. 90 tablet 3  . vitamin B-12 (CYANOCOBALAMIN) 500 MCG tablet Take 500 mcg by mouth every other day.    . zolpidem (AMBIEN) 5 MG tablet Take 5 mg by mouth daily as needed for sleep.     No current facility-administered medications on file prior to visit.     Allergies  Allergen Reactions  . Celebrex [Celecoxib] Other (See Comments)    Reflux  . Codeine Nausea Only  . Ace Inhibitors Cough  . Norvasc [Amlodipine Besylate] Cough    Higher doses = VERY BAD COUGHING   . Vioxx [Rofecoxib] Other (See Comments)    Pt unsure of reaction    Blood pressure 132/74, pulse 79.   Assessment/Plan: Hypertension: BP controlled today. In effort to keep her pressures more regulated and prevent her from dropping so quickly will decrease her hydralazine to 25mg  TID and increase carvedilol to 18.75mg  BID. Follow up in HTN clinic in 4-6 weeks.    Thank you, Lelan Pons. Patterson Hammersmith, [facility]  03/20/2017 3:11 PM

## 2017-03-20 NOTE — Telephone Encounter (Signed)
Per patient daughter Butch Penny want to switch from Dr. Curt Bears to Dr. Anastasia Pall - patient daughter would like for you to gave her a call to Thank you.

## 2017-03-23 DIAGNOSIS — I10 Essential (primary) hypertension: Secondary | ICD-10-CM | POA: Diagnosis not present

## 2017-03-23 NOTE — Telephone Encounter (Signed)
OK with me to switch.

## 2017-03-25 NOTE — Telephone Encounter (Signed)
Returned dtr's call.   Dtr just wanted to tell me thank you for all my help and all I have done for them.  "she truly is grateful for all I have done for mom". Informed that I would reach out to Dr. Caryl Comes for the transfer and we would soon call to arrange OV w/ him for sometime this summer.

## 2017-04-02 NOTE — Telephone Encounter (Signed)
Heather, Please review with Dr. Caryl Comes to see if he is ok with taking over care per pt request.  Let me know and we will arrange f/u w/ Caryl Comes once approved. Thanks

## 2017-04-02 NOTE — Telephone Encounter (Signed)
fine

## 2017-04-02 NOTE — Telephone Encounter (Signed)
Will forward to Dr. Klein to review. 

## 2017-04-03 ENCOUNTER — Telehealth: Payer: Self-pay | Admitting: Pharmacist

## 2017-04-03 NOTE — Telephone Encounter (Signed)
Follow Up: ° ° ° °Returning your call from yesterday. °

## 2017-04-03 NOTE — Telephone Encounter (Signed)
Lm asking Butch Penny, dtr, to call and speak with scheduler, Lenna Sciara to arrange OV w/ Caryl Comes.

## 2017-04-03 NOTE — Telephone Encounter (Signed)
Pt reports that her pressure this morning is 98/50 it has since come back up to 120s/60s and she feels much better now. She reports that her pressures have been better since her most recent OV, but still is still trending high in the early morning and dropping low after her morning doses of medications several days she has been (<100/50) after morning medications. She reports her pressures are controlled throughout the day ([id number] systolic). Will decrease her morning and midday dose of hydralazine to help with drops in pressures. Take hydralazine 12.5mg  in morning and midday dose and continue 25mg  each evening for high morning pressures before morning medications. Pt states understanding and appreciation.

## 2017-04-07 ENCOUNTER — Telehealth: Payer: Self-pay | Admitting: Interventional Cardiology

## 2017-04-07 NOTE — Telephone Encounter (Signed)
Spoke with patient and she confirmed below. She did skip dose last night and pressure this morning returned to 160s/60s this morning and she feels well. Advised she changed dose to 12.5mg  TID rather than keeping evening dose at 25mg . She will go to hydralazine 12.5mg  TID and keep BP f/u as scheduled. Advised she call if she has any additional episodes and call 911 if urgent/emergent episode. Pt states appreciation for follow up call and understanding of instructions.

## 2017-04-07 NOTE — Telephone Encounter (Signed)
   Patient called at 12:30 AM regarding her BP. THis morning, it was low.  She lied down and felt better and it came up.  At dinner time, BP was controlled.  At bed time, BP was 88/48, then 5 minutes later it was 93/54.    A few minutes ago, her BP is 100/55  Her hydralazine has been adjusted.  She is wondering whether she should take her hydralazine.  I advised her to not take any hydralazine right now as she is headed to bed.  She was in agreement.  SHe will check her BP in the morning and call the ofice if there are issues.

## 2017-04-14 ENCOUNTER — Encounter: Payer: Medicare Other | Admitting: Cardiology

## 2017-04-17 ENCOUNTER — Ambulatory Visit (INDEPENDENT_AMBULATORY_CARE_PROVIDER_SITE_OTHER): Payer: Medicare Other | Admitting: Internal Medicine

## 2017-04-17 VITALS — BP 120/64 | HR 68 | Ht 64.0 in | Wt 163.0 lb

## 2017-04-17 DIAGNOSIS — Z950 Presence of cardiac pacemaker: Secondary | ICD-10-CM

## 2017-04-17 DIAGNOSIS — R001 Bradycardia, unspecified: Secondary | ICD-10-CM | POA: Diagnosis not present

## 2017-04-17 DIAGNOSIS — I480 Paroxysmal atrial fibrillation: Secondary | ICD-10-CM

## 2017-04-17 MED ORDER — AMIODARONE HCL 100 MG PO TABS: 100.0000 mg | ORAL_TABLET | Freq: Every day | ORAL | 3 refills | Status: DC

## 2017-04-17 MED ORDER — AMIODARONE HCL 200 MG PO TABS: 100.0000 mg | ORAL_TABLET | Freq: Every day | ORAL | Status: DC

## 2017-04-17 NOTE — Addendum Note (Signed)
Addended byAlvis Lemmings on: 04/17/2017 05:42 PM   Modules accepted: Orders

## 2017-04-17 NOTE — Progress Notes (Signed)
Patient Care Team: Chesley Noon, MD as PCP - General (Family Medicine)   HPI  [name] is a 83 y.o. female Seen to establish pacemaker follow-up. She underwent device implantation by Dr. Carlyn Reichert 2/18 for symptomatic sinus node dysfunction. She also has paroxysmal atrial fibrillation for which she takes amiodarone and Rivaroxaban.  She wanted an older doctor.  She had atrial fibrillation cardioverted 9/17. She was not rapid at that time.  Date TSH LFTs Cr Hgb  1/18  1.14     14  5/18    1.63     She's feeling much improved since pacemaker implantation. She has not had a recurrent atrial fibrillation of which she is aware. There is no chest pain edema. She is having bleeding.     Records aThat is why found    Past Medical History:  Diagnosis Date  . Anxiety   . Aortic valve sclerosis   . Atrial fibrillation ([place])    hx/notes 01/05/2017  . Basal cell carcinoma    hx; near eyes  . Bradycardia   . Chronic cough   . Dysrhythmia   . Edema of lower extremity   . GERD (gastroesophageal reflux disease)   . H/O one miscarriage   . Hard of hearing    has hearing aides but does not wear   . Heart murmur   . Hip pain    left  . History of bladder infections    08/2015  . History of blood transfusion    "when I had a miscarriage"  . History of hiatal hernia   . History of kidney stones   . Hyperlipidemia   . Hypertension   . Junctional bradycardia    hx/notes 01/05/2017  . Leg pain, left   . OA (osteoarthritis)    "right pointer; right hip" (01/05/2017)  . Obesity   . Pneumonia 1951  . Presence of permanent cardiac pacemaker   . Renal insufficiency   . SSS (sick sinus syndrome) ([place]) 01/05/2017   hx/notes 01/05/2017  . Tinnitus   . Urinary incontinence   . Varicose veins   . Wears glasses     Past Surgical History:  Procedure Laterality Date  . BREAST CYST EXCISION Right 1977   "benign"  . CARDIOVASCULAR STRESS TEST  06/25/2006   EF 62%  .  CARDIOVERSION N/A 07/25/2016   Procedure: CARDIOVERSION;  Surgeon: Lelon Perla, MD;  Location: [facility] ENDOSCOPY;  Service: Cardiovascular;  Laterality: N/A;  . CATARACT EXTRACTION W/ INTRAOCULAR LENS  IMPLANT, BILATERAL Bilateral   . DILATION AND CURETTAGE OF UTERUS    . INSERT / REPLACE / REMOVE PACEMAKER  01/05/2017  . JOINT REPLACEMENT    . NASAL SEPTUM SURGERY    . OVARIAN CYST REMOVAL  2002  . PACEMAKER IMPLANT N/A 01/05/2017   Procedure: Pacemaker Implant;  Surgeon: Will Meredith Leeds, MD;  Location: [place] [facility];  Service: Cardiovascular;  Laterality: N/A;  . TEE WITHOUT CARDIOVERSION N/A 12/16/2016   Procedure: TRANSESOPHAGEAL ECHOCARDIOGRAM (TEE);  Surgeon: Larey Dresser, MD;  Location: [place];  Service: Cardiovascular;  Laterality: N/A;  . TONSILLECTOMY    . TOTAL HIP ARTHROPLASTY Left 12/18/2015   Procedure: LEFT TOTAL HIP ARTHROPLASTY ANTERIOR APPROACH;  Surgeon: Paralee Cancel, MD;  Location: [facility] ORS;  Service: Orthopedics;  Laterality: Left;    Current Outpatient Prescriptions  Medication Sig Dispense Refill  . ALPRAZolam (XANAX) 0.5 MG tablet Take 0.25 mg by mouth at bedtime.     Marland Kitchen  amiodarone (PACERONE) 200 MG tablet TAKE 1 TABLET(200 MG) BY MOUTH DAILY 30 tablet 2  . antiseptic oral rinse (BIOTENE) LIQD 15 mLs by Mouth Rinse route as needed for dry mouth.    . Biotin 5000 MCG TABS Take 5,000 mcg by mouth every other day.    . busPIRone (BUSPAR) 5 MG tablet Take 5 mg by mouth 2 (two) times daily.    . carvedilol (COREG) 12.5 MG tablet Take 1.5 tablets (18.75 mg total) by mouth 2 (two) times daily with a meal. 60 tablet 9  . furosemide (LASIX) 20 MG tablet Take 1 tablet (20 mg total) by mouth daily. 90 tablet 3  . hydrALAZINE (APRESOLINE) 25 MG tablet Take 0.5 tablet each morning and 0.5 tablet at midday and 0.5 tablet by mouth each evening. 180 tablet 3  . Magnesium 500 MG CAPS Take 500 mg by mouth daily as needed (leg cramps).    . Multiple Vitamin (MULTIVITAMIN)  tablet Take 1 tablet by mouth daily.     . polyethylene glycol (MIRALAX / GLYCOLAX) packet Take 17 g by mouth daily as needed for mild constipation, moderate constipation or severe constipation.    Marland Kitchen pyridOXINE (VITAMIN B-6) 100 MG tablet Take 100 mg by mouth every other day.    . Rivaroxaban (XARELTO) 15 MG TABS tablet Take 1 tablet (15 mg total) by mouth daily with lunch. Take next dose 01/10/17 42 tablet   . valsartan (DIOVAN) 320 MG tablet Take 1 tablet (320 mg total) by mouth daily. 90 tablet 3  . vitamin B-12 (CYANOCOBALAMIN) 500 MCG tablet Take 500 mcg by mouth every other day.     No current facility-administered medications for this visit.     Allergies  Allergen Reactions  . Celebrex [Celecoxib] Other (See Comments)    Reflux  . Codeine Nausea Only  . Ace Inhibitors Cough  . Norvasc [Amlodipine Besylate] Cough    Higher doses = VERY BAD COUGHING   . Vioxx [Rofecoxib] Other (See Comments)    Pt unsure of reaction      Review of Systems negative except from HPI and PMH  Physical Exam BP 120/64   Pulse 68   Ht 5\' 4"  (1.626 m)   Wt 163 lb (73.9 kg)   SpO2 98%   BMI 27.98 kg/m  Well developed and nourished in no acute distress HENT normal Neck supple with JVP-flat Carotids brisk and full without bruits Clear Device pocket well healed; without hematoma or erythema.  There is no tethering  Regular rate and rhythm, 2/6 M  murmurs or gallops Abd-soft with active BS without hepatomegaly/  No Clubbing cyanosis edema Skin-warm and dry A & Oriented  Grossly normal sensory and motor function  ECG demonstrates atrial pacing at 62 Intervals 40/12/46   Assessment and  Plan  Atrial fibrillation  Sinus node dysfunction  Pacemaker MRI Med[employer]onic    renal failure grade 4   1AVB    the patient has paroxysmal atrial fibrillation with 1 cardioversion. We will decrease the amiodarone from 200--100 mg a day. The one episode of atrial fibrillation was not associated with a  rapid rate although it was apparently still quite symptomatic.    we will maintain her on her other medications.  We will check her amiodarone surveillance laboratories today.  She is appropriately dosed on her Rivaroxaban given her renal dysfunction.  Her first degree AV block is likely aggravated by her amiodarone. PR interval 12/17 were less than 200 ms. This may improve with  decreasing amiodarone   Current medicines are reviewed at length with the patient today .  The patient does not  have concerns regarding medicines.

## 2017-04-17 NOTE — Patient Instructions (Addendum)
Medication Instructions: - Your physician has recommended you make the following change in your medication:  1) Decrease amiodarone 200 mg- take 1/2 tablet (100 mg) by mouth once daily  Labwork: - Your physician recommends that you have lab work today: TSH/Liver  Procedures/Testing: - none ordered  Follow-Up: - Your physician recommends that you schedule a follow-up appointment in: 3 months with Chanetta Marshall, NP for Dr. Caryl Comes.  - Your physician wants you to follow-up in: 6 months with Dr. Caryl Comes. You will receive a reminder letter in the mail two months in advance. If you don't receive a letter, please call our office to schedule the follow-up appointment.    Any Additional Special Instructions Will Be Listed Below (If Applicable).     If you need a refill on your cardiac medications before your next appointment, please call your pharmacy.

## 2017-04-20 ENCOUNTER — Other Ambulatory Visit: Payer: Self-pay | Admitting: *Deleted

## 2017-04-20 DIAGNOSIS — R748 Abnormal levels of other serum enzymes: Secondary | ICD-10-CM

## 2017-04-21 ENCOUNTER — Ambulatory Visit (INDEPENDENT_AMBULATORY_CARE_PROVIDER_SITE_OTHER): Payer: Medicare Other | Admitting: Pharmacist

## 2017-04-21 ENCOUNTER — Telehealth: Payer: Self-pay

## 2017-04-21 VITALS — BP 158/78 | HR 65

## 2017-04-21 DIAGNOSIS — I10 Essential (primary) hypertension: Secondary | ICD-10-CM

## 2017-04-21 MED ORDER — CARVEDILOL 12.5 MG PO TABS: 25.0000 mg | ORAL_TABLET | Freq: Two times a day (BID) | ORAL | 9 refills | Status: DC

## 2017-04-21 NOTE — Patient Instructions (Addendum)
Return for a follow up appointment in 3 weeks  Check your blood pressure at home daily (if able) and keep record of the readings.  Take your BP meds as follows: CONTINUE valsartan 320mg  daily, furosemide 20mg  daily INCREASE carvedilol to 25mg  (2 tablets) twice daily  STOP hydralazine   Bring all of your meds, your BP cuff and your record of home blood pressures to your next appointment.  Exercise as you're able, try to walk approximately 30 minutes per day.  Keep salt intake to a minimum, especially watch canned and prepared boxed foods.  Eat more fresh fruits and vegetables and fewer canned items.  Avoid eating in fast food restaurants.    HOW TO TAKE YOUR BLOOD PRESSURE: . Rest 5 minutes before taking your blood pressure. .  Don't smoke or drink caffeinated beverages for at least 30 minutes before. . Take your blood pressure before (not after) you eat. . Sit comfortably with your back supported and both feet on the floor (don't cross your legs). . Elevate your arm to heart level on a table or a desk. . Use the proper sized cuff. It should fit smoothly and snugly around your bare upper arm. There should be enough room to slip a fingertip under the cuff. The bottom edge of the cuff should be 1 inch above the crease of the elbow. . Ideally, take 3 measurements at one sitting and record the average.

## 2017-04-21 NOTE — Telephone Encounter (Signed)
**Note De-Identified Euretha Najarro Obfuscation** Received an approval on PA for Amiodarone 100 mg Kamdon Reisig fax from [facility]. Approval good from now until 04/21/2018.

## 2017-04-21 NOTE — Progress Notes (Signed)
Patient ID: [name]                 DOB: [birth year]                      MRN: [id number]     HPI: [name] is a 83 y.o. female patient of Dr. Caryl Comes to HTN clinic for follow up.PMH is significant for chronic afib and HTN.She was seen in clinic by Dr. Curt Bears on 12/22/16 at which time her pressure was elevated to 200/84 and she was sent to the ED. She was started on Cardura 2mg  QHS, but called the following day with lightheadedness and lower pressures (119/75mmHg). Her dose was cut to 1mg  daily. It was explained that those pressures were actually desirable. At f/u visit in HTN clinic, she noted that she had been feeling poorly and dizzy. Doxazosin was discontinued. She was maintained on several different doses of hydralazine and her pressures were tenuous and fluctuated significantly throughout the day. At her most recent visit with Dr. Caryl Comes her amiodarone was decreased. She was previously seen in HTN clinic and her carvedilol was increased with goal of weaning hydralazine. She since had lows and her hydralazine was decreased again to 12.5mg  TID.   She presents today with her cuff. She states she has been monitoring 4 times per day most days still to help [place] see what is going on. She reports very few "lows" or feeling poorly like her pressure is low since her hydralazine dose was decreased.   She states she does occasionally go into Afib per her cuff, she also states she can tell by feeling sometimes as well. She reports that it is short lived and overall she feels well.   Home measurements: Pressures since dose change to hydralazine - 140s-150s/60-80s in the am, 110s-130s/60-70s at lunch, 120s-140s/60-70s at dinner, 130-150s/70-80s at bedtime. HR have ranged 65-85.   Her cuff - 156/80 65HR   Current HTN meds:  hydralazine 12.5mg  TID (8am, 5-6pm, 10pm) Valsartan 320 mg daily (1230p) Furosemide 20mg  daily (8am) Carvedilol 18.75mg  BID (8am, 5-6pm)  Previously tried:HCTZ, amlodipine  (coughing at higher doses)  BP goal: <140/40mmHggiven age   Wt Readings from Last 3 Encounters:  04/17/17 163 lb (73.9 kg)  01/05/17 162 lb (73.5 kg)  12/30/16 163 lb 12.8 oz (74.3 kg)   BP Readings from Last 3 Encounters:  04/21/17 (!) 158/78  04/17/17 120/64  03/20/17 132/74   Pulse Readings from Last 3 Encounters:  04/21/17 65  04/17/17 68  03/20/17 79    Renal function: CrCl cannot be calculated (Patient's most recent lab result is older than the maximum 21 days allowed.).  Past Medical History:  Diagnosis Date  . Anxiety   . Aortic valve sclerosis   . Atrial fibrillation ([place])    hx/notes 01/05/2017  . Basal cell carcinoma    hx; near eyes  . Bradycardia   . Chronic cough   . Dysrhythmia   . Edema of lower extremity   . GERD (gastroesophageal reflux disease)   . H/O one miscarriage   . Hard of hearing    has hearing aides but does not wear   . Heart murmur   . Hip pain    left  . History of bladder infections    08/2015  . History of blood transfusion    "when I had a miscarriage"  . History of hiatal hernia   . History of kidney stones   .  Hyperlipidemia   . Hypertension   . Junctional bradycardia    hx/notes 01/05/2017  . Leg pain, left   . OA (osteoarthritis)    "right pointer; right hip" (01/05/2017)  . Obesity   . Pneumonia 1951  . Presence of permanent cardiac pacemaker   . Renal insufficiency   . SSS (sick sinus syndrome) ([place]) 01/05/2017   hx/notes 01/05/2017  . Tinnitus   . Urinary incontinence   . Varicose veins   . Wears glasses     Current Outpatient Prescriptions on File Prior to Visit  Medication Sig Dispense Refill  . ALPRAZolam (XANAX) 0.5 MG tablet Take 0.25 mg by mouth at bedtime.     Marland Kitchen amiodarone (PACERONE) 100 MG tablet Take 1 tablet (100 mg total) by mouth daily. 90 tablet 3  . antiseptic oral rinse (BIOTENE) LIQD 15 mLs by Mouth Rinse route as needed for dry mouth.    . Biotin 5000 MCG TABS Take 5,000 mcg by mouth every  other day.    . busPIRone (BUSPAR) 5 MG tablet Take 5 mg by mouth 2 (two) times daily.    . furosemide (LASIX) 20 MG tablet Take 1 tablet (20 mg total) by mouth daily. 90 tablet 3  . Magnesium 500 MG CAPS Take 500 mg by mouth daily as needed (leg cramps).    . Multiple Vitamin (MULTIVITAMIN) tablet Take 1 tablet by mouth daily.     . polyethylene glycol (MIRALAX / GLYCOLAX) packet Take 17 g by mouth daily as needed for mild constipation, moderate constipation or severe constipation.    Marland Kitchen pyridOXINE (VITAMIN B-6) 100 MG tablet Take 100 mg by mouth every other day.    . Rivaroxaban (XARELTO) 15 MG TABS tablet Take 1 tablet (15 mg total) by mouth daily with lunch. Take next dose 01/10/17 42 tablet   . valsartan (DIOVAN) 320 MG tablet Take 1 tablet (320 mg total) by mouth daily. 90 tablet 3  . vitamin B-12 (CYANOCOBALAMIN) 500 MCG tablet Take 500 mcg by mouth every other day.     No current facility-administered medications on file prior to visit.     Allergies  Allergen Reactions  . Celebrex [Celecoxib] Other (See Comments)    Reflux  . Codeine Nausea Only  . Ace Inhibitors Cough  . Norvasc [Amlodipine Besylate] Cough    Higher doses = VERY BAD COUGHING   . Vioxx [Rofecoxib] Other (See Comments)    Pt unsure of reaction    Blood pressure (!) 158/78, pulse 65.   Assessment/Plan: Hypertension: BP today is above goal, but she reports history of white coat hypertension. Her cuff measures appropriately and thus will treat based on home measurements. Will continue to try to wean hydralazine to help with stabilization of pressures. Will increase carvedilol to 25mg  BID and stop hydralazine. Advised if HR begins to run in low 60s or any HRs in 50s to call clinic. Follow up in HTN clinic in 3 weeks.    Thank you, Lelan Pons. Patterson Hammersmith, [facility]  04/21/2017 2:28 PM

## 2017-04-21 NOTE — Telephone Encounter (Signed)
PA for Amiodarone has been done through covermymeds. Awaiting response.

## 2017-04-22 LAB — TSH

## 2017-04-22 LAB — HEPATIC FUNCTION PANEL
ALBUMIN: 4.6 g/dL (ref 3.5–4.7)
ALK PHOS: 95 IU/L (ref 39–117)
ALT: 36 IU/L — ABNORMAL HIGH (ref 0–32)
AST: 54 IU/L — AB (ref 0–40)
Bilirubin Total: 0.4 mg/dL (ref 0.0–1.2)
Bilirubin, Direct: 0.14 mg/dL (ref 0.00–0.40)
Total Protein: 7.1 g/dL (ref 6.0–8.5)

## 2017-04-24 LAB — CUP PACEART INCLINIC DEVICE CHECK
Battery Remaining Longevity: 150 mo
Battery Voltage: 3.15 V
Brady Statistic AP VP Percent: 5.05 %
Brady Statistic AP VS Percent: 94.71 %
Brady Statistic AS VP Percent: 0.21 %
Brady Statistic AS VS Percent: 0.03 %
Brady Statistic RA Percent Paced: 99.72 %
Brady Statistic RV Percent Paced: 5.27 %
Date Time Interrogation Session: 20180608195056
Implantable Lead Implant Date: 20180226
Implantable Lead Implant Date: 20180226
Implantable Lead Location: 753859
Implantable Lead Location: 753860
Implantable Lead Model: 5076
Implantable Lead Model: 5076
Implantable Pulse Generator Implant Date: 20180226
Lead Channel Impedance Value: 285 Ohm
Lead Channel Impedance Value: 418 Ohm
Lead Channel Impedance Value: 494 Ohm
Lead Channel Impedance Value: 684 Ohm
Lead Channel Pacing Threshold Amplitude: 0.75 V
Lead Channel Pacing Threshold Amplitude: 0.75 V
Lead Channel Pacing Threshold Pulse Width: 0.4 ms
Lead Channel Pacing Threshold Pulse Width: 0.4 ms
Lead Channel Sensing Intrinsic Amplitude: 2 mV
Lead Channel Sensing Intrinsic Amplitude: 24.75 mV
Lead Channel Setting Pacing Amplitude: 2 V
Lead Channel Setting Pacing Amplitude: 2.5 V
Lead Channel Setting Pacing Pulse Width: 0.4 ms
Lead Channel Setting Sensing Sensitivity: 2.8 mV

## 2017-04-29 ENCOUNTER — Telehealth: Payer: Self-pay | Admitting: Pharmacist

## 2017-04-29 NOTE — Telephone Encounter (Signed)
Pt called pharmacy clinic with BP fluctuations. Reports readings over the past few days of: 160/87, 145/76, 128/76, 138/70, 143/80, 12/65. This morning, reports she was tired and BP was 99/49. She laid down and rechecked it later - BP had increased to 118/59. Pt does have afib so accuracy of home cuff may be questionable - pt states she felt that she was in afib when she got the low BP reading. Advised pt to continue taking current meds, check her BP at home, and to keep f/u appt.

## 2017-05-10 ENCOUNTER — Telehealth: Payer: Self-pay | Admitting: Internal Medicine

## 2017-05-10 NOTE — Telephone Encounter (Signed)
Had an episode of bp of 80s. Told her to cut back on the coreg to 12.5 mg bid and miss her coreg tonight. She will take her bp readings to her clinic on Friday. Her latest sbp was 113. She is now feeling better.

## 2017-05-15 ENCOUNTER — Ambulatory Visit (INDEPENDENT_AMBULATORY_CARE_PROVIDER_SITE_OTHER): Payer: Medicare Other | Admitting: Pharmacist

## 2017-05-15 VITALS — BP 132/68 | HR 67

## 2017-05-15 DIAGNOSIS — I10 Essential (primary) hypertension: Secondary | ICD-10-CM | POA: Diagnosis not present

## 2017-05-15 MED ORDER — CARVEDILOL 12.5 MG PO TABS: 12.5000 mg | ORAL_TABLET | Freq: Two times a day (BID) | ORAL | 9 refills | Status: DC

## 2017-05-15 NOTE — Patient Instructions (Signed)
Return for a follow up appointment in 4-6 weeks  Check your blood pressure at home daily (if able) and keep record of the readings.  Take your BP meds as follows: Continue carvedilol 12.5mg  twice daily, valsartan 320mg  daily, furosemide 20mg  daily    Bring all of your meds, your BP cuff and your record of home blood pressures to your next appointment.  Exercise as you're able, try to walk approximately 30 minutes per day.  Keep salt intake to a minimum, especially watch canned and prepared boxed foods.  Eat more fresh fruits and vegetables and fewer canned items.  Avoid eating in fast food restaurants.    HOW TO TAKE YOUR BLOOD PRESSURE: . Rest 5 minutes before taking your blood pressure. .  Don't smoke or drink caffeinated beverages for at least 30 minutes before. . Take your blood pressure before (not after) you eat. . Sit comfortably with your back supported and both feet on the floor (don't cross your legs). . Elevate your arm to heart level on a table or a desk. . Use the proper sized cuff. It should fit smoothly and snugly around your bare upper arm. There should be enough room to slip a fingertip under the cuff. The bottom edge of the cuff should be 1 inch above the crease of the elbow. . Ideally, take 3 measurements at one sitting and record the average.

## 2017-05-15 NOTE — Progress Notes (Signed)
Patient ID: [name]                 DOB: 04-16-1930                      MRN: [id number]     HPI: [name] is a 83 y.o. female patient of Dr. Caryl Comes to HTN clinic for follow up.PMH is significant for chronic afib and HTN.She was seen in clinic by Dr. Curt Bears on 12/22/16 at which time her pressure was elevated to 200/84 and she was sent to the ED. She was started on Cardura 2mg  QHS, but called the following day with lightheadedness and lower pressures (119/10mmHg). Her dose was cut to 1mg  daily. It was explained that those pressures were actually desirable. At f/u visit in HTN clinic, she noted that she had been feeling poorly and dizzy. Doxazosin was discontinued. She was maintained on several different doses of hydralazine and her pressures were tenuous and fluctuated significantly throughout the day. At her most recent visit with Dr. Caryl Comes her amiodarone was decreased. She was previously seen in HTN clinic and her carvedilol was increased with goal of weaning hydralazine. At her most recent visit her hydralazine was stopped and her carvedilol was increased for added BP control. She has since called in with one low measurement with which she was feeling rather poorly - though she noted that she may have also been in Afib.   She presents today with her daughter. She has home measurements with her as well. They have been much improved since decreasing to carvedilol 12.5mg  BID. She reports she has not had any lows since then. She states that she has felt better overall since stopping hydralazine. Her pressures have also been more consistent.   Home measurements: since decreasing to carvedilol 12.5mg  BID - 120s-140s/70s with one or two measurements in 149F systolic on a day she was noted to be under more stress than usual.   Current HTN meds:  Valsartan 320 mg daily (1230p) Furosemide 20mg  daily (8am) Carvedilol 12.5mg  BID (8am, 5-6pm)  Previously tried:HCTZ, amlodipine (coughing at  higher doses)  BP goal: <140/35mmHggiven age   Wt Readings from Last 3 Encounters:  04/17/17 163 lb (73.9 kg)  01/05/17 162 lb (73.5 kg)  12/30/16 163 lb 12.8 oz (74.3 kg)   BP Readings from Last 3 Encounters:  05/15/17 132/68  04/21/17 (!) 158/78  04/17/17 120/64   Pulse Readings from Last 3 Encounters:  05/15/17 67  04/21/17 65  04/17/17 68    Renal function: CrCl cannot be calculated (Patient's most recent lab result is older than the maximum 21 days allowed.).  Past Medical History:  Diagnosis Date  . Anxiety   . Aortic valve sclerosis   . Atrial fibrillation ([place])    hx/notes 01/05/2017  . Basal cell carcinoma    hx; near eyes  . Bradycardia   . Chronic cough   . Dysrhythmia   . Edema of lower extremity   . GERD (gastroesophageal reflux disease)   . H/O one miscarriage   . Hard of hearing    has hearing aides but does not wear   . Heart murmur   . Hip pain    left  . History of bladder infections    08/2015  . History of blood transfusion    "when I had a miscarriage"  . History of hiatal hernia   . History of kidney stones   . Hyperlipidemia   .  Hypertension   . Junctional bradycardia    hx/notes 01/05/2017  . Leg pain, left   . OA (osteoarthritis)    "right pointer; right hip" (01/05/2017)  . Obesity   . Pneumonia 1951  . Presence of permanent cardiac pacemaker   . Renal insufficiency   . SSS (sick sinus syndrome) ([place]) 01/05/2017   hx/notes 01/05/2017  . Tinnitus   . Urinary incontinence   . Varicose veins   . Wears glasses     Current Outpatient Prescriptions on File Prior to Visit  Medication Sig Dispense Refill  . ALPRAZolam (XANAX) 0.5 MG tablet Take 0.25 mg by mouth at bedtime.     Marland Kitchen amiodarone (PACERONE) 100 MG tablet Take 1 tablet (100 mg total) by mouth daily. 90 tablet 3  . antiseptic oral rinse (BIOTENE) LIQD 15 mLs by Mouth Rinse route as needed for dry mouth.    . Biotin 5000 MCG TABS Take 5,000 mcg by mouth every other day.     . busPIRone (BUSPAR) 5 MG tablet Take 5 mg by mouth 2 (two) times daily.    . furosemide (LASIX) 20 MG tablet Take 1 tablet (20 mg total) by mouth daily. 90 tablet 3  . Magnesium 500 MG CAPS Take 500 mg by mouth daily as needed (leg cramps).    . Multiple Vitamin (MULTIVITAMIN) tablet Take 1 tablet by mouth daily.     . polyethylene glycol (MIRALAX / GLYCOLAX) packet Take 17 g by mouth daily as needed for mild constipation, moderate constipation or severe constipation.    Marland Kitchen pyridOXINE (VITAMIN B-6) 100 MG tablet Take 100 mg by mouth every other day.    . Rivaroxaban (XARELTO) 15 MG TABS tablet Take 1 tablet (15 mg total) by mouth daily with lunch. Take next dose 01/10/17 42 tablet   . valsartan (DIOVAN) 320 MG tablet Take 1 tablet (320 mg total) by mouth daily. 90 tablet 3  . vitamin B-12 (CYANOCOBALAMIN) 500 MCG tablet Take 500 mcg by mouth every other day.     No current facility-administered medications on file prior to visit.     Allergies  Allergen Reactions  . Celebrex [Celecoxib] Other (See Comments)    Reflux  . Codeine Nausea Only  . Ace Inhibitors Cough  . Norvasc [Amlodipine Besylate] Cough    Higher doses = VERY BAD COUGHING   . Vioxx [Rofecoxib] Other (See Comments)    Pt unsure of reaction    Blood pressure 132/68, pulse 67, SpO2 99 %.   Assessment/Plan: Hypertension: BP today is controlled and improved on current regimen with minimal side effects (compared to previous). No medication changes. Again had a lengthy discussion on blood pressure and pathophysiology. She would prefer to continue to follow with HTN clinic for some time to ensure she is managed appropriately for her BP. She will follow up in 4 weeks.    Thank you, Lelan Pons. Patterson Hammersmith, [place]  05/15/2017 4:52 PM

## 2017-05-24 ENCOUNTER — Other Ambulatory Visit: Payer: Self-pay | Admitting: Physician Assistant

## 2017-05-25 NOTE — Telephone Encounter (Signed)
Pt last saw Dr Caryl Comes 04/17/17, last labs 02/08/17 Creat 1.63, weight 73.9kg, age 83, CrCl 29.97, based on CrCl pt is on appropriate dosage of Xarelto 15mg  QD.  Will refill rx

## 2017-06-03 DIAGNOSIS — M7062 Trochanteric bursitis, left hip: Secondary | ICD-10-CM | POA: Diagnosis not present

## 2017-06-03 DIAGNOSIS — Z96642 Presence of left artificial hip joint: Secondary | ICD-10-CM | POA: Diagnosis not present

## 2017-06-03 DIAGNOSIS — Z471 Aftercare following joint replacement surgery: Secondary | ICD-10-CM | POA: Diagnosis not present

## 2017-06-03 DIAGNOSIS — M25552 Pain in left hip: Secondary | ICD-10-CM | POA: Diagnosis not present

## 2017-06-05 ENCOUNTER — Telehealth: Payer: Self-pay | Admitting: Pharmacist

## 2017-06-05 MED ORDER — IRBESARTAN 300 MG PO TABS: 300.0000 mg | ORAL_TABLET | Freq: Every day | ORAL | 3 refills | Status: DC

## 2017-06-05 NOTE — Telephone Encounter (Signed)
Pt called clinic regarding valsartan recall - she was notified her supply was affected. Will switch valsartan 320mg  daily to irbesartan 300mg  daily. Called patient and she states she has been taking valsartan-HCTZ 320-25mg  daily. Pt is not supposed to be taking this, her rx was switched to just valsartan in February by Dr Curt Bears and a note was even specified on the prescription sent in that pt was stopping the HCTZ component because he started her on Lasix. Pt has been taking both Lasix and HCTZ. Advised her she was not supposed to be taking 2 fluid pills. She will pick up her irbesartan 300mg  daily and keep f/u in HTN clinic on 8/10. Also called [facility] pharmacy to notify them of their medication error. They stated pt did pick up her valsartan in February but they autofilled the combination pill in April. Advised them to cancel this from her medication profile.

## 2017-06-12 ENCOUNTER — Other Ambulatory Visit: Payer: Medicare Other | Admitting: *Deleted

## 2017-06-12 DIAGNOSIS — R748 Abnormal levels of other serum enzymes: Secondary | ICD-10-CM | POA: Diagnosis not present

## 2017-06-12 LAB — BASIC METABOLIC PANEL
BUN / CREAT RATIO: 22 (ref 12–28)
BUN: 33 mg/dL — AB (ref 8–27)
CHLORIDE: 95 mmol/L — AB (ref 96–106)
CO2: 26 mmol/L (ref 20–29)
Calcium: 9.9 mg/dL (ref 8.7–10.3)
Creatinine, Ser: 1.47 mg/dL — ABNORMAL HIGH (ref 0.57–1.00)
GFR calc Af Amer: 38 mL/min/{1.73_m2} — ABNORMAL LOW (ref >59)
GFR calc non Af Amer: 33 mL/min/{1.73_m2} — ABNORMAL LOW (ref >59)
GLUCOSE: 95 mg/dL (ref 65–99)
Potassium: 4.5 mmol/L (ref 3.5–5.2)
Sodium: 137 mmol/L (ref 134–144)

## 2017-06-12 LAB — HEPATIC FUNCTION PANEL
ALT: 21 IU/L (ref 0–32)
AST: 26 IU/L (ref 0–40)
Albumin: 4.3 g/dL (ref 3.5–4.7)
Alkaline Phosphatase: 75 IU/L (ref 39–117)
Bilirubin Total: 0.9 mg/dL (ref 0.0–1.2)
Bilirubin, Direct: 0.29 mg/dL (ref 0.00–0.40)
TOTAL PROTEIN: 6.7 g/dL (ref 6.0–8.5)

## 2017-06-12 NOTE — Addendum Note (Signed)
Addended by: Eulis Foster on: 06/12/2017 10:30 AM   Modules accepted: Orders

## 2017-06-19 ENCOUNTER — Ambulatory Visit (INDEPENDENT_AMBULATORY_CARE_PROVIDER_SITE_OTHER): Payer: Medicare Other | Admitting: Pharmacist

## 2017-06-19 VITALS — BP 166/82 | HR 70

## 2017-06-19 DIAGNOSIS — I10 Essential (primary) hypertension: Secondary | ICD-10-CM | POA: Diagnosis not present

## 2017-06-19 NOTE — Patient Instructions (Addendum)
It was good seeing you today!  Your blood pressure is above your goal today. Because your home readings look good, we will continue your current blood pressure medication regimen.   The only change we will make is move the evening dose of carvedilol 12.5 mg closer to 8 PM to help your blood pressure be more consistent.   Continue to check your blood pressure at home and keeping a log of the readings.  If you notice your blood pressure increasing or decreasing persistently or if you notice increased dizziness, lightheadedness, or headaches, please contact the clinic at [phone].   Follow up with Chanetta Marshall as scheduled on 07/23/17. Follow up with the blood pressure clinic as needed.

## 2017-06-19 NOTE — Progress Notes (Signed)
Patient ID: [name]                 DOB: Aug 29, 1930                      MRN: [id number]     HPI: [name] is a 83 y.o. female patient of Dr. Caryl Roberson to HTN clinic for follow up.PMH is significant for chronic afib and HTN.She was seen in clinic by Dr. Curt Bears on 12/22/16 at which time her pressure was elevated to 200/84 and she was sent to the ED. She was started on Cardura 2mg  QHS, but called the following day with lightheadedness and lower pressures (119/70mmHg). Her dose was cut to 1mg  daily. It was explained that those pressures were actually desirable. At f/u visit in HTN clinic, she noted that she had been feeling poorly and dizzy. Doxazosin was discontinued. She was maintained on several different doses of hydralazine and her pressures were tenuous and fluctuated significantly throughout the day. At her most recent visit with Dr. Caryl Roberson her amiodarone was decreased. She was previously seen in HTN clinic and her carvedilol was increased with goal of weaning hydralazine. Since this her hydralazine has been stopped completely and her carvedilol reduced back to 12.5mg  BID due to side effects. At her most recent OV in HTN clinic no medication changes were made, but her supply of valsartan was affected by recall and thus she was changed to irbesartan 300mg  daily.   Pt presents to clinic today in good spirits. Pt was supposed to be taking valsartan, but she has been taking a valsartan-hctz combination tablet up until July 27th when it was changed to irbesartan 300 mg daily due to recall. Pt states that she has been tolerating irbesartan well. Denies experiencing dizziness, lightheadedness, headaches, or falls. Pt checks BP at home. Since July 27th, her readings have been elevated in the mornings before her medications but drops throughout the day as she takes her meds to 110s-140s/60s-80s, with occasionally higher values. HR in clinic today is 37. BP in clinic is 166/82 mmHg. Pt notes that she gets  a little anxious at office visits and has been running errands prior to the visit.   Current HTN meds:  Irbesartan 300mg  daily (1230p) Furosemide 20mg  daily (8am) Carvedilol 12.5mg  BID (8am, 5-6pm)  Previously tried:HCTZ, amlodipine (coughing at higher doses), doxazosin (dizziness), hydralazine (BP not optimized), clonidine (side effects)   BP goal: <140/35mmHggiven age  Family History: Mother - diabetes. Father - heart disease. Sister - hypertension. Brother - hypertension  Social History: No alcohol, no tobacco, or illicit drug use   Home BP readings: From the past month, SBP ranged from 110s - 150s  usually, with some values in the 160s and occasionally in the 170s. DBP ranges from 60s to 70s usually, with some values in the 80s and occasionally in the 90s.   Wt Readings from Last 3 Encounters:  04/17/17 163 lb (73.9 kg)  01/05/17 162 lb (73.5 kg)  12/30/16 163 lb 12.8 oz (74.3 kg)   BP Readings from Last 3 Encounters:  06/19/17 (!) 166/82  05/15/17 132/68  04/21/17 (!) 158/78   Pulse Readings from Last 3 Encounters:  06/19/17 70  05/15/17 67  04/21/17 65    Renal function: CrCl cannot be calculated (Unknown ideal weight.).  Past Medical History:  Diagnosis Date  . Anxiety   . Aortic valve sclerosis   . Atrial fibrillation ([place])    hx/notes 01/05/2017  .  Basal cell carcinoma    hx; near eyes  . Bradycardia   . Chronic cough   . Dysrhythmia   . Edema of lower extremity   . GERD (gastroesophageal reflux disease)   . H/O one miscarriage   . Hard of hearing    has hearing aides but does not wear   . Heart murmur   . Hip pain    left  . History of bladder infections    08/2015  . History of blood transfusion    "when I had a miscarriage"  . History of hiatal hernia   . History of kidney stones   . Hyperlipidemia   . Hypertension   . Junctional bradycardia    hx/notes 01/05/2017  . Leg pain, left   . OA (osteoarthritis)    "right pointer; right hip"  (01/05/2017)  . Obesity   . Pneumonia 1951  . Presence of permanent cardiac pacemaker   . Renal insufficiency   . SSS (sick sinus syndrome) ([place]) 01/05/2017   hx/notes 01/05/2017  . Tinnitus   . Urinary incontinence   . Varicose veins   . Wears glasses     Current Outpatient Prescriptions on File Prior to Visit  Medication Sig Dispense Refill  . ALPRAZolam (XANAX) 0.5 MG tablet Take 0.25 mg by mouth at bedtime.     Marland Kitchen amiodarone (PACERONE) 100 MG tablet Take 1 tablet (100 mg total) by mouth daily. 90 tablet 3  . antiseptic oral rinse (BIOTENE) LIQD 15 mLs by Mouth Rinse route as needed for dry mouth.    . Biotin 5000 MCG TABS Take 5,000 mcg by mouth every other day.    . busPIRone (BUSPAR) 5 MG tablet Take 5 mg by mouth 2 (two) times daily.    . carvedilol (COREG) 12.5 MG tablet Take 1 tablet (12.5 mg total) by mouth 2 (two) times daily with a meal. 60 tablet 9  . furosemide (LASIX) 20 MG tablet Take 1 tablet (20 mg total) by mouth daily. 90 tablet 3  . irbesartan (AVAPRO) 300 MG tablet Take 1 tablet (300 mg total) by mouth daily. 90 tablet 3  . Magnesium 500 MG CAPS Take 500 mg by mouth daily as needed (leg cramps).    . Multiple Vitamin (MULTIVITAMIN) tablet Take 1 tablet by mouth daily.     . polyethylene glycol (MIRALAX / GLYCOLAX) packet Take 17 g by mouth daily as needed for mild constipation, moderate constipation or severe constipation.    Marland Kitchen pyridOXINE (VITAMIN B-6) 100 MG tablet Take 100 mg by mouth every other day.    . vitamin B-12 (CYANOCOBALAMIN) 500 MCG tablet Take 500 mcg by mouth every other day.    Alveda Reasons 15 MG TABS tablet TAKE 1 TABLET(15 MG) BY MOUTH DAILY WITH SUPPER 30 tablet 10   No current facility-administered medications on file prior to visit.     Allergies  Allergen Reactions  . Celebrex [Celecoxib] Other (See Comments)    Reflux  . Codeine Nausea Only  . Ace Inhibitors Cough  . Norvasc [Amlodipine Besylate] Cough    Higher doses = VERY BAD  COUGHING   . Vioxx [Rofecoxib] Other (See Comments)    Pt unsure of reaction    Blood pressure (!) 166/82, pulse 70.   Assessment/Plan:  1. Hypertension: Pt's BP today was above her goal of <140/90 mmHg which may be attributed to white coat hypertension and aspects of home life. Pt's home BP readings look good since she has started irbesartan  300 mg. Will continue current antihypertensive regimen. Instructed pt to take her evening carvedilol 12.5 mg dose closer to 8 PM to help lower her BP more in the mornings. Pt is scheduled to see Chanetta Marshall on 07/23/17. F/u with hypertension clinic as needed after that visit.   - Barkley Boards, PharmD Student   Thank you, Lelan Pons. Patterson Hammersmith, [facility]  06/19/2017 3:06 PM

## 2017-06-22 ENCOUNTER — Telehealth: Payer: Self-pay

## 2017-06-22 NOTE — Telephone Encounter (Signed)
Pt is aware and agreeable to normal drug surveillance labs. She is aware of upcoming appt.

## 2017-06-24 DIAGNOSIS — K219 Gastro-esophageal reflux disease without esophagitis: Secondary | ICD-10-CM | POA: Diagnosis not present

## 2017-06-24 DIAGNOSIS — I482 Chronic atrial fibrillation: Secondary | ICD-10-CM | POA: Diagnosis not present

## 2017-06-24 DIAGNOSIS — I10 Essential (primary) hypertension: Secondary | ICD-10-CM | POA: Diagnosis not present

## 2017-06-24 DIAGNOSIS — M25552 Pain in left hip: Secondary | ICD-10-CM | POA: Diagnosis not present

## 2017-07-16 DIAGNOSIS — M25552 Pain in left hip: Secondary | ICD-10-CM | POA: Diagnosis not present

## 2017-07-16 DIAGNOSIS — M7062 Trochanteric bursitis, left hip: Secondary | ICD-10-CM | POA: Diagnosis not present

## 2017-07-16 DIAGNOSIS — Z471 Aftercare following joint replacement surgery: Secondary | ICD-10-CM | POA: Diagnosis not present

## 2017-07-16 DIAGNOSIS — Z96642 Presence of left artificial hip joint: Secondary | ICD-10-CM | POA: Diagnosis not present

## 2017-07-20 ENCOUNTER — Ambulatory Visit (INDEPENDENT_AMBULATORY_CARE_PROVIDER_SITE_OTHER): Payer: Medicare Other | Admitting: *Deleted

## 2017-07-20 ENCOUNTER — Telehealth: Payer: Self-pay | Admitting: Cardiology

## 2017-07-20 DIAGNOSIS — I495 Sick sinus syndrome: Secondary | ICD-10-CM | POA: Diagnosis not present

## 2017-07-20 NOTE — Telephone Encounter (Signed)
Spoke with pt and reminded pt of remote transmission that is due today. Pt verbalized understanding.   

## 2017-07-21 LAB — CUP PACEART REMOTE DEVICE CHECK
Battery Remaining Longevity: 146 mo
Brady Statistic AP VP Percent: 17.6 %
Brady Statistic AS VP Percent: 0.32 %
Brady Statistic RA Percent Paced: 99.32 %
Date Time Interrogation Session: 20180910183839
Implantable Lead Implant Date: 20180226
Implantable Lead Location: 753860
Implantable Lead Model: 5076
Implantable Lead Model: 5076
Lead Channel Impedance Value: 304 Ohm
Lead Channel Pacing Threshold Amplitude: 1.25 V
Lead Channel Pacing Threshold Pulse Width: 0.4 ms
Lead Channel Pacing Threshold Pulse Width: 0.4 ms
Lead Channel Sensing Intrinsic Amplitude: 14.5 mV
Lead Channel Sensing Intrinsic Amplitude: 4.5 mV
Lead Channel Sensing Intrinsic Amplitude: 4.5 mV
MDC IDC LEAD IMPLANT DT: 20180226
MDC IDC LEAD LOCATION: 753859
MDC IDC MSMT BATTERY VOLTAGE: 3.12 V
MDC IDC MSMT LEADCHNL RA IMPEDANCE VALUE: 437 Ohm
MDC IDC MSMT LEADCHNL RV IMPEDANCE VALUE: 532 Ohm
MDC IDC MSMT LEADCHNL RV IMPEDANCE VALUE: 646 Ohm
MDC IDC MSMT LEADCHNL RV PACING THRESHOLD AMPLITUDE: 0.75 V
MDC IDC MSMT LEADCHNL RV SENSING INTR AMPL: 14.5 mV
MDC IDC PG IMPLANT DT: 20180226
MDC IDC SET LEADCHNL RA PACING AMPLITUDE: 2 V
MDC IDC SET LEADCHNL RV PACING AMPLITUDE: 2.5 V
MDC IDC SET LEADCHNL RV PACING PULSEWIDTH: 0.4 ms
MDC IDC SET LEADCHNL RV SENSING SENSITIVITY: 2.8 mV
MDC IDC STAT BRADY AP VS PERCENT: 82.05 %
MDC IDC STAT BRADY AS VS PERCENT: 0.03 %
MDC IDC STAT BRADY RV PERCENT PACED: 18.07 %

## 2017-07-21 NOTE — Progress Notes (Signed)
Remote pacemaker transmission.   

## 2017-07-22 ENCOUNTER — Encounter: Payer: Self-pay | Admitting: Cardiology

## 2017-07-22 NOTE — Progress Notes (Signed)
Electrophysiology Office Note Date: 07/23/2017  ID:  [name], DOB 05/27/1930, MRN [id number]  PCP: Chesley Noon, MD Electrophysiologist: Caryl Comes  CC: Pacemaker and AF follow-up  [name] is a 83 y.o. female seen today for Dr Caryl Comes.  She presents today for routine electrophysiology followup.  Since last being seen in our clinic, the patient reports doing reasonably well.  She continues to have stressors at home with caring for her husband. Her BP has been elevated recently. She has had some AF but burden is low.  She denies chest pain, palpitations, dyspnea, PND, orthopnea, nausea, vomiting, dizziness, syncope, edema, weight gain, or early satiety.   Device History: MDT dual chamber PPM implanted 2018 for SSS   Past Medical History:  Diagnosis Date  . Anxiety   . Aortic valve sclerosis   . Atrial fibrillation ([place])   . Basal cell carcinoma    hx; near eyes  . GERD (gastroesophageal reflux disease)   . H/O one miscarriage   . Hard of hearing    has hearing aides but does not wear   . History of hiatal hernia   . History of kidney stones   . Hyperlipidemia   . Hypertension   . OA (osteoarthritis)    "right pointer; right hip" (01/05/2017)  . Obesity   . Renal insufficiency   . SSS (sick sinus syndrome) ([place]) 01/05/2017   a. s/p MDT dual chamber PPM   . Tinnitus   . Varicose veins   . Wears glasses    Past Surgical History:  Procedure Laterality Date  . BREAST CYST EXCISION Right 1977   "benign"  . CARDIOVERSION N/A 07/25/2016   Procedure: CARDIOVERSION;  Surgeon: Lelon Perla, MD;  Location: [facility];  Service: Cardiovascular;  Laterality: N/A;  . CATARACT EXTRACTION W/ INTRAOCULAR LENS  IMPLANT, BILATERAL Bilateral   . DILATION AND CURETTAGE OF UTERUS    . NASAL SEPTUM SURGERY    . OVARIAN CYST REMOVAL  2002  . PACEMAKER IMPLANT N/A 01/05/2017   Procedure: Pacemaker Implant;  Surgeon: Will Meredith Leeds, MD;  Location: [place] [facility];   Service: Cardiovascular;  Laterality: N/A;  . TEE WITHOUT CARDIOVERSION N/A 12/16/2016   Procedure: TRANSESOPHAGEAL ECHOCARDIOGRAM (TEE);  Surgeon: Larey Dresser, MD;  Location: [place];  Service: Cardiovascular;  Laterality: N/A;  . TONSILLECTOMY    . TOTAL HIP ARTHROPLASTY Left 12/18/2015   Procedure: LEFT TOTAL HIP ARTHROPLASTY ANTERIOR APPROACH;  Surgeon: Paralee Cancel, MD;  Location: [facility];  Service: Orthopedics;  Laterality: Left;    Current Outpatient Prescriptions  Medication Sig Dispense Refill  . ALPRAZolam (XANAX) 0.5 MG tablet Take 0.25 mg by mouth at bedtime.     Marland Kitchen amiodarone (PACERONE) 100 MG tablet Take 1 tablet (100 mg total) by mouth daily. 90 tablet 3  . antiseptic oral rinse (BIOTENE) LIQD 15 mLs by Mouth Rinse route as needed for dry mouth.    . Biotin 5000 MCG TABS Take 5,000 mcg by mouth every other day.    . busPIRone (BUSPAR) 5 MG tablet Take 5 mg by mouth 2 (two) times daily.    . carvedilol (COREG) 12.5 MG tablet Take 1.5 tablets (18.75 mg total) by mouth 2 (two) times daily with a meal. 90 tablet 3  . irbesartan (AVAPRO) 300 MG tablet Take 1 tablet (300 mg total) by mouth daily. 90 tablet 3  . Magnesium 500 MG CAPS Take 500 mg by mouth daily as needed (leg cramps).    Marland Kitchen  Multiple Vitamin (MULTIVITAMIN) tablet Take 1 tablet by mouth daily.     . polyethylene glycol (MIRALAX / GLYCOLAX) packet Take 17 g by mouth daily as needed for mild constipation, moderate constipation or severe constipation.    Marland Kitchen pyridOXINE (VITAMIN B-6) 100 MG tablet Take 100 mg by mouth every other day.    . vitamin B-12 (CYANOCOBALAMIN) 500 MCG tablet Take 500 mcg by mouth every other day.    Alveda Reasons 15 MG TABS tablet TAKE 1 TABLET(15 MG) BY MOUTH DAILY WITH SUPPER 30 tablet 10  . furosemide (LASIX) 20 MG tablet Take 1 tablet (20 mg total) by mouth daily. 90 tablet 3   No current facility-administered medications for this visit.     Allergies:   Celebrex [celecoxib]; Codeine; Ace  inhibitors; Norvasc [amlodipine besylate]; and Vioxx [rofecoxib]   Social History: Social History   Social History  . Marital status: Married    Spouse name: N/A  . Number of children: N/A  . Years of education: N/A   Occupational History  . Not on file.   Social History Main Topics  . Smoking status: Never Smoker  . Smokeless tobacco: Never Used  . Alcohol use No  . Drug use: No  . Sexual activity: Not Currently   Other Topics Concern  . Not on file   Social History Narrative  . No narrative on file    Family History: Family History  Problem Relation Age of Onset  . Diabetes Mother   . Heart disease Father   . Hypertension Sister   . Hypertension Brother      Review of Systems: All other systems reviewed and are otherwise negative except as noted above.   Physical Exam: VS:  BP (!) 190/100   Pulse 72   Ht 5\' 4"  (1.626 m)   Wt 165 lb (74.8 kg)   SpO2 96%   BMI 28.32 kg/m  , BMI Body mass index is 28.32 kg/m.  GEN- The patient is elderly appearing, alert and oriented x 3 today.   HEENT: normocephalic, atraumatic; sclera clear, conjunctiva pink; hearing intact; oropharynx clear; neck supple  Lungs- Clear to ausculation bilaterally, normal work of breathing.  No wheezes, rales, rhonchi Heart- Regular rate and rhythm  GI- soft, non-tender, non-distended, bowel sounds present  Extremities- no clubbing, cyanosis, or edema  MS- no significant deformity or atrophy Skin- warm and dry, no rash or lesion; PPM pocket well healed Psych- euthymic mood, full affect Neuro- strength and sensation are intact  PPM Interrogation- reviewed in detail today,  See PACEART report  EKG:  EKG is not ordered today.  Recent Labs: 02/08/2017: Hemoglobin 11.8; Platelets 184 04/17/2017: TSH CANCELED 06/12/2017: ALT 21; BUN 33; Creatinine, Ser 1.47; Potassium 4.5; Sodium 137   Wt Readings from Last 3 Encounters:  07/23/17 165 lb (74.8 kg)  04/17/17 163 lb (73.9 kg)  01/05/17 162  lb (73.5 kg)     Other studies Reviewed: Additional studies/ records that were reviewed today include: Dr Olin Pia office notes  Assessment and Plan:  1.  Sick sinus syndrome Normal PPM function See Pace Art report No changes today  2.  Paroxysmal atrial fibrillation Burden by device interrogation 0.2% V rates controlled Continue Xarelto for CHADS2VASC of 4 (renally dosed) Continue low dose amiodarone - consider discontinuing in follow up if no recurrent AF  3.  HTN BP recheck by me 182/78 Increase Coreg to 18.75mg  twice daily Follow up in hypertension clinic    Current medicines are reviewed  at length with the patient today.   The patient does not have concerns regarding her medicines.  The following changes were made today:  Increase coreg to 18.75mg  twice daily  Labs/ tests ordered today include: none No orders of the defined types were placed in this encounter.    Disposition:   Follow up with [facility], Dr Caryl Comes 6 months     Signed, Chanetta Marshall, NP 07/23/2017 12:21 PM  [address] [place] [phone] (office) [phone] (fax)

## 2017-07-23 ENCOUNTER — Ambulatory Visit (INDEPENDENT_AMBULATORY_CARE_PROVIDER_SITE_OTHER): Payer: Medicare Other | Admitting: Nurse Practitioner

## 2017-07-23 ENCOUNTER — Encounter: Payer: Self-pay | Admitting: Nurse Practitioner

## 2017-07-23 VITALS — BP 190/100 | HR 72 | Ht 64.0 in | Wt 165.0 lb

## 2017-07-23 DIAGNOSIS — I495 Sick sinus syndrome: Secondary | ICD-10-CM

## 2017-07-23 DIAGNOSIS — I10 Essential (primary) hypertension: Secondary | ICD-10-CM

## 2017-07-23 DIAGNOSIS — I480 Paroxysmal atrial fibrillation: Secondary | ICD-10-CM

## 2017-07-23 MED ORDER — CARVEDILOL 12.5 MG PO TABS: 18.7500 mg | ORAL_TABLET | Freq: Two times a day (BID) | ORAL | 3 refills | Status: DC

## 2017-07-23 NOTE — Patient Instructions (Addendum)
Medication Instructions:   START TAKING CARVEDILOL 18.75  MG DAILY   ( A TABLET AND HALF TWICE A DAY OF 12.5 MG )  If you need a refill on your cardiac medications before your next appointment, please call your pharmacy.  Labwork: NONE ORDERED  TODAY    Testing/Procedures: NONE ORDERED  TODAY    Follow-Up:  WITH [facility] PH[facility] D IN HTN CLINIC   IN 6 TO 8 WEEKS     Your physician wants you to follow-up in:  IN  [place] will receive a reminder letter in the mail two months in advance. If you don't receive a letter, please call our office to schedule the follow-up appointment.   Remote monitoring is used to monitor your Pacemaker of ICD from home. This monitoring reduces the number of office visits required to check your device to one time per year. It allows [place] to keep an eye on the functioning of your device to ensure it is working properly. You are scheduled for a device check from home on..12-10-18You may send your transmission at any time that day. If you have a wireless device, the transmission will be sent automatically. After your physician reviews your transmission, you will receive a postcard with your next transmission date.     Any Other Special Instructions Will Be Listed Below (If Applicable).

## 2017-08-04 DIAGNOSIS — Z23 Encounter for immunization: Secondary | ICD-10-CM | POA: Diagnosis not present

## 2017-08-05 ENCOUNTER — Encounter: Payer: Self-pay | Admitting: Cardiology

## 2017-08-12 DIAGNOSIS — I10 Essential (primary) hypertension: Secondary | ICD-10-CM | POA: Diagnosis not present

## 2017-08-12 DIAGNOSIS — I482 Chronic atrial fibrillation: Secondary | ICD-10-CM | POA: Diagnosis not present

## 2017-08-12 DIAGNOSIS — Z0000 Encounter for general adult medical examination without abnormal findings: Secondary | ICD-10-CM | POA: Diagnosis not present

## 2017-08-12 DIAGNOSIS — Z23 Encounter for immunization: Secondary | ICD-10-CM | POA: Diagnosis not present

## 2017-08-13 DIAGNOSIS — Z1231 Encounter for screening mammogram for malignant neoplasm of breast: Secondary | ICD-10-CM | POA: Diagnosis not present

## 2017-08-17 LAB — CUP PACEART INCLINIC DEVICE CHECK
Implantable Lead Location: 753859
Implantable Lead Model: 5076
Implantable Lead Model: 5076
MDC IDC LEAD IMPLANT DT: 20180226
MDC IDC LEAD IMPLANT DT: 20180226
MDC IDC LEAD LOCATION: 753860
MDC IDC PG IMPLANT DT: 20180226
MDC IDC SESS DTM: 20181008074831

## 2017-09-02 IMAGING — CR DG CHEST 2V
2 series · 2 of 2 positions shown · non-contrast
Comparison: Chest radiographs 03/25/2005.

CLINICAL DATA: 86-year-old female status post pacemaker placement
yesterday. Initial encounter.

EXAM:
CHEST  2 VIEW

[chest pa]
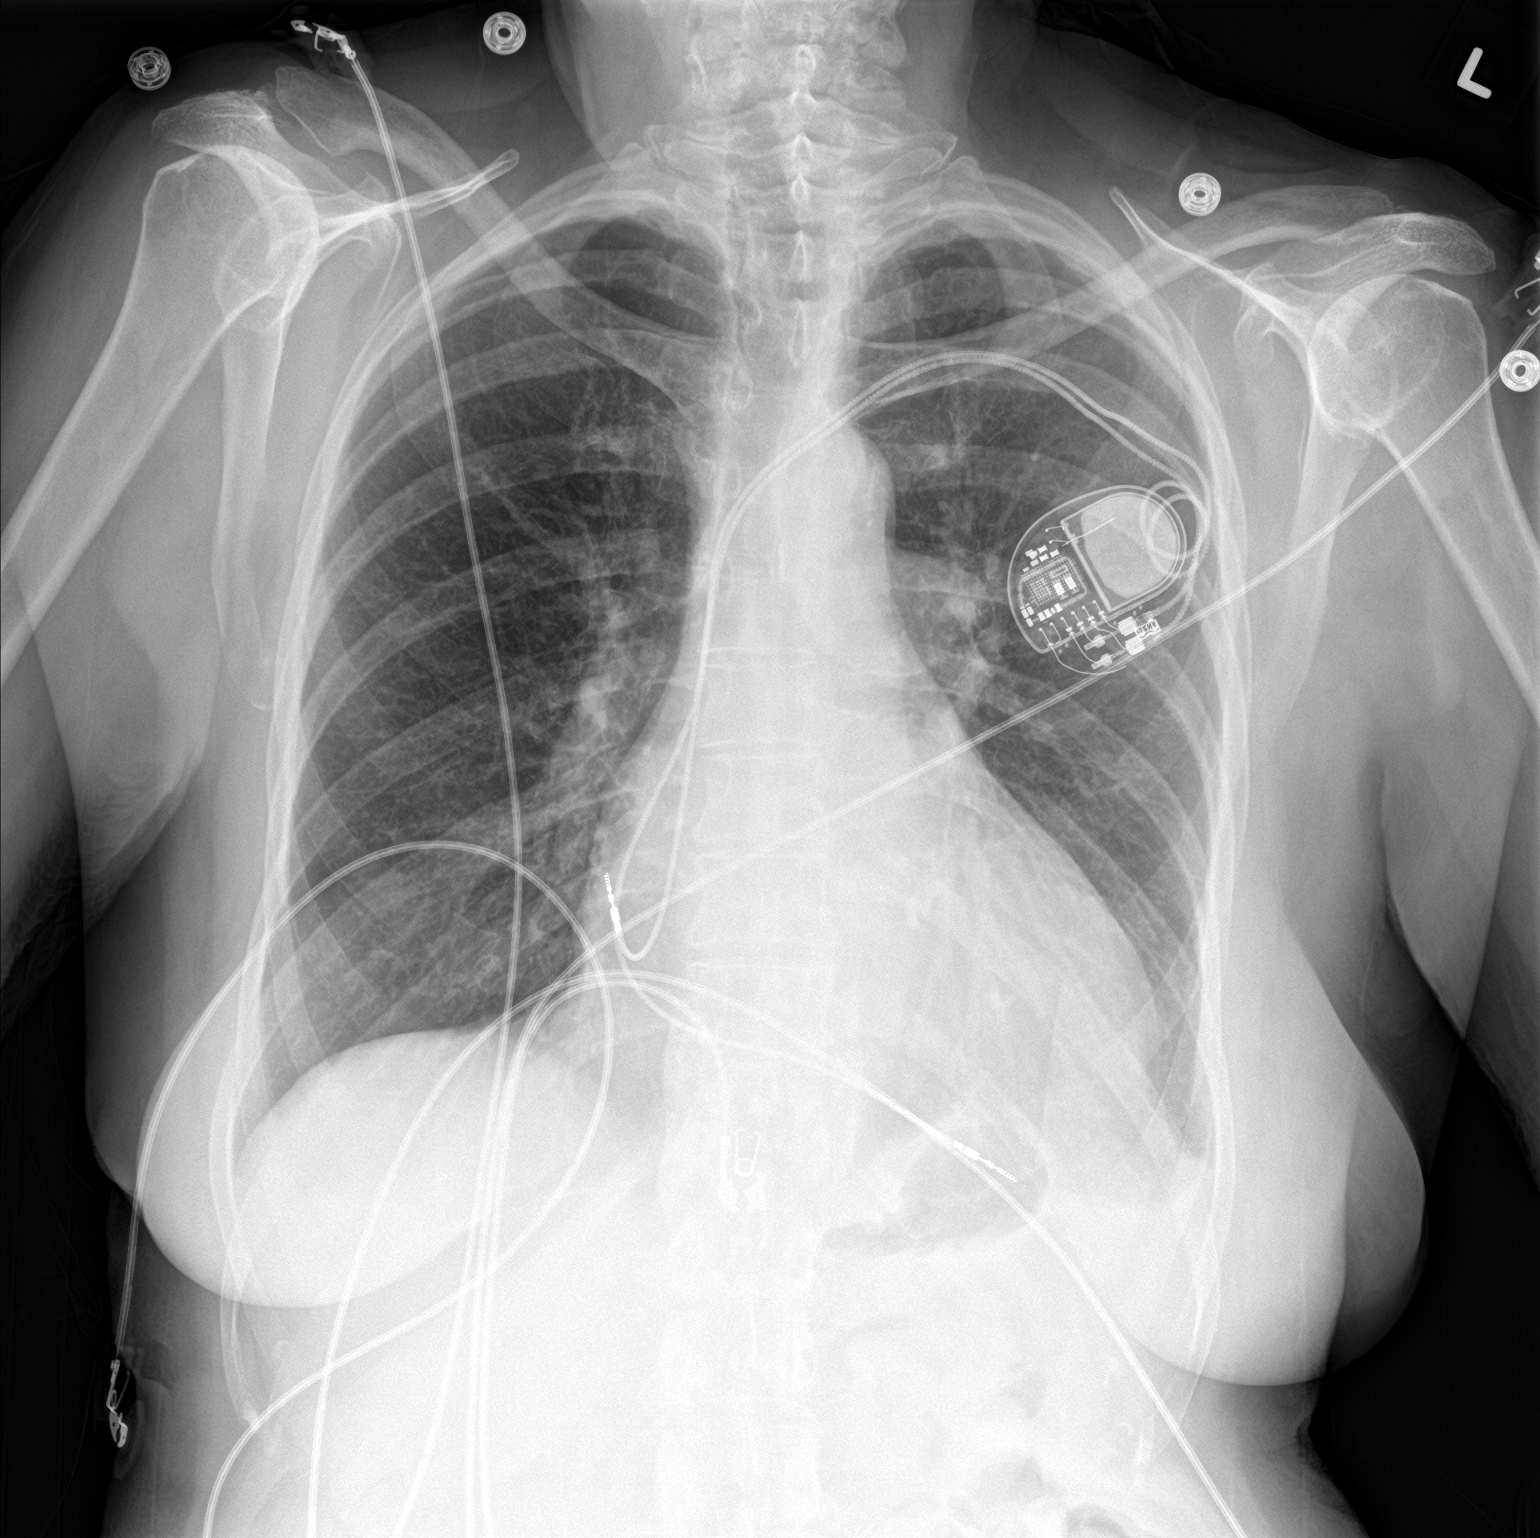

[chest lat]
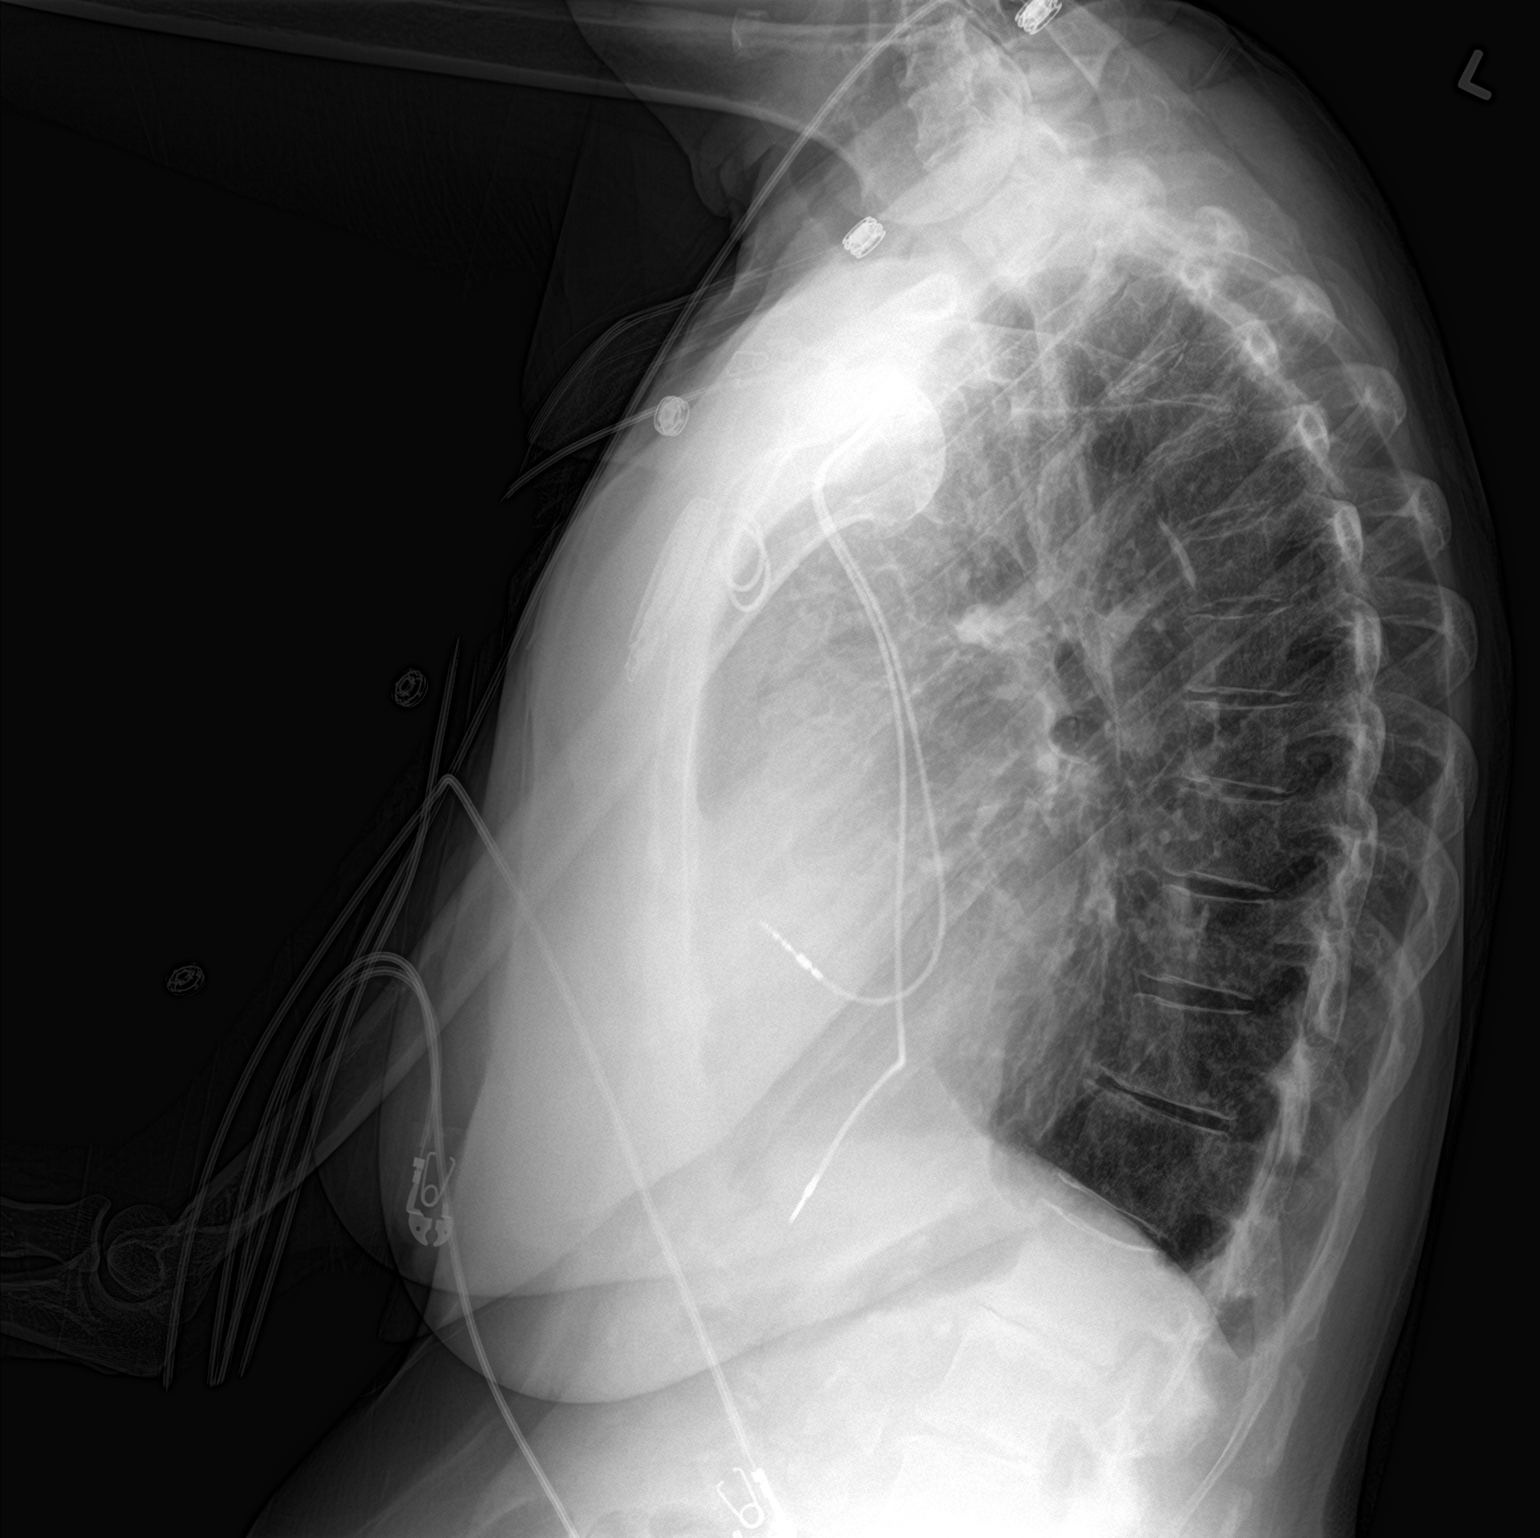

[2 of 2 positions shown; findings below may reference images not displayed]

FINDINGS: Left chest dual lead transvenous cardiac pacemaker in place. Leads
course to the RV apex and right atrium regions and appear intact. No
pneumothorax.

There is cardiomegaly. Other mediastinal contours are within normal
limits. Trace left pleural effusion suspected. No pulmonary edema.
The right lung is clear. Calcified aortic atherosclerosis. No acute
osseous abnormality identified. Negative visible bowel gas pattern.
IMPRESSION: 1. Left chest dual lead transvenous cardiac pacemaker placed. No
pneumothorax or adverse features.
2. Trace left pleural effusion suspected. No other acute
cardiopulmonary abnormality.
3. Cardiomegaly.  Calcified aortic atherosclerosis.

## 2017-09-04 ENCOUNTER — Ambulatory Visit (INDEPENDENT_AMBULATORY_CARE_PROVIDER_SITE_OTHER): Payer: Medicare Other | Admitting: Pharmacist

## 2017-09-04 ENCOUNTER — Encounter: Payer: Self-pay | Admitting: Pharmacist

## 2017-09-04 VITALS — BP 146/84 | HR 67

## 2017-09-04 DIAGNOSIS — I10 Essential (primary) hypertension: Secondary | ICD-10-CM | POA: Diagnosis not present

## 2017-09-04 NOTE — Progress Notes (Signed)
Patient ID: [name]                 DOB: 02-03-1930                      MRN: [id number]     HPI: [name] is a 83 y.o. female patient of Dr. Caryl Comes to HTN clinic for follow up.PMH is significant for chronic afib and HTN.She was seen in clinic by Dr. Curt Bears on 12/22/16 at which time her pressure was elevated to 200/84 and she was sent to the ED. She was started on Cardura 2mg  QHS, but called the following day with lightheadedness and lower pressures (119/60mmHg). Her dose was cut to 1mg  daily. It was explained that those pressures were actually desirable. At f/u visit in HTN clinic, she noted that she had been feeling poorly and dizzy. Doxazosin was discontinued. She was maintained on several different doses of hydralazine and her pressures were tenuous and fluctuated significantly throughout the day. She was previously seen in HTN clinic and her carvedilol was increased with goal of weaning hydralazine. Since this her hydralazine has been stopped completely and her carvedilol reduced back to 12.5mg  BID due to side effects. At her most recent OV in HTN clinic no medication changes were made, but her supply of valsartan was affected by recall and thus she was changed to irbesartan 300mg  daily. Pt was seen by Chanetta Marshall 1 month ago and BP was elevated at 190/100. Her carvedilol was increased to 18.75mg  BID and pt presents today for follow up.  Pt reports feeling well overall. She has been checking her BP 2-4 times per day, usually before and 2 hours after she takes her medications in the morning and evening. She has been adjusting her carvedilol dose. If her systolic BP is > 144, she has been taking 25mg  of carvedilol, otherwise she has been taking 18.75mg  (mostly taking this dose). Her BP responds well to the carvedilol and most readings after her medications range 818-563J systolic. She still has some readings that are elevated prior to taking medications - mostly in the 150-160s. She has  identified that stress causes her BP to increase. She is the caregiver for her husband who had to go without his medications for 24 hours during the recent hurricane which added a lot of stress.   She feels the best when her systolic BP is in the 497W-263Z. She occasionally gets headaches if her BP increases to the upper 160s-170s. Due to her hx of elevated BP, she feels dizzy when her systolic reading is in the low 130s or lower. No recent dizziness or falls. She states over the past few weeks she has been feeling more like herself again.  Her BP at home this AM was 139/69; clinic reading was 146/84.  Current HTN meds: Irbesartan 300 mg daily (12:30pm) Furosemide 20mg  daily (8am) Carvedilol 18.75mg  BID (8am, 8pm)  Previously tried:HCTZ (stopped when pt started furosemide), amlodipine (coughing at higher doses), hydralazine (BP fluctuated)  Diet: Does not add salt to food. Occasionally drinks a cup of coffee or decaf tea (~1 per week). Mostly drinks water.   Exercise: Works in the garden  BP goal: <150/41mmHggiven age and dizziness when systolic BP is in the 858I or lower  Wt Readings from Last 3 Encounters:  07/23/17 165 lb (74.8 kg)  04/17/17 163 lb (73.9 kg)  01/05/17 162 lb (73.5 kg)   BP Readings from Last 3 Encounters:  07/23/17 Marland Kitchen)  190/100  06/19/17 (!) 166/82  05/15/17 132/68   Pulse Readings from Last 3 Encounters:  07/23/17 72  06/19/17 70  05/15/17 67    Renal function: CrCl cannot be calculated (Patient's most recent lab result is older than the maximum 21 days allowed.).  Past Medical History:  Diagnosis Date  . Anxiety   . Aortic valve sclerosis   . Atrial fibrillation ([place])   . Basal cell carcinoma    hx; near eyes  . GERD (gastroesophageal reflux disease)   . H/O one miscarriage   . Hard of hearing    has hearing aides but does not wear   . History of hiatal hernia   . History of kidney stones   . Hyperlipidemia   . Hypertension   . OA  (osteoarthritis)    "right pointer; right hip" (01/05/2017)  . Obesity   . Renal insufficiency   . SSS (sick sinus syndrome) ([place]) 01/05/2017   a. s/p MDT dual chamber PPM   . Tinnitus   . Varicose veins   . Wears glasses     Current Outpatient Prescriptions on File Prior to Visit  Medication Sig Dispense Refill  . ALPRAZolam (XANAX) 0.5 MG tablet Take 0.25 mg by mouth at bedtime.     Marland Kitchen amiodarone (PACERONE) 100 MG tablet Take 1 tablet (100 mg total) by mouth daily. 90 tablet 3  . antiseptic oral rinse (BIOTENE) LIQD 15 mLs by Mouth Rinse route as needed for dry mouth.    . Biotin 5000 MCG TABS Take 5,000 mcg by mouth every other day.    . busPIRone (BUSPAR) 5 MG tablet Take 5 mg by mouth 2 (two) times daily.    . carvedilol (COREG) 12.5 MG tablet Take 1.5 tablets (18.75 mg total) by mouth 2 (two) times daily with a meal. 90 tablet 3  . furosemide (LASIX) 20 MG tablet Take 1 tablet (20 mg total) by mouth daily. 90 tablet 3  . irbesartan (AVAPRO) 300 MG tablet Take 1 tablet (300 mg total) by mouth daily. 90 tablet 3  . Magnesium 500 MG CAPS Take 500 mg by mouth daily as needed (leg cramps).    . Multiple Vitamin (MULTIVITAMIN) tablet Take 1 tablet by mouth daily.     . polyethylene glycol (MIRALAX / GLYCOLAX) packet Take 17 g by mouth daily as needed for mild constipation, moderate constipation or severe constipation.    Marland Kitchen pyridOXINE (VITAMIN B-6) 100 MG tablet Take 100 mg by mouth every other day.    . vitamin B-12 (CYANOCOBALAMIN) 500 MCG tablet Take 500 mcg by mouth every other day.    Alveda Reasons 15 MG TABS tablet TAKE 1 TABLET(15 MG) BY MOUTH DAILY WITH SUPPER 30 tablet 10   No current facility-administered medications on file prior to visit.     Allergies  Allergen Reactions  . Celebrex [Celecoxib] Other (See Comments)    Reflux  . Codeine Nausea Only  . Ace Inhibitors Cough  . Norvasc [Amlodipine Besylate] Cough    Higher doses = VERY BAD COUGHING   . Vioxx [Rofecoxib]  Other (See Comments)    Pt unsure of reaction     Assessment/Plan:  1. Hypertension - Will change BP goal to <150/79mmHg due to patient's age and dizziness when her systolic BP is in the low 130s or lower. She has been adjusting her carvedilol dose and taking 25mg  dose if her systolic BP is > 409, otherwise she takes 18.75mg . Her BP readings have overall improved and  pt's energy level is back to normal. Will continue irbesartan 300mg  daily, furosemide 20mg  daily, and carvedilol 18.75mg  BID with pt instructions to take 25mg  if systolic BP is > 136. Pt advised to call clinic with any concerns. F/u in HTN clinic as needed.   Megan E. Supple, PharmD, CPP, [facility] [address] [place], [place] [address] Phone: [phone]; Fax: [phone] 09/04/2017 12:07 PM

## 2017-09-04 NOTE — Patient Instructions (Signed)
It was nice to see you today  Continue taking your current medications  Call clinic with any concerns

## 2017-09-28 ENCOUNTER — Telehealth: Payer: Self-pay | Admitting: Internal Medicine

## 2017-09-28 MED ORDER — CARVEDILOL 25 MG PO TABS: 25.0000 mg | ORAL_TABLET | Freq: Two times a day (BID) | ORAL | 11 refills | Status: DC

## 2017-09-28 NOTE — Telephone Encounter (Signed)
New message   Patient states she has been taking 4 pills a day.  Her prescription is not covering the entire month.   *STAT* If patient is at the pharmacy, call can be transferred to refill team.   1. Which medications need to be refilled? (please list name of each medication and dose if known) carvedilol (COREG) 12.5 MG tablet 2. Which pharmacy/location (including street and city if local pharmacy) is medication to be sent to? [facility]  3. Do they need a 30 day or 90 day supply? [place]

## 2017-09-28 NOTE — Telephone Encounter (Signed)
Pt calling stating that she takes 4 tablets of carvedilol 12.5 mg daily. Jinny Blossom stated on office visit 09/04/17, Will continue irbesartan 300mg  daily, furosemide 20mg  daily, and carvedilol 18.75mg  BID with pt instructions to take 25mg  if systolic BP is > 675. Pt advised to call clinic with any concerns. F/u in HTN clinic as needed. Pt's Rx does not state that. Please advise

## 2017-09-28 NOTE — Telephone Encounter (Signed)
At last visit pt was taking carvedilol 18.75mg  BID unless SBP > 170. Called pt and she states she increased her dose to 25mg  BID and has been feeling ok with BP readings 639-432W systolic and HR in the 03L (also has a pacemaker). Will send in refill for higher dose of carvedilol 25mg  BID.

## 2017-10-19 ENCOUNTER — Ambulatory Visit (INDEPENDENT_AMBULATORY_CARE_PROVIDER_SITE_OTHER): Payer: Medicare Other | Admitting: *Deleted

## 2017-10-19 DIAGNOSIS — I495 Sick sinus syndrome: Secondary | ICD-10-CM | POA: Diagnosis not present

## 2017-10-19 NOTE — Progress Notes (Signed)
Remote pacemaker transmission.   

## 2017-10-21 ENCOUNTER — Ambulatory Visit: Payer: Medicare Other | Admitting: Internal Medicine

## 2017-10-21 ENCOUNTER — Encounter: Payer: Self-pay | Admitting: Internal Medicine

## 2017-10-21 VITALS — BP 162/74 | HR 78 | Resp 16 | Ht 64.0 in | Wt 171.0 lb

## 2017-10-21 DIAGNOSIS — Z950 Presence of cardiac pacemaker: Secondary | ICD-10-CM | POA: Diagnosis not present

## 2017-10-21 DIAGNOSIS — I480 Paroxysmal atrial fibrillation: Secondary | ICD-10-CM

## 2017-10-21 DIAGNOSIS — Z79899 Other long term (current) drug therapy: Secondary | ICD-10-CM

## 2017-10-21 DIAGNOSIS — I495 Sick sinus syndrome: Secondary | ICD-10-CM

## 2017-10-21 DIAGNOSIS — R001 Bradycardia, unspecified: Secondary | ICD-10-CM

## 2017-10-21 MED ORDER — HYDRALAZINE HCL 25 MG PO TABS: 25.0000 mg | ORAL_TABLET | Freq: Three times a day (TID) | ORAL | 3 refills | Status: DC

## 2017-10-21 MED ORDER — HYDRALAZINE HCL 25 MG PO TABS
25.0000 mg | ORAL_TABLET | Freq: Two times a day (BID) | ORAL | 3 refills | Status: DC
Start: 2017-10-21 — End: 2017-11-24

## 2017-10-21 NOTE — Progress Notes (Signed)
Patient Care Team: Chesley Noon, MD as PCP - General (Family Medicine)   HPI  [name] is a 83 y.o. female Seen with CC of  pacemaker follow-up with MR  She underwent device implantation by Dr. Carlyn Reichert 2/18 for symptomatic sinus node dysfunction. She also has paroxysmal atrial fibrillation for which she takes amiodarone and Rivaroxaban.  She wanted an older doctor.  She had atrial fibrillation cardioverted 9/17. She was not rapid at that time.  She denies intercurrent afib   Has signficant stress which contributes to BP   Home BP are much improved 220+>>160s  One episode of hypotension   No edema;  Some doe  No PND  Patient denies symptoms of GI intolerance, sun sensitivity, neurological symptoms attributable to amiodarone.  Surveillance laboratories were in normal limits when checked 10 months  ago   No obvious bleeding   Date TSH LFTs Cr Hgb  1/18  1.14     14  5/18    1.63   8/18 - 21 1.47 11.8 (4/18)            DATE TEST    2/18 Echo    EF 60-65 % Eccentric MR              Stress related to caring for husband who is chronically ill-- she is exhausted in this      Past Medical History:  Diagnosis Date  . Anxiety   . Aortic valve sclerosis   . Atrial fibrillation ([place])   . Basal cell carcinoma    hx; near eyes  . GERD (gastroesophageal reflux disease)   . H/O one miscarriage   . Hard of hearing    has hearing aides but does not wear   . History of hiatal hernia   . History of kidney stones   . Hyperlipidemia   . Hypertension   . OA (osteoarthritis)    "right pointer; right hip" (01/05/2017)  . Obesity   . Renal insufficiency   . SSS (sick sinus syndrome) ([place]) 01/05/2017   a. s/p MDT dual chamber PPM   . Tinnitus   . Varicose veins   . Wears glasses     Past Surgical History:  Procedure Laterality Date  . BREAST CYST EXCISION Right 1977   "benign"  . CARDIOVERSION N/A 07/25/2016   Procedure: CARDIOVERSION;  Surgeon: Lelon Perla, MD;  [facility] END[facility];  Service: Cardiovascular;  Laterality: N/A;  . CATARACT EXTRACTION W/ INTRAOCULAR LENS  IMPLANT, BILATERAL Bilateral   . DILATION AND CURETTAGE OF UTERUS    . NASAL SEPTUM SURGERY    . OVARIAN CYST REMOVAL  2002  . PACEMAKER IMPLANT N/A 01/05/2017   Procedure: Pacemaker Implant;  Surgeon: Will Meredith Leeds, MD;  Location: [facility] CV LAB;  Service: Cardiovascular;  Laterality: N/A;  . TEE WITHOUT CARDIOVERSION N/A 12/16/2016   Procedure: TRANSESOPHAGEAL ECHOCARDIOGRAM (TEE);  Surgeon: Larey Dresser, MD;  Location: [facility];  Service: Cardiovascular;  Laterality: N/A;  . TONSILLECTOMY    . TOTAL HIP ARTHROPLASTY Left 12/18/2015   Procedure: LEFT TOTAL HIP ARTHROPLASTY ANTERIOR APPROACH;  Surgeon: Paralee Cancel, MD;  Location: [facility] ORS;  Service: Orthopedics;  Laterality: Left;    Current Outpatient Medications  Medication Sig Dispense Refill  . ALPRAZolam (XANAX) 0.5 MG tablet Take 0.25 mg by mouth at bedtime.     Marland Kitchen amiodarone (PACERONE) 100 MG tablet Take 1 tablet (100 mg total) by mouth daily. 90 tablet  3  . antiseptic oral rinse (BIOTENE) LIQD 15 mLs by Mouth Rinse route as needed for dry mouth.    . Biotin 5000 MCG TABS Take 5,000 mcg by mouth every other day.    . busPIRone (BUSPAR) 5 MG tablet Take 5 mg by mouth 2 (two) times daily.    . carvedilol (COREG) 25 MG tablet Take 1 tablet (25 mg total) 2 (two) times daily with a meal by mouth. 60 tablet 11  . furosemide (LASIX) 20 MG tablet Take 1 tablet (20 mg total) by mouth daily. 90 tablet 3  . irbesartan (AVAPRO) 300 MG tablet Take 1 tablet (300 mg total) by mouth daily. 90 tablet 3  . Magnesium 500 MG CAPS Take 500 mg by mouth daily as needed (leg cramps).    . Multiple Vitamin (MULTIVITAMIN) tablet Take 1 tablet by mouth daily.     . polyethylene glycol (MIRALAX / GLYCOLAX) packet Take 17 g by mouth daily as needed for mild constipation, moderate constipation or severe constipation.    Marland Kitchen  pyridOXINE (VITAMIN B-6) 100 MG tablet Take 100 mg by mouth every other day.    . vitamin B-12 (CYANOCOBALAMIN) 500 MCG tablet Take 500 mcg by mouth every other day.    Alveda Reasons 15 MG TABS tablet TAKE 1 TABLET(15 MG) BY MOUTH DAILY WITH SUPPER 30 tablet 10   No current facility-administered medications for this visit.     Allergies  Allergen Reactions  . Celebrex [Celecoxib] Other (See Comments)    Reflux  . Codeine Nausea Only  . Ace Inhibitors Cough  . Norvasc [Amlodipine Besylate] Cough    Higher doses = VERY BAD COUGHING   . Vioxx [Rofecoxib] Other (See Comments)    Pt unsure of reaction      Review of Systems negative except from HPI and PMH  Physical Exam BP (!) 162/74   Pulse 78   Resp 16   Ht 5\' 4"  (1.626 m)   Wt 171 lb (77.6 kg)   SpO2 93%   BMI 29.35 kg/m  Well developed and nourished in no acute distress HENT normal Neck supple with JVP-flat Clear\ Device pocket well healed; without hematoma or erythema.  There is no tethering  Regular rate and rhythm,3/6 M apex Abd-soft with active BS No Clubbing cyanosis edema Skin-warm and dry A & Oriented  Grossly normal sensory and motor function   ECG to assess rhythm and pacemaker function 430/12/44   Assessment and  Plan  Atrial fibrillation  Sinus node dysfunction  Pacemaker MRI Medtronic The patient's device was interrogated.  The information was reviewed. No changes were made in the programming.     renal failure grade 4   MR moderate  1AVB-profound   High Risk Medication Surveillance  Stressful home situation   Euvolemic continue current meds  MR is evident, and might have impact on our ability to shorten the AV delay.  It is profoundly long >400 msec, and as such, we would expect improved hemodynamics with shortening of the AV delay, but this can also aggravate her Moderate MR So will undertake reprogramming under echo  With BP still > 160's will try low dose hydralazine adjunctively 25  bid  Have recommended she contact palliative care through her PCP   More than 50% of 40  min was spent in counseling related to the above

## 2017-10-21 NOTE — Patient Instructions (Addendum)
Medication Instructions: Your physician has recommended you make the following change in your medication:  -1) START Hydralazine 25 mg - Take 1 tablet (25 mg) by mouth twice daily  Labwork: Your physician has recommended that you have lab work today: BMET, TSH, CBC, Magnesium Panel  Procedures/Testing: Your physician has requested that you have an AV OPT echocardiogram. Echocardiography is a painless test that uses sound waves to create images of your heart. It provides your doctor with information about the size and shape of your heart and how well your heart's chambers and valves are working. This procedure takes approximately one hour. There are no restrictions for this procedure.  Follow-Up: Your physician wants you to follow-up in: 1 YEAR with Dr. Caryl Comes. You will receive a reminder letter in the mail two months in advance. If you don't receive a letter, please call our office to schedule the follow-up appointment.  Remote monitoring is used to monitor your Pacemaker from home. This monitoring reduces the number of office visits required to check your device to one time per year. It allows [place] to keep an eye on the functioning of your device to ensure it is working properly. You are scheduled for a device check from home on 01/18/18. You may send your transmission at any time that day. If you have a wireless device, the transmission will be sent automatically. After your physician reviews your transmission, you will receive a postcard with your next transmission date.   If you need a refill on your cardiac medications before your next appointment, please call your pharmacy.

## 2017-10-22 LAB — CBC WITH DIFFERENTIAL/PLATELET
Basophils Absolute: 0 10*3/uL (ref 0.0–0.2)
Basos: 0 %
EOS (ABSOLUTE): 0.2 10*3/uL (ref 0.0–0.4)
EOS: 2 %
HEMATOCRIT: 38.3 % (ref 34.0–46.6)
HEMOGLOBIN: 13 g/dL (ref 11.1–15.9)
Immature Grans (Abs): 0 10*3/uL (ref 0.0–0.1)
Immature Granulocytes: 0 %
LYMPHS ABS: 2.1 10*3/uL (ref 0.7–3.1)
Lymphs: 26 %
MCH: 32.4 pg (ref 26.6–33.0)
MCHC: 33.9 g/dL (ref 31.5–35.7)
MCV: 96 fL (ref 79–97)
MONOCYTES: 10 %
MONOS ABS: 0.8 10*3/uL (ref 0.1–0.9)
NEUTROS ABS: 4.8 10*3/uL (ref 1.4–7.0)
Neutrophils: 62 %
Platelets: 211 10*3/uL (ref 150–379)
RBC: 4.01 x10E6/uL (ref 3.77–5.28)
RDW: 12.6 % (ref 12.3–15.4)
WBC: 7.8 10*3/uL (ref 3.4–10.8)

## 2017-10-22 LAB — BASIC METABOLIC PANEL
BUN / CREAT RATIO: 13 (ref 12–28)
BUN: 20 mg/dL (ref 8–27)
CO2: 27 mmol/L (ref 20–29)
CREATININE: 1.53 mg/dL — AB (ref 0.57–1.00)
Calcium: 9.7 mg/dL (ref 8.7–10.3)
Chloride: 100 mmol/L (ref 96–106)
GFR calc Af Amer: 35 mL/min/{1.73_m2} — ABNORMAL LOW (ref >59)
GFR, EST NON AFRICAN AMERICAN: 31 mL/min/{1.73_m2} — AB (ref >59)
GLUCOSE: 87 mg/dL (ref 65–99)
POTASSIUM: 4.3 mmol/L (ref 3.5–5.2)
SODIUM: 142 mmol/L (ref 134–144)

## 2017-10-22 LAB — MAGNESIUM: Magnesium: 2 mg/dL (ref 1.6–2.3)

## 2017-10-22 LAB — TSH: TSH: 1.24 u[IU]/mL (ref 0.450–4.500)

## 2017-10-23 ENCOUNTER — Encounter: Payer: Self-pay | Admitting: Cardiology

## 2017-10-23 LAB — CUP PACEART REMOTE DEVICE CHECK
Battery Remaining Longevity: 140 mo
Battery Voltage: 3.06 V
Brady Statistic RA Percent Paced: 99.69 %
Date Time Interrogation Session: 20181210064315
Implantable Lead Implant Date: 20180226
Implantable Lead Location: 753860
Implantable Lead Model: 5076
Implantable Pulse Generator Implant Date: 20180226
Lead Channel Impedance Value: 285 Ohm
Lead Channel Pacing Threshold Amplitude: 1.25 V
Lead Channel Pacing Threshold Pulse Width: 0.4 ms
Lead Channel Pacing Threshold Pulse Width: 0.4 ms
Lead Channel Sensing Intrinsic Amplitude: 4 mV
Lead Channel Sensing Intrinsic Amplitude: 4 mV
MDC IDC LEAD IMPLANT DT: 20180226
MDC IDC LEAD LOCATION: 753859
MDC IDC MSMT LEADCHNL RA IMPEDANCE VALUE: 418 Ohm
MDC IDC MSMT LEADCHNL RV IMPEDANCE VALUE: 437 Ohm
MDC IDC MSMT LEADCHNL RV IMPEDANCE VALUE: 589 Ohm
MDC IDC MSMT LEADCHNL RV PACING THRESHOLD AMPLITUDE: 0.75 V
MDC IDC MSMT LEADCHNL RV SENSING INTR AMPL: 14.25 mV
MDC IDC MSMT LEADCHNL RV SENSING INTR AMPL: 14.25 mV
MDC IDC SET LEADCHNL RA PACING AMPLITUDE: 2 V
MDC IDC SET LEADCHNL RV PACING AMPLITUDE: 2.5 V
MDC IDC SET LEADCHNL RV PACING PULSEWIDTH: 0.4 ms
MDC IDC SET LEADCHNL RV SENSING SENSITIVITY: 2.8 mV
MDC IDC STAT BRADY AP VP PERCENT: 22.25 %
MDC IDC STAT BRADY AP VS PERCENT: 77.5 %
MDC IDC STAT BRADY AS VP PERCENT: 0.2 %
MDC IDC STAT BRADY AS VS PERCENT: 0.05 %
MDC IDC STAT BRADY RV PERCENT PACED: 22.45 %

## 2017-10-26 ENCOUNTER — Other Ambulatory Visit: Payer: Self-pay | Admitting: Cardiology

## 2017-11-04 ENCOUNTER — Ambulatory Visit (HOSPITAL_COMMUNITY): Payer: Medicare Other | Attending: Cardiology

## 2017-11-04 ENCOUNTER — Other Ambulatory Visit: Payer: Self-pay

## 2017-11-04 ENCOUNTER — Encounter (HOSPITAL_COMMUNITY): Payer: Self-pay | Admitting: *Deleted

## 2017-11-04 DIAGNOSIS — I10 Essential (primary) hypertension: Secondary | ICD-10-CM | POA: Insufficient documentation

## 2017-11-04 DIAGNOSIS — Z950 Presence of cardiac pacemaker: Secondary | ICD-10-CM | POA: Insufficient documentation

## 2017-11-04 DIAGNOSIS — I480 Paroxysmal atrial fibrillation: Secondary | ICD-10-CM | POA: Diagnosis not present

## 2017-11-04 DIAGNOSIS — E785 Hyperlipidemia, unspecified: Secondary | ICD-10-CM | POA: Insufficient documentation

## 2017-11-04 DIAGNOSIS — I081 Rheumatic disorders of both mitral and tricuspid valves: Secondary | ICD-10-CM | POA: Insufficient documentation

## 2017-11-04 DIAGNOSIS — Z8249 Family history of ischemic heart disease and other diseases of the circulatory system: Secondary | ICD-10-CM | POA: Insufficient documentation

## 2017-11-04 NOTE — Progress Notes (Unsigned)
Echo ordered as an AV-Optimization.  Tomi Bamberger (medtronic rep) was present, and discussed with Dr. Lovena Le (DOD) use of Av-opt, as Mrs. Tuckers pacer is set to an intrinsic rate and only has two leads.  They decided there were no changes to make.  A full 2D echo was completed per protocol, and Mrs. Un was released home.    Deliah Boston, RDCS

## 2017-11-05 DIAGNOSIS — J0100 Acute maxillary sinusitis, unspecified: Secondary | ICD-10-CM | POA: Diagnosis not present

## 2017-11-05 DIAGNOSIS — I10 Essential (primary) hypertension: Secondary | ICD-10-CM | POA: Diagnosis not present

## 2017-11-05 LAB — CUP PACEART INCLINIC DEVICE CHECK
Battery Remaining Longevity: 123 mo
Battery Voltage: 3.06 V
Brady Statistic AS VP Percent: 0.2 %
Brady Statistic RA Percent Paced: 99.69 %
Brady Statistic RV Percent Paced: 22.2 %
Date Time Interrogation Session: 20181212191140
Implantable Lead Implant Date: 20180226
Implantable Lead Location: 753859
Implantable Lead Model: 5076
Implantable Pulse Generator Implant Date: 20180226
Lead Channel Impedance Value: 285 Ohm
Lead Channel Impedance Value: 380 Ohm
Lead Channel Pacing Threshold Amplitude: 1.25 V
Lead Channel Pacing Threshold Pulse Width: 0.4 ms
Lead Channel Pacing Threshold Pulse Width: 0.4 ms
Lead Channel Sensing Intrinsic Amplitude: 3.75 mV
Lead Channel Setting Pacing Amplitude: 2.5 V
Lead Channel Setting Sensing Sensitivity: 2.8 mV
MDC IDC LEAD IMPLANT DT: 20180226
MDC IDC LEAD LOCATION: 753860
MDC IDC MSMT LEADCHNL RV IMPEDANCE VALUE: 437 Ohm
MDC IDC MSMT LEADCHNL RV IMPEDANCE VALUE: 589 Ohm
MDC IDC MSMT LEADCHNL RV PACING THRESHOLD AMPLITUDE: 0.75 V
MDC IDC MSMT LEADCHNL RV SENSING INTR AMPL: 14.875 mV
MDC IDC SET LEADCHNL RA PACING AMPLITUDE: 2.5 V
MDC IDC SET LEADCHNL RV PACING PULSEWIDTH: 0.4 ms
MDC IDC STAT BRADY AP VP PERCENT: 22 %
MDC IDC STAT BRADY AP VS PERCENT: 77.76 %
MDC IDC STAT BRADY AS VS PERCENT: 0.05 %

## 2017-11-05 NOTE — Progress Notes (Signed)
This unfortunatley was not what we wanted With her long PR interval the planhad been to reprogramm her AV delays and look at MR and Aortic TVI  She should be invited back so we can do what we need at [place]

## 2017-11-10 ENCOUNTER — Other Ambulatory Visit: Payer: Self-pay

## 2017-11-10 ENCOUNTER — Emergency Department (HOSPITAL_COMMUNITY)
Admission: EM | Admit: 2017-11-10 | Discharge: 2017-11-10 | Disposition: A | Discharge status: Home / Self Care | Payer: Medicare Other | Attending: Emergency Medicine | Admitting: Emergency Medicine

## 2017-11-10 ENCOUNTER — Emergency Department (HOSPITAL_COMMUNITY): Payer: Medicare Other

## 2017-11-10 DIAGNOSIS — B9789 Other viral agents as the cause of diseases classified elsewhere: Secondary | ICD-10-CM | POA: Insufficient documentation

## 2017-11-10 DIAGNOSIS — R531 Weakness: Secondary | ICD-10-CM | POA: Insufficient documentation

## 2017-11-10 DIAGNOSIS — I10 Essential (primary) hypertension: Secondary | ICD-10-CM | POA: Insufficient documentation

## 2017-11-10 DIAGNOSIS — Z950 Presence of cardiac pacemaker: Secondary | ICD-10-CM | POA: Diagnosis not present

## 2017-11-10 DIAGNOSIS — Z7901 Long term (current) use of anticoagulants: Secondary | ICD-10-CM | POA: Diagnosis not present

## 2017-11-10 DIAGNOSIS — R197 Diarrhea, unspecified: Secondary | ICD-10-CM | POA: Diagnosis not present

## 2017-11-10 DIAGNOSIS — Z79899 Other long term (current) drug therapy: Secondary | ICD-10-CM | POA: Insufficient documentation

## 2017-11-10 DIAGNOSIS — R0602 Shortness of breath: Secondary | ICD-10-CM | POA: Diagnosis not present

## 2017-11-10 DIAGNOSIS — R509 Fever, unspecified: Secondary | ICD-10-CM | POA: Insufficient documentation

## 2017-11-10 DIAGNOSIS — Z96642 Presence of left artificial hip joint: Secondary | ICD-10-CM | POA: Insufficient documentation

## 2017-11-10 DIAGNOSIS — R110 Nausea: Secondary | ICD-10-CM | POA: Insufficient documentation

## 2017-11-10 DIAGNOSIS — J069 Acute upper respiratory infection, unspecified: Secondary | ICD-10-CM | POA: Diagnosis not present

## 2017-11-10 DIAGNOSIS — R404 Transient alteration of awareness: Secondary | ICD-10-CM | POA: Diagnosis not present

## 2017-11-10 DIAGNOSIS — R05 Cough: Secondary | ICD-10-CM | POA: Diagnosis not present

## 2017-11-10 LAB — CBC WITH DIFFERENTIAL/PLATELET
Basophils Absolute: 0 10*3/uL (ref 0.0–0.1)
Basophils Relative: 0 %
EOS PCT: 1 %
Eosinophils Absolute: 0.1 10*3/uL (ref 0.0–0.7)
HEMATOCRIT: 33.8 % — AB (ref 36.0–46.0)
Hemoglobin: 11.3 g/dL — ABNORMAL LOW (ref 12.0–15.0)
LYMPHS ABS: 1 10*3/uL (ref 0.7–4.0)
LYMPHS PCT: 17 %
MCH: 31.8 pg (ref 26.0–34.0)
MCHC: 33.4 g/dL (ref 30.0–36.0)
MCV: 95.2 fL (ref 78.0–100.0)
Monocytes Absolute: 0.5 10*3/uL (ref 0.1–1.0)
Monocytes Relative: 8 %
NEUTROS ABS: 4.2 10*3/uL (ref 1.7–7.7)
Neutrophils Relative %: 74 %
PLATELETS: 184 10*3/uL (ref 150–400)
RBC: 3.55 MIL/uL — AB (ref 3.87–5.11)
RDW: 12.4 % (ref 11.5–15.5)
WBC: 5.7 10*3/uL (ref 4.0–10.5)

## 2017-11-10 LAB — URINALYSIS, ROUTINE W REFLEX MICROSCOPIC
Bilirubin Urine: NEGATIVE
Glucose, UA: NEGATIVE mg/dL
HGB URINE DIPSTICK: NEGATIVE
Ketones, ur: NEGATIVE mg/dL
LEUKOCYTES UA: NEGATIVE
Nitrite: NEGATIVE
PROTEIN: NEGATIVE mg/dL
SPECIFIC GRAVITY, URINE: 1.003 — AB (ref 1.005–1.030)
pH: 7 (ref 5.0–8.0)

## 2017-11-10 LAB — I-STAT TROPONIN, ED: Troponin i, poc: 0 ng/mL (ref 0.00–0.08)

## 2017-11-10 LAB — COMPREHENSIVE METABOLIC PANEL WITH GFR
ALT: 37 U/L (ref 14–54)
AST: 42 U/L — ABNORMAL HIGH (ref 15–41)
Albumin: 3.6 g/dL (ref 3.5–5.0)
Alkaline Phosphatase: 77 U/L (ref 38–126)
Anion gap: 8 (ref 5–15)
BUN: 13 mg/dL (ref 6–20)
CO2: 26 mmol/L (ref 22–32)
Calcium: 9.3 mg/dL (ref 8.9–10.3)
Chloride: 104 mmol/L (ref 101–111)
Creatinine, Ser: 1 mg/dL (ref 0.44–1.00)
GFR calc Af Amer: 58 mL/min — ABNORMAL LOW
GFR calc non Af Amer: 50 mL/min — ABNORMAL LOW
Glucose, Bld: 114 mg/dL — ABNORMAL HIGH (ref 65–99)
Potassium: 3.4 mmol/L — ABNORMAL LOW (ref 3.5–5.1)
Sodium: 138 mmol/L (ref 135–145)
Total Bilirubin: 1.3 mg/dL — ABNORMAL HIGH (ref 0.3–1.2)
Total Protein: 6.6 g/dL (ref 6.5–8.1)

## 2017-11-10 LAB — I-STAT CG4 LACTIC ACID, ED
Lactic Acid, Venous: 0.92 mmol/L (ref 0.5–1.9)
Lactic Acid, Venous: 1.04 mmol/L (ref 0.5–1.9)

## 2017-11-10 MED ORDER — ALBUTEROL SULFATE (2.5 MG/3ML) 0.083% IN NEBU
5.0000 mg | INHALATION_SOLUTION | Freq: Once | RESPIRATORY_TRACT | Status: AC
  Administered 2017-11-10: 5 mg via RESPIRATORY_TRACT
  Filled 2017-11-10: qty 6

## 2017-11-10 NOTE — ED Notes (Signed)
PurWick applied

## 2017-11-10 NOTE — ED Provider Notes (Signed)
[facility] EMERGENCY DEPARTMENT Provider Note   CSN: [id number] Arrival date & time: 11/10/17  1117     History   Chief Complaint Chief Complaint  Patient presents with  . Cough  . Weakness    HPI [name] is a 84 y.o. female.  84yo F w/ PMH including A fib, HTN, HLD who p/w cough.  4 days ago the patient began having a productive cough associated with subjective fevers, diarrhea, and nausea.  No vomiting or chest pain.  No sick contacts.  She has felt generally weak.  She saw a PCP a few days ago and was started on amoxicillin with no improvement in her symptoms.  She later notes that her husband has been ill and she was up with him all night.  She wonders whether fatigue has played a role in her feeling worse this morning.   The history is provided by the patient.  Cough   Weakness     Past Medical History:  Diagnosis Date  . Anxiety   . Aortic valve sclerosis   . Atrial fibrillation ([place])   . Basal cell carcinoma    hx; near eyes  . GERD (gastroesophageal reflux disease)   . H/O one miscarriage   . Hard of hearing    has hearing aides but does not wear   . History of hiatal hernia   . History of kidney stones   . Hyperlipidemia   . Hypertension   . OA (osteoarthritis)    "right pointer; right hip" (01/05/2017)  . Obesity   . Renal insufficiency   . SSS (sick sinus syndrome) ([place]) 01/05/2017   a. s/p MDT dual chamber PPM   . Tinnitus   . Varicose veins   . Wears glasses     Patient Active Problem List   Diagnosis Date Noted  . Sick sinus syndrome ([place]) 01/05/2017  . Overweight (BMI 25.0-29.9) 12/19/2015  . S/P left THA, AA 12/18/2015  . Chronic venous insufficiency 11/16/2015  . Varicose veins of both lower extremities 11/16/2015  . Edema 05/20/2012  . Bradycardia   . Hypertension   . Hyperlipidemia     Past Surgical History:  Procedure Laterality Date  . BREAST CYST EXCISION Right 1977   "benign"  . CARDIOVERSION N/A  07/25/2016   Procedure: CARDIOVERSION;  Surgeon: Lelon Perla, MD;  Location: [facility] ENDOSCOPY;  Service: Cardiovascular;  Laterality: N/A;  . CATARACT EXTRACTION W/ INTRAOCULAR LENS  IMPLANT, BILATERAL Bilateral   . DILATION AND CURETTAGE OF UTERUS    . NASAL SEPTUM SURGERY    . OVARIAN CYST REMOVAL  2002  . PACEMAKER IMPLANT N/A 01/05/2017   Procedure: Pacemaker Implant;  Surgeon: Will Meredith Leeds, MD;  Location: [facility] CV LAB;  Service: Cardiovascular;  Laterality: N/A;  . TEE WITHOUT CARDIOVERSION N/A 12/16/2016   Procedure: TRANSESOPHAGEAL ECHOCARDIOGRAM (TEE);  Surgeon: Larey Dresser, MD;  Location: [facility];  Service: Cardiovascular;  Laterality: N/A;  . TONSILLECTOMY    . TOTAL HIP ARTHROPLASTY Left 12/18/2015   Procedure: LEFT TOTAL HIP ARTHROPLASTY ANTERIOR APPROACH;  Surgeon: Paralee Cancel, MD;  Location: [facility] ORS;  Service: Orthopedics;  Laterality: Left;    OB History    No data available       Home Medications    Prior to Admission medications   Medication Sig Start Date End Date Taking? Authorizing Provider  acetaminophen (TYLENOL) 500 MG tablet Take 500-1,000 mg by mouth every 6 (six) hours as needed for headache (  pain).   Yes [provider]  ALPRAZolam Duanne Moron) 0.5 MG tablet Take 0.25 mg by mouth at bedtime.    Yes [provider]  amiodarone (PACERONE) 100 MG tablet Take 1 tablet (100 mg total) by mouth daily. 04/17/17  Yes Deboraha Sprang, MD  amoxicillin (AMOXIL) 875 MG tablet Take 875 mg by mouth 2 (two) times daily. 10 day course started 11/05/17 11/05/17  Yes [provider]  antiseptic oral rinse (BIOTENE) LIQD 15 mLs by Mouth Rinse route as needed for dry mouth.   Yes [provider]  Biotin 5000 MCG TABS Take 5,000 mcg by mouth See admin instructions. Take 1 tablet (5000 mcg) by mouth every other day at noon   Yes [provider]  busPIRone (BUSPAR) 5 MG tablet Take 5 mg by mouth 2 (two) times daily.   Yes  [provider]  carvedilol (COREG) 25 MG tablet Take 1 tablet (25 mg total) 2 (two) times daily with a meal by mouth. 09/28/17  Yes Deboraha Sprang, MD  furosemide (LASIX) 20 MG tablet TAKE 1 TABLET(20 MG) BY MOUTH DAILY 10/26/17  Yes Camnitz, Ocie Doyne, MD  hydrALAZINE (APRESOLINE) 25 MG tablet Take 1 tablet (25 mg total) by mouth 2 (two) times daily. 10/21/17 01/19/18 Yes Deboraha Sprang, MD  irbesartan (AVAPRO) 300 MG tablet Take 1 tablet (300 mg total) by mouth daily. Patient taking differently: Take 300 mg by mouth daily at 12 noon.  06/05/17  Yes Camnitz, Ocie Doyne, MD  Magnesium 500 MG CAPS Take 500 mg by mouth See admin instructions. Take 1 capsule (500 mg) by mouth every other day - before supper   Yes [provider]  Multiple Vitamin (MULTIVITAMIN WITH MINERALS) TABS tablet Take 1 tablet by mouth daily.   Yes [provider]  omeprazole (PRILOSEC) 40 MG capsule Take 40 mg by mouth at bedtime as needed (acid reflux).  10/26/17  Yes [provider]  Polyethyl Glycol-Propyl Glycol (SYSTANE OP) Place 1 drop into both eyes at bedtime as needed (dry eyes).   Yes [provider]  polyethylene glycol (MIRALAX / GLYCOLAX) packet Take 17 g by mouth daily as needed (constipation).    Yes [provider]  pyridOXINE (VITAMIN B-6) 100 MG tablet Take 100 mg by mouth every other day.   Yes [provider]  XARELTO 15 MG TABS tablet TAKE 1 TABLET(15 MG) BY MOUTH DAILY WITH SUPPER Patient taking differently: TAKE 1 TABLET(15 MG) BY MOUTH DAILY BEFORE SUPPER 05/25/17  Yes Deboraha Sprang, MD    Family History Family History  Problem Relation Age of Onset  . Diabetes Mother   . Heart disease Father   . Hypertension Sister   . Hypertension Brother     Social History Social History   Tobacco Use  . Smoking status: Never Smoker  . Smokeless tobacco: Never Used  Substance Use Topics  . Alcohol use: No  . Drug use: No      Allergies   Celebrex [celecoxib]; Codeine; Ace inhibitors; Norvasc [amlodipine besylate]; and Vioxx [rofecoxib]   Review of Systems Review of Systems  Respiratory: Positive for cough.   Neurological: Positive for weakness.   All other systems reviewed and are negative except that which was mentioned in HPI   Physical Exam Updated Vital Signs BP (!) 170/84 (BP Location: Right Arm)   Pulse 60   Temp 97.9 F (36.6 C) (Oral)   Resp 16   Ht 5\' 4"  (1.626 m)  Wt 77.1 kg (170 lb)   SpO2 95%   BMI 29.18 kg/m   Physical Exam  Constitutional: She is oriented to person, place, and time. She appears well-developed and well-nourished. No distress.  HENT:  Head: Normocephalic and atraumatic.  Moist mucous membranes  Eyes: Conjunctivae are normal. Pupils are equal, round, and reactive to light.  Neck: Neck supple.  Cardiovascular: Normal rate. An irregularly irregular rhythm present.  Murmur heard. Pulmonary/Chest: Effort normal and breath sounds normal.  Abdominal: Soft. Bowel sounds are normal. She exhibits no distension. There is no tenderness.  Musculoskeletal: She exhibits no edema.  Neurological: She is alert and oriented to person, place, and time.  Fluent speech  Skin: Skin is warm and dry.  Psychiatric: She has a normal mood and affect. Judgment normal.  Nursing note and vitals reviewed.    ED Treatments / Results  Labs (all labs ordered are listed, but only abnormal results are displayed) Labs Reviewed  COMPREHENSIVE METABOLIC PANEL - Abnormal; Notable for the following components:      Result Value   Potassium 3.4 (*)    Glucose, Bld 114 (*)    AST 42 (*)    Total Bilirubin 1.3 (*)    GFR calc non Af Amer 50 (*)    GFR calc Af Amer 58 (*)    All other components within normal limits  CBC WITH DIFFERENTIAL/PLATELET - Abnormal; Notable for the following components:   RBC 3.55 (*)    Hemoglobin 11.3 (*)    HCT 33.8 (*)    All other components within  normal limits  URINALYSIS, ROUTINE W REFLEX MICROSCOPIC - Abnormal; Notable for the following components:   Color, Urine COLORLESS (*)    Specific Gravity, Urine 1.003 (*)    All other components within normal limits  I-STAT CG4 LACTIC ACID, ED  I-STAT TROPONIN, ED  I-STAT CG4 LACTIC ACID, ED    EKG  EKG Interpretation  Date/Time:  Tuesday November 10 2017 11:30:00 EST Ventricular Rate:  69 PR Interval:    QRS Duration: 125 QT Interval:  448 QTC Calculation: 480 R Axis:   -16 Text Interpretation:  Junctional rhythm Left ventricular hypertrophy Borderline T abnormalities, anterior leads No significant change since last tracing Confirmed by Theotis Burrow [phone]) on 11/10/2017 12:38:30 PM       Radiology Dg Chest 2 View  Result Date: 11/10/2017 CLINICAL DATA:  Cough for 4 days and shortness of Breath EXAM: CHEST  2 VIEW COMPARISON:  02/08/2017 FINDINGS: Cardiac shadow is stable. Pacing device is again noted and stable. The lungs are well aerated bilaterally. No focal infiltrate is seen. No bony abnormality is noted. IMPRESSION: No active cardiopulmonary disease. Electronically Signed   By: Inez Catalina M.D.   On: 11/10/2017 13:09    Procedures Procedures (including critical care time)  Medications Ordered in ED Medications  albuterol (PROVENTIL) (2.5 MG/3ML) 0.083% nebulizer solution 5 mg (5 mg Nebulization Given 11/10/17 1201)     Initial Impression / Assessment and Plan / ED Course  I have reviewed the triage vital signs and the nursing notes.  Pertinent labs & imaging results that were available during my care of the patient were reviewed by me and considered in my medical decision making (see chart for details).     Pt w/ 1 week URI sx including cough, given course of amox.  She was well-appearing on exam, vital signs notable for hypertension, has not had hypertension medication this afternoon.  Clear breath sounds, no wheezing.  Chest x-ray unremarkable.  Her EKG showed  paced rhythm, EMS noted intermittent sinus rhythm and intermittent paced rhythm.  Obtained labs which showed normal WBC count, normal troponin, normal lactate.  On reassessment she remains well-appearing.  She later notes that she actually is starting to feel better, she thinks that staying up all night with her husband made her feel worse this morning.  Daughter notes that it was actually the neighbors who insisted on calling EMS this morning.  Because of her clear chest x-ray, clear lung sounds, and normal O2 saturation, she feels comfortable with discharge without antibiotics.  She will follow-up with PCP in a few days for reassessment.  Extensively reviewed return precautions.  She voiced understanding and was discharged in satisfactory condition.  Final Clinical Impressions(s) / ED Diagnoses   Final diagnoses:  Viral URI with cough    ED Discharge Orders    None       Mahum Betten, Wenda Overland, MD 11/10/17 1623

## 2017-11-10 NOTE — Discharge Instructions (Signed)
Return to ER if you have any worsening breathing problems, fever, chest pain, or sudden change in symptoms.

## 2017-11-10 NOTE — ED Notes (Signed)
ED Provider at bedside. 

## 2017-11-10 NOTE — ED Triage Notes (Signed)
Patient has a cough x 4 days and went to PCP and was prescribed amoxicillin. Patient has had a productive cough with green / yellow color.

## 2017-11-12 ENCOUNTER — Other Ambulatory Visit: Payer: Self-pay

## 2017-11-12 DIAGNOSIS — I482 Chronic atrial fibrillation, unspecified: Secondary | ICD-10-CM

## 2017-11-12 DIAGNOSIS — J069 Acute upper respiratory infection, unspecified: Secondary | ICD-10-CM | POA: Diagnosis not present

## 2017-11-12 DIAGNOSIS — I10 Essential (primary) hypertension: Secondary | ICD-10-CM | POA: Diagnosis not present

## 2017-11-17 ENCOUNTER — Ambulatory Visit (HOSPITAL_COMMUNITY): Payer: Medicare Other | Attending: Cardiology

## 2017-11-17 DIAGNOSIS — I482 Chronic atrial fibrillation, unspecified: Secondary | ICD-10-CM

## 2017-11-18 ENCOUNTER — Other Ambulatory Visit: Payer: Self-pay | Admitting: Internal Medicine

## 2017-11-24 ENCOUNTER — Telehealth: Payer: Self-pay | Admitting: Internal Medicine

## 2017-11-24 MED ORDER — HYDRALAZINE HCL 25 MG PO TABS: 12.5000 mg | ORAL_TABLET | Freq: Two times a day (BID) | ORAL | 3 refills | Status: DC

## 2017-11-24 NOTE — Telephone Encounter (Signed)
Looks like hydralazine was started by Dr Caryl Comes at December office visit. Pt typically feels dizzy with systolic BP readings < 883. Would advise pt to cut her hydralazine in half and take 12.5mg  BID while her husband is not home and she has been less stressed/getting better sleep. If her BP starts to rise again once he returns home, would advise her to increase her hydralazine dose back to the full tablet BID.

## 2017-11-24 NOTE — Telephone Encounter (Signed)
Patient calling stating that her BP has been better since she has started getting better rest at night. Patient stated right now her husband is in rehab, and he will be back at home in 20 days. Patient is concerned about her BP dropping too low, but she thinks it is due to her not having to take care of her husband and getting the rest she needs. Patient stated today her SBP was 119 this morning, so she did not take her hydralazine. This afternoon BP 126/68 HR 94. Informed patient to start recording her BP's for now. Encouraged patient to check her BP before she takes her medication and if her BP is too low to hold medication and call our office. Patient verbalized understanding. Will forward to [employer] the pharmacist for further advisement, since patient has seen her in [facility]pertension Clinic in the past.

## 2017-11-24 NOTE — Telephone Encounter (Signed)
New message   Pt c/o medication issue:  1. Name of Medication: hydrALAZINE (APRESOLINE) 25 MG tablet  2. How are you currently taking this medication (dosage and times per day)? Take 1 tablet (25 mg total) by mouth 2 (two) times daily  3. Are you having a reaction (difficulty breathing--STAT)? Low bp  4. What is your medication issue? Patient is having low bp that's lower than normal Please call

## 2017-11-24 NOTE — Telephone Encounter (Signed)
Patient agree with [name]'s recommendations and will try taking hydralzazine 12.5 mg BID

## 2017-12-01 DIAGNOSIS — H5213 Myopia, bilateral: Secondary | ICD-10-CM | POA: Diagnosis not present

## 2018-01-06 ENCOUNTER — Other Ambulatory Visit: Payer: Self-pay | Admitting: Cardiology

## 2018-01-13 ENCOUNTER — Telehealth: Payer: Self-pay | Admitting: Internal Medicine

## 2018-01-13 ENCOUNTER — Other Ambulatory Visit: Payer: Self-pay | Admitting: *Deleted

## 2018-01-13 MED ORDER — FUROSEMIDE 20 MG PO TABS: ORAL_TABLET | ORAL | 2 refills | Status: DC

## 2018-01-13 NOTE — Telephone Encounter (Signed)
Pt stated she began taking her 25mg  hydralazine tablet bid about two weeks ago. Previously, she had been taking 12.5mg  bid per Jinny Blossom Supple's suggestion for hypotension and dizziness. She stated her blood pressure has been measuring in the 150's for the last week. I asked her what time in the day was she taking her reading. She said "at breakfast when I take my medicines." I advised her to take her readings about 2 hours after she take her medications. If her readings are still on the high side, call back and we will discuss this with Dr Caryl Comes. She agreed to the plan and had no additional questions.

## 2018-01-13 NOTE — Telephone Encounter (Signed)
Per epic, patient should have refills. I called patient and she stated that the pharmacy has only been refilling this for #30 with each refill but she would prefer #90. She is aware that I will send in a new ninety day rx. Patient very appreciative.

## 2018-01-13 NOTE — Telephone Encounter (Signed)
New Message  Pt c/o medication issue:  1. Name of Medication: hydrALAZINE (APRESOLINE) 25 MG tablet  2. How are you currently taking this medication (dosage and times per day)? Take 0.5 tablets (12.5 mg total) by mouth 2 (two) times daily  3. Are you having a reaction (difficulty breathing--STAT)? no  4. What is your medication issue? Pt verbalized that her dosage was change to half but it ran high so she went back to taking the whole tablet but her bp is still not regulated. Please call

## 2018-01-13 NOTE — Telephone Encounter (Signed)
New Message   *STAT* If patient is at the pharmacy, call can be transferred to refill team.   1. Which medications need to be refilled? (please list name of each medication and dose if known) furosemide (LASIX) 20 MG tablet  2. Which pharmacy/location (including street and city if local pharmacy) is medication to be sent to? [id number] - [place], [place]  3. Do they need a 30 day or 90 day supply? [place]

## 2018-01-18 ENCOUNTER — Ambulatory Visit (INDEPENDENT_AMBULATORY_CARE_PROVIDER_SITE_OTHER): Payer: Medicare Other | Admitting: *Deleted

## 2018-01-18 DIAGNOSIS — I495 Sick sinus syndrome: Secondary | ICD-10-CM

## 2018-01-18 NOTE — Progress Notes (Signed)
Remote pacemaker transmission.   

## 2018-01-20 ENCOUNTER — Encounter: Payer: Self-pay | Admitting: Cardiology

## 2018-01-21 LAB — CUP PACEART REMOTE DEVICE CHECK
Battery Voltage: 3.01 V
Brady Statistic AS VS Percent: 0.19 %
Brady Statistic RA Percent Paced: 99.36 %
Date Time Interrogation Session: 20190311055252
Implantable Lead Implant Date: 20180226
Implantable Lead Location: 753860
Implantable Lead Model: 5076
Implantable Pulse Generator Implant Date: 20180226
Lead Channel Impedance Value: 285 Ohm
Lead Channel Pacing Threshold Amplitude: 1.25 V
Lead Channel Pacing Threshold Pulse Width: 0.4 ms
Lead Channel Pacing Threshold Pulse Width: 0.4 ms
Lead Channel Sensing Intrinsic Amplitude: 4.875 mV
Lead Channel Setting Pacing Amplitude: 2.5 V
MDC IDC LEAD IMPLANT DT: 20180226
MDC IDC LEAD LOCATION: 753859
MDC IDC MSMT BATTERY REMAINING LONGEVITY: 118 mo
MDC IDC MSMT LEADCHNL RA IMPEDANCE VALUE: 399 Ohm
MDC IDC MSMT LEADCHNL RA SENSING INTR AMPL: 4.875 mV
MDC IDC MSMT LEADCHNL RV IMPEDANCE VALUE: 418 Ohm
MDC IDC MSMT LEADCHNL RV IMPEDANCE VALUE: 551 Ohm
MDC IDC MSMT LEADCHNL RV PACING THRESHOLD AMPLITUDE: 0.75 V
MDC IDC MSMT LEADCHNL RV SENSING INTR AMPL: 14.25 mV
MDC IDC MSMT LEADCHNL RV SENSING INTR AMPL: 14.25 mV
MDC IDC SET LEADCHNL RV PACING AMPLITUDE: 2.5 V
MDC IDC SET LEADCHNL RV PACING PULSEWIDTH: 0.4 ms
MDC IDC SET LEADCHNL RV SENSING SENSITIVITY: 2.8 mV
MDC IDC STAT BRADY AP VP PERCENT: 98.94 %
MDC IDC STAT BRADY AP VS PERCENT: 0.62 %
MDC IDC STAT BRADY AS VP PERCENT: 0.25 %
MDC IDC STAT BRADY RV PERCENT PACED: 99.18 %

## 2018-03-25 ENCOUNTER — Telehealth: Payer: Self-pay | Admitting: Internal Medicine

## 2018-03-25 NOTE — Telephone Encounter (Signed)
Pt c/o BP issue: STAT if pt c/o blurred vision, one-sided weakness or slurred speech  1. What are your last 5 BP readings?  03/25/18    116/67 03/24/18      84/47  2. Are you having any other symptoms (ex. Dizziness, headache, blurred vision, passed out)? Dizziness  3. What is your BP issue? Very low

## 2018-03-25 NOTE — Telephone Encounter (Signed)
Spoke with pt who has c/o hypotension and dizziness. Upon review of her medications, she states she has been "taking the whole pill of hydralizine two times per day" (25mg ). Currently she is ordered to take 12.5, half tablet twice daily. I advised her to begin taking 12.5mg  bid and to monitor her BP through the weekend. I also asked her to take her BP today before her evening dose and if her BP was running low (SBP < 110) to hold her evening dose, beginning the 12.5mg  tomorrow morning. Pt is to follow up with [place] next week regarding her avg BP's for possible med changes if needed. Pt verbalized understanding and had no additional questions.

## 2018-04-12 ENCOUNTER — Other Ambulatory Visit: Payer: Self-pay | Admitting: Internal Medicine

## 2018-04-13 NOTE — Telephone Encounter (Signed)
Pt is a 84 yr old female who last saw Dr Caryl Comes on 10/21/17, weight at that time was 77.6Kg. Last SCr was 1.0 on 11/10/17. CrCl is 50 mL/min. Will refill Xarelto 15mg  QD.

## 2018-04-19 ENCOUNTER — Telehealth: Payer: Self-pay

## 2018-04-19 ENCOUNTER — Ambulatory Visit (INDEPENDENT_AMBULATORY_CARE_PROVIDER_SITE_OTHER): Payer: Medicare Other | Admitting: *Deleted

## 2018-04-19 DIAGNOSIS — I495 Sick sinus syndrome: Secondary | ICD-10-CM

## 2018-04-19 NOTE — Telephone Encounter (Signed)
Spoke with pt and reminded pt of remote transmission that is due today. Pt verbalized understanding.   

## 2018-04-20 ENCOUNTER — Encounter: Payer: Self-pay | Admitting: Cardiology

## 2018-04-20 LAB — CUP PACEART REMOTE DEVICE CHECK
Battery Voltage: 3 V
Brady Statistic AS VP Percent: 0.12 %
Brady Statistic RA Percent Paced: 99.8 %
Date Time Interrogation Session: 20190610202859
Implantable Lead Location: 753860
Implantable Lead Model: 5076
Implantable Pulse Generator Implant Date: 20180226
Lead Channel Impedance Value: 285 Ohm
Lead Channel Pacing Threshold Amplitude: 1.25 V
Lead Channel Pacing Threshold Pulse Width: 0.4 ms
Lead Channel Pacing Threshold Pulse Width: 0.4 ms
Lead Channel Sensing Intrinsic Amplitude: 17.625 mV
Lead Channel Sensing Intrinsic Amplitude: 17.625 mV
Lead Channel Sensing Intrinsic Amplitude: 3.75 mV
Lead Channel Setting Pacing Pulse Width: 0.4 ms
MDC IDC LEAD IMPLANT DT: 20180226
MDC IDC LEAD IMPLANT DT: 20180226
MDC IDC LEAD LOCATION: 753859
MDC IDC MSMT BATTERY REMAINING LONGEVITY: 111 mo
MDC IDC MSMT LEADCHNL RA IMPEDANCE VALUE: 399 Ohm
MDC IDC MSMT LEADCHNL RA SENSING INTR AMPL: 3.75 mV
MDC IDC MSMT LEADCHNL RV IMPEDANCE VALUE: 418 Ohm
MDC IDC MSMT LEADCHNL RV IMPEDANCE VALUE: 532 Ohm
MDC IDC MSMT LEADCHNL RV PACING THRESHOLD AMPLITUDE: 0.625 V
MDC IDC SET LEADCHNL RA PACING AMPLITUDE: 2.5 V
MDC IDC SET LEADCHNL RV PACING AMPLITUDE: 2.5 V
MDC IDC SET LEADCHNL RV SENSING SENSITIVITY: 2.8 mV
MDC IDC STAT BRADY AP VP PERCENT: 99.36 %
MDC IDC STAT BRADY AP VS PERCENT: 0.37 %
MDC IDC STAT BRADY AS VS PERCENT: 0.15 %
MDC IDC STAT BRADY RV PERCENT PACED: 99.48 %

## 2018-04-20 NOTE — Progress Notes (Signed)
Remote pacemaker transmission.   

## 2018-04-28 ENCOUNTER — Other Ambulatory Visit: Payer: Self-pay | Admitting: Internal Medicine

## 2018-05-03 ENCOUNTER — Telehealth: Payer: Self-pay

## 2018-05-03 NOTE — Telephone Encounter (Signed)
I have done an Amiodarone PA through covermymeds,

## 2018-05-04 NOTE — Telephone Encounter (Signed)
**Note De-Identified [name] Obfuscation** Message received through covermymeds: [name] Key: [id number] Outcome  Approved on June 24  Effective from 05/03/2018 through 05/04/2019.

## 2018-06-04 ENCOUNTER — Telehealth: Payer: Self-pay | Admitting: Internal Medicine

## 2018-06-04 NOTE — Telephone Encounter (Signed)
Pt is calling regarding her concerns with possibly going in and out of afib. She is also dealing with caring for her husband and selling her house. She is under a lot of stress. She would like to be sure she isn't in afib.   I have advised her I would send the Device clinic a message a call her on Monday 7/29 for a remote check.  I will follow up with pt on Tuesday 7/30.

## 2018-06-04 NOTE — Telephone Encounter (Signed)
New Message    Patient is calling because she has not being feeling well for a few days. She said she has been feeling tired and thinks she may be in afib. But she is not sure. Please call.

## 2018-06-07 ENCOUNTER — Other Ambulatory Visit: Payer: Self-pay | Admitting: Cardiology

## 2018-06-07 NOTE — Telephone Encounter (Signed)
Remote transmission reviewed. Presenting rhythm: ApVp. No atrial or ventricular high rate episodes recorded. Stable lead measurements. Normal device function.  Called to inform patient that she's been in SR and hasn't had any episodes. Patient verbalized understanding and appreciation of information.

## 2018-06-07 NOTE — Telephone Encounter (Signed)
Called bc remote was not received. Patient states that she was having trouble getting it to send. I asked patient to attempt it again. Patient attempted and received an error code. # for monitor tech svcs given. Patient verbalized understanding.

## 2018-06-07 NOTE — Telephone Encounter (Signed)
Called and requested that patient send a remote transmission. Patient verbalized understanding.  Will call her back once it's received.

## 2018-06-10 DIAGNOSIS — K219 Gastro-esophageal reflux disease without esophagitis: Secondary | ICD-10-CM | POA: Diagnosis not present

## 2018-06-10 DIAGNOSIS — R531 Weakness: Secondary | ICD-10-CM | POA: Diagnosis not present

## 2018-06-10 DIAGNOSIS — R490 Dysphonia: Secondary | ICD-10-CM | POA: Diagnosis not present

## 2018-06-10 DIAGNOSIS — I10 Essential (primary) hypertension: Secondary | ICD-10-CM | POA: Diagnosis not present

## 2018-06-22 DIAGNOSIS — R531 Weakness: Secondary | ICD-10-CM | POA: Diagnosis not present

## 2018-06-22 DIAGNOSIS — M25552 Pain in left hip: Secondary | ICD-10-CM | POA: Diagnosis not present

## 2018-06-25 DIAGNOSIS — M25552 Pain in left hip: Secondary | ICD-10-CM | POA: Diagnosis not present

## 2018-06-25 DIAGNOSIS — R531 Weakness: Secondary | ICD-10-CM | POA: Diagnosis not present

## 2018-06-29 DIAGNOSIS — M25552 Pain in left hip: Secondary | ICD-10-CM | POA: Diagnosis not present

## 2018-06-29 DIAGNOSIS — R748 Abnormal levels of other serum enzymes: Secondary | ICD-10-CM | POA: Diagnosis not present

## 2018-06-29 DIAGNOSIS — R945 Abnormal results of liver function studies: Secondary | ICD-10-CM | POA: Diagnosis not present

## 2018-06-29 DIAGNOSIS — R531 Weakness: Secondary | ICD-10-CM | POA: Diagnosis not present

## 2018-07-02 DIAGNOSIS — M25552 Pain in left hip: Secondary | ICD-10-CM | POA: Diagnosis not present

## 2018-07-02 DIAGNOSIS — R531 Weakness: Secondary | ICD-10-CM | POA: Diagnosis not present

## 2018-07-06 DIAGNOSIS — M25552 Pain in left hip: Secondary | ICD-10-CM | POA: Diagnosis not present

## 2018-07-06 DIAGNOSIS — R531 Weakness: Secondary | ICD-10-CM | POA: Diagnosis not present

## 2018-07-06 DIAGNOSIS — I10 Essential (primary) hypertension: Secondary | ICD-10-CM | POA: Diagnosis not present

## 2018-07-07 ENCOUNTER — Telehealth: Payer: Self-pay | Admitting: Internal Medicine

## 2018-07-07 NOTE — Telephone Encounter (Signed)
New Message:    Pt c/o medication issue:  1. Name of Medication: hydrALAZINE (APRESOLINE) 25 MG tablet(Expired)  2. How are you currently taking this medication (dosage and times per day)?  Take 0.5 tablets (12.5 mg total) by mouth 2 (two) times daily.  3. Are you having a reaction (difficulty breathing--STAT)? No   4. What is your medication issue? Pt states the medication is making her sick

## 2018-07-08 NOTE — Telephone Encounter (Signed)
Spoke with pt today who believes her hydralazine is making her dizzy and tired. Pt has been on this medication for some time now but believes she has been having these symptoms for a while. Pt also states she has had some uncontrollable BP readings with SBP in the 160's. I have scheduled pt to come into our HTN clinic next week for a BP check and medication adjustment and advisement. Pt has agreed to this plan and had no additional questions.

## 2018-07-09 DIAGNOSIS — R531 Weakness: Secondary | ICD-10-CM | POA: Diagnosis not present

## 2018-07-09 DIAGNOSIS — K210 Gastro-esophageal reflux disease with esophagitis: Secondary | ICD-10-CM | POA: Diagnosis not present

## 2018-07-09 DIAGNOSIS — M25552 Pain in left hip: Secondary | ICD-10-CM | POA: Diagnosis not present

## 2018-07-13 ENCOUNTER — Encounter: Payer: Self-pay | Admitting: Pharmacist

## 2018-07-13 ENCOUNTER — Ambulatory Visit (INDEPENDENT_AMBULATORY_CARE_PROVIDER_SITE_OTHER): Payer: Medicare Other | Admitting: Pharmacist

## 2018-07-13 VITALS — BP 112/62 | HR 63

## 2018-07-13 DIAGNOSIS — I10 Essential (primary) hypertension: Secondary | ICD-10-CM | POA: Diagnosis not present

## 2018-07-13 NOTE — Patient Instructions (Addendum)
Continue to check pressures regularly. If frequency of pressures below 110 for the top number increase please call the clinic [phone].  Continue medications as prescribed.   Check your pressure before you take your medications. IF pressure is less than 130 for the top number do not take your hydralazine for that dose. Recheck pressures before your next dose is due, IF pressure is above 130 take dose of hydralazine as scheduled.

## 2018-07-13 NOTE — Progress Notes (Signed)
Patient ID: [name]                 DOB: 12-26-1929                      MRN: [id number]     HPI: [name] is a 84 y.o. female patient of Dr. Caryl Comes to HTN clinic for follow up.PMH is significant for chronic afib and HTN.She has previously been seen in HTN clinic. Her management has been complicated by intolerances and tenuous pressures. She has previously needed reeducation on appropriate blood pressures. She was referred back to HTN clinic due to feeling poorly and experiencing side effects.   Pt presents to clinic today assisted by a cane and seems to be unsteady today on her feet. She states she has been dizzy and off balance. She states that pressure last night was 'good' in the 150s/80s. She has been back in therapy and been active with house work recently. She believes that she has been overdoing it recently taking care of the home and her husband. She states that her pressure dropped to systolic 92 last week. After rest it increased back to 130 over several hours. She states that she feels very poorly if her pressure is below 628B systolic. When her pressure is this low she feels tired, dizzy and weak.   Current HTN meds:  Irbesartan 300mg  daily at dinner Furosemide 20mg  daily  Carvedilol 25mg  BID - skips rarely due to low pressures  Hydralazine 12.5mg  BID  Previously tried:HCTZ, amlodipine (coughing at higher doses), doxazosin (dizziness), hydralazine (BP not optimized), clonidine (side effects), carvedilol (felt poorly)  BP goal: <140/57mmHggiven age  Family History: Mother - diabetes. Father - heart disease. Sister - hypertension. Brother - hypertension  Social History: No alcohol, no tobacco, or illicit drug use   Home BP readings: high 150s/70-80s - does occasionally see numbers less than 151 systolic.   Wt Readings from Last 3 Encounters:  11/10/17 170 lb (77.1 kg)  10/21/17 171 lb (77.6 kg)  07/23/17 165 lb (74.8 kg)   BP Readings from Last 3 Encounters:    07/13/18 112/68  11/10/17 (!) 179/63  10/21/17 (!) 162/74   Pulse Readings from Last 3 Encounters:  07/13/18 63  11/10/17 62  10/21/17 78    Renal function: CrCl cannot be calculated (Patient's most recent lab result is older than the maximum 21 days allowed.).  Past Medical History:  Diagnosis Date  . Anxiety   . Aortic valve sclerosis   . Atrial fibrillation ([place])   . Basal cell carcinoma    hx; near eyes  . GERD (gastroesophageal reflux disease)   . H/O one miscarriage   . Hard of hearing    has hearing aides but does not wear   . History of hiatal hernia   . History of kidney stones   . Hyperlipidemia   . Hypertension   . OA (osteoarthritis)    "right pointer; right hip" (01/05/2017)  . Obesity   . Renal insufficiency   . SSS (sick sinus syndrome) ([place]) 01/05/2017   a. s/p MDT dual chamber PPM   . Tinnitus   . Varicose veins   . Wears glasses     Current Outpatient Medications on File Prior to Visit  Medication Sig Dispense Refill  . acetaminophen (TYLENOL) 500 MG tablet Take 500-1,000 mg by mouth every 6 (six) hours as needed for headache (pain).    Marland Kitchen ALPRAZolam (XANAX) 0.5 MG tablet  Take 0.25 mg by mouth at bedtime.     Marland Kitchen amiodarone (PACERONE) 100 MG tablet TAKE 1 TABLET(100 MG) BY MOUTH DAILY 90 tablet 1  . antiseptic oral rinse (BIOTENE) LIQD 15 mLs by Mouth Rinse route as needed for dry mouth.    . Biotin 5000 MCG TABS Take 5,000 mcg by mouth See admin instructions. Take 1 tablet (5000 mcg) by mouth every other day at noon    . busPIRone (BUSPAR) 5 MG tablet Take 5 mg by mouth 2 (two) times daily.    . carvedilol (COREG) 25 MG tablet Take 1 tablet (25 mg total) 2 (two) times daily with a meal by mouth. 60 tablet 11  . furosemide (LASIX) 20 MG tablet TAKE 1 TABLET(20 MG) BY MOUTH DAILY 90 tablet 2  . hydrALAZINE (APRESOLINE) 25 MG tablet Take 0.5 tablets (12.5 mg total) by mouth 2 (two) times daily. 90 tablet 3  . irbesartan (AVAPRO) 300 MG tablet Take 1  tablet (300 mg total) by mouth daily. Please make yearly appt with Dr. Caryl Comes for December for future refills. 1st attempt 90 tablet 1  . Magnesium 500 MG CAPS Take 500 mg by mouth See admin instructions. Take 1 capsule (500 mg) by mouth every other day - before supper    . Multiple Vitamin (MULTIVITAMIN WITH MINERALS) TABS tablet Take 1 tablet by mouth daily.    Vladimir Faster Glycol-Propyl Glycol (SYSTANE OP) Place 1 drop into both eyes at bedtime as needed (dry eyes).    Alveda Reasons 15 MG TABS tablet TAKE 1 TABLET(15 MG) BY MOUTH DAILY WITH SUPPER 30 tablet 6  . pyridOXINE (VITAMIN B-6) 100 MG tablet Take 100 mg by mouth every other day.     No current facility-administered medications on file prior to visit.     Allergies  Allergen Reactions  . Celebrex [Celecoxib] Other (See Comments)    Reflux  . Codeine Nausea Only  . Ace Inhibitors Cough  . Norvasc [Amlodipine Besylate] Cough    Higher doses = VERY BAD COUGHING   . Vioxx [Rofecoxib] Other (See Comments)    Pt unsure of reaction    Blood pressure 112/68, pulse 63.   Assessment/Plan:  1. Hypertension: BP today is ok. However, she feels poorly with systolic pressures in the 110s or below. Pressure did not drop upon standing indicating that she is not necessarily orthostatic hypotensive. Will allow her pressure to run a little higher within her goal range of <140/90. Will allow her to skip her hydralazine if pressures are at goal to hopefully decrease fluctuations in pressures (and the feelings of low). Have recommended she keep a twice daily log of pressures, how she is feeling and if she takes her dose of hydralazine. She will bring this back in 3-4 weeks so that we can determine if medication adjustments are warranted.   Thank you, Lelan Pons. Patterson Hammersmith, [facility]  07/13/2018 9:36 AM

## 2018-07-19 ENCOUNTER — Ambulatory Visit (INDEPENDENT_AMBULATORY_CARE_PROVIDER_SITE_OTHER): Payer: Medicare Other | Admitting: *Deleted

## 2018-07-19 ENCOUNTER — Telehealth: Payer: Self-pay

## 2018-07-19 DIAGNOSIS — I495 Sick sinus syndrome: Secondary | ICD-10-CM

## 2018-07-19 NOTE — Telephone Encounter (Signed)
Spoke with pt and reminded pt of remote transmission that is due today. Pt verbalized understanding.   

## 2018-07-20 ENCOUNTER — Encounter: Payer: Self-pay | Admitting: Cardiology

## 2018-07-20 DIAGNOSIS — R7301 Impaired fasting glucose: Secondary | ICD-10-CM | POA: Diagnosis not present

## 2018-07-20 DIAGNOSIS — K219 Gastro-esophageal reflux disease without esophagitis: Secondary | ICD-10-CM | POA: Diagnosis not present

## 2018-07-20 DIAGNOSIS — E785 Hyperlipidemia, unspecified: Secondary | ICD-10-CM | POA: Diagnosis not present

## 2018-07-20 DIAGNOSIS — I10 Essential (primary) hypertension: Secondary | ICD-10-CM | POA: Diagnosis not present

## 2018-07-20 NOTE — Progress Notes (Signed)
Remote pacemaker transmission.   

## 2018-07-21 ENCOUNTER — Other Ambulatory Visit: Payer: Self-pay | Admitting: Physician Assistant

## 2018-07-21 DIAGNOSIS — R5381 Other malaise: Secondary | ICD-10-CM

## 2018-07-23 DIAGNOSIS — M25552 Pain in left hip: Secondary | ICD-10-CM | POA: Diagnosis not present

## 2018-07-23 DIAGNOSIS — R531 Weakness: Secondary | ICD-10-CM | POA: Diagnosis not present

## 2018-07-27 DIAGNOSIS — R531 Weakness: Secondary | ICD-10-CM | POA: Diagnosis not present

## 2018-07-27 DIAGNOSIS — M25552 Pain in left hip: Secondary | ICD-10-CM | POA: Diagnosis not present

## 2018-07-28 ENCOUNTER — Other Ambulatory Visit: Payer: Self-pay | Admitting: Family Medicine

## 2018-07-28 DIAGNOSIS — E2839 Other primary ovarian failure: Secondary | ICD-10-CM

## 2018-07-30 DIAGNOSIS — R531 Weakness: Secondary | ICD-10-CM | POA: Diagnosis not present

## 2018-07-30 DIAGNOSIS — M25552 Pain in left hip: Secondary | ICD-10-CM | POA: Diagnosis not present

## 2018-08-03 DIAGNOSIS — R7989 Other specified abnormal findings of blood chemistry: Secondary | ICD-10-CM | POA: Diagnosis not present

## 2018-08-10 ENCOUNTER — Ambulatory Visit: Payer: Medicare Other

## 2018-08-10 DIAGNOSIS — R531 Weakness: Secondary | ICD-10-CM | POA: Diagnosis not present

## 2018-08-10 DIAGNOSIS — M25552 Pain in left hip: Secondary | ICD-10-CM | POA: Diagnosis not present

## 2018-08-10 LAB — CUP PACEART REMOTE DEVICE CHECK
Battery Remaining Longevity: 106 mo
Brady Statistic AP VS Percent: 0.13 %
Brady Statistic AS VP Percent: 0.14 %
Brady Statistic RA Percent Paced: 99.68 %
Date Time Interrogation Session: 20190909172331
Implantable Lead Implant Date: 20180226
Implantable Lead Location: 753860
Implantable Lead Model: 5076
Implantable Lead Model: 5076
Implantable Pulse Generator Implant Date: 20180226
Lead Channel Impedance Value: 285 Ohm
Lead Channel Pacing Threshold Pulse Width: 0.4 ms
Lead Channel Pacing Threshold Pulse Width: 0.4 ms
Lead Channel Sensing Intrinsic Amplitude: 1.875 mV
Lead Channel Sensing Intrinsic Amplitude: 1.875 mV
Lead Channel Sensing Intrinsic Amplitude: 16.375 mV
Lead Channel Setting Pacing Pulse Width: 0.4 ms
MDC IDC LEAD IMPLANT DT: 20180226
MDC IDC LEAD LOCATION: 753859
MDC IDC MSMT BATTERY VOLTAGE: 2.98 V
MDC IDC MSMT LEADCHNL RA IMPEDANCE VALUE: 418 Ohm
MDC IDC MSMT LEADCHNL RA PACING THRESHOLD AMPLITUDE: 1.25 V
MDC IDC MSMT LEADCHNL RV IMPEDANCE VALUE: 418 Ohm
MDC IDC MSMT LEADCHNL RV IMPEDANCE VALUE: 513 Ohm
MDC IDC MSMT LEADCHNL RV PACING THRESHOLD AMPLITUDE: 0.625 V
MDC IDC MSMT LEADCHNL RV SENSING INTR AMPL: 16.375 mV
MDC IDC SET LEADCHNL RA PACING AMPLITUDE: 2.5 V
MDC IDC SET LEADCHNL RV PACING AMPLITUDE: 2.5 V
MDC IDC SET LEADCHNL RV SENSING SENSITIVITY: 2.8 mV
MDC IDC STAT BRADY AP VP PERCENT: 99.7 %
MDC IDC STAT BRADY AS VS PERCENT: 0.03 %
MDC IDC STAT BRADY RV PERCENT PACED: 99.83 %

## 2018-08-17 DIAGNOSIS — Z01419 Encounter for gynecological examination (general) (routine) without abnormal findings: Secondary | ICD-10-CM | POA: Diagnosis not present

## 2018-08-17 DIAGNOSIS — Z6828 Body mass index (BMI) 28.0-28.9, adult: Secondary | ICD-10-CM | POA: Diagnosis not present

## 2018-08-17 DIAGNOSIS — N958 Other specified menopausal and perimenopausal disorders: Secondary | ICD-10-CM | POA: Diagnosis not present

## 2018-08-17 DIAGNOSIS — Z1231 Encounter for screening mammogram for malignant neoplasm of breast: Secondary | ICD-10-CM | POA: Diagnosis not present

## 2018-08-23 DIAGNOSIS — I482 Chronic atrial fibrillation, unspecified: Secondary | ICD-10-CM | POA: Diagnosis not present

## 2018-08-23 DIAGNOSIS — Z23 Encounter for immunization: Secondary | ICD-10-CM | POA: Diagnosis not present

## 2018-08-23 DIAGNOSIS — I10 Essential (primary) hypertension: Secondary | ICD-10-CM | POA: Diagnosis not present

## 2018-08-23 DIAGNOSIS — Z0000 Encounter for general adult medical examination without abnormal findings: Secondary | ICD-10-CM | POA: Diagnosis not present

## 2018-08-24 ENCOUNTER — Ambulatory Visit (INDEPENDENT_AMBULATORY_CARE_PROVIDER_SITE_OTHER): Payer: Medicare Other | Admitting: Pharmacist

## 2018-08-24 VITALS — BP 112/62 | HR 66

## 2018-08-24 DIAGNOSIS — I10 Essential (primary) hypertension: Secondary | ICD-10-CM

## 2018-08-24 NOTE — Patient Instructions (Signed)
It was nice to see you today  Your blood pressure looks great today - your goal is < 140/23mmHg  If you notice that many of your top readings stay 150-160, you can increase your hydralazine from 1/4 to 1/2 tablet  Call Jinny Blossom or Georgina Peer with any concerns about your blood pressure [phone]

## 2018-08-24 NOTE — Progress Notes (Signed)
Patient ID: [name]                 DOB: 11-02-1930                      MRN: [id number]     HPI: [name] is a 84 y.o. female patient of [name] who presents today to HTN clinic for follow up.PMH is significant for chronic afib and HTN.She has previously been seen in HTN clinic. Her management has been complicated by intolerances and tenuous pressures. She was referred back to HTN clinic due to feeling poorly and experiencing side effects. She feels poorly if her systolic BP is 338S or below and her BP goal was increased to <140/103mmHg as a result.  Pt presents to clinic today in good spirits. She is using PT for her hip and remains active caring for her husband and with house work. She has been taking 1/4 tablet rather than 1/2 tablet of her hydralazine. She has a cane today however does not typically use this at home. Overall reports feeling well, however does have some lightheadedness if she changes positions quickly. She feels the best when her systolic BP is in the 505L. Reports her PCP [name] is ok with keeping her systolic BP 976-734L. She has skipped 1 dose of her hydralazine only one since her last visit when her BP was 121/63 (lowest reported home reading as well).  She has been checking her BP twice daily, at 8am and at 10pm. 8am readings mostly 140-150/80-90. 10pm readings mostly 140-150s/70-80s, a few 120-130s and 3 readings overall in the 937T systolic. She has seen her PCP a few times and reports her systolic BP was in the 024O during those visits. She exercised more today than normal and thinks that may be contributing to her clinic BP being lower than all of her home readings. She also feels more relaxed in the office than at home since she is social and likes to chat but does not typically get out of the house due to caring for her husband.  Current HTN meds:  Irbesartan 300mg  daily at dinner Furosemide 20mg  daily  Carvedilol 25mg  BID Hydralazine 12.5mg  BID - has  been taking 6.25mg  BID (cutting 25mg  tablet in quarters)  Previously tried:HCTZ, amlodipine (coughing at higher doses), doxazosin (dizziness), hydralazine (BP not optimized), clonidine (side effects), carvedilol (felt poorly)  Exercise: PT for her leg after hip surgery, water therapy. Walks and stays active caring for her husband.  Diet: Does not use much sodium in her food. Likes chicken, vegetables. Does not eat out at restaurants. Does not have caffeine, drinks mainly water.  BP goal: <140/8mmHggiven age and feelings of hypotension with SBP 110s or lower  Family History: Mother - diabetes. Father - heart disease. Sister - hypertension. Brother - hypertension  Social History: No alcohol, no tobacco, or illicit drug use   Home BP readings: high 150s/70-80s - does occasionally see numbers less than 973 systolic.   Wt Readings from Last 3 Encounters:  11/10/17 170 lb (77.1 kg)  10/21/17 171 lb (77.6 kg)  07/23/17 165 lb (74.8 kg)   BP Readings from Last 3 Encounters:  07/13/18 112/62  11/10/17 (!) 179/63  10/21/17 (!) 162/74   Pulse Readings from Last 3 Encounters:  07/13/18 63  11/10/17 62  10/21/17 78    Renal function: CrCl cannot be calculated (Patient's most recent lab result is older than the maximum 21 days allowed.).  Past Medical History:  Diagnosis Date  . Anxiety   . Aortic valve sclerosis   . Atrial fibrillation ([place])   . Basal cell carcinoma    hx; near eyes  . GERD (gastroesophageal reflux disease)   . H/O one miscarriage   . Hard of hearing    has hearing aides but does not wear   . History of hiatal hernia   . History of kidney stones   . Hyperlipidemia   . Hypertension   . OA (osteoarthritis)    "right pointer; right hip" (01/05/2017)  . Obesity   . Renal insufficiency   . SSS (sick sinus syndrome) ([place]) 01/05/2017   a. s/p MDT dual chamber PPM   . Tinnitus   . Varicose veins   . Wears glasses     Current Outpatient Medications on File  Prior to Visit  Medication Sig Dispense Refill  . acetaminophen (TYLENOL) 500 MG tablet Take 500-1,000 mg by mouth every 6 (six) hours as needed for headache (pain).    Marland Kitchen ALPRAZolam (XANAX) 0.5 MG tablet Take 0.25 mg by mouth at bedtime.     Marland Kitchen amiodarone (PACERONE) 100 MG tablet TAKE 1 TABLET(100 MG) BY MOUTH DAILY 90 tablet 1  . antiseptic oral rinse (BIOTENE) LIQD 15 mLs by Mouth Rinse route as needed for dry mouth.    . Biotin 5000 MCG TABS Take 5,000 mcg by mouth See admin instructions. Take 1 tablet (5000 mcg) by mouth every other day at noon    . busPIRone (BUSPAR) 5 MG tablet Take 5 mg by mouth 2 (two) times daily.    . carvedilol (COREG) 25 MG tablet Take 1 tablet (25 mg total) 2 (two) times daily with a meal by mouth. 60 tablet 11  . furosemide (LASIX) 20 MG tablet TAKE 1 TABLET(20 MG) BY MOUTH DAILY 90 tablet 2  . hydrALAZINE (APRESOLINE) 25 MG tablet Take 0.5 tablets (12.5 mg total) by mouth 2 (two) times daily. 90 tablet 3  . irbesartan (AVAPRO) 300 MG tablet Take 1 tablet (300 mg total) by mouth daily. Please make yearly appt with [name] for December for future refills. 1st attempt 90 tablet 1  . Magnesium 500 MG CAPS Take 500 mg by mouth See admin instructions. Take 1 capsule (500 mg) by mouth every other day - before supper    . Multiple Vitamin (MULTIVITAMIN WITH MINERALS) TABS tablet Take 1 tablet by mouth daily.    [name] Glycol-Propyl Glycol (SYSTANE OP) Place 1 drop into both eyes at bedtime as needed (dry eyes).    . pyridOXINE (VITAMIN B-6) 100 MG tablet Take 100 mg by mouth every other day.    [name] 15 MG TABS tablet TAKE 1 TABLET(15 MG) BY MOUTH DAILY WITH SUPPER 30 tablet 6   No current facility-administered medications on file prior to visit.     Allergies  Allergen Reactions  . Celebrex [Celecoxib] Other (See Comments)    Reflux  . Codeine Nausea Only  . Ace Inhibitors Cough  . Norvasc [Amlodipine Besylate] Cough    Higher doses = VERY BAD COUGHING    . Vioxx [Rofecoxib] Other (See Comments)    Pt unsure of reaction    There were no vitals taken for this visit.   Assessment/Plan:  1. Hypertension - BP at goal today in clinic, significantly lower than all home readings which range 884-166A systolic for the most part. BP goal of 140/52mmHg due to pt feeling poorly when systolic drops much lower  than this. Additional exercise today may have contributed to lower than normal reading. Will continue irbesartan 300mg  daily, carvedilol 25mg  BID, furosemide 20mg  daily, and hydralazine - pt has been taking 6.25mg  BID and cutting her 25mg  tablets in 1/4. Advised pt she can increase her dose to 1/2 tablet BID if she notices a significant number of home readings increasing to the 150s. She will call clinic with any concerns and prefers to monitor her BP at home. F/u in HTN clinic as needed.   Kolbey Teichert E. Manveer Gomes, PharmD, BCACP, [place] [address], [place], [place] [address] Phone: [phone]; Fax: [phone] 08/24/2018 4:10 PM

## 2018-09-21 DIAGNOSIS — I10 Essential (primary) hypertension: Secondary | ICD-10-CM | POA: Diagnosis not present

## 2018-09-21 DIAGNOSIS — F419 Anxiety disorder, unspecified: Secondary | ICD-10-CM | POA: Diagnosis not present

## 2018-09-21 DIAGNOSIS — N183 Chronic kidney disease, stage 3 (moderate): Secondary | ICD-10-CM | POA: Diagnosis not present

## 2018-09-25 ENCOUNTER — Other Ambulatory Visit: Payer: Self-pay | Admitting: Nurse Practitioner

## 2018-09-28 ENCOUNTER — Other Ambulatory Visit: Payer: Self-pay | Admitting: Internal Medicine

## 2018-09-28 MED ORDER — CARVEDILOL 25 MG PO TABS: 25.0000 mg | ORAL_TABLET | Freq: Two times a day (BID) | ORAL | 0 refills | Status: DC

## 2018-09-28 NOTE — Telephone Encounter (Signed)
Outpatient Medication Detail    Disp Refills Start End   carvedilol (COREG) 25 MG tablet 180 tablet 0 09/28/2018    Sig - Route: Take 1 tablet (25 mg total) by mouth 2 (two) times daily with a meal. Please keep upcoming appt in December for future refills. Thank you - Oral   Sent to pharmacy as: carvedilol (COREG) 25 MG tablet   Notes to Pharmacy: Pt must keep upcoming appointment in December for future refills. Thank you   E-Prescribing Status: Receipt confirmed by pharmacy (09/28/2018 9:20 AM EST)   [name], [place]

## 2018-10-12 ENCOUNTER — Encounter: Payer: Self-pay | Admitting: Internal Medicine

## 2018-10-12 ENCOUNTER — Ambulatory Visit: Payer: Medicare Other | Admitting: Internal Medicine

## 2018-10-12 VITALS — BP 132/84 | HR 75 | Ht 64.0 in | Wt 171.4 lb

## 2018-10-12 DIAGNOSIS — I480 Paroxysmal atrial fibrillation: Secondary | ICD-10-CM

## 2018-10-12 DIAGNOSIS — Z950 Presence of cardiac pacemaker: Secondary | ICD-10-CM | POA: Diagnosis not present

## 2018-10-12 DIAGNOSIS — I495 Sick sinus syndrome: Secondary | ICD-10-CM | POA: Diagnosis not present

## 2018-10-12 MED ORDER — IRBESARTAN 300 MG PO TABS: 150.0000 mg | ORAL_TABLET | Freq: Every day | ORAL | 3 refills | Status: DC

## 2018-10-12 NOTE — Progress Notes (Signed)
Patient Care Team: Chesley Noon, MD as PCP - General (Family Medicine)   HPI  [name] is a 84 y.o. female Seen for pacemaker follow-up with MR  She underwent device implantation by Dr. Carlyn Reichert 2/18 for symptomatic sinus node dysfunction.  Has paroxysmal atrial fibrillation for which she takes amiodarone and Rivaroxaban.  She wanted an older doctor.  She had atrial fibrillation with CVR-- cardioverted 9/17.    She denies intercurrent afib. Taking amiodarone without symptoms of  Amio toxicity   Having problems with dizziness.  Particularly upon standing.  Also with rotation of head.  No exertional chest pain or shortness of breath.  Some fleeting sharp chest pain.  Some variable fatigue.  No obvious bleeding   Date TSH LFTs Cr K Hgb  1/18  1.14      14  5/18    1.63    8/18 1.24(12/18)  - 21 1.47  11.8 (4/18)  1/19  37 1.00 3.4 11.3      DATE TEST    2/18 Echo    EF 60-65 % Eccentric MR                  Past Medical History:  Diagnosis Date  . Anxiety   . Aortic valve sclerosis   . Atrial fibrillation ([place])   . Basal cell carcinoma    hx; near eyes  . GERD (gastroesophageal reflux disease)   . H/O one miscarriage   . Hard of hearing    has hearing aides but does not wear   . History of hiatal hernia   . History of kidney stones   . Hyperlipidemia   . Hypertension   . OA (osteoarthritis)    "right pointer; right hip" (01/05/2017)  . Obesity   . Renal insufficiency   . SSS (sick sinus syndrome) ([place]) 01/05/2017   a. s/p MDT dual chamber PPM   . Tinnitus   . Varicose veins   . Wears glasses     Past Surgical History:  Procedure Laterality Date  . BREAST CYST EXCISION Right 1977   "benign"  . CARDIOVERSION N/A 07/25/2016   Procedure: CARDIOVERSION;  Surgeon: Lelon Perla, MD;  Location: [facility];  Service: Cardiovascular;  Laterality: N/A;  . CATARACT EXTRACTION W/ INTRAOCULAR LENS  IMPLANT, BILATERAL Bilateral   . DILATION AND  CURETTAGE OF UTERUS    . NASAL SEPTUM SURGERY    . OVARIAN CYST REMOVAL  2002  . PACEMAKER IMPLANT N/A 01/05/2017   Procedure: Pacemaker Implant;  Surgeon: Will Meredith Leeds, MD;  Location: [facility];  Service: Cardiovascular;  Laterality: N/A;  . TEE WITHOUT CARDIOVERSION N/A 12/16/2016   Procedure: TRANSESOPHAGEAL ECHOCARDIOGRAM (TEE);  Surgeon: Larey Dresser, MD;  Location: [facility];  Service: Cardiovascular;  Laterality: N/A;  . TONSILLECTOMY    . TOTAL HIP ARTHROPLASTY Left 12/18/2015   Procedure: LEFT TOTAL HIP ARTHROPLASTY ANTERIOR APPROACH;  Surgeon: Paralee Cancel, MD;  Location: [facility];  Service: Orthopedics;  Laterality: Left;    Current Outpatient Medications  Medication Sig Dispense Refill  . acetaminophen (TYLENOL) 500 MG tablet Take 500-1,000 mg by mouth every 6 (six) hours as needed for headache (pain).    Marland Kitchen ALPRAZolam (XANAX) 0.5 MG tablet Take 0.25 mg by mouth at bedtime.     Marland Kitchen amiodarone (PACERONE) 100 MG tablet TAKE 1 TABLET(100 MG) BY MOUTH DAILY 90 tablet 1  . antiseptic oral rinse (BIOTENE) LIQD 15 mLs by Mouth  Rinse route as needed for dry mouth.    . Biotin 5000 MCG TABS Take 5,000 mcg by mouth See admin instructions. Take 1 tablet (5000 mcg) by mouth every other day at noon    . busPIRone (BUSPAR) 5 MG tablet Take 5 mg by mouth 2 (two) times daily.    . carvedilol (COREG) 25 MG tablet Take 1 tablet (25 mg total) by mouth 2 (two) times daily with a meal. Please keep upcoming appt in December for future refills. Thank you 180 tablet 0  . furosemide (LASIX) 20 MG tablet TAKE 1 TABLET(20 MG) BY MOUTH DAILY 90 tablet 2  . hydrALAZINE (APRESOLINE) 25 MG tablet Take 0.5 tablets (12.5 mg total) by mouth 2 (two) times daily. 90 tablet 3  . irbesartan (AVAPRO) 300 MG tablet Take 1 tablet (300 mg total) by mouth daily. Please make yearly appt with Dr. Caryl Comes for December for future refills. 1st attempt 90 tablet 1  . Magnesium 500 MG CAPS Take 500 mg by mouth See admin  instructions. Take 1 capsule (500 mg) by mouth every other day - before supper    . Multiple Vitamin (MULTIVITAMIN WITH MINERALS) TABS tablet Take 1 tablet by mouth daily.    Marland Kitchen omeprazole (PRILOSEC) 40 MG capsule Take 40 mg by mouth daily.    Vladimir Faster Glycol-Propyl Glycol (SYSTANE OP) Place 1 drop into both eyes at bedtime as needed (dry eyes).    . pyridOXINE (VITAMIN B-6) 100 MG tablet Take 100 mg by mouth every other day.    . ranitidine (ZANTAC) 150 MG tablet Take 150 mg by mouth daily.    Alveda Reasons 15 MG TABS tablet TAKE 1 TABLET(15 MG) BY MOUTH DAILY WITH SUPPER 30 tablet 6   No current facility-administered medications for this visit.     Allergies  Allergen Reactions  . Celebrex [Celecoxib] Other (See Comments)    Reflux  . Codeine Nausea Only  . Ace Inhibitors Cough  . Norvasc [Amlodipine Besylate] Cough    Higher doses = VERY BAD COUGHING   . Vioxx [Rofecoxib] Other (See Comments)    Pt unsure of reaction      Review of Systems negative except from HPI and PMH  Physical Exam BP 132/84   Pulse 75   Ht 5\' 4"  (1.626 m)   Wt 171 lb 6.4 oz (77.7 kg)   SpO2 97%   BMI 29.42 kg/m  Well developed and nourished in no acute distress HENT normal Neck supple with JVP-flat Clear Regular rate and rhythm,decrease S1 2/6  Murmur  Abd-soft with active BS No Clubbing cyanosis edema Skin-warm and dry A & Oriented  Grossly normal sensory and motor function   ECG AV pacing with fusion   Assessment and  Plan  Atrial fibrillation  Sinus node dysfunction  Pacemaker MRI Medtronic The patient's device was interrogated.  The information was reviewed. No changes were made in the programming.     renal failure grade 3  MR moderate  1AVB-profound   High Risk Medication Surveillance  Stressful home situation  Subclinical Atrial fibrillation present   Blood pressure relatively low.  Symptoms of orthostasis without demonstrable orthostatic hypotension.  We will  discontinue her low-dose hydralazine and have her cut her irbesartan in half  On Anticoagulation;  No bleeding issues   amio surveillance labs today   We spent more than 50% of our >25 min visit in face to face counseling regarding the above

## 2018-10-12 NOTE — Patient Instructions (Signed)
Medication Instructions:  Your physician has recommended you make the following change in your medication:   1. Stop Hydralazine 2. Decrease you Irbesartan to 150mg , half tablet, once daily before bedtime.  Labwork: You will have labs drawn today: CMET, CBC and TSH  Testing/Procedures: None ordered.  Follow-Up: Your physician recommends that you schedule a follow-up appointment in:   6 months with Chanetta Marshall, NP  Remote monitoring is used to monitor your Pacemaker from home. This monitoring reduces the number of office visits required to check your device to one time per year. It allows [place] to keep an eye on the functioning of your device to ensure it is working properly. You are scheduled for a device check from home on 10/18/2018. You may send your transmission at any time that day. If you have a wireless device, the transmission will be sent automatically. After your physician reviews your transmission, you will receive a postcard with your next transmission date.    Any Other Special Instructions Will Be Listed Below (If Applicable).     If you need a refill on your cardiac medications before your next appointment, please call your pharmacy.

## 2018-10-13 LAB — CBC
Hematocrit: 39.3 % (ref 34.0–46.6)
Hemoglobin: 13 g/dL (ref 11.1–15.9)
MCH: 31.2 pg (ref 26.6–33.0)
MCHC: 33.1 g/dL (ref 31.5–35.7)
MCV: 94 fL (ref 79–97)
Platelets: 232 10*3/uL (ref 150–450)
RBC: 4.17 x10E6/uL (ref 3.77–5.28)
RDW: 11.8 % — AB (ref 12.3–15.4)
WBC: 5 10*3/uL (ref 3.4–10.8)

## 2018-10-13 LAB — COMPREHENSIVE METABOLIC PANEL
A/G RATIO: 1.8 (ref 1.2–2.2)
ALK PHOS: 85 IU/L (ref 39–117)
ALT: 14 IU/L (ref 0–32)
AST: 19 IU/L (ref 0–40)
Albumin: 4.3 g/dL (ref 3.5–4.7)
BILIRUBIN TOTAL: 0.5 mg/dL (ref 0.0–1.2)
BUN/Creatinine Ratio: 17 (ref 12–28)
BUN: 25 mg/dL (ref 8–27)
CALCIUM: 9.6 mg/dL (ref 8.7–10.3)
CHLORIDE: 103 mmol/L (ref 96–106)
CO2: 26 mmol/L (ref 20–29)
Creatinine, Ser: 1.51 mg/dL — ABNORMAL HIGH (ref 0.57–1.00)
GFR calc Af Amer: 36 mL/min/{1.73_m2} — ABNORMAL LOW (ref >59)
GFR, EST NON AFRICAN AMERICAN: 31 mL/min/{1.73_m2} — AB (ref >59)
GLOBULIN, TOTAL: 2.4 g/dL (ref 1.5–4.5)
Glucose: 91 mg/dL (ref 65–99)
POTASSIUM: 4.8 mmol/L (ref 3.5–5.2)
SODIUM: 143 mmol/L (ref 134–144)
Total Protein: 6.7 g/dL (ref 6.0–8.5)

## 2018-10-13 LAB — TSH: TSH: 1.65 u[IU]/mL (ref 0.450–4.500)

## 2018-10-18 ENCOUNTER — Ambulatory Visit (INDEPENDENT_AMBULATORY_CARE_PROVIDER_SITE_OTHER): Payer: Medicare Other

## 2018-10-18 DIAGNOSIS — I495 Sick sinus syndrome: Secondary | ICD-10-CM | POA: Diagnosis not present

## 2018-10-18 DIAGNOSIS — R001 Bradycardia, unspecified: Secondary | ICD-10-CM

## 2018-10-20 NOTE — Progress Notes (Signed)
Remote pacemaker transmission.   

## 2018-10-25 ENCOUNTER — Telehealth: Payer: Self-pay

## 2018-10-25 ENCOUNTER — Other Ambulatory Visit: Payer: Self-pay | Admitting: Internal Medicine

## 2018-10-25 DIAGNOSIS — R7989 Other specified abnormal findings of blood chemistry: Secondary | ICD-10-CM

## 2018-10-25 NOTE — Telephone Encounter (Signed)
LMTCB

## 2018-10-25 NOTE — Telephone Encounter (Signed)
-----   Message from Deboraha Sprang, MD sent at 10/25/2018  8:58 AM EST ----- Please Inform Patient  Labs are normal x interval change in renal function  Value is about what it was a year ago and which had normalized  Rivaroxaban dose is appropriate  Lets check again in about 4 weeks   Thanks

## 2018-10-26 DIAGNOSIS — I482 Chronic atrial fibrillation, unspecified: Secondary | ICD-10-CM | POA: Diagnosis not present

## 2018-10-26 DIAGNOSIS — I10 Essential (primary) hypertension: Secondary | ICD-10-CM | POA: Diagnosis not present

## 2018-10-26 DIAGNOSIS — N183 Chronic kidney disease, stage 3 (moderate): Secondary | ICD-10-CM | POA: Diagnosis not present

## 2018-10-26 DIAGNOSIS — E7800 Pure hypercholesterolemia, unspecified: Secondary | ICD-10-CM | POA: Diagnosis not present

## 2018-10-28 NOTE — Telephone Encounter (Signed)
-----   Message from Deboraha Sprang, MD sent at 10/25/2018  8:58 AM EST ----- Please Inform Patient  Labs are normal x interval change in renal function  Value is about what it was a year ago and which had normalized  Rivaroxaban dose is appropriate  Lets check again in about 4 weeks   Thanks

## 2018-10-28 NOTE — Telephone Encounter (Signed)
Spoke with pt regarding her labs. She agrees to come in on 1/28 for a repeat BMP. She had no additional questions.

## 2018-10-30 ENCOUNTER — Other Ambulatory Visit: Payer: Self-pay | Admitting: Internal Medicine

## 2018-11-01 ENCOUNTER — Other Ambulatory Visit: Payer: Self-pay | Admitting: Internal Medicine

## 2018-11-01 NOTE — Telephone Encounter (Signed)
Pt last saw Dr Caryl Comes 10/12/18, last labs 10/12/18 Creat 1.51, age 84, weight 77.7kg, CrCl 32.8, based on CrCl pt is on appropriate dosage of Xarelto 15mg  QD.  Will refill rx.

## 2018-12-02 LAB — CUP PACEART REMOTE DEVICE CHECK
Battery Remaining Longevity: 101 mo
Battery Voltage: 2.98 V
Brady Statistic AP VP Percent: 99.52 %
Brady Statistic AP VS Percent: 0.37 %
Brady Statistic AS VP Percent: 0.06 %
Brady Statistic AS VS Percent: 0.05 %
Brady Statistic RA Percent Paced: 99.92 %
Brady Statistic RV Percent Paced: 99.58 %
Date Time Interrogation Session: 20191209055931
Implantable Lead Implant Date: 20180226
Implantable Lead Location: 753859
Implantable Lead Location: 753860
Implantable Lead Model: 5076
Implantable Lead Model: 5076
Implantable Pulse Generator Implant Date: 20180226
Lead Channel Impedance Value: 304 Ohm
Lead Channel Impedance Value: 418 Ohm
Lead Channel Impedance Value: 418 Ohm
Lead Channel Pacing Threshold Amplitude: 0.625 V
Lead Channel Pacing Threshold Amplitude: 1.25 V
Lead Channel Pacing Threshold Pulse Width: 0.4 ms
Lead Channel Pacing Threshold Pulse Width: 0.4 ms
Lead Channel Sensing Intrinsic Amplitude: 17.375 mV
Lead Channel Sensing Intrinsic Amplitude: 17.375 mV
Lead Channel Sensing Intrinsic Amplitude: 3.25 mV
Lead Channel Setting Pacing Amplitude: 2.5 V
Lead Channel Setting Pacing Amplitude: 2.5 V
Lead Channel Setting Pacing Pulse Width: 0.4 ms
MDC IDC LEAD IMPLANT DT: 20180226
MDC IDC MSMT LEADCHNL RA SENSING INTR AMPL: 3.25 mV
MDC IDC MSMT LEADCHNL RV IMPEDANCE VALUE: 494 Ohm
MDC IDC SET LEADCHNL RV SENSING SENSITIVITY: 2.8 mV

## 2018-12-07 ENCOUNTER — Other Ambulatory Visit: Payer: Medicare Other

## 2018-12-07 DIAGNOSIS — R7989 Other specified abnormal findings of blood chemistry: Secondary | ICD-10-CM

## 2018-12-08 LAB — BASIC METABOLIC PANEL
BUN/Creatinine Ratio: 17 (ref 12–28)
BUN: 19 mg/dL (ref 8–27)
CO2: 24 mmol/L (ref 20–29)
CREATININE: 1.15 mg/dL — AB (ref 0.57–1.00)
Calcium: 9.9 mg/dL (ref 8.7–10.3)
Chloride: 98 mmol/L (ref 96–106)
GFR calc Af Amer: 50 mL/min/{1.73_m2} — ABNORMAL LOW (ref >59)
GFR calc non Af Amer: 43 mL/min/{1.73_m2} — ABNORMAL LOW (ref >59)
Glucose: 163 mg/dL — ABNORMAL HIGH (ref 65–99)
Potassium: 4.2 mmol/L (ref 3.5–5.2)
Sodium: 138 mmol/L (ref 134–144)

## 2019-01-11 ENCOUNTER — Ambulatory Visit (INDEPENDENT_AMBULATORY_CARE_PROVIDER_SITE_OTHER): Payer: Medicare Other | Admitting: Internal Medicine

## 2019-01-11 ENCOUNTER — Encounter: Payer: Self-pay | Admitting: Internal Medicine

## 2019-01-11 VITALS — BP 130/76 | HR 76 | Ht 64.0 in | Wt 174.4 lb

## 2019-01-11 DIAGNOSIS — I495 Sick sinus syndrome: Secondary | ICD-10-CM | POA: Diagnosis not present

## 2019-01-11 DIAGNOSIS — I480 Paroxysmal atrial fibrillation: Secondary | ICD-10-CM

## 2019-01-11 DIAGNOSIS — I10 Essential (primary) hypertension: Secondary | ICD-10-CM

## 2019-01-11 DIAGNOSIS — Z950 Presence of cardiac pacemaker: Secondary | ICD-10-CM | POA: Diagnosis not present

## 2019-01-11 DIAGNOSIS — Z79899 Other long term (current) drug therapy: Secondary | ICD-10-CM

## 2019-01-11 NOTE — H&P (View-Only) (Signed)
Patient Care Team: Chesley Noon, MD as PCP - General (Family Medicine)   HPI  [name] is a 85 y.o. female Seen for pacemaker follow-up with MR  She underwent device implantation by Dr. Carlyn Reichert 2/18 for symptomatic sinus node dysfunction.  Has paroxysmal atrial fibrillation for which she takes amiodarone and Rivaroxaban.  She wanted an older doctor.  She had atrial fibrillation with CVR-- cardioverted 9/17.    She denies intercurrent afib. Taking amiodarone without symptoms of  Amio toxicity   Having problems with dizziness.  Particularly upon standing.  Also with rotation of head.  No exertional chest pain or shortness of breath.  Some fleeting sharp chest pain.  Some variable fatigue.  No obvious bleeding   Date TSH LFTs Cr K Hgb  1/18  1.14      14  5/18    1.63    8/18 1.24(12/18)  - 21 1.47  11.8 (4/18)  1/19  37 1.00 3.4 11.3      DATE TEST    2/18 Echo    EF 60-65 % Eccentric MR                  Past Medical History:  Diagnosis Date  . Anxiety   . Aortic valve sclerosis   . Atrial fibrillation ([place])   . Basal cell carcinoma    hx; near eyes  . GERD (gastroesophageal reflux disease)   . H/O one miscarriage   . Hard of hearing    has hearing aides but does not wear   . History of hiatal hernia   . History of kidney stones   . Hyperlipidemia   . Hypertension   . OA (osteoarthritis)    "right pointer; right hip" (01/05/2017)  . Obesity   . Renal insufficiency   . SSS (sick sinus syndrome) ([place]) 01/05/2017   a. s/p MDT dual chamber PPM   . Tinnitus   . Varicose veins   . Wears glasses     Past Surgical History:  Procedure Laterality Date  . BREAST CYST EXCISION Right 1977   "benign"  . CARDIOVERSION N/A 07/25/2016   Procedure: CARDIOVERSION;  Surgeon: Lelon Perla, MD;  [facility] ENDOSCOPY;  Service: Cardiovascular;  Laterality: N/A;  . CATARACT EXTRACTION W/ INTRAOCULAR LENS  IMPLANT, BILATERAL Bilateral   . DILATION AND  CURETTAGE OF UTERUS    . NASAL SEPTUM SURGERY    . OVARIAN CYST REMOVAL  2002  . PACEMAKER IMPLANT N/A 01/05/2017   Procedure: Pacemaker Implant;  Surgeon: Will Meredith Leeds, MD;  Location: [place] CV LAB;  Service: Cardiovascular;  Laterality: N/A;  . TEE WITHOUT CARDIOVERSION N/A 12/16/2016   Procedure: TRANSESOPHAGEAL ECHOCARDIOGRAM (TEE);  Surgeon: Larey Dresser, MD;  Location: [place];  Service: Cardiovascular;  Laterality: N/A;  . TONSILLECTOMY    . TOTAL HIP ARTHROPLASTY Left 12/18/2015   Procedure: LEFT TOTAL HIP ARTHROPLASTY ANTERIOR APPROACH;  Surgeon: Paralee Cancel, MD;  Location: [facility] ORS;  Service: Orthopedics;  Laterality: Left;    Current Outpatient Medications  Medication Sig Dispense Refill  . acetaminophen (TYLENOL) 500 MG tablet Take 500-1,000 mg by mouth every 6 (six) hours as needed for headache (pain).    Marland Kitchen ALPRAZolam (XANAX) 0.5 MG tablet Take 0.25 mg by mouth at bedtime.     Marland Kitchen amiodarone (PACERONE) 100 MG tablet TAKE 1 TABLET(100 MG) BY MOUTH DAILY 90 tablet 3  . antiseptic oral rinse (BIOTENE) LIQD 15 mLs by Mouth  Rinse route as needed for dry mouth.    . Biotin 5000 MCG TABS Take 5,000 mcg by mouth See admin instructions. Take 1 tablet (5000 mcg) by mouth every other day at noon    . busPIRone (BUSPAR) 5 MG tablet Take 5 mg by mouth 2 (two) times daily.    . carvedilol (COREG) 25 MG tablet Take 1 tablet (25 mg total) by mouth 2 (two) times daily with a meal. Please keep upcoming appt in December for future refills. Thank you 180 tablet 0  . furosemide (LASIX) 20 MG tablet TAKE 1 TABLET(20 MG) BY MOUTH DAILY 90 tablet 2  . irbesartan (AVAPRO) 300 MG tablet Take 0.5 tablets (150 mg total) by mouth at bedtime. 45 tablet 3  . Magnesium 500 MG CAPS Take 500 mg by mouth See admin instructions. Take 1 capsule (500 mg) by mouth every other day - before supper    . Multiple Vitamin (MULTIVITAMIN WITH MINERALS) TABS tablet Take 1 tablet by mouth daily.    Marland Kitchen omeprazole  (PRILOSEC) 40 MG capsule Take 40 mg by mouth daily.    Vladimir Faster Glycol-Propyl Glycol (SYSTANE OP) Place 1 drop into both eyes at bedtime as needed (dry eyes).    . pyridOXINE (VITAMIN B-6) 100 MG tablet Take 100 mg by mouth every other day.    . ranitidine (ZANTAC) 150 MG tablet Take 150 mg by mouth daily.    Alveda Reasons 15 MG TABS tablet TAKE 1 TABLET(15 MG) BY MOUTH DAILY WITH SUPPER 30 tablet 6   No current facility-administered medications for this visit.     Allergies  Allergen Reactions  . Celebrex [Celecoxib] Other (See Comments)    Reflux  . Codeine Nausea Only  . Ace Inhibitors Cough  . Norvasc [Amlodipine Besylate] Cough    Higher doses = VERY BAD COUGHING   . Vioxx [Rofecoxib] Other (See Comments)    Pt unsure of reaction      Review of Systems negative except from HPI and PMH  Physical Exam BP 130/76   Pulse 76   Ht 5\' 4"  (1.626 m)   Wt 174 lb 6.4 oz (79.1 kg)   SpO2 93%   BMI 29.94 kg/m  Well developed and nourished in no acute distress HENT normal Neck supple with JVP-flat Clear Regular rate and rhythm,decrease S1 2/6  Murmur  Abd-soft with active BS No Clubbing cyanosis edema Skin-warm and dry A & Oriented  Grossly normal sensory and motor function   ECG &  Assessment and  Plan  Atrial fibrillation  Sinus node dysfunction  Pacemaker MRI Medtronic The patient's device was interrogated.  The information was reviewed. No changes were made in the programming.     renal failure grade 3  MR moderate  1AVB-profound   High Risk Medication Surveillance  Stressful home situation  Subclinical Atrial fibrillation present   Blood pressure relatively low.  Symptoms of orthostasis without demonstrable orthostatic hypotension.  We will discontinue her low-dose hydralazine and have her cut her irbesartan in half  On Anticoagulation;  No bleeding issues   amio surveillance labs today   We spent more than 50% of our >25 min visit in face to face  counseling regarding the above

## 2019-01-11 NOTE — Progress Notes (Signed)
Patient Care Team: Chesley Noon, MD as PCP - General (Family Medicine)   HPI  [name] is a 85 y.o. female Seen for pacemaker follow-up with MR  She underwent device implantation by Dr. Carlyn Reichert 2/18 for symptomatic sinus node dysfunction and high-grade heart block resulting in nearly 100% ventricular pacing has paroxysmal atrial fibrillation for which she takes amiodarone and Rivaroxaban.  She wanted an older doctor.  She had atrial fibrillation with CVR-- cardioverted 9/17.  Taking amiodarone without symptoms of  Amio toxicity   Comes in with breathlessness which has been worsening over recent weeks   Unaware of palpitations  Some edema  Patient denies symptoms of GI intolerance, sun sensitivity, neurological symptoms attributable to amiodarone.     Date TSH LFTs Cr K Hgb  1/18  1.14      14  5/18    1.63    8/18 1.24(12/18)  - 21 1.47  11.8 (4/18)  1/19  37 1.00 3.4 11.3  12/19 1.65 14 1.51>1.15 4.2 13.0             DATE TEST EF   2/18 Echo   60-65 % Eccentric MR-mild  12/18  Echo  55-60%             Past Medical History:  Diagnosis Date  . Anxiety   . Aortic valve sclerosis   . Atrial fibrillation ([place])   . Basal cell carcinoma    hx; near eyes  . GERD (gastroesophageal reflux disease)   . H/O one miscarriage   . Hard of hearing    has hearing aides but does not wear   . History of hiatal hernia   . History of kidney stones   . Hyperlipidemia   . Hypertension   . OA (osteoarthritis)    "right pointer; right hip" (01/05/2017)  . Obesity   . Renal insufficiency   . SSS (sick sinus syndrome) ([place]) 01/05/2017   a. s/p MDT dual chamber PPM   . Tinnitus   . Varicose veins   . Wears glasses     Past Surgical History:  Procedure Laterality Date  . BREAST CYST EXCISION Right 1977   "benign"  . CARDIOVERSION N/A 07/25/2016   Procedure: CARDIOVERSION;  Surgeon: Lelon Perla, MD;  Location: [facility] ENDOSCOPY;  Service: Cardiovascular;  Laterality:  N/A;  . CATARACT EXTRACTION W/ INTRAOCULAR LENS  IMPLANT, BILATERAL Bilateral   . DILATION AND CURETTAGE OF UTERUS    . NASAL SEPTUM SURGERY    . OVARIAN CYST REMOVAL  2002  . PACEMAKER IMPLANT N/A 01/05/2017   Procedure: Pacemaker Implant;  Surgeon: Will Meredith Leeds, MD;  Location: [place] CV LAB;  Service: Cardiovascular;  Laterality: N/A;  . TEE WITHOUT CARDIOVERSION N/A 12/16/2016   Procedure: TRANSESOPHAGEAL ECHOCARDIOGRAM (TEE);  Surgeon: Larey Dresser, MD;  Location: [place];  Service: Cardiovascular;  Laterality: N/A;  . TONSILLECTOMY    . TOTAL HIP ARTHROPLASTY Left 12/18/2015   Procedure: LEFT TOTAL HIP ARTHROPLASTY ANTERIOR APPROACH;  Surgeon: Paralee Cancel, MD;  Location: [facility];  Service: Orthopedics;  Laterality: Left;    Current Outpatient Medications  Medication Sig Dispense Refill  . acetaminophen (TYLENOL) 500 MG tablet Take 500-1,000 mg by mouth every 6 (six) hours as needed for headache (pain).    Marland Kitchen ALPRAZolam (XANAX) 0.5 MG tablet Take 0.25 mg by mouth at bedtime.     Marland Kitchen amiodarone (PACERONE) 100 MG tablet TAKE 1 TABLET(100 MG) BY MOUTH DAILY  90 tablet 3  . antiseptic oral rinse (BIOTENE) LIQD 15 mLs by Mouth Rinse route as needed for dry mouth.    . Biotin 5000 MCG TABS Take 5,000 mcg by mouth See admin instructions. Take 1 tablet (5000 mcg) by mouth every other day at noon    . busPIRone (BUSPAR) 5 MG tablet Take 5 mg by mouth 2 (two) times daily.    . carvedilol (COREG) 25 MG tablet Take 1 tablet (25 mg total) by mouth 2 (two) times daily with a meal. Please keep upcoming appt in December for future refills. Thank you 180 tablet 0  . furosemide (LASIX) 20 MG tablet TAKE 1 TABLET(20 MG) BY MOUTH DAILY 90 tablet 2  . irbesartan (AVAPRO) 300 MG tablet Take 0.5 tablets (150 mg total) by mouth at bedtime. 45 tablet 3  . Magnesium 500 MG CAPS Take 500 mg by mouth See admin instructions. Take 1 capsule (500 mg) by mouth every other day - before supper    . Multiple  Vitamin (MULTIVITAMIN WITH MINERALS) TABS tablet Take 1 tablet by mouth daily.    Marland Kitchen omeprazole (PRILOSEC) 40 MG capsule Take 40 mg by mouth daily.    Vladimir Faster Glycol-Propyl Glycol (SYSTANE OP) Place 1 drop into both eyes at bedtime as needed (dry eyes).    . pyridOXINE (VITAMIN B-6) 100 MG tablet Take 100 mg by mouth every other day.    . ranitidine (ZANTAC) 150 MG tablet Take 150 mg by mouth daily.    Alveda Reasons 15 MG TABS tablet TAKE 1 TABLET(15 MG) BY MOUTH DAILY WITH SUPPER 30 tablet 6   No current facility-administered medications for this visit.         Review of Systems negative except from HPI and PMH  Physical Exam BP 130/76   Pulse 76   Ht 5\' 4"  (1.626 m)   Wt 174 lb 6.4 oz (79.1 kg)   SpO2 93%   BMI 29.94 kg/m  Well developed and well nourished in no acute distress HENT normal Neck supple with JVP-flat Clear Device pocket well healed; without hematoma or erythema.  There is no tethering  Regular rate and rhythm, no  gallop 2/6 murmur Abd-soft with active BS No Clubbing cyanosis   edema Skin-warm and dry A & Oriented  Grossly normal sensory and motor function   ECG  Afib with Vpacing at 76 -/21/49n   Assessment and  Plan  Atrial fibrillation  Sinus node dysfunction  Pacemaker MRI Medtronic The patient's device was interrogated.  The information was reviewed. No changes were made in the programming.     renal failure grade 3  MR moderate  1AVB-profound   High Risk Medication Surveillance  Stressful home situation    Persistent A. fib with symptoms of shortness of breath and exertional dyspnea with fatigue.  We will undertake cardioversion.  Contributing alternative explanations could be aggressive LV dysfunction from high-density ventricular pacing, amiodarone lung effects.  We will need to reassess functional status following restoration of sinus rhythm.  If she is not back to her baseline, with undertake an echo  Blood pressure is much  improved.  On Anticoagulation;  No bleeding issues    We spent more than 50% of our >25 min visit in face to face counseling regarding the above

## 2019-01-11 NOTE — Progress Notes (Signed)
Patient Care Team: Chesley Noon, MD as PCP - General (Family Medicine)   HPI  [name] is a 85 y.o. female Seen for pacemaker follow-up with MR  She underwent device implantation by Dr. Carlyn Reichert 2/18 for symptomatic sinus node dysfunction.  Has paroxysmal atrial fibrillation for which she takes amiodarone and Rivaroxaban.  She wanted an older doctor.  She had atrial fibrillation with CVR-- cardioverted 9/17.    She denies intercurrent afib. Taking amiodarone without symptoms of  Amio toxicity   Having problems with dizziness.  Particularly upon standing.  Also with rotation of head.  No exertional chest pain or shortness of breath.  Some fleeting sharp chest pain.  Some variable fatigue.  No obvious bleeding   Date TSH LFTs Cr K Hgb  1/18  1.14      14  5/18    1.63    8/18 1.24(12/18)  - 21 1.47  11.8 (4/18)  1/19  37 1.00 3.4 11.3      DATE TEST    2/18 Echo    EF 60-65 % Eccentric MR                  Past Medical History:  Diagnosis Date  . Anxiety   . Aortic valve sclerosis   . Atrial fibrillation ([place])   . Basal cell carcinoma    hx; near eyes  . GERD (gastroesophageal reflux disease)   . H/O one miscarriage   . Hard of hearing    has hearing aides but does not wear   . History of hiatal hernia   . History of kidney stones   . Hyperlipidemia   . Hypertension   . OA (osteoarthritis)    "right pointer; right hip" (01/05/2017)  . Obesity   . Renal insufficiency   . SSS (sick sinus syndrome) ([place]) 01/05/2017   a. s/p MDT dual chamber PPM   . Tinnitus   . Varicose veins   . Wears glasses     Past Surgical History:  Procedure Laterality Date  . BREAST CYST EXCISION Right 1977   "benign"  . CARDIOVERSION N/A 07/25/2016   Procedure: CARDIOVERSION;  Surgeon: Lelon Perla, MD;  Location: [facility] ENDOSCOPY;  Service: Cardiovascular;  Laterality: N/A;  . CATARACT EXTRACTION W/ INTRAOCULAR LENS  IMPLANT, BILATERAL Bilateral   . DILATION AND  CURETTAGE OF UTERUS    . NASAL SEPTUM SURGERY    . OVARIAN CYST REMOVAL  2002  . PACEMAKER IMPLANT N/A 01/05/2017   Procedure: Pacemaker Implant;  Surgeon: Will Meredith Leeds, MD;  Location: [place] CV LAB;  Service: Cardiovascular;  Laterality: N/A;  . TEE WITHOUT CARDIOVERSION N/A 12/16/2016   Procedure: TRANSESOPHAGEAL ECHOCARDIOGRAM (TEE);  Surgeon: Larey Dresser, MD;  Location: [place];  Service: Cardiovascular;  Laterality: N/A;  . TONSILLECTOMY    . TOTAL HIP ARTHROPLASTY Left 12/18/2015   Procedure: LEFT TOTAL HIP ARTHROPLASTY ANTERIOR APPROACH;  Surgeon: Paralee Cancel, MD;  Location: [facility];  Service: Orthopedics;  Laterality: Left;    Current Outpatient Medications  Medication Sig Dispense Refill  . acetaminophen (TYLENOL) 500 MG tablet Take 500-1,000 mg by mouth every 6 (six) hours as needed for headache (pain).    Marland Kitchen ALPRAZolam (XANAX) 0.5 MG tablet Take 0.25 mg by mouth at bedtime.     Marland Kitchen amiodarone (PACERONE) 100 MG tablet TAKE 1 TABLET(100 MG) BY MOUTH DAILY 90 tablet 3  . antiseptic oral rinse (BIOTENE) LIQD 15 mLs by Mouth  Rinse route as needed for dry mouth.    . Biotin 5000 MCG TABS Take 5,000 mcg by mouth See admin instructions. Take 1 tablet (5000 mcg) by mouth every other day at noon    . busPIRone (BUSPAR) 5 MG tablet Take 5 mg by mouth 2 (two) times daily.    . carvedilol (COREG) 25 MG tablet Take 1 tablet (25 mg total) by mouth 2 (two) times daily with a meal. Please keep upcoming appt in December for future refills. Thank you 180 tablet 0  . furosemide (LASIX) 20 MG tablet TAKE 1 TABLET(20 MG) BY MOUTH DAILY 90 tablet 2  . irbesartan (AVAPRO) 300 MG tablet Take 0.5 tablets (150 mg total) by mouth at bedtime. 45 tablet 3  . Magnesium 500 MG CAPS Take 500 mg by mouth See admin instructions. Take 1 capsule (500 mg) by mouth every other day - before supper    . Multiple Vitamin (MULTIVITAMIN WITH MINERALS) TABS tablet Take 1 tablet by mouth daily.    Marland Kitchen omeprazole  (PRILOSEC) 40 MG capsule Take 40 mg by mouth daily.    Vladimir Faster Glycol-Propyl Glycol (SYSTANE OP) Place 1 drop into both eyes at bedtime as needed (dry eyes).    . pyridOXINE (VITAMIN B-6) 100 MG tablet Take 100 mg by mouth every other day.    . ranitidine (ZANTAC) 150 MG tablet Take 150 mg by mouth daily.    Alveda Reasons 15 MG TABS tablet TAKE 1 TABLET(15 MG) BY MOUTH DAILY WITH SUPPER 30 tablet 6   No current facility-administered medications for this visit.     Allergies  Allergen Reactions  . Celebrex [Celecoxib] Other (See Comments)    Reflux  . Codeine Nausea Only  . Ace Inhibitors Cough  . Norvasc [Amlodipine Besylate] Cough    Higher doses = VERY BAD COUGHING   . Vioxx [Rofecoxib] Other (See Comments)    Pt unsure of reaction      Review of Systems negative except from HPI and PMH  Physical Exam BP 130/76   Pulse 76   Ht 5\' 4"  (1.626 m)   Wt 174 lb 6.4 oz (79.1 kg)   SpO2 93%   BMI 29.94 kg/m  Well developed and nourished in no acute distress HENT normal Neck supple with JVP-flat Clear Regular rate and rhythm,decrease S1 2/6  Murmur  Abd-soft with active BS No Clubbing cyanosis edema Skin-warm and dry A & Oriented  Grossly normal sensory and motor function   ECG &  Assessment and  Plan  Atrial fibrillation  Sinus node dysfunction  Pacemaker MRI Medtronic The patient's device was interrogated.  The information was reviewed. No changes were made in the programming.     renal failure grade 3  MR moderate  1AVB-profound   High Risk Medication Surveillance  Stressful home situation  Subclinical Atrial fibrillation present   Blood pressure relatively low.  Symptoms of orthostasis without demonstrable orthostatic hypotension.  We will discontinue her low-dose hydralazine and have her cut her irbesartan in half  On Anticoagulation;  No bleeding issues   amio surveillance labs today   We spent more than 50% of our >25 min visit in face to face  counseling regarding the above

## 2019-01-11 NOTE — Patient Instructions (Addendum)
Medication Instructions:  Your physician recommends that you continue on your current medications as directed. Please refer to the Current Medication list given to you today.  Labwork: You will have labs drawn today: CBC and BMP  Testing/Procedures: Your physician has recommended that you have a Cardioversion (DCCV). Electrical Cardioversion uses a jolt of electricity to your heart either through paddles or wired patches attached to your chest. This is a controlled, usually prescheduled, procedure. Defibrillation is done under light anesthesia in the hospital, and you usually go home the day of the procedure. This is done to get your heart back into a normal rhythm. You are not awake for the procedure. Please see the instruction sheet given to you today.   Follow-Up: Your physician recommends that you schedule a follow-up appointment in:   4 weeks after March 10th with the A Fib clinic  6 months with Dr Caryl Comes  Any Other Special Instructions Will Be Listed Below (If Applicable).     If you need a refill on your cardiac medications before your next appointment, please call your pharmacy.

## 2019-01-12 ENCOUNTER — Other Ambulatory Visit: Payer: Self-pay | Admitting: Internal Medicine

## 2019-01-12 DIAGNOSIS — I480 Paroxysmal atrial fibrillation: Secondary | ICD-10-CM

## 2019-01-12 LAB — CBC
Hematocrit: 34.2 % (ref 34.0–46.6)
Hemoglobin: 11.7 g/dL (ref 11.1–15.9)
MCH: 30.9 pg (ref 26.6–33.0)
MCHC: 34.2 g/dL (ref 31.5–35.7)
MCV: 90 fL (ref 79–97)
Platelets: 250 10*3/uL (ref 150–450)
RBC: 3.79 x10E6/uL (ref 3.77–5.28)
RDW: 11.8 % (ref 11.7–15.4)
WBC: 5.9 10*3/uL (ref 3.4–10.8)

## 2019-01-12 LAB — BASIC METABOLIC PANEL
BUN/Creatinine Ratio: 17 (ref 12–28)
BUN: 28 mg/dL — ABNORMAL HIGH (ref 8–27)
CO2: 24 mmol/L (ref 20–29)
Calcium: 9.8 mg/dL (ref 8.7–10.3)
Chloride: 102 mmol/L (ref 96–106)
Creatinine, Ser: 1.68 mg/dL — ABNORMAL HIGH (ref 0.57–1.00)
GFR calc Af Amer: 31 mL/min/{1.73_m2} — ABNORMAL LOW (ref >59)
GFR, EST NON AFRICAN AMERICAN: 27 mL/min/{1.73_m2} — AB (ref >59)
Glucose: 112 mg/dL — ABNORMAL HIGH (ref 65–99)
Potassium: 4.5 mmol/L (ref 3.5–5.2)
Sodium: 139 mmol/L (ref 134–144)

## 2019-01-13 LAB — CUP PACEART INCLINIC DEVICE CHECK
Battery Remaining Longevity: 95 mo
Battery Voltage: 2.98 V
Brady Statistic AS VS Percent: 0.13 %
Brady Statistic RA Percent Paced: 76.4 %
Brady Statistic RV Percent Paced: 99.44 %
Date Time Interrogation Session: 20200303201322
Implantable Lead Implant Date: 20180226
Implantable Lead Implant Date: 20180226
Implantable Lead Location: 753860
Implantable Lead Model: 5076
Implantable Lead Model: 5076
Implantable Pulse Generator Implant Date: 20180226
Lead Channel Impedance Value: 285 Ohm
Lead Channel Impedance Value: 475 Ohm
Lead Channel Pacing Threshold Amplitude: 0.625 V
Lead Channel Pacing Threshold Amplitude: 1.25 V
Lead Channel Pacing Threshold Pulse Width: 0.4 ms
Lead Channel Pacing Threshold Pulse Width: 0.4 ms
Lead Channel Sensing Intrinsic Amplitude: 1.125 mV
Lead Channel Sensing Intrinsic Amplitude: 1.625 mV
Lead Channel Sensing Intrinsic Amplitude: 15 mV
Lead Channel Sensing Intrinsic Amplitude: 18.5 mV
Lead Channel Setting Pacing Amplitude: 2.5 V
Lead Channel Setting Pacing Amplitude: 2.5 V
Lead Channel Setting Pacing Pulse Width: 0.4 ms
Lead Channel Setting Sensing Sensitivity: 2.8 mV
MDC IDC LEAD LOCATION: 753859
MDC IDC MSMT LEADCHNL RA IMPEDANCE VALUE: 380 Ohm
MDC IDC MSMT LEADCHNL RV IMPEDANCE VALUE: 418 Ohm
MDC IDC STAT BRADY AP VP PERCENT: 99.38 %
MDC IDC STAT BRADY AP VS PERCENT: 0.43 %
MDC IDC STAT BRADY AS VP PERCENT: 0.07 %

## 2019-01-17 ENCOUNTER — Ambulatory Visit (INDEPENDENT_AMBULATORY_CARE_PROVIDER_SITE_OTHER): Payer: Medicare Other | Admitting: *Deleted

## 2019-01-17 DIAGNOSIS — I495 Sick sinus syndrome: Secondary | ICD-10-CM | POA: Diagnosis not present

## 2019-01-17 DIAGNOSIS — R001 Bradycardia, unspecified: Secondary | ICD-10-CM

## 2019-01-18 ENCOUNTER — Telehealth: Payer: Self-pay | Admitting: Cardiovascular Disease

## 2019-01-18 LAB — CUP PACEART REMOTE DEVICE CHECK
Battery Remaining Longevity: 94 mo
Battery Voltage: 2.98 V
Brady Statistic AP VP Percent: 0 %
Brady Statistic AS VP Percent: 0 %
Brady Statistic AS VS Percent: 0 %
Brady Statistic RA Percent Paced: 2.91 %
Brady Statistic RV Percent Paced: 99.55 %
Date Time Interrogation Session: 20200309055924
Implantable Lead Implant Date: 20180226
Implantable Lead Implant Date: 20180226
Implantable Lead Location: 753859
Implantable Lead Location: 753860
Implantable Lead Model: 5076
Implantable Lead Model: 5076
Implantable Pulse Generator Implant Date: 20180226
Lead Channel Impedance Value: 285 Ohm
Lead Channel Impedance Value: 361 Ohm
Lead Channel Impedance Value: 399 Ohm
Lead Channel Impedance Value: 456 Ohm
Lead Channel Pacing Threshold Amplitude: 0.625 V
Lead Channel Pacing Threshold Amplitude: 1.25 V
Lead Channel Pacing Threshold Pulse Width: 0.4 ms
Lead Channel Pacing Threshold Pulse Width: 0.4 ms
Lead Channel Sensing Intrinsic Amplitude: 1.625 mV
Lead Channel Sensing Intrinsic Amplitude: 15 mV
Lead Channel Sensing Intrinsic Amplitude: 18.5 mV
Lead Channel Setting Pacing Amplitude: 2.5 V
Lead Channel Setting Pacing Amplitude: 2.5 V
Lead Channel Setting Pacing Pulse Width: 0.4 ms
Lead Channel Setting Sensing Sensitivity: 2.8 mV
MDC IDC MSMT LEADCHNL RA SENSING INTR AMPL: 1.625 mV
MDC IDC STAT BRADY AP VS PERCENT: 0 %

## 2019-01-18 NOTE — Telephone Encounter (Signed)
New Message   Pts daughter is calling because the Pt has a procedure today and was advised to not eat anything but she did eat  Please call back

## 2019-01-18 NOTE — Telephone Encounter (Signed)
Pt's daughter called today to let us know her mother ate cereal and a grilled cheese today. I called endoscopy and they confirmed she would need to reschedule. They were able to schedule her for Thur March 12 @ 12:00pm. Pt's daughter understands she is to report to patient registration at 11am that morning. She is not to have anything to eat or drink after midnight on Wed March 11. She is also to hold her lasix that morning.  Pt's daughter has verbalized understanding and has no additional questions.

## 2019-01-20 ENCOUNTER — Observation Stay (HOSPITAL_COMMUNITY): Payer: Medicare Other

## 2019-01-20 ENCOUNTER — Ambulatory Visit (HOSPITAL_COMMUNITY): Payer: Medicare Other | Admitting: Certified Registered Nurse Anesthetist

## 2019-01-20 ENCOUNTER — Ambulatory Visit (HOSPITAL_COMMUNITY): Payer: Medicare Other

## 2019-01-20 ENCOUNTER — Encounter (HOSPITAL_COMMUNITY): Payer: Self-pay

## 2019-01-20 ENCOUNTER — Encounter (HOSPITAL_COMMUNITY)
Admission: AD | Disposition: A | Discharge status: Home / Self Care | Payer: Self-pay | Source: Home / Self Care | Attending: Internal Medicine

## 2019-01-20 ENCOUNTER — Inpatient Hospital Stay (HOSPITAL_COMMUNITY)
Admission: AD | Admit: 2019-01-20 | Discharge: 2019-01-23 | DRG: 308 | Disposition: A | Discharge status: Home / Self Care | Payer: Medicare Other | Attending: Family Medicine | Admitting: Family Medicine

## 2019-01-20 ENCOUNTER — Other Ambulatory Visit: Payer: Self-pay

## 2019-01-20 DIAGNOSIS — F419 Anxiety disorder, unspecified: Secondary | ICD-10-CM | POA: Diagnosis present

## 2019-01-20 DIAGNOSIS — E785 Hyperlipidemia, unspecified: Secondary | ICD-10-CM | POA: Diagnosis present

## 2019-01-20 DIAGNOSIS — N183 Chronic kidney disease, stage 3 (moderate): Secondary | ICD-10-CM | POA: Diagnosis not present

## 2019-01-20 DIAGNOSIS — I10 Essential (primary) hypertension: Secondary | ICD-10-CM | POA: Diagnosis not present

## 2019-01-20 DIAGNOSIS — Z8249 Family history of ischemic heart disease and other diseases of the circulatory system: Secondary | ICD-10-CM

## 2019-01-20 DIAGNOSIS — J9601 Acute respiratory failure with hypoxia: Secondary | ICD-10-CM | POA: Diagnosis present

## 2019-01-20 DIAGNOSIS — K449 Diaphragmatic hernia without obstruction or gangrene: Secondary | ICD-10-CM | POA: Diagnosis present

## 2019-01-20 DIAGNOSIS — H9190 Unspecified hearing loss, unspecified ear: Secondary | ICD-10-CM | POA: Diagnosis not present

## 2019-01-20 DIAGNOSIS — Z886 Allergy status to analgesic agent status: Secondary | ICD-10-CM | POA: Diagnosis not present

## 2019-01-20 DIAGNOSIS — Z950 Presence of cardiac pacemaker: Secondary | ICD-10-CM

## 2019-01-20 DIAGNOSIS — I495 Sick sinus syndrome: Secondary | ICD-10-CM | POA: Diagnosis present

## 2019-01-20 DIAGNOSIS — M1990 Unspecified osteoarthritis, unspecified site: Secondary | ICD-10-CM | POA: Diagnosis not present

## 2019-01-20 DIAGNOSIS — Z85828 Personal history of other malignant neoplasm of skin: Secondary | ICD-10-CM

## 2019-01-20 DIAGNOSIS — I4891 Unspecified atrial fibrillation: Secondary | ICD-10-CM | POA: Diagnosis not present

## 2019-01-20 DIAGNOSIS — I361 Nonrheumatic tricuspid (valve) insufficiency: Secondary | ICD-10-CM

## 2019-01-20 DIAGNOSIS — E876 Hypokalemia: Secondary | ICD-10-CM | POA: Diagnosis not present

## 2019-01-20 DIAGNOSIS — Z885 Allergy status to narcotic agent status: Secondary | ICD-10-CM

## 2019-01-20 DIAGNOSIS — Z79899 Other long term (current) drug therapy: Secondary | ICD-10-CM

## 2019-01-20 DIAGNOSIS — I480 Paroxysmal atrial fibrillation: Secondary | ICD-10-CM | POA: Diagnosis not present

## 2019-01-20 DIAGNOSIS — I5031 Acute diastolic (congestive) heart failure: Secondary | ICD-10-CM | POA: Diagnosis not present

## 2019-01-20 DIAGNOSIS — Z833 Family history of diabetes mellitus: Secondary | ICD-10-CM

## 2019-01-20 DIAGNOSIS — Z888 Allergy status to other drugs, medicaments and biological substances status: Secondary | ICD-10-CM | POA: Diagnosis not present

## 2019-01-20 DIAGNOSIS — Z7901 Long term (current) use of anticoagulants: Secondary | ICD-10-CM

## 2019-01-20 DIAGNOSIS — J9690 Respiratory failure, unspecified, unspecified whether with hypoxia or hypercapnia: Secondary | ICD-10-CM | POA: Diagnosis not present

## 2019-01-20 DIAGNOSIS — I340 Nonrheumatic mitral (valve) insufficiency: Secondary | ICD-10-CM | POA: Diagnosis not present

## 2019-01-20 DIAGNOSIS — I129 Hypertensive chronic kidney disease with stage 1 through stage 4 chronic kidney disease, or unspecified chronic kidney disease: Secondary | ICD-10-CM | POA: Diagnosis not present

## 2019-01-20 DIAGNOSIS — I358 Other nonrheumatic aortic valve disorders: Secondary | ICD-10-CM | POA: Diagnosis present

## 2019-01-20 DIAGNOSIS — I130 Hypertensive heart and chronic kidney disease with heart failure and stage 1 through stage 4 chronic kidney disease, or unspecified chronic kidney disease: Secondary | ICD-10-CM | POA: Diagnosis present

## 2019-01-20 DIAGNOSIS — Z96642 Presence of left artificial hip joint: Secondary | ICD-10-CM | POA: Diagnosis present

## 2019-01-20 DIAGNOSIS — N189 Chronic kidney disease, unspecified: Secondary | ICD-10-CM | POA: Diagnosis not present

## 2019-01-20 DIAGNOSIS — K219 Gastro-esophageal reflux disease without esophagitis: Secondary | ICD-10-CM | POA: Diagnosis not present

## 2019-01-20 HISTORY — PX: CARDIOVERSION: SHX1299

## 2019-01-20 HISTORY — PX: TEE WITHOUT CARDIOVERSION: SHX5443

## 2019-01-20 LAB — COMPREHENSIVE METABOLIC PANEL
ALBUMIN: 3.5 g/dL (ref 3.5–5.0)
ALT: 25 U/L (ref 0–44)
AST: 27 U/L (ref 15–41)
Alkaline Phosphatase: 71 U/L (ref 38–126)
Anion gap: 6 (ref 5–15)
BUN: 26 mg/dL — AB (ref 8–23)
CO2: 27 mmol/L (ref 22–32)
Calcium: 9.4 mg/dL (ref 8.9–10.3)
Chloride: 107 mmol/L (ref 98–111)
Creatinine, Ser: 1.39 mg/dL — ABNORMAL HIGH (ref 0.44–1.00)
GFR calc Af Amer: 40 mL/min — ABNORMAL LOW (ref >60)
GFR calc non Af Amer: 34 mL/min — ABNORMAL LOW (ref >60)
Glucose, Bld: 105 mg/dL — ABNORMAL HIGH (ref 70–99)
Potassium: 3.8 mmol/L (ref 3.5–5.1)
SODIUM: 140 mmol/L (ref 135–145)
Total Bilirubin: 1.4 mg/dL — ABNORMAL HIGH (ref 0.3–1.2)
Total Protein: 5.8 g/dL — ABNORMAL LOW (ref 6.5–8.1)

## 2019-01-20 LAB — CBC WITH DIFFERENTIAL/PLATELET
Abs Immature Granulocytes: 0.02 10*3/uL (ref 0.00–0.07)
Basophils Absolute: 0.1 10*3/uL (ref 0.0–0.1)
Basophils Relative: 1 %
EOS ABS: 0.2 10*3/uL (ref 0.0–0.5)
Eosinophils Relative: 3 %
HCT: 36 % (ref 36.0–46.0)
Hemoglobin: 11.3 g/dL — ABNORMAL LOW (ref 12.0–15.0)
Immature Granulocytes: 0 %
Lymphocytes Relative: 18 %
Lymphs Abs: 0.9 10*3/uL (ref 0.7–4.0)
MCH: 30.2 pg (ref 26.0–34.0)
MCHC: 31.4 g/dL (ref 30.0–36.0)
MCV: 96.3 fL (ref 80.0–100.0)
Monocytes Absolute: 0.5 10*3/uL (ref 0.1–1.0)
Monocytes Relative: 9 %
Neutro Abs: 3.7 10*3/uL (ref 1.7–7.7)
Neutrophils Relative %: 69 %
Platelets: 218 10*3/uL (ref 150–400)
RBC: 3.74 MIL/uL — AB (ref 3.87–5.11)
RDW: 12.8 % (ref 11.5–15.5)
WBC: 5.3 10*3/uL (ref 4.0–10.5)
nRBC: 0 % (ref 0.0–0.2)

## 2019-01-20 LAB — TSH: TSH: 0.952 u[IU]/mL (ref 0.350–4.500)

## 2019-01-20 SURGERY — CARDIOVERSION
Anesthesia: General

## 2019-01-20 MED ORDER — PROPOFOL 500 MG/50ML IV EMUL
INTRAVENOUS | Status: DC | PRN
  Administered 2019-01-20: 75 ug/kg/min via INTRAVENOUS

## 2019-01-20 MED ORDER — FUROSEMIDE 20 MG PO TABS: 20.0000 mg | ORAL_TABLET | Freq: Every day | ORAL | Status: DC

## 2019-01-20 MED ORDER — ONDANSETRON HCL 4 MG PO TABS: 4.0000 mg | ORAL_TABLET | Freq: Four times a day (QID) | ORAL | Status: DC | PRN

## 2019-01-20 MED ORDER — ACETAMINOPHEN 325 MG PO TABS: 650.0000 mg | ORAL_TABLET | Freq: Four times a day (QID) | ORAL | Status: DC | PRN

## 2019-01-20 MED ORDER — ALPRAZOLAM 0.25 MG PO TABS
0.2500 mg | ORAL_TABLET | Freq: Every day | ORAL | Status: DC
  Administered 2019-01-20 – 2019-01-22 (×3): 0.25 mg via ORAL
  Filled 2019-01-20 (×3): qty 1

## 2019-01-20 MED ORDER — ONDANSETRON HCL 4 MG/2ML IJ SOLN: 4.0000 mg | Freq: Four times a day (QID) | INTRAMUSCULAR | Status: DC | PRN

## 2019-01-20 MED ORDER — DOCUSATE SODIUM 100 MG PO CAPS
100.0000 mg | ORAL_CAPSULE | Freq: Two times a day (BID) | ORAL | Status: DC
  Administered 2019-01-20 – 2019-01-23 (×6): 100 mg via ORAL
  Filled 2019-01-20 (×6): qty 1

## 2019-01-20 MED ORDER — SODIUM CHLORIDE 0.9 % IV SOLN
INTRAVENOUS | Status: DC
  Administered 2019-01-20: 15:00:00 via INTRAVENOUS

## 2019-01-20 MED ORDER — IRBESARTAN 150 MG PO TABS
150.0000 mg | ORAL_TABLET | Freq: Every day | ORAL | Status: DC
  Administered 2019-01-20 – 2019-01-22 (×3): 150 mg via ORAL
  Filled 2019-01-20 (×3): qty 1

## 2019-01-20 MED ORDER — PROPOFOL 10 MG/ML IV BOLUS
INTRAVENOUS | Status: DC | PRN
  Administered 2019-01-20 (×2): 25 mg via INTRAVENOUS

## 2019-01-20 MED ORDER — AMIODARONE HCL 200 MG PO TABS
100.0000 mg | ORAL_TABLET | Freq: Every day | ORAL | Status: DC
  Administered 2019-01-21 – 2019-01-23 (×3): 100 mg via ORAL
  Filled 2019-01-20 (×3): qty 1

## 2019-01-20 MED ORDER — ACETAMINOPHEN 650 MG RE SUPP: 650.0000 mg | Freq: Four times a day (QID) | RECTAL | Status: DC | PRN

## 2019-01-20 MED ORDER — BUSPIRONE HCL 5 MG PO TABS
5.0000 mg | ORAL_TABLET | Freq: Two times a day (BID) | ORAL | Status: DC | PRN
  Filled 2019-01-20: qty 1

## 2019-01-20 MED ORDER — ORAL CARE MOUTH RINSE
15.0000 mL | Freq: Two times a day (BID) | OROMUCOSAL | Status: DC
  Administered 2019-01-21 – 2019-01-23 (×3): 15 mL via OROMUCOSAL

## 2019-01-20 MED ORDER — PANTOPRAZOLE SODIUM 40 MG PO TBEC
40.0000 mg | DELAYED_RELEASE_TABLET | Freq: Every day | ORAL | Status: DC
  Administered 2019-01-21 – 2019-01-23 (×3): 40 mg via ORAL
  Filled 2019-01-20 (×3): qty 1

## 2019-01-20 MED ORDER — RIVAROXABAN 15 MG PO TABS
15.0000 mg | ORAL_TABLET | Freq: Every day | ORAL | Status: DC
  Administered 2019-01-20 – 2019-01-22 (×3): 15 mg via ORAL
  Filled 2019-01-20 (×3): qty 1

## 2019-01-20 MED ORDER — CARVEDILOL 25 MG PO TABS
25.0000 mg | ORAL_TABLET | Freq: Two times a day (BID) | ORAL | Status: DC
  Administered 2019-01-20 – 2019-01-23 (×6): 25 mg via ORAL
  Filled 2019-01-20 (×6): qty 1

## 2019-01-20 MED ORDER — SODIUM CHLORIDE 0.9% FLUSH
3.0000 mL | Freq: Two times a day (BID) | INTRAVENOUS | Status: DC
  Administered 2019-01-20 – 2019-01-23 (×6): 3 mL via INTRAVENOUS

## 2019-01-20 NOTE — Anesthesia Procedure Notes (Signed)
Procedure Name: General with mask airway Date/Time: 01/20/2019 3:03 PM Performed by: Imagene Riches, CRNA Pre-anesthesia Checklist: Patient being monitored, Suction available, Emergency Drugs available and Patient identified Patient Re-evaluated:Patient Re-evaluated prior to induction

## 2019-01-20 NOTE — Progress Notes (Signed)
Patient admitted to room 5c09 s/p hypoxia. Patient alert oriented, oxygen 2L via [place]. Patient denies shortness of breath at this time. Patient/family oriented to room.

## 2019-01-20 NOTE — CV Procedure (Signed)
   TRANSESOPHAGEAL ECHOCARDIOGRAM GUIDED DIRECT CURRENT CARDIOVERSION  NAME:  [name]   MRN: [id number] DOB:  March 03, 1930   ADMIT DATE: 01/20/2019  INDICATIONS: Atrial fibrillation, was not sure if she had missed any doses of anticoagulation.  PROCEDURE:   Informed consent was obtained prior to the procedure. The risks, benefits and alternatives for the procedure were discussed and the patient comprehended these risks.  Risks include, but are not limited to, cough, sore throat, vomiting, nausea, somnolence, esophageal and stomach trauma or perforation, bleeding, low blood pressure, aspiration, pneumonia, infection, trauma to the teeth and death.    After a procedural time-out, the oropharynx was anesthetized and the patient was sedated by the anesthesia service. The transesophageal probe was inserted in the esophagus and stomach without difficulty and multiple views were obtained.   FINDINGS:  LEFT VENTRICLE: EF = 50-55%. No regional wall motion abnormalities.  RIGHT VENTRICLE: Normal size and function.   LEFT ATRIUM: No thrombus/mass.  LEFT ATRIAL APPENDAGE: No thrombus/mass.   RIGHT ATRIUM: No thrombus/mass.  AORTIC VALVE:  Trileaflet. Mildly thickened/mildly calcified. No regurgitation. No vegetation.  MITRAL VALVE:    Mild anterior leaflet prolapse with mild regurgitation. No vegetation.  TRICUSPID VALVE: Normal structure. Mild-moderate regurgitation. No vegetation.  PULMONIC VALVE: Grossly normal structure. Trivial regurgitation. No apparent vegetation.  INTERATRIAL SEPTUM: No PFO or ASD seen by color Doppler.  PERICARDIUM: Trivial effusion noted.  DESCENDING AORTA: Moderate diffuse plaque seen   CARDIOVERSION:     Indications:  Symptomatic Atrial Fibrillation  Procedure Details:  Once the TEE was complete, the patient had the defibrillator pads placed in the anterior and posterior position. Once an appropriate level of sedation was confirmed, the patient was  cardioverted x 1 with 120J of biphasic synchronized energy.  The patient converted to NSR.  This was verified by interrogation of her Medtronic pacemaker by the device representative. There were no apparent complications.  The patient had normal neuro status and respiratory status post procedure with vitals stable as recorded elsewhere.  Adequate airway was maintained throughout and vital signs monitored per protocol.  Of note, on initial presentation her O2 sats were in the low 90s. Prior to procedure/sedation, she briefly had readings of 84% on room air. She was not symptomatic. She was increased to [place] with improvement in her O2 levels. She will be monitored post procedure to make sure that her O2 levels return to acceptable levels, otherwise she may need to be admitted for workup.  Buford Dresser, MD, PhD [facility]  [address] [place], [place] [address] [phone]   3:14 PM

## 2019-01-20 NOTE — H&P (Addendum)
History and Physical    [name] MHD:[id number] DOB: 11/01/1930 DOA: 01/20/2019  PCP: Chesley Noon, MD Consultants:  [facility]aryl Comes - cardiology; [facility] - GI Patient coming from:  Home - lives with husband; NOK: Daughter, Butch Penny, [phone]  Chief Complaint:  Here for DCCV  HPI: [name] is a 85 y.o. female with medical history significant of sick sinus s/p pacemaker placement; HTN; HLD; and afib on Amiodarone and Xarelto.  They said my oxygen's low.  She has been SOB for at least 2 weeks, attributing it to her afib.  She has really noticed fatigue in the last 2 weeks.  SOB is with exertion.  Recently, she gets worn out just walking across the house.  She thought some of it was anxiety last night, but she had to sit straight up last night to get enough air.  She hasn't been able to lie flat for a while.  She has reflux and coughs sometimes.  Cough is occasionally productive of clear sputum, a little thicker than saliva.  She has had possibly mild change in the last 2 weeks but not much.  She did feel like she was getting a respiratory infection about 3 weeks ago.  She was never a smoker.  No wheezing.  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  Her husband has COPD and was a smoker and so she was exposed to heavy smoke.  No LE edema.  Weight has not changed.      Procedural Course:   Patient had TEE cardioversion today; SOB may have been from another problem.  Resting O2 in low 90s, but dropped into upper 80s.  Improved with [place].  Suggest overnight observation with CXR.  Does not appear grossly volume overloaded.  No longer in afib.  She had TEE despite Xarelto.  Review of Systems: As per HPI; otherwise review of systems reviewed and negative.   Ambulatory Status:  Ambulates without assistance  Past Medical History:  Diagnosis Date  . Anxiety   . Aortic valve sclerosis   . Atrial fibrillation ([place])   . Basal cell carcinoma    hx; near eyes  . GERD (gastroesophageal reflux  disease)   . H/O one miscarriage   . Hard of hearing    has hearing aides but does not wear   . History of hiatal hernia   . History of kidney stones   . Hyperlipidemia   . Hypertension   . OA (osteoarthritis)    "right pointer; right hip" (01/05/2017)  . Obesity   . Renal insufficiency   . SSS (sick sinus syndrome) ([place]) 01/05/2017   a. s/p MDT dual chamber PPM   . Tinnitus   . Varicose veins   . Wears glasses     Past Surgical History:  Procedure Laterality Date  . BREAST CYST EXCISION Right 1977   "benign"  . CARDIOVERSION N/A 07/25/2016   Procedure: CARDIOVERSION;  Surgeon: Lelon Perla, MD;  [facility] ENDOSCOPY;  Service: Cardiovascular;  Laterality: N/A;  . CATARACT EXTRACTION W/ INTRAOCULAR LENS  IMPLANT, BILATERAL Bilateral   . DILATION AND CURETTAGE OF UTERUS    . NASAL SEPTUM SURGERY    . OVARIAN CYST REMOVAL  2002  . PACEMAKER IMPLANT N/A 01/05/2017   Procedure: Pacemaker Implant;  Surgeon: Will Meredith Leeds, MD;  Location: [place] CV LAB;  Service: Cardiovascular;  Laterality: N/A;  . TEE WITHOUT CARDIOVERSION N/A 12/16/2016   Procedure: TRANSESOPHAGEAL ECHOCARDIOGRAM (TEE);  Surgeon: Larey Dresser, MD;  Location: [facility] ENDOSCOPY;  Service: Cardiovascular;  Laterality: N/A;  . TONSILLECTOMY    . TOTAL HIP ARTHROPLASTY Left 12/18/2015   Procedure: LEFT TOTAL HIP ARTHROPLASTY ANTERIOR APPROACH;  Surgeon: Paralee Cancel, MD;  Location: [facility] ORS;  Service: Orthopedics;  Laterality: Left;    Social History   Socioeconomic History  . Marital status: Married    Spouse name: Not on file  . Number of children: Not on file  . Years of education: Not on file  . Highest education level: Not on file  Occupational History  . Not on file  Social Needs  . Financial resource strain: Not on file  . Food insecurity:    Worry: Not on file    Inability: Not on file  . Transportation needs:    Medical: Not on file    Non-medical: Not on file  Tobacco Use  . Smoking  status: Never Smoker  . Smokeless tobacco: Never Used  Substance and Sexual Activity  . Alcohol use: No  . Drug use: No  . Sexual activity: Not Currently  Lifestyle  . Physical activity:    Days per week: Not on file    Minutes per session: Not on file  . Stress: Not on file  Relationships  . Social connections:    Talks on phone: Not on file    Gets together: Not on file    Attends religious service: Not on file    Active member of club or organization: Not on file    Attends meetings of clubs or organizations: Not on file    Relationship status: Not on file  . Intimate partner violence:    Fear of current or ex partner: Not on file    Emotionally abused: Not on file    Physically abused: Not on file    Forced sexual activity: Not on file  Other Topics Concern  . Not on file  Social History Narrative  . Not on file    Allergies  Allergen Reactions  . Celebrex [Celecoxib] Other (See Comments)    Reflux  . Codeine Nausea Only  . Ace Inhibitors Cough  . Norvasc [Amlodipine Besylate] Cough    Higher doses = VERY BAD COUGHING   . Vioxx [Rofecoxib] Other (See Comments)    Pt unsure of reaction    Family History  Problem Relation Age of Onset  . Diabetes Mother   . Heart disease Father   . Hypertension Sister   . Hypertension Brother     Prior to Admission medications   Medication Sig Start Date End Date Taking? Authorizing Provider  acetaminophen (TYLENOL) 500 MG tablet Take 500-1,000 mg by mouth every 6 (six) hours as needed for headache (pain).   Yes [provider]  ALPRAZolam Duanne Moron) 0.5 MG tablet Take 0.25 mg by mouth at bedtime.    Yes [provider]  amiodarone (PACERONE) 100 MG tablet TAKE 1 TABLET(100 MG) BY MOUTH DAILY Patient taking differently: Take 100 mg by mouth daily with breakfast.  11/01/18  Yes Deboraha Sprang, MD  antiseptic oral rinse (BIOTENE) LIQD 15 mLs by Mouth Rinse route as needed for dry mouth.   Yes [provider]  Biotin 1000 MCG tablet Take 1,000 mcg by mouth every other day. At noon   Yes [provider]  busPIRone (BUSPAR) 5 MG tablet Take 5 mg by mouth 2 (two) times daily as needed (for stress/anxiety).    Yes [provider]  carvedilol (COREG) 25 MG tablet  Take 1 tablet (25 mg total) by mouth 2 (two) times daily with a meal. Please keep upcoming appt in December for future refills. Thank you 09/28/18  Yes Deboraha Sprang, MD  furosemide (LASIX) 20 MG tablet TAKE 1 TABLET(20 MG) BY MOUTH DAILY Patient taking differently: Take 20 mg by mouth daily.  01/13/18  Yes Deboraha Sprang, MD  irbesartan (AVAPRO) 300 MG tablet Take 0.5 tablets (150 mg total) by mouth at bedtime. 10/12/18  Yes Deboraha Sprang, MD  Magnesium 500 MG CAPS Take 500 mg by mouth every other day. Before supper   Yes [provider]  omeprazole (PRILOSEC) 40 MG capsule Take 40 mg by mouth daily.   Yes [provider]  Polyethyl Glycol-Propyl Glycol (SYSTANE OP) Place 1 drop into both eyes at bedtime as needed (dry eyes).   Yes [provider]  pyridOXINE (VITAMIN B-6) 100 MG tablet Take 100 mg by mouth every other day. In the morning   Yes [provider]  XARELTO 15 MG TABS tablet TAKE 1 TABLET(15 MG) BY MOUTH DAILY WITH SUPPER Patient taking differently: Take 15 mg by mouth daily with supper.  11/01/18  Yes Deboraha Sprang, MD    Physical Exam: Vitals:   01/20/19 1603 01/20/19 1610 01/20/19 1620 01/20/19 1630  BP:  (!) 174/68 (!) 170/55 (!) 171/103  Pulse: (!) 58 (!) 59 (!) 59 (!) 59  Resp: 19 (!) 22 20 (!) 21  Temp:      TempSrc:      SpO2: 91% 95% 95% 96%  Weight:      Height:         . General:  Appears calm and comfortable and is NAD . Eyes:   EOMI, normal lids, iris . ENT:  grossly normal hearing, lips & tongue, mmm . Neck:  no LAD, masses or thyromegaly; no carotid bruits . Cardiovascular:  RRR, no m/r/g. No LE edema.  Marland Kitchen Respiratory:   CTA  bilaterally with no wheezes/rales/rhonchi.  Normal respiratory effort. . Abdomen:  soft, NT, ND, NABS . Back:   normal alignment, no CVAT . Skin:  no rash or induration seen on limited exam . Musculoskeletal:  grossly normal tone BUE/BLE, good ROM, no bony abnormality . Psychiatric:  grossly normal mood and affect, speech fluent and appropriate, AOx3 . Neurologic:  CN 2-12 grossly intact, moves all extremities in coordinated fashion, sensation intact    Radiological Exams on Admission: No results found.   Labs on Admission: I have personally reviewed the available labs and imaging studies at the time of the admission.  Pertinent labs:   CBC, CMP, TSH pending   Assessment/Plan Principal Problem:   Acute respiratory failure with hypoxia (HCC) Active Problems:   Hypertension   PAF (paroxysmal atrial fibrillation) (HCC)   Acute respiratory failure with hypoxia -Patient with several weeks of worsening respiratory compromise, particularly DOE -No wheezing -Mild cough, occasionally productive, not significantly changed from baseline -Low suspicion for PNA or respiratory infection but will order PA/lateral CXR -Low suspicion for PE given patient's ongoing use of Xarelto; could consider CTA if she has ongoing hypoxia -COPD is a consideration given her husband's long-term smoking and her former occupational exposure -So far, this appears most likely related to afib, which is currently resolved -Will observe overnight on telemetry -Will do pulse ox with ambulation -Check CBC, CMP, TSH  Afib s/p DCCV -Back in NSR at this time -Continue Amiodarone, Coreg -Continue Xarelto  HTN -Continue Coreg, Avapro  DVT prophylaxis: Xarelto Code Status:  Full - confirmed with patient/family Family Communication: Daughter present throughout evaluation  Disposition Plan:  Home once clinically improved Consults called: Cardiology  Admission status: It is my clinical opinion that referral  for OBSERVATION is reasonable and necessary in this patient based on the above information provided. The aforementioned taken together are felt to place the patient at high risk for further clinical deterioration. However it is anticipated that the patient may be medically stable for discharge from the hospital within 24 to 48 hours.    Karmen Bongo MD [facility]   How to contact the [facility] Attending or Consulting provider [place] or covering provider during after hours [place], for this patient?  1. Check the care team in [facility] and look for a) attending/consulting TRH provider listed and b) the [facility] team listed 2. Log into [email or web address] and use [place]'s universal password to access. If you do not have the password, please contact the hospital operator. 3. Locate the [facility] provider you are looking for under [facility] and page to a number that you can be directly reached. 4. If you still have difficulty reaching the provider, please page the [facility] (Director on Call) for the Hospitalists listed on amion for assistance.   01/20/2019, 5:25 PM

## 2019-01-20 NOTE — Progress Notes (Signed)
  Echocardiogram Echocardiogram Transesophageal has been performed.  [name] 01/20/2019, 3:28 PM

## 2019-01-20 NOTE — Progress Notes (Signed)
Pt. O2 at 88-91 % on Room air, pt does not use O2 at home.  Pt placed on 2L [place] then taken down to 1L [place] and again to room air. Pt at 91% RA. Harrell Gave MD made aware and coming to speak with patient. NAD.

## 2019-01-20 NOTE — Interval H&P Note (Signed)
History and Physical Interval Note:  01/20/2019 1:50 PM  [name]  has presented today for surgery, with the diagnosis of ATRIAL FIBRILLATION.  The various methods of treatment have been discussed with the patient and family. After consideration of risks, benefits and other options for treatment, the patient has consented to  Procedure(s): CARDIOVERSION (N/A) as a surgical intervention.  The patient's history has been reviewed, patient examined, no change in status, stable for surgery.  I have reviewed the patient's chart and labs.  Questions were answered to the patient's satisfaction.    On interview, while she does not recall missing any doses of her blood thinner, she is not 100% sure of it. She feels concerned about her memory as she recently ate (before her prior scheduled cardioversion) even though she was supposed to be NPO. Discussed options at length with patient and her daughter. She would like a TEE prior to cardioversion to exclude thrombus. No issues with swallowing, no prior radiation, tolerated EGDs in the past without issue. We will add her on for TEE prior to cardioversion.  Arlester Keehan Harrell Gave

## 2019-01-20 NOTE — Transfer of Care (Signed)
Immediate Anesthesia Transfer of Care Note  Patient: [name]  Procedure(s) Performed: CARDIOVERSION (N/A ) TRANSESOPHAGEAL ECHOCARDIOGRAM (TEE) (N/A )  Patient Location: Endoscopy Unit  Anesthesia Type:General  Level of Consciousness: drowsy  Airway & Oxygen Therapy: Patient Spontanous Breathing and Patient connected to nasal cannula oxygen  Post-op Assessment: Report given to RN and Post -op Vital signs reviewed and stable  Post vital signs: Reviewed and stable  Last Vitals:  Vitals Value Taken Time  BP    Temp    Pulse    Resp    SpO2      Last Pain:  Vitals:   01/20/19 1208  TempSrc: Oral  PainSc: 0-No pain         Complications: No apparent anesthesia complications

## 2019-01-20 NOTE — Anesthesia Preprocedure Evaluation (Signed)
Anesthesia Evaluation  Patient identified by MRN, date of birth, ID band Patient awake    Reviewed: Allergy & Precautions, H&P , NPO status , Patient's Chart, lab work & pertinent test results  Airway Mallampati: II   Neck ROM: full    Dental   Pulmonary neg pulmonary ROS,    breath sounds clear to auscultation       Cardiovascular hypertension, + dysrhythmias Atrial Fibrillation  Rhythm:irregular Rate:Normal     Neuro/Psych PSYCHIATRIC DISORDERS Anxiety    GI/Hepatic hiatal hernia, GERD  ,  Endo/Other    Renal/GU Renal InsufficiencyRenal disease     Musculoskeletal  (+) Arthritis ,   Abdominal   Peds  Hematology   Anesthesia Other Findings   Reproductive/Obstetrics                             Anesthesia Physical Anesthesia Plan  ASA: III  Anesthesia Plan: General   Post-op Pain Management:    Induction: Intravenous  PONV Risk Score and Plan: 3 and Propofol infusion  Airway Management Planned: Mask  Additional Equipment:   Intra-op Plan:   Post-operative Plan:   Informed Consent: I have reviewed the patients History and Physical, chart, labs and discussed the procedure including the risks, benefits and alternatives for the proposed anesthesia with the patient or authorized representative who has indicated his/her understanding and acceptance.       Plan Discussed with: CRNA and Anesthesiologist  Anesthesia Plan Comments:         Anesthesia Quick Evaluation

## 2019-01-21 DIAGNOSIS — I10 Essential (primary) hypertension: Secondary | ICD-10-CM | POA: Diagnosis not present

## 2019-01-21 DIAGNOSIS — J9601 Acute respiratory failure with hypoxia: Secondary | ICD-10-CM | POA: Diagnosis not present

## 2019-01-21 DIAGNOSIS — I480 Paroxysmal atrial fibrillation: Principal | ICD-10-CM

## 2019-01-21 LAB — CBC
HCT: 33.6 % — ABNORMAL LOW (ref 36.0–46.0)
Hemoglobin: 10.7 g/dL — ABNORMAL LOW (ref 12.0–15.0)
MCH: 30.3 pg (ref 26.0–34.0)
MCHC: 31.8 g/dL (ref 30.0–36.0)
MCV: 95.2 fL (ref 80.0–100.0)
PLATELETS: 201 10*3/uL (ref 150–400)
RBC: 3.53 MIL/uL — ABNORMAL LOW (ref 3.87–5.11)
RDW: 12.8 % (ref 11.5–15.5)
WBC: 5.3 10*3/uL (ref 4.0–10.5)
nRBC: 0 % (ref 0.0–0.2)

## 2019-01-21 LAB — BASIC METABOLIC PANEL
Anion gap: 8 (ref 5–15)
BUN: 26 mg/dL — AB (ref 8–23)
CO2: 25 mmol/L (ref 22–32)
CREATININE: 1.38 mg/dL — AB (ref 0.44–1.00)
Calcium: 9.4 mg/dL (ref 8.9–10.3)
Chloride: 107 mmol/L (ref 98–111)
GFR calc Af Amer: 40 mL/min — ABNORMAL LOW (ref >60)
GFR calc non Af Amer: 35 mL/min — ABNORMAL LOW (ref >60)
Glucose, Bld: 98 mg/dL (ref 70–99)
Potassium: 3.9 mmol/L (ref 3.5–5.1)
Sodium: 140 mmol/L (ref 135–145)

## 2019-01-21 LAB — BRAIN NATRIURETIC PEPTIDE: B Natriuretic Peptide: 280 pg/mL — ABNORMAL HIGH (ref 0.0–100.0)

## 2019-01-21 MED ORDER — FUROSEMIDE 10 MG/ML IJ SOLN
40.0000 mg | Freq: Once | INTRAMUSCULAR | Status: AC
  Administered 2019-01-21: 40 mg via INTRAVENOUS
  Filled 2019-01-21: qty 4

## 2019-01-21 MED ORDER — FUROSEMIDE 10 MG/ML IJ SOLN
40.0000 mg | Freq: Every day | INTRAMUSCULAR | Status: DC
  Administered 2019-01-22: 40 mg via INTRAVENOUS
  Filled 2019-01-21: qty 4

## 2019-01-21 NOTE — Care Management Obs Status (Signed)
[place] NOTIFICATION   Patient Details  Name: [name] MRN: [id number] Date of Birth: 24-May-1930   Medicare Observation Status Notification Given:  Yes    Midge Minium RN, BSN, NCM-BC, ACM-RN [phone] 01/21/2019, 4:29 PM

## 2019-01-21 NOTE — Anesthesia Postprocedure Evaluation (Addendum)
Anesthesia Post Note  Patient: [name]  Procedure(s) Performed: CARDIOVERSION (N/A ) TRANSESOPHAGEAL ECHOCARDIOGRAM (TEE) (N/A )     Patient location during evaluation: Endoscopy Anesthesia Type: General Level of consciousness: awake and alert Pain management: pain level controlled Vital Signs Assessment: post-procedure vital signs reviewed and stable Respiratory status: spontaneous breathing, nonlabored ventilation, respiratory function stable and patient connected to nasal cannula oxygen Cardiovascular status: blood pressure returned to baseline and stable Postop Assessment: no apparent nausea or vomiting Anesthetic complications: no    Last Vitals:  Vitals:   01/20/19 2333 01/21/19 0509  BP: 138/61 (!) 156/69  Pulse: 60 60  Resp: 18 18  Temp: 36.5 C 36.6 C  SpO2: 97% 94%    Last Pain:  Vitals:   01/21/19 0509  TempSrc: Oral  PainSc:                  [name]

## 2019-01-21 NOTE — Progress Notes (Signed)
Progress Note    [name]  QBH:[id number] DOB: July 05, 1930  DOA: 01/20/2019 PCP: Chesley Noon, MD    Brief Narrative:     Medical records reviewed and are as summarized below:  [name] is an 85 y.o. female with medical history significant of sick sinus s/p pacemaker placement; HTN; HLD; and afib on Amiodarone and Xarelto.  They said my oxygen's low.  She has been SOB for at least 2 weeks, attributing it to her afib.   Assessment/Plan:   Principal Problem:   Acute respiratory failure with hypoxia (HCC) Active Problems:   Hypertension   PAF (paroxysmal atrial fibrillation) (HCC)  Acute respiratory failure with hypoxia due to volume overload from a fib -BNP elevated IV Lasix x 1, monitor response -chest x ray: Cardiomegaly with interstitial pulmonary edema; Tiny bilateral pleural effusions. Home O2 study- wean O2 as tolerated  Afib s/p DCCV -Back in NSR at this time -Continue Amiodarone, Coreg -Continue Xarelto  HTN -Continue Coreg, Avapro   Family Communication/Anticipated D/C date and plan/Code Status   DVT prophylaxis: xarelto Code Status: Full Code.  Family Communication:  Disposition Plan: home once diuresed   Medical Consultants:    None.     Subjective:   Still very short of breath with exertion  Objective:    Vitals:   01/20/19 1630 01/20/19 1700 01/20/19 2333 01/21/19 0509  BP: (!) 171/103 (!) 173/93 138/61 (!) 156/69  Pulse: (!) 59 70 60 60  Resp: (!) 21  18 18   Temp:  (!) 97.4 F (36.3 C) 97.7 F (36.5 C) 97.9 F (36.6 C)  TempSrc:  Oral Oral Oral  SpO2: 96% 100% 97% 94%  Weight:      Height:        Intake/Output Summary (Last 24 hours) at 01/21/2019 1135 Last data filed at 01/20/2019 2200 Gross per 24 hour  Intake 203 ml  Output -  Net 203 ml   Filed Weights   01/20/19 1208  Weight: 78.9 kg    Exam: In bed, NAD Crackles at bases, no wheezing rrr No LE Edema   Data Reviewed:   I have  personally reviewed following labs and imaging studies:  Labs: Labs show the following:   Basic Metabolic Panel: Recent Labs  Lab 01/20/19 1758 01/21/19 0547  NA 140 140  K 3.8 3.9  CL 107 107  CO2 27 25  GLUCOSE 105* 98  BUN 26* 26*  CREATININE 1.39* 1.38*  CALCIUM 9.4 9.4   GFR Estimated Creatinine Clearance: 29.8 mL/min (A) (by C-G formula based on SCr of 1.38 mg/dL (H)). Liver Function Tests: Recent Labs  Lab 01/20/19 1758  AST 27  ALT 25  ALKPHOS 71  BILITOT 1.4*  PROT 5.8*  ALBUMIN 3.5   No results for input(s): LIPASE, AMYLASE in the last 168 hours. No results for input(s): AMMONIA in the last 168 hours. Coagulation profile No results for input(s): INR, PROTIME in the last 168 hours.  CBC: Recent Labs  Lab 01/20/19 1758 01/21/19 0547  WBC 5.3 5.3  NEUTROABS 3.7  --   HGB 11.3* 10.7*  HCT 36.0 33.6*  MCV 96.3 95.2  PLT 218 201   Cardiac Enzymes: No results for input(s): CKTOTAL, CKMB, CKMBINDEX, TROPONINI in the last 168 hours. BNP (last 3 results) No results for input(s): PROBNP in the last 8760 hours. CBG: No results for input(s): GLUCAP in the last 168 hours. D-Dimer: No results for input(s): DDIMER in the last 72 hours.  Hgb A1c: No results for input(s): HGBA1C in the last 72 hours. Lipid Profile: No results for input(s): CHOL, HDL, LDLCALC, TRIG, CHOLHDL, LDLDIRECT in the last 72 hours. Thyroid function studies: Recent Labs    01/20/19 1758  TSH 0.952   Anemia work up: No results for input(s): VITAMINB12, FOLATE, FERRITIN, TIBC, IRON, RETICCTPCT in the last 72 hours. Sepsis Labs: Recent Labs  Lab 01/20/19 1758 01/21/19 0547  WBC 5.3 5.3    Microbiology No results found for this or any previous visit (from the past 240 hour(s)).  Procedures and diagnostic studies:  X-ray Chest Pa And Lateral  Result Date: 01/20/2019 CLINICAL DATA:  Respiratory failure EXAM: CHEST - 2 VIEW COMPARISON:  11/10/2017 FINDINGS: The cardio  pericardial silhouette is enlarged. Diffuse interstitial opacity suggests edema. Tiny pleural effusions noted on the lateral projection. Left permanent pacemaker evident. Telemetry leads overlie the chest. IMPRESSION: Cardiomegaly with interstitial pulmonary edema. Tiny bilateral pleural effusions. Electronically Signed   By: Misty Stanley M.D.   On: 01/20/2019 18:50    Medications:   . ALPRAZolam  0.25 mg Oral QHS  . amiodarone  100 mg Oral Q breakfast  . carvedilol  25 mg Oral BID WC  . docusate sodium  100 mg Oral BID  . irbesartan  150 mg Oral QHS  . mouth rinse  15 mL Mouth Rinse BID  . pantoprazole  40 mg Oral Daily  . Rivaroxaban  15 mg Oral Q supper  . sodium chloride flush  3 mL Intravenous Q12H   Continuous Infusions:   LOS: 0 days   Geradine Girt  [facility]   How to contact the [facility] Attending or Consulting provider [place] or covering provider during after hours [place], for this patient?  1. Check the care team in [facility] and look for a) attending/consulting TRH provider listed and b) the [facility] team listed 2. Log into [email or web address] and use [place]'s universal password to access. If you do not have the password, please contact the hospital operator. 3. Locate the [facility] provider you are looking for under [facility] and page to a number that you can be directly reached. 4. If you still have difficulty reaching the provider, please page the [facility] (Director on Call) for the Hospitalists listed on amion for assistance.  01/21/2019, 11:35 AM

## 2019-01-21 NOTE — Progress Notes (Signed)
SATURATION QUALIFICATIONS: (This note is used to comply with regulatory documentation for home oxygen)  Patient Saturations on Room Air at Rest = 94%  Patient Saturations on Room Air while Ambulating = 83%  Patient Saturations on 2 Liters of oxygen while Ambulating= 85%  Patient Saturations on 4 Liters of oxygen while Ambulating = 92%  Please briefly explain why patient needs home oxygen:

## 2019-01-21 NOTE — Progress Notes (Deleted)
SATURATION QUALIFICATIONS: (This note is used to comply with regulatory documentation for home oxygen)  Patient Saturations on Room Air at Rest = 86-87% Patient Saturations on Room Air while Ambulating = 80-83%   Please briefly explain why patient needs home oxygen: Patient was very short of breath while walking, and was unable to walk far.

## 2019-01-21 NOTE — Discharge Instructions (Signed)

## 2019-01-22 ENCOUNTER — Encounter (HOSPITAL_COMMUNITY): Payer: Self-pay | Admitting: Cardiology

## 2019-01-22 DIAGNOSIS — J9601 Acute respiratory failure with hypoxia: Secondary | ICD-10-CM | POA: Diagnosis present

## 2019-01-22 DIAGNOSIS — I358 Other nonrheumatic aortic valve disorders: Secondary | ICD-10-CM | POA: Diagnosis present

## 2019-01-22 DIAGNOSIS — Z7901 Long term (current) use of anticoagulants: Secondary | ICD-10-CM | POA: Diagnosis not present

## 2019-01-22 DIAGNOSIS — Z885 Allergy status to narcotic agent status: Secondary | ICD-10-CM | POA: Diagnosis not present

## 2019-01-22 DIAGNOSIS — M1990 Unspecified osteoarthritis, unspecified site: Secondary | ICD-10-CM | POA: Diagnosis present

## 2019-01-22 DIAGNOSIS — Z833 Family history of diabetes mellitus: Secondary | ICD-10-CM | POA: Diagnosis not present

## 2019-01-22 DIAGNOSIS — I480 Paroxysmal atrial fibrillation: Secondary | ICD-10-CM | POA: Diagnosis present

## 2019-01-22 DIAGNOSIS — E785 Hyperlipidemia, unspecified: Secondary | ICD-10-CM | POA: Diagnosis present

## 2019-01-22 DIAGNOSIS — I495 Sick sinus syndrome: Secondary | ICD-10-CM | POA: Diagnosis present

## 2019-01-22 DIAGNOSIS — Z85828 Personal history of other malignant neoplasm of skin: Secondary | ICD-10-CM | POA: Diagnosis not present

## 2019-01-22 DIAGNOSIS — Z886 Allergy status to analgesic agent status: Secondary | ICD-10-CM | POA: Diagnosis not present

## 2019-01-22 DIAGNOSIS — Z79899 Other long term (current) drug therapy: Secondary | ICD-10-CM | POA: Diagnosis not present

## 2019-01-22 DIAGNOSIS — Z8249 Family history of ischemic heart disease and other diseases of the circulatory system: Secondary | ICD-10-CM | POA: Diagnosis not present

## 2019-01-22 DIAGNOSIS — I10 Essential (primary) hypertension: Secondary | ICD-10-CM | POA: Diagnosis not present

## 2019-01-22 DIAGNOSIS — Z888 Allergy status to other drugs, medicaments and biological substances status: Secondary | ICD-10-CM | POA: Diagnosis not present

## 2019-01-22 DIAGNOSIS — Z96642 Presence of left artificial hip joint: Secondary | ICD-10-CM | POA: Diagnosis present

## 2019-01-22 DIAGNOSIS — H9190 Unspecified hearing loss, unspecified ear: Secondary | ICD-10-CM | POA: Diagnosis present

## 2019-01-22 DIAGNOSIS — I130 Hypertensive heart and chronic kidney disease with heart failure and stage 1 through stage 4 chronic kidney disease, or unspecified chronic kidney disease: Secondary | ICD-10-CM | POA: Diagnosis present

## 2019-01-22 DIAGNOSIS — Z950 Presence of cardiac pacemaker: Secondary | ICD-10-CM | POA: Diagnosis not present

## 2019-01-22 DIAGNOSIS — F419 Anxiety disorder, unspecified: Secondary | ICD-10-CM | POA: Diagnosis present

## 2019-01-22 DIAGNOSIS — K449 Diaphragmatic hernia without obstruction or gangrene: Secondary | ICD-10-CM | POA: Diagnosis present

## 2019-01-22 DIAGNOSIS — N183 Chronic kidney disease, stage 3 (moderate): Secondary | ICD-10-CM | POA: Diagnosis present

## 2019-01-22 DIAGNOSIS — K219 Gastro-esophageal reflux disease without esophagitis: Secondary | ICD-10-CM | POA: Diagnosis present

## 2019-01-22 DIAGNOSIS — I5031 Acute diastolic (congestive) heart failure: Secondary | ICD-10-CM | POA: Diagnosis present

## 2019-01-22 DIAGNOSIS — E876 Hypokalemia: Secondary | ICD-10-CM | POA: Diagnosis not present

## 2019-01-22 LAB — CBC
HCT: 32.6 % — ABNORMAL LOW (ref 36.0–46.0)
HEMOGLOBIN: 10.5 g/dL — AB (ref 12.0–15.0)
MCH: 30.8 pg (ref 26.0–34.0)
MCHC: 32.2 g/dL (ref 30.0–36.0)
MCV: 95.6 fL (ref 80.0–100.0)
Platelets: 197 10*3/uL (ref 150–400)
RBC: 3.41 MIL/uL — AB (ref 3.87–5.11)
RDW: 12.6 % (ref 11.5–15.5)
WBC: 6 10*3/uL (ref 4.0–10.5)
nRBC: 0 % (ref 0.0–0.2)

## 2019-01-22 LAB — BASIC METABOLIC PANEL
Anion gap: 10 (ref 5–15)
BUN: 19 mg/dL (ref 8–23)
CHLORIDE: 103 mmol/L (ref 98–111)
CO2: 26 mmol/L (ref 22–32)
Calcium: 8.9 mg/dL (ref 8.9–10.3)
Creatinine, Ser: 1.3 mg/dL — ABNORMAL HIGH (ref 0.44–1.00)
GFR calc Af Amer: 43 mL/min — ABNORMAL LOW (ref >60)
GFR calc non Af Amer: 37 mL/min — ABNORMAL LOW (ref >60)
Glucose, Bld: 96 mg/dL (ref 70–99)
Potassium: 3.3 mmol/L — ABNORMAL LOW (ref 3.5–5.1)
Sodium: 139 mmol/L (ref 135–145)

## 2019-01-22 MED ORDER — FUROSEMIDE 10 MG/ML IJ SOLN
40.0000 mg | Freq: Two times a day (BID) | INTRAMUSCULAR | Status: DC
  Administered 2019-01-22 – 2019-01-23 (×2): 40 mg via INTRAVENOUS
  Filled 2019-01-22 (×2): qty 4

## 2019-01-22 MED ORDER — POTASSIUM CHLORIDE CRYS ER 20 MEQ PO TBCR
40.0000 meq | EXTENDED_RELEASE_TABLET | Freq: Once | ORAL | Status: AC
  Administered 2019-01-22: 40 meq via ORAL
  Filled 2019-01-22: qty 2

## 2019-01-22 NOTE — Progress Notes (Addendum)
Progress Note    [name]  IWO:[id number] DOB: [birth year]  DOA: 01/20/2019 PCP: Chesley Noon, MD    Brief Narrative:     Medical records reviewed and are as summarized below:  [name] is an 85 y.o. female with medical history significant of sick sinus s/p pacemaker placement; HTN; HLD; and afib on Amiodarone and Xarelto.  They said my oxygen's low.  She has been SOB for at least 2 weeks, attributing it to her afib.   Assessment/Plan:   Principal Problem:   Acute respiratory failure with hypoxia (HCC) Active Problems:   Hypertension   PAF (paroxysmal atrial fibrillation) (HCC)  Acute respiratory failure with hypoxia due to volume overload from a fib -BNP elevated IV Lasix -- monitor for response-- increased to BID-- I/O not correct but patient reports increased urination -if not improved after IV lasix then may need to consider amiodarone toxicity -chest x ray: Cardiomegaly with interstitial pulmonary edema; Tiny bilateral pleural effusions. Home O2 study daily  Afib s/p DCCV -Back in NSR at this time -Continue Amiodarone, Coreg -Continue Xarelto  HTN -Continue Coreg, Avapro  Hypokalemia -replete  Family Communication/Anticipated D/C date and plan/Code Status   DVT prophylaxis: xarelto Code Status: Full Code.  Family Communication:  Disposition Plan: home once diuresed-- suspect 24 hours   Medical Consultants:    None.     Subjective:   Still very short of breath with exertion  Objective:    Vitals:   01/21/19 2314 01/22/19 0420 01/22/19 0833 01/22/19 1156  BP: (!) 143/57 (!) 153/65 (!) 130/56 (!) 158/56  Pulse: 62 60 60 64  Resp: 18 17 18 18   Temp: 97.8 F (36.6 C) (!) 97.5 F (36.4 C) 97.6 F (36.4 C) 97.8 F (36.6 C)  TempSrc: Oral Oral Oral Oral  SpO2: 98% 96% 91% 98%  Weight:      Height:        Intake/Output Summary (Last 24 hours) at 01/22/2019 1413 Last data filed at 01/22/2019 1200 Gross per 24 hour   Intake 720 ml  Output -  Net 720 ml   Filed Weights   01/20/19 1208  Weight: 78.9 kg    Exam: In bed, A+Ox3 Few crackles at bases, no wheezing rrr +Bs, soft, NT No LE edema   Data Reviewed:   I have personally reviewed following labs and imaging studies:  Labs: Labs show the following:   Basic Metabolic Panel: Recent Labs  Lab 01/20/19 1758 01/21/19 0547 01/22/19 0541  NA 140 140 139  K 3.8 3.9 3.3*  CL 107 107 103  CO2 27 25 26   GLUCOSE 105* 98 96  BUN 26* 26* 19  CREATININE 1.39* 1.38* 1.30*  CALCIUM 9.4 9.4 8.9   GFR Estimated Creatinine Clearance: 31.6 mL/min (A) (by C-G formula based on SCr of 1.3 mg/dL (H)). Liver Function Tests: Recent Labs  Lab 01/20/19 1758  AST 27  ALT 25  ALKPHOS 71  BILITOT 1.4*  PROT 5.8*  ALBUMIN 3.5   No results for input(s): LIPASE, AMYLASE in the last 168 hours. No results for input(s): AMMONIA in the last 168 hours. Coagulation profile No results for input(s): INR, PROTIME in the last 168 hours.  CBC: Recent Labs  Lab 01/20/19 1758 01/21/19 0547 01/22/19 0541  WBC 5.3 5.3 6.0  NEUTROABS 3.7  --   --   HGB 11.3* 10.7* 10.5*  HCT 36.0 33.6* 32.6*  MCV 96.3 95.2 95.6  PLT 218 201 197  Cardiac Enzymes: No results for input(s): CKTOTAL, CKMB, CKMBINDEX, TROPONINI in the last 168 hours. BNP (last 3 results) No results for input(s): PROBNP in the last 8760 hours. CBG: No results for input(s): GLUCAP in the last 168 hours. D-Dimer: No results for input(s): DDIMER in the last 72 hours. Hgb A1c: No results for input(s): HGBA1C in the last 72 hours. Lipid Profile: No results for input(s): CHOL, HDL, LDLCALC, TRIG, CHOLHDL, LDLDIRECT in the last 72 hours. Thyroid function studies: Recent Labs    01/20/19 1758  TSH 0.952   Anemia work up: No results for input(s): VITAMINB12, FOLATE, FERRITIN, TIBC, IRON, RETICCTPCT in the last 72 hours. Sepsis Labs: Recent Labs  Lab 01/20/19 1758 01/21/19 0547  01/22/19 0541  WBC 5.3 5.3 6.0    Microbiology No results found for this or any previous visit (from the past 240 hour(s)).  Procedures and diagnostic studies:  X-ray Chest Pa And Lateral  Result Date: 01/20/2019 CLINICAL DATA:  Respiratory failure EXAM: CHEST - 2 VIEW COMPARISON:  11/10/2017 FINDINGS: The cardio pericardial silhouette is enlarged. Diffuse interstitial opacity suggests edema. Tiny pleural effusions noted on the lateral projection. Left permanent pacemaker evident. Telemetry leads overlie the chest. IMPRESSION: Cardiomegaly with interstitial pulmonary edema. Tiny bilateral pleural effusions. Electronically Signed   By: Misty Stanley M.D.   On: 01/20/2019 18:50    Medications:   . ALPRAZolam  0.25 mg Oral QHS  . amiodarone  100 mg Oral Q breakfast  . carvedilol  25 mg Oral BID WC  . docusate sodium  100 mg Oral BID  . furosemide  40 mg Intravenous BID  . irbesartan  150 mg Oral QHS  . mouth rinse  15 mL Mouth Rinse BID  . pantoprazole  40 mg Oral Daily  . Rivaroxaban  15 mg Oral Q supper  . sodium chloride flush  3 mL Intravenous Q12H   Continuous Infusions:   LOS: 0 days   Geradine Girt  [facility]   How to contact the [facility] Attending or Consulting provider [place] or covering provider during after hours [place], for this patient?  1. Check the care team in [facility] and look for a) attending/consulting [facility] provider listed and b) the [facility] team listed 2. Log into [email or web address] and use [place] universal password to access. If you do not have the password, please contact the hospital operator. 3. Locate the [facility] provider you are looking for under [facility] and page to a number that you can be directly reached. 4. If you still have difficulty reaching the provider, please page the [facility] (Director on Call) for the Hospitalists listed on amion for assistance.  01/22/2019, 2:13 PM

## 2019-01-22 NOTE — Progress Notes (Signed)
Patient transport to [facility]. All belongings sent with patient. Report given.  Ave Filter, RN

## 2019-01-22 NOTE — Progress Notes (Signed)
SATURATION QUALIFICATIONS: (This note is used to comply with regulatory documentation for home oxygen)  Patient Saturations on Room Air at Rest = 89-90%  Patient Saturations on Room Air while Ambulating = 83-87%  Patient Saturations on 2 Liters of oxygen while Ambulating = 93-96%   NO SOB noted while ambulating but pt did have a little cough. Pt denied O2 usage at home.   Ave Filter, RN

## 2019-01-22 NOTE — Progress Notes (Signed)
01/22/2019 Patient transfer from [facility] to [place] at 1752. She is alert, oriented and ambulatory. Patient have scratches on left shoulders. She was place on telemetry and central monitor was made aware. Rico Sheehan RN

## 2019-01-23 LAB — BASIC METABOLIC PANEL
Anion gap: 9 (ref 5–15)
BUN: 21 mg/dL (ref 8–23)
CO2: 30 mmol/L (ref 22–32)
Calcium: 9.1 mg/dL (ref 8.9–10.3)
Chloride: 99 mmol/L (ref 98–111)
Creatinine, Ser: 1.31 mg/dL — ABNORMAL HIGH (ref 0.44–1.00)
GFR calc Af Amer: 43 mL/min — ABNORMAL LOW (ref >60)
GFR calc non Af Amer: 37 mL/min — ABNORMAL LOW (ref >60)
Glucose, Bld: 90 mg/dL (ref 70–99)
Potassium: 3.4 mmol/L — ABNORMAL LOW (ref 3.5–5.1)
SODIUM: 138 mmol/L (ref 135–145)

## 2019-01-23 LAB — CBC
HCT: 35.5 % — ABNORMAL LOW (ref 36.0–46.0)
Hemoglobin: 11.3 g/dL — ABNORMAL LOW (ref 12.0–15.0)
MCH: 29.9 pg (ref 26.0–34.0)
MCHC: 31.8 g/dL (ref 30.0–36.0)
MCV: 93.9 fL (ref 80.0–100.0)
Platelets: 227 10*3/uL (ref 150–400)
RBC: 3.78 MIL/uL — ABNORMAL LOW (ref 3.87–5.11)
RDW: 12.4 % (ref 11.5–15.5)
WBC: 5.1 10*3/uL (ref 4.0–10.5)
nRBC: 0 % (ref 0.0–0.2)

## 2019-01-23 MED ORDER — FUROSEMIDE 20 MG PO TABS: ORAL_TABLET | ORAL | 2 refills | Status: DC

## 2019-01-23 NOTE — H&P (Deleted)
Physician Discharge Summary  [name] JXB:[id number] DOB: 29-Aug-1930 DOA: 01/20/2019  PCP: Chesley Noon, MD  Admit date: 01/20/2019 Discharge date: 01/23/2019  Time spent: 20 minutes  Recommendations for Outpatient Follow-up:  1. Needs Lasix 40 mg twice daily orally for 4 days and then revert to 20 mg dosing-needs labs be met 1 week post discharge 2. Needs outpatient cardiology follow-up 3. Needs TSH LFTs outpatient 1 month as is on amiodarone  Discharge Diagnoses:  Principal Problem:   Acute respiratory failure with hypoxia (HCC) Active Problems:   Hypertension   PAF (paroxysmal atrial fibrillation) [facility])   Discharge Condition: Improved  Diet recommendation: Heart healthy low-salt 1.2 L  Filed Weights   01/20/19 1208 01/23/19 0723  Weight: 78.9 kg 74.1 kg    History of present illness:  85 year old female known history of sick sinus syndrome status post PPM A. fib on amiodarone hypertension hyperlipidemia came into the hospital for elective cardioversion Subsequently she was found to have shortness of breath and dyspnea on exertion which was new-on further questioning she had this antecedent to coming in for about 2 weeks     Hospital Course:  Acute respiratory failure secondary to overload of volume from decompensated likely diastolic heart failure, A. fib Placed on IV Lasix 40 twice daily good correction weight went from 1 7 5-1 63 chest x-ray showed cardiomegaly BNP was slightly elevated Patient was diuresed aggressively and then placed on oral Lasix 40 mg twice daily for 4 days post discharge and then revert to home dose will need outpatient labs on follow-up  A. fib status post DCCV Cardioverted back in sinus rhythm with PACs paced rhythm Further follow-up as outpatient  HTN resumed on Coreg, Avapro   Was mildly hypokalemic which was corrected She was stabilized for discharge she felt better was ambulating without oxygen and I explained dosing of  medications to her   Discharge Exam: Vitals:   01/23/19 0723 01/23/19 0917  BP: (!) 141/67 129/72  Pulse: 67   Resp: 14 16  Temp: 97.8 F (36.6 C) 98.1 F (36.7 C)  SpO2: 90% 96%    General: Awake alert pleasant no distress EOMI NCAT Cardiovascular: S1-S2 paced rhythm Respiratory: Chest clear no added sound Abdomen soft no rebound or guarding  Discharge Instructions   Discharge Instructions    Diet - low sodium heart healthy   Complete by:  As directed    Discharge instructions   Complete by:  As directed    Please take lasix as per dosing requested for 4 days and then revert to original dosing-you will need labs ain about 1 week post discharge to determine further management Please eat less sal, limit fluid intake to 1.2 L daily   Increase activity slowly   Complete by:  As directed      Allergies as of 01/23/2019      Reactions   Celebrex [celecoxib] Other (See Comments)   Reflux   Codeine Nausea Only   Ace Inhibitors Cough   Norvasc [amlodipine Besylate] Cough   Higher doses = VERY BAD COUGHING   Vioxx [rofecoxib] Other (See Comments)   Pt unsure of reaction      Medication List    TAKE these medications   acetaminophen 500 MG tablet Commonly known as:  TYLENOL Take 500-1,000 mg by mouth every 6 (six) hours as needed for headache (pain).   ALPRAZolam 0.5 MG tablet Commonly known as:  XANAX Take 0.25 mg by mouth at bedtime.   amiodarone  100 MG tablet Commonly known as:  PACERONE TAKE 1 TABLET(100 MG) BY MOUTH DAILY What changed:  See the new instructions.   antiseptic oral rinse Liqd 15 mLs by Mouth Rinse route as needed for dry mouth.   Biotin 1000 MCG tablet Take 1,000 mcg by mouth every other day. At noon   busPIRone 5 MG tablet Commonly known as:  BUSPAR Take 5 mg by mouth 2 (two) times daily as needed (for stress/anxiety).   carvedilol 25 MG tablet Commonly known as:  COREG Take 1 tablet (25 mg total) by mouth 2 (two) times daily with a  meal. Please keep upcoming appt in December for future refills. Thank you   furosemide 20 MG tablet Commonly known as:  LASIX Take 2 tablets [40 mg] twcie a day for 4 days and then go to your regular dose of 1 tablet [20 mg] daily after What changed:  additional instructions   irbesartan 300 MG tablet Commonly known as:  AVAPRO Take 0.5 tablets (150 mg total) by mouth at bedtime.   Magnesium 500 MG Caps Take 500 mg by mouth every other day. Before supper   omeprazole 40 MG capsule Commonly known as:  PRILOSEC Take 40 mg by mouth daily.   pyridOXINE 100 MG tablet Commonly known as:  VITAMIN B-6 Take 100 mg by mouth every other day. In the morning   SYSTANE OP Place 1 drop into both eyes at bedtime as needed (dry eyes).   Xarelto 15 MG Tabs tablet Generic drug:  Rivaroxaban TAKE 1 TABLET(15 MG) BY MOUTH DAILY WITH SUPPER What changed:  See the new instructions.      Allergies  Allergen Reactions  . Celebrex [Celecoxib] Other (See Comments)    Reflux  . Codeine Nausea Only  . Ace Inhibitors Cough  . Norvasc [Amlodipine Besylate] Cough    Higher doses = VERY BAD COUGHING   . Vioxx [Rofecoxib] Other (See Comments)    Pt unsure of reaction      The results of significant diagnostics from this hospitalization (including imaging, microbiology, ancillary and laboratory) are listed below for reference.    Significant Diagnostic Studies: X-ray Chest Pa And Lateral  Result Date: 01/20/2019 CLINICAL DATA:  Respiratory failure EXAM: CHEST - 2 VIEW COMPARISON:  11/10/2017 FINDINGS: The cardio pericardial silhouette is enlarged. Diffuse interstitial opacity suggests edema. Tiny pleural effusions noted on the lateral projection. Left permanent pacemaker evident. Telemetry leads overlie the chest. IMPRESSION: Cardiomegaly with interstitial pulmonary edema. Tiny bilateral pleural effusions. Electronically Signed   By: Misty Stanley M.D.   On: 01/20/2019 18:50    Microbiology: No  results found for this or any previous visit (from the past 240 hour(s)).   Labs: Basic Metabolic Panel: Recent Labs  Lab 01/20/19 1758 01/21/19 0547 01/22/19 0541 01/23/19 0326  NA 140 140 139 138  K 3.8 3.9 3.3* 3.4*  CL 107 107 103 99  CO2 _0 GLUCOSE 105* 98 96 90  BUN 26* 26* 19 21  CREATININE 1.39* 1.38* 1.30* 1.31*  CALCIUM 9.4 9.4 8.9 9.1   Liver Function Tests: Recent Labs  Lab 01/20/19 1758  AST 27  ALT 25  ALKPHOS 71  BILITOT 1.4*  PROT 5.8*  ALBUMIN 3.5   No results for input(s): LIPASE, AMYLASE in the last 168 hours. No results for input(s): AMMONIA in the last 168 hours. CBC: Recent Labs  Lab 01/20/19 1758 01/21/19 0547 01/22/19 0541 01/23/19 0326  WBC 5.3 5.3 6.0 5.1  NEUTROABS 3.7  --   --   --   HGB 11.3* 10.7* 10.5* 11.3*  HCT 36.0 33.6* 32.6* 35.5*  MCV 96.3 95.2 95.6 93.9  PLT 218 201 197 227   Cardiac Enzymes: No results for input(s): CKTOTAL, CKMB, CKMBINDEX, TROPONINI in the last 168 hours. BNP: BNP (last 3 results) Recent Labs    01/21/19 0547  BNP 280.0*    ProBNP (last 3 results) No results for input(s): PROBNP in the last 8760 hours.  CBG: No results for input(s): GLUCAP in the last 168 hours.     Signed:  Nita Sells MD   [facility] 01/23/2019, 10:10 AM

## 2019-01-25 NOTE — Progress Notes (Signed)
Remote pacemaker transmission.   

## 2019-01-25 NOTE — Discharge Summary (Signed)
Physician Discharge Summary  [name]:[id number] DOB: [birth year] DOA: 01/20/2019  PCP: Chesley Noon, MD  Admit date: 01/20/2019 Discharge date: 01/23/2019  Time spent: 20 minutes  Recommendations for Outpatient Follow-up:  1. Needs Lasix 40 mg twice daily orally for 4 days and then revert to 20 mg dosing-needs labs be met 1 week post discharge 2. Needs outpatient cardiology follow-up 3. Needs TSH LFTs outpatient 1 month as is on amiodarone  Discharge Diagnoses:  Principal Problem:   Acute respiratory failure with hypoxia (HCC) Active Problems:   Hypertension   PAF (paroxysmal atrial fibrillation) [facility])   Discharge Condition: Improved  Diet recommendation: Heart healthy low-salt 1.2 L      Filed Weights   01/20/19 1208 01/23/19 0723  Weight: 78.9 kg 74.1 kg    History of present illness:  85 year old female known history of sick sinus syndrome status post PPM A. fib on amiodarone hypertension hyperlipidemia came into the hospital for elective cardioversion Subsequently she was found to have shortness of breath and dyspnea on exertion which was new-on further questioning she had this antecedent to coming in for about 2 weeks     Hospital Course:  Acute respiratory failure secondary to overload of volume from decompensated likely diastolic heart failure, A. fib Placed on IV Lasix 40 twice daily good correction weight went from 1 7 5-1 63 chest x-ray showed cardiomegaly BNP was slightly elevated Patient was diuresed aggressively and then placed on oral Lasix 40 mg twice daily for 4 days post discharge and then revert to home dose will need outpatient labs on follow-up  A. fib status post DCCV Cardioverted back in sinus rhythm with PACs paced rhythm Further follow-up as outpatient  HTN resumed on Coreg, Avapro   Was mildly hypokalemic which was corrected She was stabilized for discharge she felt better was ambulating without oxygen and I  explained dosing of medications to her   Discharge Exam:     Vitals:   01/23/19 0723 01/23/19 0917  BP: (!) 141/67 129/72  Pulse: 67   Resp: 14 16  Temp: 97.8 F (36.6 C) 98.1 F (36.7 C)  SpO2: 90% 96%    General: Awake alert pleasant no distress EOMI NCAT Cardiovascular: S1-S2 paced rhythm Respiratory: Chest clear no added sound Abdomen soft no rebound or guarding  Discharge Instructions       Discharge Instructions    Diet - low sodium heart healthy   Complete by:  As directed    Discharge instructions   Complete by:  As directed    Please take lasix as per dosing requested for 4 days and then revert to original dosing-you will need labs ain about 1 week post discharge to determine further management Please eat less sal, limit fluid intake to 1.2 L daily   Increase activity slowly   Complete by:  As directed           Allergies as of 01/23/2019      Reactions   Celebrex [celecoxib] Other (See Comments)   Reflux   Codeine Nausea Only   Ace Inhibitors Cough   Norvasc [amlodipine Besylate] Cough   Higher doses = VERY BAD COUGHING   Vioxx [rofecoxib] Other (See Comments)   Pt unsure of reaction         Medication List    TAKE these medications   acetaminophen 500 MG tablet Commonly known as:  TYLENOL Take 500-1,000 mg by mouth every 6 (six) hours as needed for headache (pain).  ALPRAZolam 0.5 MG tablet Commonly known as:  XANAX Take 0.25 mg by mouth at bedtime.   amiodarone 100 MG tablet Commonly known as:  PACERONE TAKE 1 TABLET(100 MG) BY MOUTH DAILY What changed:  See the new instructions.   antiseptic oral rinse Liqd 15 mLs by Mouth Rinse route as needed for dry mouth.   Biotin 1000 MCG tablet Take 1,000 mcg by mouth every other day. At noon   busPIRone 5 MG tablet Commonly known as:  BUSPAR Take 5 mg by mouth 2 (two) times daily as needed (for stress/anxiety).   carvedilol 25 MG tablet Commonly known  as:  COREG Take 1 tablet (25 mg total) by mouth 2 (two) times daily with a meal. Please keep upcoming appt in December for future refills. Thank you   furosemide 20 MG tablet Commonly known as:  LASIX Take 2 tablets [40 mg] twcie a day for 4 days and then go to your regular dose of 1 tablet [20 mg] daily after What changed:  additional instructions   irbesartan 300 MG tablet Commonly known as:  AVAPRO Take 0.5 tablets (150 mg total) by mouth at bedtime.   Magnesium 500 MG Caps Take 500 mg by mouth every other day. Before supper   omeprazole 40 MG capsule Commonly known as:  PRILOSEC Take 40 mg by mouth daily.   pyridOXINE 100 MG tablet Commonly known as:  VITAMIN B-6 Take 100 mg by mouth every other day. In the morning   SYSTANE OP Place 1 drop into both eyes at bedtime as needed (dry eyes).   Xarelto 15 MG Tabs tablet Generic drug:  Rivaroxaban TAKE 1 TABLET(15 MG) BY MOUTH DAILY WITH SUPPER What changed:  See the new instructions.           Allergies  Allergen Reactions  . Celebrex [Celecoxib] Other (See Comments)    Reflux  . Codeine Nausea Only  . Ace Inhibitors Cough  . Norvasc [Amlodipine Besylate] Cough    Higher doses = VERY BAD COUGHING   . Vioxx [Rofecoxib] Other (See Comments)    Pt unsure of reaction      The results of significant diagnostics from this hospitalization (including imaging, microbiology, ancillary and laboratory) are listed below for reference.    Significant Diagnostic Studies:  ImagingResults  X-ray Chest Pa And Lateral  Result Date: 01/20/2019 CLINICAL DATA:  Respiratory failure EXAM: CHEST - 2 VIEW COMPARISON:  11/10/2017 FINDINGS: The cardio pericardial silhouette is enlarged. Diffuse interstitial opacity suggests edema. Tiny pleural effusions noted on the lateral projection. Left permanent pacemaker evident. Telemetry leads overlie the chest. IMPRESSION: Cardiomegaly with interstitial pulmonary edema.  Tiny bilateral pleural effusions. Electronically Signed   By: Misty Stanley M.D.   On: 01/20/2019 18:50     Microbiology: No results found for this or any previous visit (from the past 240 hour(s)).   Labs: Basic Metabolic Panel: LastLabs  Recent Labs  Lab 01/20/19 1758 01/21/19 0547 01/22/19 0541 01/23/19 0326  NA 140 140 139 138  K 3.8 3.9 3.3* 3.4*  CL 107 107 103 99  CO2 27 25 26 30   GLUCOSE 105* 98 96 90  BUN 26* 26* 19 21  CREATININE 1.39* 1.38* 1.30* 1.31*  CALCIUM 9.4 9.4 8.9 9.1     Liver Function Tests: LastLabs  Recent Labs  Lab 01/20/19 1758  AST 27  ALT 25  ALKPHOS 71  BILITOT 1.4*  PROT 5.8*  ALBUMIN 3.5     LastLabs  No results for  input(s): LIPASE, AMYLASE in the last 168 hours.   LastLabs  No results for input(s): AMMONIA in the last 168 hours.   CBC: LastLabs        Recent Labs  Lab 01/20/19 1758 01/21/19 0547 01/22/19 0541 01/23/19 0326  WBC 5.3 5.3 6.0 5.1  NEUTROABS 3.7  --   --   --   HGB 11.3* 10.7* 10.5* 11.3*  HCT 36.0 33.6* 32.6* 35.5*  MCV 96.3 95.2 95.6 93.9  PLT 218 201 197 227     Cardiac Enzymes: LastLabs  No results for input(s): CKTOTAL, CKMB, CKMBINDEX, TROPONINI in the last 168 hours.   BNP: BNP (last 3 results) RecentLabs(withinlast365days)  Recent Labs    01/21/19 0547  BNP 280.0*      ProBNP (last 3 results) RecentLabs(withinlast365days)  No results for input(s): PROBNP in the last 8760 hours.    CBG: LastLabs  No results for input(s): GLUCAP in the last 168 hours.       Signed:  Nita Sells MD   [facility] 01/23/2019, 10:10 AM

## 2019-01-28 DIAGNOSIS — I10 Essential (primary) hypertension: Secondary | ICD-10-CM | POA: Diagnosis not present

## 2019-01-28 DIAGNOSIS — N183 Chronic kidney disease, stage 3 (moderate): Secondary | ICD-10-CM | POA: Diagnosis not present

## 2019-02-03 ENCOUNTER — Other Ambulatory Visit: Payer: Self-pay | Admitting: Internal Medicine

## 2019-02-10 ENCOUNTER — Telehealth (HOSPITAL_COMMUNITY): Payer: Self-pay | Admitting: *Deleted

## 2019-02-10 NOTE — Telephone Encounter (Signed)
LMOM for pt to clbk.  Appt should be moved to Thursday April 9th with Adline Peals, [place].  Pt will require labs.  Awaiting pt clbk to resched.

## 2019-02-14 NOTE — Telephone Encounter (Signed)
Pt refused to leave her house for an appt. Telephone visit made as she does not have internet at her home.

## 2019-02-15 ENCOUNTER — Other Ambulatory Visit: Payer: Self-pay

## 2019-02-15 ENCOUNTER — Ambulatory Visit (HOSPITAL_COMMUNITY)
Admission: RE | Admit: 2019-02-15 | Discharge: 2019-02-15 | Disposition: A | Discharge status: Home / Self Care | Payer: Medicare Other | Source: Ambulatory Visit | Attending: Nurse Practitioner | Admitting: Nurse Practitioner

## 2019-02-15 DIAGNOSIS — I509 Heart failure, unspecified: Secondary | ICD-10-CM

## 2019-02-15 DIAGNOSIS — I480 Paroxysmal atrial fibrillation: Secondary | ICD-10-CM

## 2019-02-15 NOTE — Progress Notes (Signed)
Electrophysiology TeleHealth Note   Due to national recommendations of social distancing due to [place] 19, Audio telehealth visit is felt to be most appropriate for this patient at this time.  See MyChart message/consent below from today for patient consent regarding telehealth for the At[facility] Fibrillation Clinic.    Date:  02/15/2019   ID:  [name], DOB 10/02/1930, MRN [id number]  Location: home  Provider location: [address] [place], [place] [address] Evaluation Performed:Follow up  PCP:  Chesley Noon, MD  Primary Cardiologist:  none Primary Electrophysiologist: Dr. Caryl Comes  TD:VVOH   History of Present Illness: [name] is a 85 y.o. female  With known afib/PPM, who presents via audio  conferencing for a telehealth visit today.   The patient is referred for f/u [facility] admission.She was set up for cardioversion by Dr. Caryl Comes.  .hen she presented she was very short of breath and fluid overloaded . She was  admitted for diuresis and she was then successfully cardioverted.   She reports that she has been doing well since she has been home, She has had her usual energy and no noted shortness of breath.No afib. She is checking daily weights and if it is up by 2-3 lbs she will take an extra 20 mg lasix. She is avoiding salt and watching total liquids.. She did get to PCP office about one week after discharge and had labs drawn. She was told everything looked good. BP today showed readings of 150/84, 145/85,133/82. HR 80 and regular.   Today, she denies symptoms of palpitations, chest pain, shortness of breath, orthopnea, PND, lower extremity edema, claudication, dizziness, presyncope, syncope, bleeding, or neurologic sequela. The patient is tolerating medications without difficulties and is otherwise without complaint today.   she denies symptoms of cough, fevers, chills, or new SOB worrisome for COVID 19.   Atrial Fibrillation Risk Factors:  she does not have symptoms or  diagnosis of sleep apnea.  therapy. she does not have a history of rheumatic fever. she does not have a history of alcohol use. The patient does not have a history of early familial atrial fibrillation or other arrhythmias.  she has a BMI of There is no height or weight on file to calculate BMI.. There were no vitals filed for this visit.  Past Medical History:  Diagnosis Date   Anxiety    Aortic valve sclerosis    Atrial fibrillation (HCC)    Basal cell carcinoma    hx; near eyes   GERD (gastroesophageal reflux disease)    H/O one miscarriage    Hard of hearing    has hearing aides but does not wear    History of hiatal hernia    History of kidney stones    Hyperlipidemia    Hypertension    OA (osteoarthritis)    "right pointer; right hip" (01/05/2017)   Obesity    Renal insufficiency    SSS (sick sinus syndrome) ([place]) 01/05/2017   a. s/p MDT dual chamber PPM    Tinnitus    Varicose veins    Wears glasses    Past Surgical History:  Procedure Laterality Date   BREAST CYST EXCISION Right 1977   "benign"   CARDIOVERSION N/A 07/25/2016   Procedure: CARDIOVERSION;  Surgeon: Lelon Perla, MD;  Location: [facility] ENDOSCOPY;  Service: Cardiovascular;  Laterality: N/A;   CARDIOVERSION N/A 01/20/2019   Procedure: CARDIOVERSION;  Surgeon: Buford Dresser, MD;  Location: [place];  Service: Cardiovascular;  Laterality: N/A;   CATARACT EXTRACTION W/ INTRAOCULAR LENS  IMPLANT, BILATERAL Bilateral    DILATION AND CURETTAGE OF UTERUS     NASAL SEPTUM SURGERY     OVARIAN CYST REMOVAL  2002   PACEMAKER IMPLANT N/A 01/05/2017   Procedure: Pacemaker Implant;  Surgeon: Will Meredith Leeds, MD;  Location: [place] CV LAB;  Service: Cardiovascular;  Laterality: N/A;   TEE WITHOUT CARDIOVERSION N/A 12/16/2016   Procedure: TRANSESOPHAGEAL ECHOCARDIOGRAM (TEE);  Surgeon: Larey Dresser, MD;  Location: [place];  Service: Cardiovascular;  Laterality: N/A;     TEE WITHOUT CARDIOVERSION N/A 01/20/2019   Procedure: TRANSESOPHAGEAL ECHOCARDIOGRAM (TEE);  Surgeon: Buford Dresser, MD;  Location: [place];  Service: Cardiovascular;  Laterality: N/A;   TONSILLECTOMY     TOTAL HIP ARTHROPLASTY Left 12/18/2015   Procedure: LEFT TOTAL HIP ARTHROPLASTY ANTERIOR APPROACH;  Surgeon: Paralee Cancel, MD;  Location: [facility];  Service: Orthopedics;  Laterality: Left;     Current Outpatient Medications  Medication Sig Dispense Refill   acetaminophen (TYLENOL) 500 MG tablet Take 500-1,000 mg by mouth every 6 (six) hours as needed for headache (pain).     ALPRAZolam (XANAX) 0.5 MG tablet Take 0.25 mg by mouth at bedtime.      amiodarone (PACERONE) 100 MG tablet TAKE 1 TABLET(100 MG) BY MOUTH DAILY (Patient taking differently: Take 100 mg by mouth daily with breakfast. ) 90 tablet 3   antiseptic oral rinse (BIOTENE) LIQD 15 mLs by Mouth Rinse route as needed for dry mouth.     Biotin 1000 MCG tablet Take 1,000 mcg by mouth every other day. At noon     busPIRone (BUSPAR) 5 MG tablet Take 5 mg by mouth 2 (two) times daily as needed (for stress/anxiety).      carvedilol (COREG) 25 MG tablet TAKE 1 TABLET BY MOUTH TWICE DAILY WITH A MEAL 180 tablet 3   furosemide (LASIX) 20 MG tablet Take 2 tablets [40 mg] twcie a day for 4 days and then go to your regular dose of 1 tablet [20 mg] daily after 90 tablet 2   irbesartan (AVAPRO) 300 MG tablet Take 0.5 tablets (150 mg total) by mouth at bedtime. 45 tablet 3   Magnesium 500 MG CAPS Take 500 mg by mouth every other day. Before supper     omeprazole (PRILOSEC) 40 MG capsule Take 40 mg by mouth daily.     Polyethyl Glycol-Propyl Glycol (SYSTANE OP) Place 1 drop into both eyes at bedtime as needed (dry eyes).     pyridOXINE (VITAMIN B-6) 100 MG tablet Take 100 mg by mouth every other day. In the morning     XARELTO 15 MG TABS tablet TAKE 1 TABLET(15 MG) BY MOUTH DAILY WITH SUPPER (Patient taking  differently: Take 15 mg by mouth daily with supper. ) 30 tablet 6   No current facility-administered medications for this encounter.     Allergies:   Celebrex [celecoxib]; Codeine; Ace inhibitors; Norvasc [amlodipine besylate]; and Vioxx [rofecoxib]   Social History:  The patient  reports that she has never smoked. She has never used smokeless tobacco. She reports that she does not drink alcohol or use drugs.   Family History:  The patient's  family history includes Diabetes in her mother; Heart disease in her father; Hypertension in her brother and sister.    ROS:  Please see the history of present illness.   All other systems are personally reviewed and negative.   Exam: NA  Recent Labs: 01/20/2019: ALT  25; TSH 0.952 01/21/2019: B Natriuretic Peptide 280.0 01/23/2019: BUN 21; Creatinine, Ser 1.31; Hemoglobin 11.3; Platelets 227; Potassium 3.4; Sodium 138  personally reviewed    Other studies personally reviewed: Epic records reviewed     ASSESSMENT AND PLAN:   1. Persistent atrial fibrillation Successful cardioversion Continue  amiodarone 100 mg daily Continue carvedilol 25 mg bid  2. CHF Diuresed in hospital Weighing daily Avoiding salt Continue lasix 20 mg daly   This patients CHA2DS2-VASc Score and unadjusted Ischemic Stroke Rate (% per year) is equal to 4.8 % stroke rate/year from a score of 4  Above score calculated as 1 point each if present [CHF, HTN, DM, Vascular=MI/PAD/Aortic Plaque, Age if 65-74, or Female] Above score calculated as 2 points each if present [Age > 75, or Stroke/TIA/TE]  Continue xarelto 15 mg daily  COVID screen The patient does not have any symptoms that suggest any further testing/ screening at this time.  Social distancing reinforced today.   Follow-up:   6 months with Dr. Caryl Comes PPM remote report scheduled in [place]  Current medicines are reviewed at length with the patient today.   The patient does not have concerns regarding her  medicines.  The following changes were made today:  none  Labs/ tests ordered today include: none No orders of the defined types were placed in this encounter.   Patient Risk:  after full review of this patients clinical status, I feel that they are at high risk at this time.   Today, I have spent 20 minutes with the patient with telehealth technology discussing afib/HF concerns    Signed, Roderic Palau NP 02/15/2019 1:39 PM  Afib [facility] [address] [place], [place] [address] [phone]   I hereby voluntarily request, consent and authorize the [facility] and its employed or contracted physicians, physician assistants, nurse practitioners or other licensed health care professionals (the Practitioner), to provide me with telemedicine health care services (the "Services") as deemed necessary by the treating Practitioner. I acknowledge and consent to receive the Services by the Practitioner via telemedicine. I understand that the telemedicine visit will involve communicating with the Practitioner through live audiovisual communication technology and the disclosure of certain medical information by electronic transmission. I acknowledge that I have been given the opportunity to request an in-person assessment or other available alternative prior to the telemedicine visit and am voluntarily participating in the telemedicine visit.   I understand that I have the right to withhold or withdraw my consent to the use of telemedicine in the course of my care at any time, without affecting my right to future care or treatment, and that the Practitioner or I may terminate the telemedicine visit at any time. I understand that I have the right to inspect all information obtained and/or recorded in the course of the telemedicine visit and may receive copies of available information for a reasonable fee.  I understand that some of the potential risks of receiving the  Services via telemedicine include:   Delay or interruption in medical evaluation due to technological equipment failure or disruption;  Information transmitted may not be sufficient (e.g. poor resolution of images) to allow for appropriate medical decision making by the Practitioner; and/or  In rare instances, security protocols could fail, causing a breach of personal health information.   Furthermore, I acknowledge that it is my responsibility to provide information about my medical history, conditions and care that is complete and accurate to the best of my  ability. I acknowledge that Practitioner's advice, recommendations, and/or decision may be based on factors not within their control, such as incomplete or inaccurate data provided by me or distortions of diagnostic images or specimens that may result from electronic transmissions. I understand that the practice of medicine is not an exact science and that Practitioner makes no warranties or guarantees regarding treatment outcomes. I acknowledge that I will receive a copy of this consent concurrently upon execution via email to the email address I last provided but may also request a printed copy by calling the office of the [facility].  I understand that my insurance will be billed for this visit.   I have read or had this consent read to me.  I understand the contents of this consent, which adequately explains the benefits and risks of the Services being provided via telemedicine.  I have been provided ample opportunity to ask questions regarding this consent and the Services and have had my questions answered to my satisfaction.  I give my informed consent for the services to be provided through the use of telemedicine in my medical care  By participating in this telemedicine visit I agree to the above.

## 2019-04-18 ENCOUNTER — Ambulatory Visit (INDEPENDENT_AMBULATORY_CARE_PROVIDER_SITE_OTHER): Payer: Medicare Other | Admitting: *Deleted

## 2019-04-18 DIAGNOSIS — I495 Sick sinus syndrome: Secondary | ICD-10-CM

## 2019-04-18 DIAGNOSIS — I480 Paroxysmal atrial fibrillation: Secondary | ICD-10-CM

## 2019-04-18 LAB — CUP PACEART REMOTE DEVICE CHECK
Battery Remaining Longevity: 90 mo
Battery Voltage: 2.97 V
Brady Statistic AP VP Percent: 99.64 %
Brady Statistic AP VS Percent: 0.23 %
Brady Statistic AS VP Percent: 0.11 %
Brady Statistic AS VS Percent: 0.02 %
Brady Statistic RA Percent Paced: 99.87 %
Brady Statistic RV Percent Paced: 99.75 %
Date Time Interrogation Session: 20200608061630
Implantable Lead Implant Date: 20180226
Implantable Lead Implant Date: 20180226
Implantable Lead Location: 753859
Implantable Lead Location: 753860
Implantable Lead Model: 5076
Implantable Lead Model: 5076
Implantable Pulse Generator Implant Date: 20180226
Lead Channel Impedance Value: 285 Ohm
Lead Channel Impedance Value: 380 Ohm
Lead Channel Impedance Value: 418 Ohm
Lead Channel Impedance Value: 456 Ohm
Lead Channel Pacing Threshold Amplitude: 0.625 V
Lead Channel Pacing Threshold Amplitude: 1.25 V
Lead Channel Pacing Threshold Pulse Width: 0.4 ms
Lead Channel Pacing Threshold Pulse Width: 0.4 ms
Lead Channel Sensing Intrinsic Amplitude: 16.125 mV
Lead Channel Sensing Intrinsic Amplitude: 16.125 mV
Lead Channel Sensing Intrinsic Amplitude: 2.25 mV
Lead Channel Sensing Intrinsic Amplitude: 2.25 mV
Lead Channel Setting Pacing Amplitude: 2.5 V
Lead Channel Setting Pacing Amplitude: 2.5 V
Lead Channel Setting Pacing Pulse Width: 0.4 ms
Lead Channel Setting Sensing Sensitivity: 2.8 mV

## 2019-04-27 NOTE — Progress Notes (Signed)
Remote pacemaker transmission.   

## 2019-05-09 ENCOUNTER — Telehealth: Payer: Self-pay

## 2019-05-09 NOTE — Telephone Encounter (Signed)
I have started an Amiodarone PA through covermymeds. Key: [id number]

## 2019-05-10 NOTE — Telephone Encounter (Signed)
**Note De-Identified Yetzali Weld Obfuscation** The following messgae was received from covermymeds: [name] Key: [id number]  Outcome  Approved on June 29  Effective from 05/09/2019 through 05/08/2020.   I have notified the pts pharmacy of this approval.

## 2019-06-06 ENCOUNTER — Other Ambulatory Visit: Payer: Self-pay | Admitting: Internal Medicine

## 2019-06-07 NOTE — Telephone Encounter (Signed)
Xarelto 15mg  refill request received; pt is 85 yrs old, wt-79.1kg, Crea-1.31 on 01/23/2019, last seen by Roderic Palau on 02/15/2019 diagnosis Afib, CrCl-38.72ml/min; will send in refill to requested pharmacy.

## 2019-07-19 ENCOUNTER — Encounter: Payer: Self-pay | Admitting: Internal Medicine

## 2019-07-19 ENCOUNTER — Other Ambulatory Visit: Payer: Self-pay

## 2019-07-19 ENCOUNTER — Ambulatory Visit (INDEPENDENT_AMBULATORY_CARE_PROVIDER_SITE_OTHER): Payer: Medicare Other | Admitting: Internal Medicine

## 2019-07-19 ENCOUNTER — Ambulatory Visit (INDEPENDENT_AMBULATORY_CARE_PROVIDER_SITE_OTHER): Payer: Medicare Other | Admitting: *Deleted

## 2019-07-19 VITALS — BP 136/84 | HR 71 | Ht 64.0 in | Wt 168.6 lb

## 2019-07-19 DIAGNOSIS — Z950 Presence of cardiac pacemaker: Secondary | ICD-10-CM | POA: Diagnosis not present

## 2019-07-19 DIAGNOSIS — Z79899 Other long term (current) drug therapy: Secondary | ICD-10-CM | POA: Diagnosis not present

## 2019-07-19 DIAGNOSIS — I480 Paroxysmal atrial fibrillation: Secondary | ICD-10-CM

## 2019-07-19 DIAGNOSIS — I10 Essential (primary) hypertension: Secondary | ICD-10-CM

## 2019-07-19 DIAGNOSIS — I495 Sick sinus syndrome: Secondary | ICD-10-CM | POA: Diagnosis not present

## 2019-07-19 LAB — CUP PACEART REMOTE DEVICE CHECK
Battery Remaining Longevity: 81 mo
Battery Voltage: 2.96 V
Brady Statistic AP VP Percent: 99.73 %
Brady Statistic AP VS Percent: 0.18 %
Brady Statistic AS VP Percent: 0.07 %
Brady Statistic AS VS Percent: 0.01 %
Brady Statistic RA Percent Paced: 99.9 %
Brady Statistic RV Percent Paced: 99.8 %
Date Time Interrogation Session: 20200908061949
Implantable Lead Implant Date: 20180226
Implantable Lead Implant Date: 20180226
Implantable Lead Location: 753859
Implantable Lead Location: 753860
Implantable Lead Model: 5076
Implantable Lead Model: 5076
Implantable Pulse Generator Implant Date: 20180226
Lead Channel Impedance Value: 304 Ohm
Lead Channel Impedance Value: 380 Ohm
Lead Channel Impedance Value: 418 Ohm
Lead Channel Impedance Value: 475 Ohm
Lead Channel Pacing Threshold Amplitude: 0.625 V
Lead Channel Pacing Threshold Amplitude: 1.25 V
Lead Channel Pacing Threshold Pulse Width: 0.4 ms
Lead Channel Pacing Threshold Pulse Width: 0.4 ms
Lead Channel Sensing Intrinsic Amplitude: 16.375 mV
Lead Channel Sensing Intrinsic Amplitude: 16.375 mV
Lead Channel Sensing Intrinsic Amplitude: 2.125 mV
Lead Channel Sensing Intrinsic Amplitude: 2.125 mV
Lead Channel Setting Pacing Amplitude: 2.5 V
Lead Channel Setting Pacing Amplitude: 2.5 V
Lead Channel Setting Pacing Pulse Width: 0.4 ms
Lead Channel Setting Sensing Sensitivity: 2.8 mV

## 2019-07-19 LAB — CUP PACEART INCLINIC DEVICE CHECK
Battery Remaining Longevity: 84 mo
Battery Voltage: 2.96 V
Brady Statistic AP VP Percent: 99.69 %
Brady Statistic AP VS Percent: 0.21 %
Brady Statistic AS VP Percent: 0.09 %
Brady Statistic AS VS Percent: 0.02 %
Brady Statistic RA Percent Paced: 99.89 %
Brady Statistic RV Percent Paced: 99.78 %
Date Time Interrogation Session: 20200908173608
Implantable Lead Implant Date: 20180226
Implantable Lead Implant Date: 20180226
Implantable Lead Location: 753859
Implantable Lead Location: 753860
Implantable Lead Model: 5076
Implantable Lead Model: 5076
Implantable Pulse Generator Implant Date: 20180226
Lead Channel Impedance Value: 323 Ohm
Lead Channel Impedance Value: 437 Ohm
Lead Channel Impedance Value: 456 Ohm
Lead Channel Impedance Value: 494 Ohm
Lead Channel Pacing Threshold Amplitude: 0.75 V
Lead Channel Pacing Threshold Amplitude: 1.25 V
Lead Channel Pacing Threshold Pulse Width: 0.4 ms
Lead Channel Pacing Threshold Pulse Width: 0.4 ms
Lead Channel Sensing Intrinsic Amplitude: 16.5 mV
Lead Channel Sensing Intrinsic Amplitude: 2.1 mV
Lead Channel Setting Pacing Amplitude: 2.5 V
Lead Channel Setting Pacing Amplitude: 2.5 V
Lead Channel Setting Pacing Pulse Width: 0.4 ms
Lead Channel Setting Sensing Sensitivity: 2.8 mV

## 2019-07-19 MED ORDER — FUROSEMIDE 40 MG PO TABS: 40.0000 mg | ORAL_TABLET | Freq: Every day | ORAL | 3 refills | Status: DC

## 2019-07-19 NOTE — Progress Notes (Signed)
Patient Care Team: Chesley Noon, MD as PCP - General (Family Medicine)   HPI  [name] is a 85 y.o. female Seen for pacemaker follow-up with MR  She underwent device implantation by Dr. Carlyn Reichert 2/18 for symptomatic sinus node dysfunction and high-grade heart block resulting in nearly 100% ventricular pacing has paroxysmal atrial fibrillation for which she takes amiodarone and Rivaroxaban.  She is atrially dependent     She wanted an older doctor.  She had atrial fibrillation with CVR-- cardioverted 9/17.  Taking amiodarone   cardiaoverted in spring 2020 with a significant functional improvement.  Tolerating amiodarone without nausea or sun sensitivity.  No edema.  No abdominal distention.  Sleeping without nocturnal dyspnea or orthopnea.    Date TSH LFTs Cr K Hgb  1/18  1.14      14  5/18    1.63    8/18 1.24(12/18)  - 21 1.47  11.8 (4/18)  1/19  37 1.00 3.4 11.3  12/19 1.65 14 1.51>1.15 4.2 13.0  3/20 0.951 25 1.31 3.4 11.6      DATE TEST EF   2/18 Echo   60-65 % Eccentric MR-mild  12/18  Echo  55-60%             Past Medical History:  Diagnosis Date  . Anxiety   . Aortic valve sclerosis   . Atrial fibrillation ([place])   . Basal cell carcinoma    hx; near eyes  . GERD (gastroesophageal reflux disease)   . H/O one miscarriage   . Hard of hearing    has hearing aides but does not wear   . History of hiatal hernia   . History of kidney stones   . Hyperlipidemia   . Hypertension   . OA (osteoarthritis)    "right pointer; right hip" (01/05/2017)  . Obesity   . Renal insufficiency   . SSS (sick sinus syndrome) ([place]) 01/05/2017   a. s/p MDT dual chamber PPM   . Tinnitus   . Varicose veins   . Wears glasses     Past Surgical History:  Procedure Laterality Date  . BREAST CYST EXCISION Right 1977   "benign"  . CARDIOVERSION N/A 07/25/2016   Procedure: CARDIOVERSION;  Surgeon: Lelon Perla, MD;  Location: [facility] ENDOSCOPY;  Service: Cardiovascular;   Laterality: N/A;  . CARDIOVERSION N/A 01/20/2019   Procedure: CARDIOVERSION;  Surgeon: Buford Dresser, MD;  Location: [facility] ENDOSCOPY;  Service: Cardiovascular;  Laterality: N/A;  . CATARACT EXTRACTION W/ INTRAOCULAR LENS  IMPLANT, BILATERAL Bilateral   . DILATION AND CURETTAGE OF UTERUS    . NASAL SEPTUM SURGERY    . OVARIAN CYST REMOVAL  2002  . PACEMAKER IMPLANT N/A 01/05/2017   Procedure: Pacemaker Implant;  Surgeon: Will Meredith Leeds, MD;  Location: [place] CV LAB;  Service: Cardiovascular;  Laterality: N/A;  . TEE WITHOUT CARDIOVERSION N/A 12/16/2016   Procedure: TRANSESOPHAGEAL ECHOCARDIOGRAM (TEE);  Surgeon: Larey Dresser, MD;  Location: [place];  Service: Cardiovascular;  Laterality: N/A;  . TEE WITHOUT CARDIOVERSION N/A 01/20/2019   Procedure: TRANSESOPHAGEAL ECHOCARDIOGRAM (TEE);  Surgeon: Buford Dresser, MD;  Location: [facility] ENDOSCOPY;  Service: Cardiovascular;  Laterality: N/A;  . TONSILLECTOMY    . TOTAL HIP ARTHROPLASTY Left 12/18/2015   Procedure: LEFT TOTAL HIP ARTHROPLASTY ANTERIOR APPROACH;  Surgeon: Paralee Cancel, MD;  Location: [facility];  Service: Orthopedics;  Laterality: Left;    Current Outpatient Medications  Medication Sig Dispense Refill  . acetaminophen (  TYLENOL) 500 MG tablet Take 500-1,000 mg by mouth every 6 (six) hours as needed for headache (pain).    Marland Kitchen ALPRAZolam (XANAX) 0.5 MG tablet Take 0.25 mg by mouth at bedtime.     Marland Kitchen amiodarone (PACERONE) 100 MG tablet TAKE 1 TABLET(100 MG) BY MOUTH DAILY 90 tablet 3  . antiseptic oral rinse (BIOTENE) LIQD 15 mLs by Mouth Rinse route as needed for dry mouth.    . Biotin 1000 MCG tablet Take 1,000 mcg by mouth every other day. At noon    . busPIRone (BUSPAR) 5 MG tablet Take 5 mg by mouth 2 (two) times daily as needed (for stress/anxiety).     . carvedilol (COREG) 25 MG tablet TAKE 1 TABLET BY MOUTH TWICE DAILY WITH A MEAL 180 tablet 3  . furosemide (LASIX) 20 MG tablet Take 1 tablet by mouth daily.     . irbesartan (AVAPRO) 300 MG tablet Take 0.5 tablets (150 mg total) by mouth at bedtime. 45 tablet 3  . Magnesium 500 MG CAPS Take 500 mg by mouth every other day. Before supper    . omeprazole (PRILOSEC) 40 MG capsule Take 40 mg by mouth daily.    Marland Kitchen pyridOXINE (VITAMIN B-6) 100 MG tablet Take 100 mg by mouth every other day. In the morning    . XARELTO 15 MG TABS tablet TAKE 1 TABLET(15 MG) BY MOUTH DAILY WITH SUPPER 30 tablet 6   No current facility-administered medications for this visit.         Review of Systems negative except from HPI and PMH  Physical Exam BP 136/84   Pulse 71   Ht 5\' 4"  (1.626 m)   Wt 168 lb 9.6 oz (76.5 kg)   SpO2 97%   BMI 28.94 kg/m  Well developed and well nourished in no acute distress HENT normal Neck supple with JVP-8 +HJR Clear Device pocket well healed; without hematoma or erythema.  There is no tethering  Regular rate and rhythm, no  murmur Abd-soft with active BS No Clubbing cyanosis   edema Skin-warm and dry A & Oriented  Grossly normal sensory and motor function  ECG AV pacing at 71  Assessment and  Plan  Atrial fibrillation  Sinus node dysfunction  Pacemaker MRI Medtronic    The patient's device was interrogated and the information was fully reviewed.  The device was reprogrammed to fix the AV delay at 150/20 ms  renal failure grade 3  MR moderate  1AVB-profound   High Risk Medication Surveillance  Stressful home situation  Holding sinus rhythm.  Continue amiodarone; needs surveillance laboratories  Hypokalemia.  We will check it again.  Mildly volume overloaded; will increase her diuretics from 20--40 daily.  We will add potassium if indicated.     We spent more than 50% of our >25 min visit in face to face counseling regarding the above

## 2019-07-19 NOTE — Patient Instructions (Signed)
Medication Instructions:  Your physician has recommended you make the following change in your medication:   1. Increase your Lasix to 40mg , one tablet, once daily.    Labwork: You will have labs drawn today: CMP, CBC, TSH  Testing/Procedures: None ordered.  Follow-Up: Your physician recommends that you schedule a follow-up appointment in:   6 months with Dr. Caryl Comes  Any Other Special Instructions Will Be Listed Below (If Applicable).     If you need a refill on your cardiac medications before your next appointment, please call your pharmacy.

## 2019-07-20 LAB — COMPREHENSIVE METABOLIC PANEL
ALT: 12 IU/L (ref 0–32)
AST: 23 IU/L (ref 0–40)
Albumin/Globulin Ratio: 1.8 (ref 1.2–2.2)
Albumin: 4.4 g/dL (ref 3.6–4.6)
Alkaline Phosphatase: 96 IU/L (ref 39–117)
BUN/Creatinine Ratio: 14 (ref 12–28)
BUN: 26 mg/dL (ref 8–27)
Bilirubin Total: 0.4 mg/dL (ref 0.0–1.2)
CO2: 26 mmol/L (ref 20–29)
Calcium: 9.5 mg/dL (ref 8.7–10.3)
Chloride: 99 mmol/L (ref 96–106)
Creatinine, Ser: 1.81 mg/dL — ABNORMAL HIGH (ref 0.57–1.00)
GFR calc Af Amer: 29 mL/min/{1.73_m2} — ABNORMAL LOW (ref >59)
GFR calc non Af Amer: 25 mL/min/{1.73_m2} — ABNORMAL LOW (ref >59)
Globulin, Total: 2.4 g/dL (ref 1.5–4.5)
Glucose: 103 mg/dL — ABNORMAL HIGH (ref 65–99)
Potassium: 4.9 mmol/L (ref 3.5–5.2)
Sodium: 141 mmol/L (ref 134–144)
Total Protein: 6.8 g/dL (ref 6.0–8.5)

## 2019-07-20 LAB — CBC
Hematocrit: 39.2 % (ref 34.0–46.6)
Hemoglobin: 13.3 g/dL (ref 11.1–15.9)
MCH: 31.2 pg (ref 26.6–33.0)
MCHC: 33.9 g/dL (ref 31.5–35.7)
MCV: 92 fL (ref 79–97)
Platelets: 259 10*3/uL (ref 150–450)
RBC: 4.26 x10E6/uL (ref 3.77–5.28)
RDW: 12.1 % (ref 11.7–15.4)
WBC: 5.6 10*3/uL (ref 3.4–10.8)

## 2019-07-20 LAB — TSH: TSH: 1.19 u[IU]/mL (ref 0.450–4.500)

## 2019-08-01 ENCOUNTER — Telehealth: Payer: Self-pay

## 2019-08-01 NOTE — Telephone Encounter (Signed)
Notes recorded by Frederik Schmidt, RN on 08/01/2019 at 8:32 AM EDT  The patient has been notified of the result and verbalized understanding. All questions (if any) were answered.  Frederik Schmidt, RN 08/01/2019 8:32 AM

## 2019-08-01 NOTE — Telephone Encounter (Signed)
-----   Message from Dollene Primrose, RN sent at 07/29/2019  4:30 PM EDT -----  ----- Message ----- From: Deboraha Sprang, MD Sent: 07/27/2019  10:54 AM EDT To: Emily Filbert, RN, Dollene Primrose, RN  Please Inform Patient  Labs are normal x Cr has gone up again   Lets have her stop her avapro, check BP and  please recheck it in 2 weeks   Thanks

## 2019-08-04 ENCOUNTER — Encounter: Payer: Self-pay | Admitting: Cardiology

## 2019-08-04 NOTE — Progress Notes (Signed)
Remote pacemaker transmission.   

## 2019-08-22 ENCOUNTER — Telehealth: Payer: Self-pay | Admitting: Internal Medicine

## 2019-08-22 DIAGNOSIS — I480 Paroxysmal atrial fibrillation: Secondary | ICD-10-CM

## 2019-08-22 DIAGNOSIS — Z79899 Other long term (current) drug therapy: Secondary | ICD-10-CM

## 2019-08-22 DIAGNOSIS — R7989 Other specified abnormal findings of blood chemistry: Secondary | ICD-10-CM

## 2019-08-22 NOTE — Telephone Encounter (Signed)
Pt to have BMET 08/23/19.

## 2019-08-22 NOTE — Telephone Encounter (Signed)
Follow Up:    Pt wants to know if she is supposed to come in for lab work?. She talked to somebody on 08-01-19 and she thought she was supposed to come back in 2 weeks. I did not see an order for lab work.

## 2019-08-23 ENCOUNTER — Other Ambulatory Visit: Payer: Self-pay

## 2019-08-23 ENCOUNTER — Other Ambulatory Visit: Payer: Medicare Other

## 2019-08-23 DIAGNOSIS — I480 Paroxysmal atrial fibrillation: Secondary | ICD-10-CM

## 2019-08-23 DIAGNOSIS — R7989 Other specified abnormal findings of blood chemistry: Secondary | ICD-10-CM

## 2019-08-23 DIAGNOSIS — Z79899 Other long term (current) drug therapy: Secondary | ICD-10-CM

## 2019-08-23 LAB — BASIC METABOLIC PANEL
BUN/Creatinine Ratio: 17 (ref 12–28)
BUN: 26 mg/dL (ref 8–27)
CO2: 27 mmol/L (ref 20–29)
Calcium: 9.2 mg/dL (ref 8.7–10.3)
Chloride: 101 mmol/L (ref 96–106)
Creatinine, Ser: 1.57 mg/dL — ABNORMAL HIGH (ref 0.57–1.00)
GFR calc Af Amer: 34 mL/min/{1.73_m2} — ABNORMAL LOW (ref >59)
GFR calc non Af Amer: 30 mL/min/{1.73_m2} — ABNORMAL LOW (ref >59)
Glucose: 102 mg/dL — ABNORMAL HIGH (ref 65–99)
Potassium: 4.9 mmol/L (ref 3.5–5.2)
Sodium: 141 mmol/L (ref 134–144)

## 2019-08-24 ENCOUNTER — Telehealth: Payer: Self-pay | Admitting: Internal Medicine

## 2019-08-24 NOTE — Telephone Encounter (Signed)
I spoke with the patient regarding her repeat BMP from yesterday. She was instructed to stop Avapro ~ 1 month ago due to an increase in her renal function. She states when she came to the office yesterday for her lab draw she brought a copy of her BP readings with her.   I advised her I was calling from [place], so I did not have access to these, but would forward a note to the [employer] staff to look out for these so Dr. Caryl Comes can review.  She is aware we will call her back if needed based on these readings.  The patient voices understanding and is agreeable.

## 2019-09-06 ENCOUNTER — Other Ambulatory Visit: Payer: Self-pay | Admitting: *Deleted

## 2019-09-06 MED ORDER — AMIODARONE HCL 100 MG PO TABS: ORAL_TABLET | ORAL | 3 refills | Status: DC

## 2019-09-16 IMAGING — DX CHEST - 2 VIEW
2 series · 2 of 2 positions shown · non-contrast
Comparison: 11/10/2017

CLINICAL DATA: Respiratory failure

EXAM:
CHEST - 2 VIEW

[chest ap]
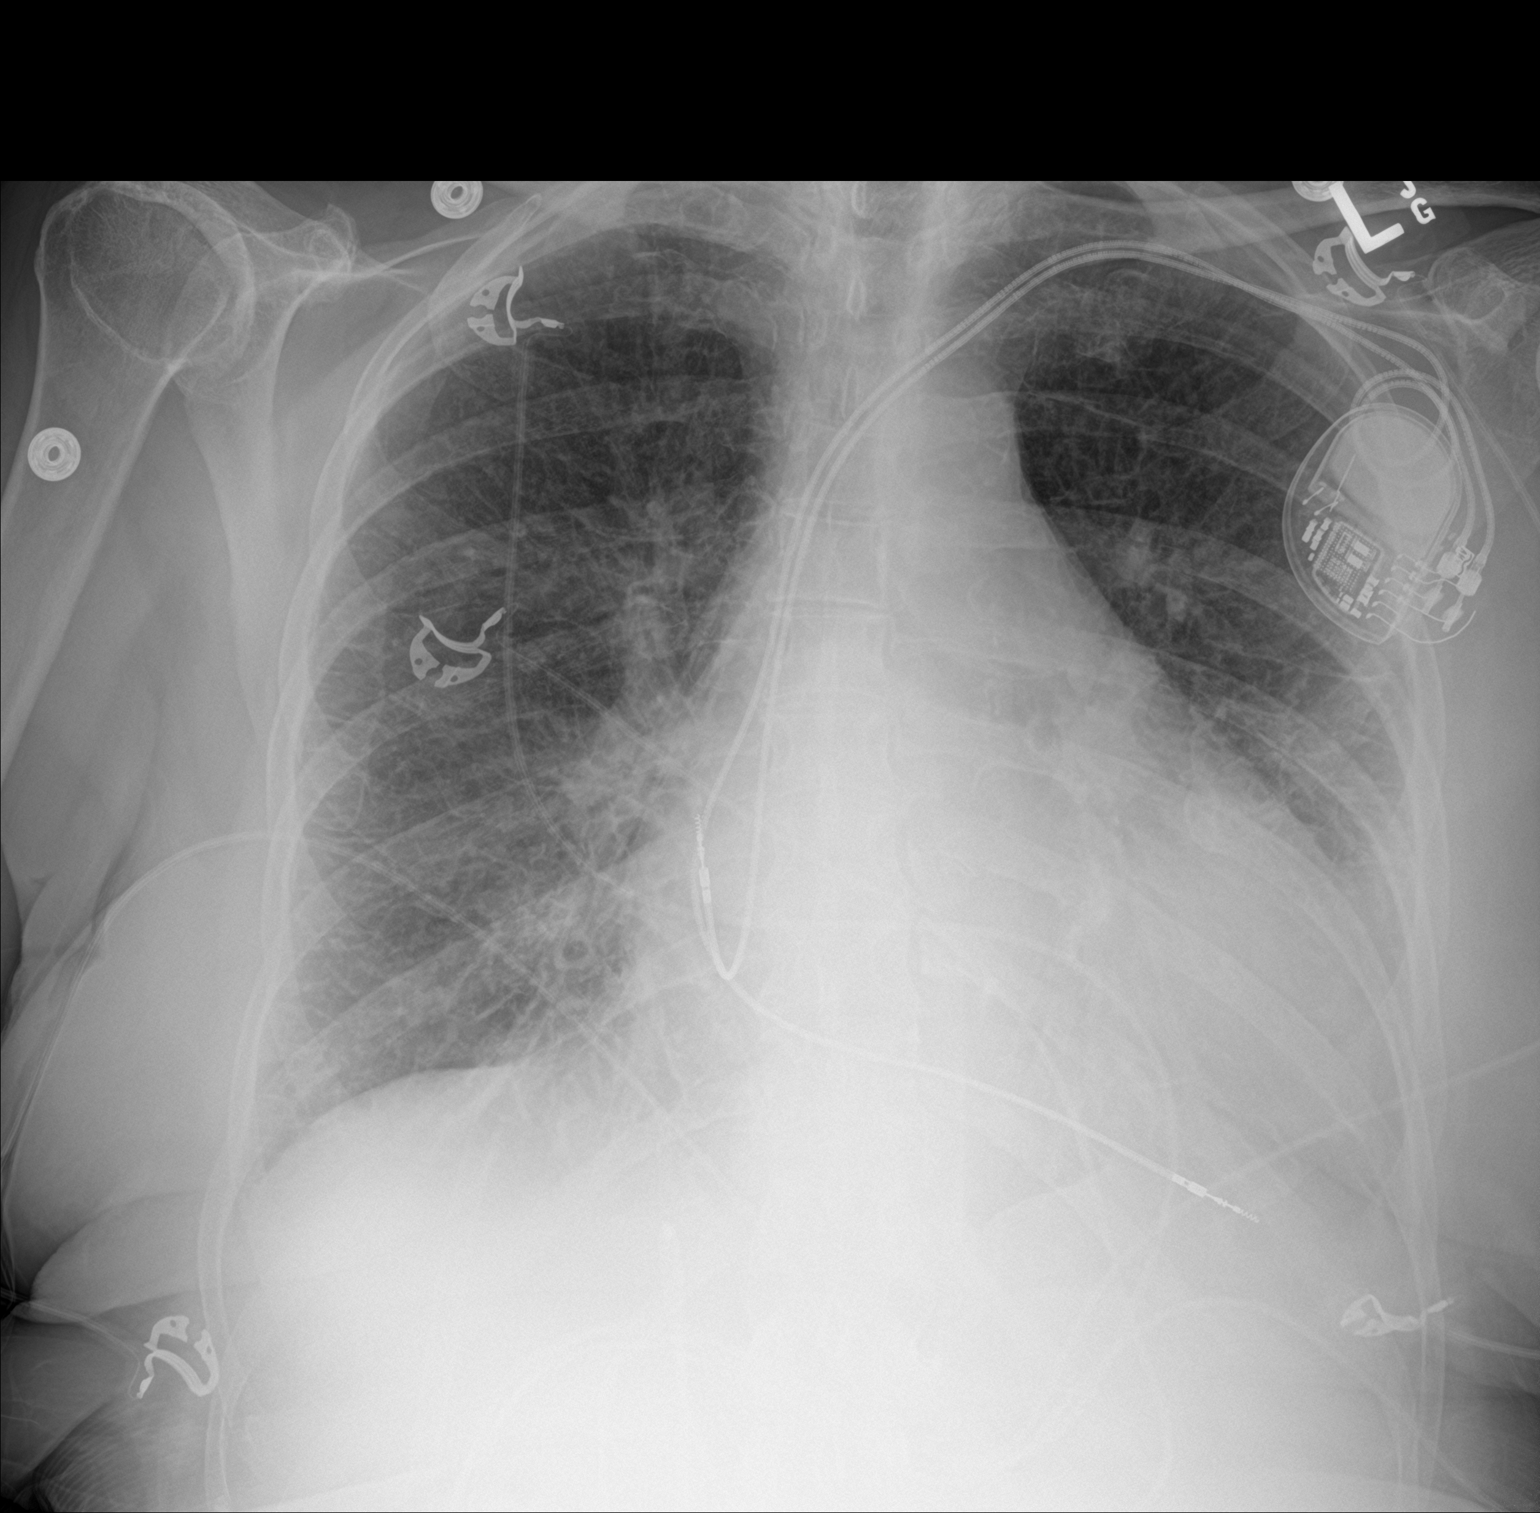

[chest lat]
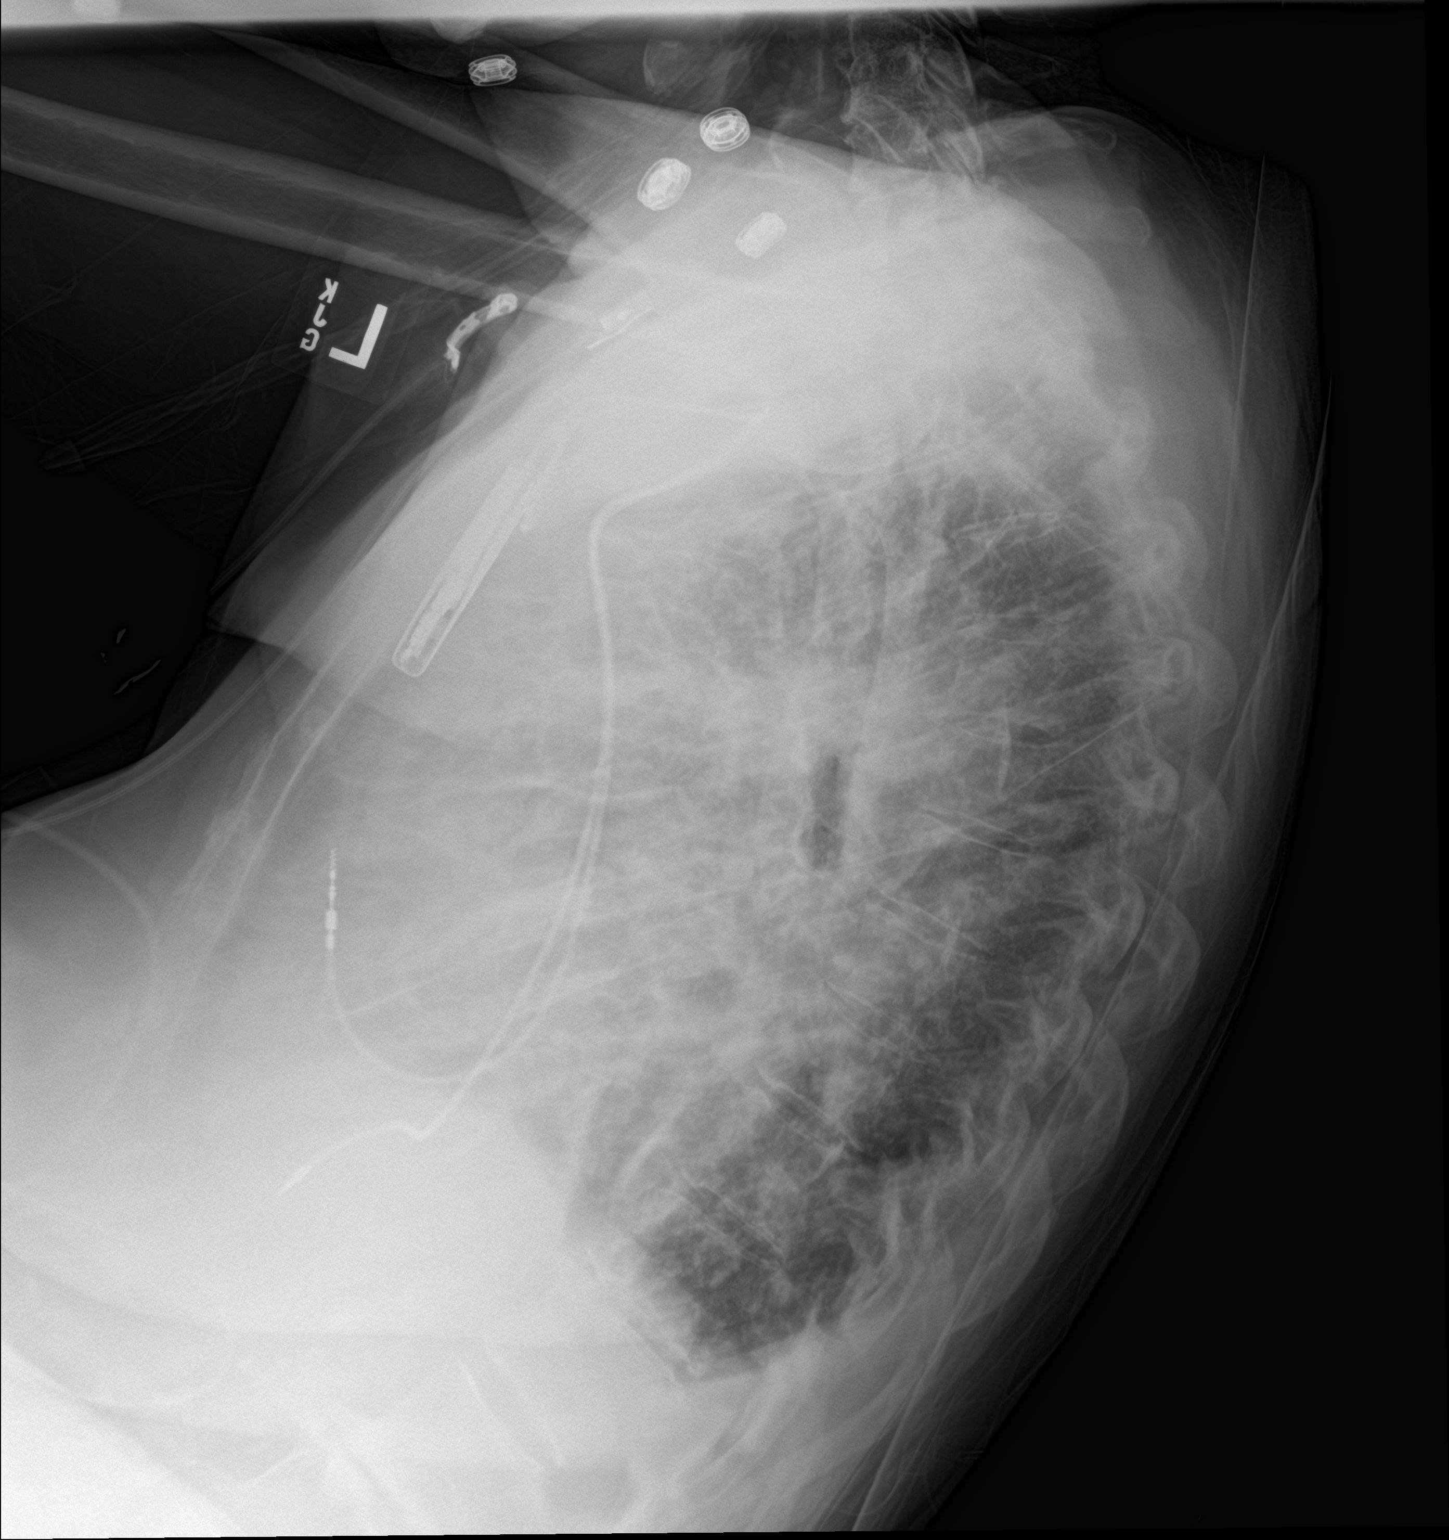

[2 of 2 positions shown; findings below may reference images not displayed]

FINDINGS: The cardio pericardial silhouette is enlarged. Diffuse interstitial
opacity suggests edema. Tiny pleural effusions noted on the lateral
projection. Left permanent pacemaker evident. Telemetry leads
overlie the chest.
IMPRESSION: Cardiomegaly with interstitial pulmonary edema.

Tiny bilateral pleural effusions.

## 2019-10-18 ENCOUNTER — Ambulatory Visit (INDEPENDENT_AMBULATORY_CARE_PROVIDER_SITE_OTHER): Payer: Medicare Other | Admitting: *Deleted

## 2019-10-18 DIAGNOSIS — R001 Bradycardia, unspecified: Secondary | ICD-10-CM | POA: Diagnosis not present

## 2019-10-18 LAB — CUP PACEART REMOTE DEVICE CHECK
Battery Remaining Longevity: 72 mo
Battery Voltage: 2.96 V
Brady Statistic AP VP Percent: 99.62 %
Brady Statistic AP VS Percent: 0.02 %
Brady Statistic AS VP Percent: 0.23 %
Brady Statistic AS VS Percent: 0.13 %
Brady Statistic RA Percent Paced: 99.75 %
Brady Statistic RV Percent Paced: 99.85 %
Date Time Interrogation Session: 20201208011555
Implantable Lead Implant Date: 20180226
Implantable Lead Implant Date: 20180226
Implantable Lead Location: 753859
Implantable Lead Location: 753860
Implantable Lead Model: 5076
Implantable Lead Model: 5076
Implantable Pulse Generator Implant Date: 20180226
Lead Channel Impedance Value: 285 Ohm
Lead Channel Impedance Value: 380 Ohm
Lead Channel Impedance Value: 418 Ohm
Lead Channel Impedance Value: 456 Ohm
Lead Channel Pacing Threshold Amplitude: 0.75 V
Lead Channel Pacing Threshold Amplitude: 1.25 V
Lead Channel Pacing Threshold Pulse Width: 0.4 ms
Lead Channel Pacing Threshold Pulse Width: 0.4 ms
Lead Channel Sensing Intrinsic Amplitude: 1 mV
Lead Channel Sensing Intrinsic Amplitude: 1 mV
Lead Channel Sensing Intrinsic Amplitude: 16 mV
Lead Channel Sensing Intrinsic Amplitude: 16.5 mV
Lead Channel Setting Pacing Amplitude: 2.5 V
Lead Channel Setting Pacing Amplitude: 2.5 V
Lead Channel Setting Pacing Pulse Width: 0.4 ms
Lead Channel Setting Sensing Sensitivity: 2.8 mV

## 2019-11-18 NOTE — Progress Notes (Signed)
PPM remote 

## 2019-11-23 ENCOUNTER — Telehealth: Payer: Self-pay | Admitting: Emergency Medicine

## 2019-11-23 NOTE — Telephone Encounter (Signed)
.   Received alert that showed AF with controlled v-rates that started @ 0747 and is ongoing. Patient reports that she has felt tired and had occasional episodes of SOB since around 8 am 11/22/19. She reports that she has not had any CP , dizziness, syncope or weight gain. She reports being under a lot of stress due to her husband being in a rehab facility and is preparing to bring him home to care for him. No missed doses of amiodorone, Coreg, or Xarelto.  She did report that she received her first dose of Covid vaccine yesterday and has had no increased or new symptoms since that vaccination.  Will call patient before lunch on 11/24/19 to assist with sending a remote transmission to determine if she is still in AF.

## 2019-11-24 NOTE — Telephone Encounter (Signed)
Spoke with patient. Assisted with manual transmission. Presenting rhythm AF/VP 80s. AF episode ongoing since 11/22/19 at 07:47. Normal PPM function. Pt reports she still has not had any additional SOB, but does feel fatigued. No chest pain, discomfort, or other symptoms. Remains compliant with amiodarone and Xarelto. V rates controlled. Offered f/u in AF Clinic, which pt is agreeable to. She previously saw D. Kayleen Memos, NP, via virtual visit in 02/2019. Pt requests in-office visit this time if possible.  Pt's husband is coming home from rehab on 12/07/19. She requests AF Clinic appointment prior to this date. Advised will route to [facility], AF Clinic scheduler for assistance. Encouraged to call back for any increased symptoms in the interim. Pt verbalizes understanding and denies questions at this time.

## 2019-11-24 NOTE — Telephone Encounter (Signed)
Spoke with pt and she is aware of appt 11/29/19 @1 :30 with Roderic Palau, NP.

## 2019-11-25 NOTE — Telephone Encounter (Signed)
Noted Thanks SK   

## 2019-11-29 ENCOUNTER — Ambulatory Visit (HOSPITAL_COMMUNITY)
Admission: RE | Admit: 2019-11-29 | Discharge: 2019-11-29 | Disposition: A | Discharge status: Home / Self Care | Payer: Medicare Other | Source: Ambulatory Visit | Attending: Nurse Practitioner | Admitting: Nurse Practitioner

## 2019-11-29 ENCOUNTER — Other Ambulatory Visit: Payer: Self-pay

## 2019-11-29 ENCOUNTER — Encounter (HOSPITAL_COMMUNITY): Payer: Self-pay | Admitting: Nurse Practitioner

## 2019-11-29 VITALS — BP 126/64 | HR 76 | Ht 64.0 in | Wt 166.4 lb

## 2019-11-29 DIAGNOSIS — I4819 Other persistent atrial fibrillation: Secondary | ICD-10-CM | POA: Diagnosis not present

## 2019-11-29 DIAGNOSIS — Z20822 Contact with and (suspected) exposure to covid-19: Secondary | ICD-10-CM | POA: Insufficient documentation

## 2019-11-29 DIAGNOSIS — Z7901 Long term (current) use of anticoagulants: Secondary | ICD-10-CM | POA: Insufficient documentation

## 2019-11-29 DIAGNOSIS — K219 Gastro-esophageal reflux disease without esophagitis: Secondary | ICD-10-CM | POA: Insufficient documentation

## 2019-11-29 DIAGNOSIS — M1611 Unilateral primary osteoarthritis, right hip: Secondary | ICD-10-CM | POA: Diagnosis not present

## 2019-11-29 DIAGNOSIS — I10 Essential (primary) hypertension: Secondary | ICD-10-CM | POA: Diagnosis not present

## 2019-11-29 DIAGNOSIS — I4892 Unspecified atrial flutter: Secondary | ICD-10-CM | POA: Diagnosis not present

## 2019-11-29 DIAGNOSIS — E785 Hyperlipidemia, unspecified: Secondary | ICD-10-CM | POA: Insufficient documentation

## 2019-11-29 DIAGNOSIS — Z01818 Encounter for other preprocedural examination: Secondary | ICD-10-CM | POA: Insufficient documentation

## 2019-11-29 DIAGNOSIS — I480 Paroxysmal atrial fibrillation: Secondary | ICD-10-CM | POA: Diagnosis not present

## 2019-11-29 DIAGNOSIS — D6869 Other thrombophilia: Secondary | ICD-10-CM

## 2019-11-29 DIAGNOSIS — F419 Anxiety disorder, unspecified: Secondary | ICD-10-CM | POA: Diagnosis not present

## 2019-11-29 DIAGNOSIS — Z79899 Other long term (current) drug therapy: Secondary | ICD-10-CM | POA: Diagnosis not present

## 2019-11-29 LAB — COMPREHENSIVE METABOLIC PANEL
ALT: 16 U/L (ref 0–44)
AST: 24 U/L (ref 15–41)
Albumin: 4 g/dL (ref 3.5–5.0)
Alkaline Phosphatase: 67 U/L (ref 38–126)
Anion gap: 12 (ref 5–15)
BUN: 32 mg/dL — ABNORMAL HIGH (ref 8–23)
CO2: 26 mmol/L (ref 22–32)
Calcium: 10.1 mg/dL (ref 8.9–10.3)
Chloride: 106 mmol/L (ref 98–111)
Creatinine, Ser: 1.9 mg/dL — ABNORMAL HIGH (ref 0.44–1.00)
GFR calc Af Amer: 27 mL/min — ABNORMAL LOW (ref >60)
GFR calc non Af Amer: 23 mL/min — ABNORMAL LOW (ref >60)
Glucose, Bld: 151 mg/dL — ABNORMAL HIGH (ref 70–99)
Potassium: 3.9 mmol/L (ref 3.5–5.1)
Sodium: 144 mmol/L (ref 135–145)
Total Bilirubin: 0.8 mg/dL (ref 0.3–1.2)
Total Protein: 6.9 g/dL (ref 6.5–8.1)

## 2019-11-29 LAB — CBC
HCT: 41.4 % (ref 36.0–46.0)
Hemoglobin: 13.2 g/dL (ref 12.0–15.0)
MCH: 30.3 pg (ref 26.0–34.0)
MCHC: 31.9 g/dL (ref 30.0–36.0)
MCV: 95.2 fL (ref 80.0–100.0)
Platelets: 246 10*3/uL (ref 150–400)
RBC: 4.35 MIL/uL (ref 3.87–5.11)
RDW: 12.4 % (ref 11.5–15.5)
WBC: 4.8 10*3/uL (ref 4.0–10.5)
nRBC: 0 % (ref 0.0–0.2)

## 2019-11-29 LAB — TSH: TSH: 1.428 u[IU]/mL (ref 0.350–4.500)

## 2019-11-29 MED ORDER — AMIODARONE HCL 100 MG PO TABS: 200.0000 mg | ORAL_TABLET | Freq: Every day | ORAL | 3 refills | Status: DC

## 2019-11-29 NOTE — Patient Instructions (Signed)
Cardioversion scheduled for Monday, January 25th  - Arrive at the [facility] and go to admitting at [place] not eat or drink anything after midnight the night prior to your procedure.  - Take all your morning medication with a sip of water prior to arrival.  - You will not be able to drive home after your procedure.   Increase Amiodarone 200mg  once a day

## 2019-11-29 NOTE — H&P (View-Only) (Signed)
Primary Care Physician: Chesley Noon, MD Referring Physician: Dr. Denna Haggard is a 86 y.o. female with a h/o paroxysmal afib that is in the afib clinic today after device clinic found pt to be in  afib since 11/22/19. She  has noted  increase in fatigue and shortness of breath with exertion. She went into afib the same day that she got her covid shot. CHA2DS2VASc score of at least 4, on xarelto 15 mg daily.  Today, she denies symptoms of palpitations, chest pain, shortness of breath, orthopnea, PND, lower extremity edema, dizziness, presyncope, syncope, or neurologic sequela. The patient is tolerating medications without difficulties and is otherwise without complaint today.   Past Medical History:  Diagnosis Date  . Anxiety   . Aortic valve sclerosis   . Atrial fibrillation ([place])   . Basal cell carcinoma    hx; near eyes  . GERD (gastroesophageal reflux disease)   . H/O one miscarriage   . Hard of hearing    has hearing aides but does not wear   . History of hiatal hernia   . History of kidney stones   . Hyperlipidemia   . Hypertension   . OA (osteoarthritis)    "right pointer; right hip" (01/05/2017)  . Obesity   . Renal insufficiency   . SSS (sick sinus syndrome) ([place]) 01/05/2017   a. s/p MDT dual chamber PPM   . Tinnitus   . Varicose veins   . Wears glasses    Past Surgical History:  Procedure Laterality Date  . BREAST CYST EXCISION Right 1977   "benign"  . CARDIOVERSION N/A 07/25/2016   Procedure: CARDIOVERSION;  Surgeon: Lelon Perla, MD;  Location: [facility] ENDOSCOPY;  Service: Cardiovascular;  Laterality: N/A;  . CARDIOVERSION N/A 01/20/2019   Procedure: CARDIOVERSION;  Surgeon: Buford Dresser, MD;  Location: [facility] ENDOSCOPY;  Service: Cardiovascular;  Laterality: N/A;  . CATARACT EXTRACTION W/ INTRAOCULAR LENS  IMPLANT, BILATERAL Bilateral   . DILATION AND CURETTAGE OF UTERUS    . NASAL SEPTUM SURGERY    . OVARIAN CYST REMOVAL  2002  .  PACEMAKER IMPLANT N/A 01/05/2017   Procedure: Pacemaker Implant;  Surgeon: Will Meredith Leeds, MD;  Location: [place] CV LAB;  Service: Cardiovascular;  Laterality: N/A;  . TEE WITHOUT CARDIOVERSION N/A 12/16/2016   Procedure: TRANSESOPHAGEAL ECHOCARDIOGRAM (TEE);  Surgeon: Larey Dresser, MD;  Location: [place];  Service: Cardiovascular;  Laterality: N/A;  . TEE WITHOUT CARDIOVERSION N/A 01/20/2019   Procedure: TRANSESOPHAGEAL ECHOCARDIOGRAM (TEE);  Surgeon: Buford Dresser, MD;  Location: [facility] ENDOSCOPY;  Service: Cardiovascular;  Laterality: N/A;  . TONSILLECTOMY    . TOTAL HIP ARTHROPLASTY Left 12/18/2015   Procedure: LEFT TOTAL HIP ARTHROPLASTY ANTERIOR APPROACH;  Surgeon: Paralee Cancel, MD;  Location: [facility] ORS;  Service: Orthopedics;  Laterality: Left;    Current Outpatient Medications  Medication Sig Dispense Refill  . acetaminophen (TYLENOL) 500 MG tablet Take 500-1,000 mg by mouth every 6 (six) hours as needed for headache (pain).    Marland Kitchen ALPRAZolam (XANAX) 0.5 MG tablet Take 0.25 mg by mouth at bedtime.     Marland Kitchen amiodarone (PACERONE) 100 MG tablet TAKE 1 TABLET(100 MG) BY MOUTH DAILY 90 tablet 3  . antiseptic oral rinse (BIOTENE) LIQD 15 mLs by Mouth Rinse route as needed for dry mouth.    . Biotin 1000 MCG tablet Take 1,000 mcg by mouth every other day. At noon    . busPIRone (BUSPAR) 5 MG tablet Take 5 mg  by mouth 2 (two) times daily as needed (for stress/anxiety).     . carvedilol (COREG) 25 MG tablet TAKE 1 TABLET BY MOUTH TWICE DAILY WITH A MEAL 180 tablet 3  . furosemide (LASIX) 40 MG tablet Take 1 tablet (40 mg total) by mouth daily. 90 tablet 3  . Magnesium 500 MG CAPS Take 500 mg by mouth every other day. Before supper    . omeprazole (PRILOSEC) 40 MG capsule Take 40 mg by mouth daily.    Marland Kitchen pyridOXINE (VITAMIN B-6) 100 MG tablet Take 100 mg by mouth every other day. In the morning    . XARELTO 15 MG TABS tablet TAKE 1 TABLET(15 MG) BY MOUTH DAILY WITH SUPPER 30 tablet 6    No current facility-administered medications for this encounter.    Allergies  Allergen Reactions  . Celebrex [Celecoxib] Other (See Comments)    Reflux  . Codeine Nausea Only  . Ace Inhibitors Cough  . Norvasc [Amlodipine Besylate] Cough    Higher doses = VERY BAD COUGHING   . Vioxx [Rofecoxib] Other (See Comments)    Pt unsure of reaction    Social History   Socioeconomic History  . Marital status: Married    Spouse name: Not on file  . Number of children: Not on file  . Years of education: Not on file  . Highest education level: Not on file  Occupational History  . Not on file  Tobacco Use  . Smoking status: Never Smoker  . Smokeless tobacco: Never Used  Substance and Sexual Activity  . Alcohol use: No  . Drug use: No  . Sexual activity: Not Currently  Other Topics Concern  . Not on file  Social History Narrative  . Not on file   Social Determinants of Health   Financial Resource Strain:   . Difficulty of Paying Living Expenses: Not on file  Food Insecurity:   . Worried About Charity fundraiser in the Last Year: Not on file  . Ran Out of Food in the Last Year: Not on file  Transportation Needs:   . Lack of Transportation (Medical): Not on file  . Lack of Transportation (Non-Medical): Not on file  Physical Activity:   . Days of Exercise per Week: Not on file  . Minutes of Exercise per Session: Not on file  Stress:   . Feeling of Stress : Not on file  Social Connections:   . Frequency of Communication with Friends and Family: Not on file  . Frequency of Social Gatherings with Friends and Family: Not on file  . Attends Religious Services: Not on file  . Active Member of Clubs or Organizations: Not on file  . Attends Archivist Meetings: Not on file  . Marital Status: Not on file  Intimate Partner Violence:   . Fear of Current or Ex-Partner: Not on file  . Emotionally Abused: Not on file  . Physically Abused: Not on file  . Sexually  Abused: Not on file    Family History  Problem Relation Age of Onset  . Diabetes Mother   . Heart disease Father   . Hypertension Sister   . Hypertension Brother     ROS- All systems are reviewed and negative except as per the HPI above  Physical Exam: Vitals:   11/29/19 1326  BP: 126/64  Pulse: 76  Weight: 75.5 kg  Height: 5\' 4"  (1.626 m)   Wt Readings from Last 3 Encounters:  11/29/19 75.5 kg  07/19/19 76.5 kg  01/23/19 74.1 kg    Labs: Lab Results  Component Value Date   NA 141 08/23/2019   K 4.9 08/23/2019   CL 101 08/23/2019   CO2 27 08/23/2019   GLUCOSE 102 (H) 08/23/2019   BUN 26 08/23/2019   CREATININE 1.57 (H) 08/23/2019   CALCIUM 9.2 08/23/2019   MG 2.0 10/21/2017   Lab Results  Component Value Date   INR 1.04 12/11/2015   No results found for: CHOL, HDL, LDLCALC, TRIG   GEN- The patient is well appearing, alert and oriented x 3 today.   Head- normocephalic, atraumatic Eyes-  Sclera clear, conjunctiva pink Ears- hearing intact Oropharynx- clear Neck- supple, no JVP Lymph- no cervical lymphadenopathy Lungs- Clear to ausculation bilaterally, normal work of breathing Heart- Regular rate and rhythm, no murmurs, rubs or gallops, PMI not laterally displaced GI- soft, NT, ND, + BS Extremities- no clubbing, cyanosis, or edema MS- no significant deformity or atrophy Skin- no rash or lesion Psych- euthymic mood, full affect Neuro- strength and sensation are intact  EKG- v paced at 76 bpm,  qrs int 186 ms, qtc 578 ms    Assessment and Plan: 1. Symptomatic persisitent afib since 11/22/19 Will plan on cardioversion  Last cardioversion was in 01/2019, pt became fluid overloaded as she was not  cardioverted promptly  Weight  stable today  Increase amiodarone to 200 mg daily until one week after cardioverion Continue  carvedilol to 25 mg bid   Cbc/bmet/covid today  2. CHA2DS2VASc score of 4 States no missed doses of xarelto for at least 3 weeks    F/u in afib clinic one week after [place]. Charles Niese, [facility] [address] [place], [place] [address] [phone]

## 2019-11-29 NOTE — Progress Notes (Signed)
Primary Care Physician: Mary Noon, MD Referring Physician: Dr. Denna Roberson is a 86 y.o. female with a h/o paroxysmal afib that is in the afib clinic today after device clinic found pt to be in  afib since 11/22/19. She  has noted  increase in fatigue and shortness of breath with exertion. She went into afib the same day that she got her covid shot. CHA2DS2VASc score of at least 4, on xarelto 15 mg daily.  Today, she denies symptoms of palpitations, chest pain, shortness of breath, orthopnea, PND, lower extremity edema, dizziness, presyncope, syncope, or neurologic sequela. The patient is tolerating medications without difficulties and is otherwise without complaint today.   Past Medical History:  Diagnosis Date  . Anxiety   . Aortic valve sclerosis   . Atrial fibrillation ([place])   . Basal cell carcinoma    hx; near eyes  . GERD (gastroesophageal reflux disease)   . H/O one miscarriage   . Hard of hearing    has hearing aides but does not wear   . History of hiatal hernia   . History of kidney stones   . Hyperlipidemia   . Hypertension   . OA (osteoarthritis)    "right pointer; right hip" (01/05/2017)  . Obesity   . Renal insufficiency   . SSS (sick sinus syndrome) ([place]) 01/05/2017   a. s/p MDT dual chamber PPM   . Tinnitus   . Varicose veins   . Wears glasses    Past Surgical History:  Procedure Laterality Date  . BREAST CYST EXCISION Right 1977   "benign"  . CARDIOVERSION N/A 07/25/2016   Procedure: CARDIOVERSION;  Surgeon: Lelon Perla, MD;  Location: [facility] ENDOSCOPY;  Service: Cardiovascular;  Laterality: N/A;  . CARDIOVERSION N/A 01/20/2019   Procedure: CARDIOVERSION;  Surgeon: Buford Dresser, MD;  Location: [facility] ENDOSCOPY;  Service: Cardiovascular;  Laterality: N/A;  . CATARACT EXTRACTION W/ INTRAOCULAR LENS  IMPLANT, BILATERAL Bilateral   . DILATION AND CURETTAGE OF UTERUS    . NASAL SEPTUM SURGERY    . OVARIAN CYST REMOVAL  2002  .  PACEMAKER IMPLANT N/A 01/05/2017   Procedure: Pacemaker Implant;  Surgeon: Will Meredith Leeds, MD;  Location: [place] CV LAB;  Service: Cardiovascular;  Laterality: N/A;  . TEE WITHOUT CARDIOVERSION N/A 12/16/2016   Procedure: TRANSESOPHAGEAL ECHOCARDIOGRAM (TEE);  Surgeon: Larey Dresser, MD;  Location: [place];  Service: Cardiovascular;  Laterality: N/A;  . TEE WITHOUT CARDIOVERSION N/A 01/20/2019   Procedure: TRANSESOPHAGEAL ECHOCARDIOGRAM (TEE);  Surgeon: Buford Dresser, MD;  Location: [facility] ENDOSCOPY;  Service: Cardiovascular;  Laterality: N/A;  . TONSILLECTOMY    . TOTAL HIP ARTHROPLASTY Left 12/18/2015   Procedure: LEFT TOTAL HIP ARTHROPLASTY ANTERIOR APPROACH;  Surgeon: Paralee Cancel, MD;  Location: [facility] ORS;  Service: Orthopedics;  Laterality: Left;    Current Outpatient Medications  Medication Sig Dispense Refill  . acetaminophen (TYLENOL) 500 MG tablet Take 500-1,000 mg by mouth every 6 (six) hours as needed for headache (pain).    Marland Kitchen ALPRAZolam (XANAX) 0.5 MG tablet Take 0.25 mg by mouth at bedtime.     Marland Kitchen amiodarone (PACERONE) 100 MG tablet TAKE 1 TABLET(100 MG) BY MOUTH DAILY 90 tablet 3  . antiseptic oral rinse (BIOTENE) LIQD 15 mLs by Mouth Rinse route as needed for dry mouth.    . Biotin 1000 MCG tablet Take 1,000 mcg by mouth every other day. At [place]    . busPIRone (BUSPAR) 5 MG tablet Take 5 mg  by mouth 2 (two) times daily as needed (for stress/anxiety).     . carvedilol (COREG) 25 MG tablet TAKE 1 TABLET BY MOUTH TWICE DAILY WITH A MEAL 180 tablet 3  . furosemide (LASIX) 40 MG tablet Take 1 tablet (40 mg total) by mouth daily. 90 tablet 3  . Magnesium 500 MG CAPS Take 500 mg by mouth every other day. Before supper    . omeprazole (PRILOSEC) 40 MG capsule Take 40 mg by mouth daily.    Marland Kitchen pyridOXINE (VITAMIN B-6) 100 MG tablet Take 100 mg by mouth every other day. In the morning    . XARELTO 15 MG TABS tablet TAKE 1 TABLET(15 MG) BY MOUTH DAILY WITH SUPPER 30 tablet 6    No current facility-administered medications for this encounter.    Allergies  Allergen Reactions  . Celebrex [Celecoxib] Other (See Comments)    Reflux  . Codeine Nausea Only  . Ace Inhibitors Cough  . Norvasc [Amlodipine Besylate] Cough    Higher doses = VERY BAD COUGHING   . Vioxx [Rofecoxib] Other (See Comments)    Pt unsure of reaction    Social History   Socioeconomic History  . Marital status: Married    Spouse name: Not on file  . Number of children: Not on file  . Years of education: Not on file  . Highest education level: Not on file  Occupational History  . Not on file  Tobacco Use  . Smoking status: Never Smoker  . Smokeless tobacco: Never Used  Substance and Sexual Activity  . Alcohol use: No  . Drug use: No  . Sexual activity: Not Currently  Other Topics Concern  . Not on file  Social History Narrative  . Not on file   Social Determinants of Health   Financial Resource Strain:   . Difficulty of Paying Living Expenses: Not on file  Food Insecurity:   . Worried About Charity fundraiser in the Last Year: Not on file  . Ran Out of Food in the Last Year: Not on file  Transportation Needs:   . Lack of Transportation (Medical): Not on file  . Lack of Transportation (Non-Medical): Not on file  Physical Activity:   . Days of Exercise per Week: Not on file  . Minutes of Exercise per Session: Not on file  Stress:   . Feeling of Stress : Not on file  Social Connections:   . Frequency of Communication with Friends and Family: Not on file  . Frequency of Social Gatherings with Friends and Family: Not on file  . Attends Religious Services: Not on file  . Active Member of Clubs or Organizations: Not on file  . Attends Archivist Meetings: Not on file  . Marital Status: Not on file  Intimate Partner Violence:   . Fear of Current or Ex-Partner: Not on file  . Emotionally Abused: Not on file  . Physically Abused: Not on file  . Sexually  Abused: Not on file    Family History  Problem Relation Age of Onset  . Diabetes Mother   . Heart disease Father   . Hypertension Sister   . Hypertension Brother     ROS- All systems are reviewed and negative except as per the HPI above  Physical Exam: Vitals:   11/29/19 1326  BP: 126/64  Pulse: 76  Weight: 75.5 kg  Height: 5\' 4"  (1.626 m)   Wt Readings from Last 3 Encounters:  11/29/19 75.5 kg  07/19/19 76.5 kg  01/23/19 74.1 kg    Labs: Lab Results  Component Value Date   NA 141 08/23/2019   K 4.9 08/23/2019   CL 101 08/23/2019   CO2 27 08/23/2019   GLUCOSE 102 (H) 08/23/2019   BUN 26 08/23/2019   CREATININE 1.57 (H) 08/23/2019   CALCIUM 9.2 08/23/2019   MG 2.0 10/21/2017   Lab Results  Component Value Date   INR 1.04 12/11/2015   No results found for: CHOL, HDL, LDLCALC, TRIG   GEN- The patient is well appearing, alert and oriented x 3 today.   Head- normocephalic, atraumatic Eyes-  Sclera clear, conjunctiva pink Ears- hearing intact Oropharynx- clear Neck- supple, no JVP Lymph- no cervical lymphadenopathy Lungs- Clear to ausculation bilaterally, normal work of breathing Heart- Regular rate and rhythm, no murmurs, rubs or gallops, PMI not laterally displaced GI- soft, NT, ND, + BS Extremities- no clubbing, cyanosis, or edema MS- no significant deformity or atrophy Skin- no rash or lesion Psych- euthymic mood, full affect Neuro- strength and sensation are intact  EKG- v paced at 76 bpm,  qrs int 186 ms, qtc 578 ms    Assessment and Plan: 1. Symptomatic persisitent afib since 11/22/19 Will plan on cardioversion  Last cardioversion was in 01/2019, pt became fluid overloaded as she was not  cardioverted promptly  Weight  stable today  Increase amiodarone to 200 mg daily until one week after cardioverion Continue  carvedilol to 25 mg bid   Cbc/bmet/covid today  2. CHA2DS2VASc score of 4 States no missed doses of xarelto for at least 3 weeks    F/u in afib clinic one week after [place]. [name], [facility] [address] [place], [place] [address] [phone]

## 2019-12-01 ENCOUNTER — Other Ambulatory Visit (HOSPITAL_COMMUNITY)
Admission: RE | Admit: 2019-12-01 | Discharge: 2019-12-01 | Disposition: A | Discharge status: Home / Self Care | Payer: Medicare Other | Source: Ambulatory Visit | Attending: Cardiology | Admitting: Cardiology

## 2019-12-01 DIAGNOSIS — Z01812 Encounter for preprocedural laboratory examination: Secondary | ICD-10-CM | POA: Insufficient documentation

## 2019-12-01 DIAGNOSIS — Z20822 Contact with and (suspected) exposure to covid-19: Secondary | ICD-10-CM | POA: Diagnosis not present

## 2019-12-01 LAB — SARS CORONAVIRUS 2 (TAT 6-24 HRS): SARS Coronavirus 2: NEGATIVE

## 2019-12-05 ENCOUNTER — Ambulatory Visit (HOSPITAL_COMMUNITY): Payer: Medicare Other | Admitting: Anesthesiology

## 2019-12-05 ENCOUNTER — Other Ambulatory Visit: Payer: Self-pay

## 2019-12-05 ENCOUNTER — Ambulatory Visit (HOSPITAL_COMMUNITY)
Admission: RE | Admit: 2019-12-05 | Discharge: 2019-12-05 | Disposition: A | Discharge status: Home / Self Care | Payer: Medicare Other | Attending: Cardiology | Admitting: Cardiology

## 2019-12-05 ENCOUNTER — Encounter (HOSPITAL_COMMUNITY): Payer: Self-pay | Admitting: Cardiology

## 2019-12-05 ENCOUNTER — Encounter (HOSPITAL_COMMUNITY)
Admission: RE | Disposition: A | Discharge status: Home / Self Care | Payer: Self-pay | Source: Home / Self Care | Attending: Cardiology

## 2019-12-05 DIAGNOSIS — E785 Hyperlipidemia, unspecified: Secondary | ICD-10-CM | POA: Diagnosis not present

## 2019-12-05 DIAGNOSIS — K219 Gastro-esophageal reflux disease without esophagitis: Secondary | ICD-10-CM | POA: Insufficient documentation

## 2019-12-05 DIAGNOSIS — I358 Other nonrheumatic aortic valve disorders: Secondary | ICD-10-CM | POA: Insufficient documentation

## 2019-12-05 DIAGNOSIS — N289 Disorder of kidney and ureter, unspecified: Secondary | ICD-10-CM | POA: Diagnosis not present

## 2019-12-05 DIAGNOSIS — I10 Essential (primary) hypertension: Secondary | ICD-10-CM | POA: Diagnosis not present

## 2019-12-05 DIAGNOSIS — H9190 Unspecified hearing loss, unspecified ear: Secondary | ICD-10-CM | POA: Diagnosis not present

## 2019-12-05 DIAGNOSIS — E669 Obesity, unspecified: Secondary | ICD-10-CM | POA: Insufficient documentation

## 2019-12-05 DIAGNOSIS — I4819 Other persistent atrial fibrillation: Secondary | ICD-10-CM | POA: Diagnosis not present

## 2019-12-05 DIAGNOSIS — Z79899 Other long term (current) drug therapy: Secondary | ICD-10-CM | POA: Insufficient documentation

## 2019-12-05 DIAGNOSIS — Z6828 Body mass index (BMI) 28.0-28.9, adult: Secondary | ICD-10-CM | POA: Insufficient documentation

## 2019-12-05 DIAGNOSIS — M1990 Unspecified osteoarthritis, unspecified site: Secondary | ICD-10-CM | POA: Insufficient documentation

## 2019-12-05 DIAGNOSIS — Z7901 Long term (current) use of anticoagulants: Secondary | ICD-10-CM | POA: Insufficient documentation

## 2019-12-05 DIAGNOSIS — Z888 Allergy status to other drugs, medicaments and biological substances status: Secondary | ICD-10-CM | POA: Diagnosis not present

## 2019-12-05 DIAGNOSIS — Z8249 Family history of ischemic heart disease and other diseases of the circulatory system: Secondary | ICD-10-CM | POA: Diagnosis not present

## 2019-12-05 DIAGNOSIS — Z96642 Presence of left artificial hip joint: Secondary | ICD-10-CM | POA: Insufficient documentation

## 2019-12-05 DIAGNOSIS — Z885 Allergy status to narcotic agent status: Secondary | ICD-10-CM | POA: Insufficient documentation

## 2019-12-05 DIAGNOSIS — J9601 Acute respiratory failure with hypoxia: Secondary | ICD-10-CM | POA: Diagnosis not present

## 2019-12-05 DIAGNOSIS — I480 Paroxysmal atrial fibrillation: Secondary | ICD-10-CM | POA: Diagnosis not present

## 2019-12-05 DIAGNOSIS — Z950 Presence of cardiac pacemaker: Secondary | ICD-10-CM | POA: Insufficient documentation

## 2019-12-05 DIAGNOSIS — I495 Sick sinus syndrome: Secondary | ICD-10-CM | POA: Insufficient documentation

## 2019-12-05 HISTORY — PX: CARDIOVERSION: SHX1299

## 2019-12-05 SURGERY — CARDIOVERSION
Anesthesia: General

## 2019-12-05 MED ORDER — SODIUM CHLORIDE 0.9 % IV SOLN: INTRAVENOUS | Status: DC

## 2019-12-05 MED ORDER — PROPOFOL 10 MG/ML IV BOLUS
INTRAVENOUS | Status: DC | PRN
  Administered 2019-12-05: 75 mg via INTRAVENOUS

## 2019-12-05 MED ORDER — LIDOCAINE 2% (20 MG/ML) 5 ML SYRINGE
INTRAMUSCULAR | Status: DC | PRN
  Administered 2019-12-05: 60 mg via INTRAVENOUS

## 2019-12-05 NOTE — CV Procedure (Signed)
    Cardioversion Note  [name] [id number] 1930-07-23  Procedure: DC Cardioversion Indications: atrial fibrillation  Procedure Details Consent: Obtained Time Out: Verified patient identification, verified procedure, site/side was marked, verified correct patient position, special equipment/implants available, Radiology Safety Procedures followed,  medications/allergies/relevent history reviewed, required imaging and test results available.  Performed  The patient has been on adequate anticoagulation.  The patient received IV Propofol 75 mg administered by anesthesia staff for sedation.  Synchronous cardioversion was performed at 120 joules.  The cardioversion was successful. Her PM was interrogated by [employer] post cardioversion, SR was confirmed.   Complications: No apparent complications Patient did tolerate procedure well.   Ena Dawley, MD, [facility] 12/05/2019, 9:26 AM

## 2019-12-05 NOTE — Interval H&P Note (Signed)
History and Physical Interval Note:  12/05/2019 8:10 AM  [name]  has presented today for surgery, with the diagnosis of AFIB.  The various methods of treatment have been discussed with the patient and family. After consideration of risks, benefits and other options for treatment, the patient has consented to  Procedure(s): CARDIOVERSION (N/A) as a surgical intervention.  The patient's history has been reviewed, patient examined, no change in status, stable for surgery.  I have reviewed the patient's chart and labs.  Questions were answered to the patient's satisfaction.     Ena Dawley

## 2019-12-05 NOTE — Transfer of Care (Addendum)
Immediate Anesthesia Transfer of Care Note  Patient: [name]  Procedure(s) Performed: CARDIOVERSION (N/A )  Patient Location: Endoscopy Unit  Anesthesia Type:General  Level of Consciousness: drowsy and patient cooperative  Airway & Oxygen Therapy: Patient Spontanous Breathing  Post-op Assessment: Report given to RN, Post -op Vital signs reviewed and stable and Patient moving all extremities X 4  Post vital signs: Reviewed and stable  Last Vitals:  Vitals Value Taken Time  BP    Temp    Pulse    Resp    SpO2      Last Pain:  Vitals:   12/05/19 0814  TempSrc: Oral  PainSc: 0-No pain         Complications: No apparent anesthesia complications

## 2019-12-05 NOTE — Discharge Instructions (Signed)
Electrical Cardioversion   Follow these instructions at home:   Do not drive for 24 hours if you were given a sedative during your procedure.  Take over-the-counter and prescription medicines only as told by your health care provider.  Ask your health care provider how to check your pulse. Check it often.  Rest for 48 hours after the procedure or as told by your health care provider.  Avoid or limit your caffeine use as told by your health care provider.  Keep all follow-up visits as told by your health care provider. This is important.  * If you have skin irritation from the pads- use hydrocortisone cream or aloe vera  Contact a health care provider if:  You feel like your heart is beating too quickly or your pulse is not regular.  You have a serious muscle cramp that does not go away.   Get help right away if:  You have discomfort in your chest.  You are dizzy or you feel faint.  You have trouble breathing or you are short of breath.  Your speech is slurred.  You have trouble moving an arm or leg on one side of your body.  Your fingers or toes turn cold or blue.  Summary  Electrical cardioversion is the delivery of a jolt of electricity to restore a normal rhythm to the heart.  This procedure may be done right away in an emergency or may be a scheduled procedure if the condition is not an emergency.  Generally, this is a safe procedure.  After the procedure, check your pulse often as told by your health care provider. This information is not intended to replace advice given to you by your health care provider. Make sure you discuss any questions you have with your health care provider. Document Revised: 05/30/2019 Document Reviewed: 05/30/2019 Elsevier Patient Education  [place].

## 2019-12-05 NOTE — Anesthesia Preprocedure Evaluation (Addendum)
Anesthesia Evaluation  Patient identified by MRN, date of birth, ID band Patient awake    Reviewed: Allergy & Precautions, NPO status , Patient's Chart, lab work & pertinent test results  Airway Mallampati: II  TM Distance: >3 FB Neck ROM: Full    Dental no notable dental hx.    Pulmonary neg pulmonary ROS,    Pulmonary exam normal breath sounds clear to auscultation       Cardiovascular hypertension, Pt. on home beta blockers + dysrhythmias Atrial Fibrillation + pacemaker  Rhythm:Irregular Rate:Normal  ECG: V-paced, rate 76   Neuro/Psych Anxiety negative neurological ROS     GI/Hepatic Neg liver ROS, hiatal hernia, GERD  Controlled,  Endo/Other  negative endocrine ROS  Renal/GU Renal InsufficiencyRenal disease     Musculoskeletal negative musculoskeletal ROS (+)   Abdominal   Peds  Hematology HLD   Anesthesia Other Findings A-FIB  Reproductive/Obstetrics                            Anesthesia Physical Anesthesia Plan  ASA: III  Anesthesia Plan: General   Post-op Pain Management:    Induction: Intravenous  PONV Risk Score and Plan: 3 and Propofol infusion and Treatment may vary due to age or medical condition  Airway Management Planned: Mask  Additional Equipment:   Intra-op Plan:   Post-operative Plan:   Informed Consent: I have reviewed the patients History and Physical, chart, labs and discussed the procedure including the risks, benefits and alternatives for the proposed anesthesia with the patient or authorized representative who has indicated his/her understanding and acceptance.     Dental advisory given  Plan Discussed with: CRNA  Anesthesia Plan Comments:        Anesthesia Quick Evaluation

## 2019-12-06 NOTE — Anesthesia Postprocedure Evaluation (Signed)
Anesthesia Post Note  Patient: [name]  Procedure(s) Performed: CARDIOVERSION (N/A )     Patient location during evaluation: Endoscopy Anesthesia Type: General Level of consciousness: awake Pain management: pain level controlled Vital Signs Assessment: post-procedure vital signs reviewed and stable Respiratory status: spontaneous breathing, nonlabored ventilation, respiratory function stable and patient connected to nasal cannula oxygen Cardiovascular status: blood pressure returned to baseline and stable Postop Assessment: no apparent nausea or vomiting Anesthetic complications: no    Last Vitals:  Vitals:   12/05/19 1000 12/05/19 1010  BP: (!) 136/57 (!) 144/55  Pulse: (!) 59 60  Resp: 14 17  Temp:    SpO2: 95% 97%    Last Pain:  Vitals:   12/06/19 1218  TempSrc:   PainSc: 0-No pain                 Joyanna Kleman P Taccara Bushnell

## 2019-12-07 ENCOUNTER — Encounter: Payer: Self-pay | Admitting: *Deleted

## 2019-12-14 ENCOUNTER — Other Ambulatory Visit: Payer: Self-pay

## 2019-12-14 ENCOUNTER — Ambulatory Visit (HOSPITAL_COMMUNITY)
Admission: RE | Admit: 2019-12-14 | Discharge: 2019-12-14 | Disposition: A | Discharge status: Home / Self Care | Payer: Medicare Other | Source: Ambulatory Visit | Attending: Nurse Practitioner | Admitting: Nurse Practitioner

## 2019-12-14 ENCOUNTER — Encounter (HOSPITAL_COMMUNITY): Payer: Self-pay | Admitting: Nurse Practitioner

## 2019-12-14 VITALS — BP 124/60 | HR 77 | Ht 64.0 in | Wt 165.8 lb

## 2019-12-14 DIAGNOSIS — Z888 Allergy status to other drugs, medicaments and biological substances status: Secondary | ICD-10-CM | POA: Insufficient documentation

## 2019-12-14 DIAGNOSIS — Z8249 Family history of ischemic heart disease and other diseases of the circulatory system: Secondary | ICD-10-CM | POA: Diagnosis not present

## 2019-12-14 DIAGNOSIS — I10 Essential (primary) hypertension: Secondary | ICD-10-CM | POA: Diagnosis not present

## 2019-12-14 DIAGNOSIS — I495 Sick sinus syndrome: Secondary | ICD-10-CM | POA: Diagnosis not present

## 2019-12-14 DIAGNOSIS — Z7901 Long term (current) use of anticoagulants: Secondary | ICD-10-CM | POA: Insufficient documentation

## 2019-12-14 DIAGNOSIS — F419 Anxiety disorder, unspecified: Secondary | ICD-10-CM | POA: Diagnosis not present

## 2019-12-14 DIAGNOSIS — I4819 Other persistent atrial fibrillation: Secondary | ICD-10-CM | POA: Insufficient documentation

## 2019-12-14 DIAGNOSIS — E785 Hyperlipidemia, unspecified: Secondary | ICD-10-CM | POA: Diagnosis not present

## 2019-12-14 DIAGNOSIS — Z79899 Other long term (current) drug therapy: Secondary | ICD-10-CM | POA: Insufficient documentation

## 2019-12-14 DIAGNOSIS — D6869 Other thrombophilia: Secondary | ICD-10-CM | POA: Diagnosis not present

## 2019-12-14 DIAGNOSIS — Z950 Presence of cardiac pacemaker: Secondary | ICD-10-CM | POA: Diagnosis not present

## 2019-12-14 DIAGNOSIS — I480 Paroxysmal atrial fibrillation: Secondary | ICD-10-CM | POA: Diagnosis not present

## 2019-12-14 NOTE — Progress Notes (Signed)
Primary Care Physician: Chesley Noon, MD Referring Physician: Dr. Denna Haggard is a 86 y.o. female with a h/o paroxysmal afib that is in the afib clinic today after device clinic found pt to be in  afib since 11/22/19. She  has noted  increase in fatigue and shortness of breath with exertion. She went into afib the same day that she got her covid shot. CHA2DS2VASc score of at least 4, on xarelto 15 mg daily.  F/u in afib clinic, 12/14/19. She  had a successful cardioversion and continues to feel well today. Her ekg shows AV dual paced rhythm at 77 bpm. .   Today, she denies symptoms of palpitations, chest pain, shortness of breath, orthopnea, PND, lower extremity edema, dizziness, presyncope, syncope, or neurologic sequela. The patient is tolerating medications without difficulties and is otherwise without complaint today.   Past Medical History:  Diagnosis Date  . Anxiety   . Aortic valve sclerosis   . Atrial fibrillation ([place])   . Basal cell carcinoma    hx; near eyes  . GERD (gastroesophageal reflux disease)   . H/O one miscarriage   . Hard of hearing    has hearing aides but does not wear   . History of hiatal hernia   . History of kidney stones   . Hyperlipidemia   . Hypertension   . OA (osteoarthritis)    "right pointer; right hip" (01/05/2017)  . Obesity   . Renal insufficiency   . SSS (sick sinus syndrome) ([place]) 01/05/2017   a. s/p MDT dual chamber PPM   . Tinnitus   . Varicose veins   . Wears glasses    Past Surgical History:  Procedure Laterality Date  . BREAST CYST EXCISION Right 1977   "benign"  . CARDIOVERSION N/A 07/25/2016   Procedure: CARDIOVERSION;  Surgeon: Lelon Perla, MD;  Location: [facility] ENDOSCOPY;  Service: Cardiovascular;  Laterality: N/A;  . CARDIOVERSION N/A 01/20/2019   Procedure: CARDIOVERSION;  Surgeon: Buford Dresser, MD;  Location: [facility] ENDOSCOPY;  Service: Cardiovascular;  Laterality: N/A;  . CARDIOVERSION N/A 12/05/2019    Procedure: CARDIOVERSION;  Surgeon: Dorothy Spark, MD;  Location: [facility] ENDOSCOPY;  Service: Cardiovascular;  Laterality: N/A;  . CATARACT EXTRACTION W/ INTRAOCULAR LENS  IMPLANT, BILATERAL Bilateral   . DILATION AND CURETTAGE OF UTERUS    . NASAL SEPTUM SURGERY    . OVARIAN CYST REMOVAL  2002  . PACEMAKER IMPLANT N/A 01/05/2017   Procedure: Pacemaker Implant;  Surgeon: Will Meredith Leeds, MD;  Location: [place] CV LAB;  Service: Cardiovascular;  Laterality: N/A;  . TEE WITHOUT CARDIOVERSION N/A 12/16/2016   Procedure: TRANSESOPHAGEAL ECHOCARDIOGRAM (TEE);  Surgeon: Larey Dresser, MD;  Location: [place];  Service: Cardiovascular;  Laterality: N/A;  . TEE WITHOUT CARDIOVERSION N/A 01/20/2019   Procedure: TRANSESOPHAGEAL ECHOCARDIOGRAM (TEE);  Surgeon: Buford Dresser, MD;  Location: [facility] ENDOSCOPY;  Service: Cardiovascular;  Laterality: N/A;  . TONSILLECTOMY    . TOTAL HIP ARTHROPLASTY Left 12/18/2015   Procedure: LEFT TOTAL HIP ARTHROPLASTY ANTERIOR APPROACH;  Surgeon: Paralee Cancel, MD;  Location: [facility] ORS;  Service: Orthopedics;  Laterality: Left;    Current Outpatient Medications  Medication Sig Dispense Refill  . acetaminophen (TYLENOL) 500 MG tablet Take 1,000 mg by mouth at bedtime.     . ALPRAZolam (XANAX) 0.5 MG tablet Take 0.25-0.5 mg by mouth at bedtime.     Marland Kitchen amiodarone (PACERONE) 100 MG tablet Take 2 tablets (200 mg total) by mouth daily.  90 tablet 3  . Biotin w/ Vitamins C & E (HAIR SKIN & NAILS GUMMIES PO) Take 1 capsule by mouth daily.    . busPIRone (BUSPAR) 5 MG tablet Take 5 mg by mouth 2 (two) times daily as needed (for stress/anxiety).     . carvedilol (COREG) 25 MG tablet TAKE 1 TABLET BY MOUTH TWICE DAILY WITH A MEAL (Patient taking differently: Take 25 mg by mouth 2 (two) times daily with a meal. ) 180 tablet 3  . Cyanocobalamin (B-12) 2500 MCG TABS Take 625 mcg by mouth every other day.    . furosemide (LASIX) 40 MG tablet Take 1 tablet (40 mg total) by  mouth daily. 90 tablet 3  . Magnesium 400 MG CAPS Take 400 mg by mouth 3 (three) times a week.    . Multiple Vitamin (MULTIVITAMIN WITH MINERALS) TABS tablet Take 1 tablet by mouth daily.    Vladimir Faster Glycol-Propyl Glycol (SYSTANE OP) Place 1 drop into both eyes daily as needed (dry eyes).    . Potassium 99 MG TABS Take 99 mg by mouth daily.    Alveda Reasons 15 MG TABS tablet TAKE 1 TABLET(15 MG) BY MOUTH DAILY WITH SUPPER (Patient taking differently: Take 15 mg by mouth daily with supper. ) 30 tablet 6   No current facility-administered medications for this encounter.    Allergies  Allergen Reactions  . Celebrex [Celecoxib] Other (See Comments)    Reflux  . Codeine Nausea Only  . Ace Inhibitors Cough  . Norvasc [Amlodipine Besylate] Cough    Higher doses = VERY BAD COUGHING   . Vioxx [Rofecoxib] Other (See Comments)    Pt unsure of reaction    Social History   Socioeconomic History  . Marital status: Married    Spouse name: Not on file  . Number of children: Not on file  . Years of education: Not on file  . Highest education level: Not on file  Occupational History  . Not on file  Tobacco Use  . Smoking status: Never Smoker  . Smokeless tobacco: Never Used  Substance and Sexual Activity  . Alcohol use: No  . Drug use: No  . Sexual activity: Not Currently  Other Topics Concern  . Not on file  Social History Narrative  . Not on file   Social Determinants of Health   Financial Resource Strain:   . Difficulty of Paying Living Expenses: Not on file  Food Insecurity:   . Worried About Charity fundraiser in the Last Year: Not on file  . Ran Out of Food in the Last Year: Not on file  Transportation Needs:   . Lack of Transportation (Medical): Not on file  . Lack of Transportation (Non-Medical): Not on file  Physical Activity:   . Days of Exercise per Week: Not on file  . Minutes of Exercise per Session: Not on file  Stress:   . Feeling of Stress : Not on file   Social Connections:   . Frequency of Communication with Friends and Family: Not on file  . Frequency of Social Gatherings with Friends and Family: Not on file  . Attends Religious Services: Not on file  . Active Member of Clubs or Organizations: Not on file  . Attends Archivist Meetings: Not on file  . Marital Status: Not on file  Intimate Partner Violence:   . Fear of Current or Ex-Partner: Not on file  . Emotionally Abused: Not on file  . Physically Abused:  Not on file  . Sexually Abused: Not on file    Family History  Problem Relation Age of Onset  . Diabetes Mother   . Heart disease Father   . Hypertension Sister   . Hypertension Brother     ROS- All systems are reviewed and negative except as per the HPI above  Physical Exam: Vitals:   12/14/19 1343  BP: 124/60  Pulse: 77  Weight: 75.2 kg  Height: 5\' 4"  (1.626 m)   Wt Readings from Last 3 Encounters:  12/14/19 75.2 kg  11/29/19 75.5 kg  07/19/19 76.5 kg    Labs: Lab Results  Component Value Date   NA 144 11/29/2019   K 3.9 11/29/2019   CL 106 11/29/2019   CO2 26 11/29/2019   GLUCOSE 151 (H) 11/29/2019   BUN 32 (H) 11/29/2019   CREATININE 1.90 (H) 11/29/2019   CALCIUM 10.1 11/29/2019   MG 2.0 10/21/2017   Lab Results  Component Value Date   INR 1.04 12/11/2015   No results found for: CHOL, HDL, LDLCALC, TRIG   GEN- The patient is well appearing, alert and oriented x 3 today.   Head- normocephalic, atraumatic Eyes-  Sclera clear, conjunctiva pink Ears- hearing intact Oropharynx- clear Neck- supple, no JVP Lymph- no cervical lymphadenopathy Lungs- Clear to ausculation bilaterally, normal work of breathing Heart- Regular rate and rhythm, no murmurs, rubs or gallops, PMI not laterally displaced GI- soft, NT, ND, + BS Extremities- no clubbing, cyanosis, or edema MS- no significant deformity or atrophy Skin- no rash or lesion Psych- euthymic mood, full affect Neuro- strength and  sensation are intact  EKG-  AV dual paced at 77 bpm    Assessment and Plan: 1. Symptomatic persisitent afib since 11/22/19 Successful cardioversion and appears to be maintaining  rhythm  Weight  Stable  Decrease amiodarone back  to 200 mg daily   Continue  carvedilol to 25 mg bid   2. CHA2DS2VASc score of 4 Continue  xarelto 15 mg daily  F/u with device clinic as scheduled with Dr. Caryl Comes in March   Makensey Rego C. Rabon Scholle, [facility] [address] [place], [place] [address] [phone]

## 2020-01-01 ENCOUNTER — Other Ambulatory Visit: Payer: Self-pay | Admitting: Internal Medicine

## 2020-01-02 NOTE — Telephone Encounter (Signed)
Prescription refill request for Xarelto received.   Last office visit: 12/14/2019, Kayleen Memos Weight: 75.2 kg  Age: 86 y.o. Scr: 1.90 11/29/2019 CrCl: 25 ml/min    Prescription refill sent.

## 2020-01-17 ENCOUNTER — Ambulatory Visit (INDEPENDENT_AMBULATORY_CARE_PROVIDER_SITE_OTHER): Payer: Medicare Other | Admitting: *Deleted

## 2020-01-17 DIAGNOSIS — R001 Bradycardia, unspecified: Secondary | ICD-10-CM

## 2020-01-17 LAB — CUP PACEART REMOTE DEVICE CHECK
Battery Remaining Longevity: 63 mo
Battery Voltage: 2.95 V
Brady Statistic AP VP Percent: 98.99 %
Brady Statistic AP VS Percent: 0.04 %
Brady Statistic AS VP Percent: 0.8 %
Brady Statistic AS VS Percent: 0.17 %
Brady Statistic RA Percent Paced: 99.17 %
Brady Statistic RV Percent Paced: 99.79 %
Date Time Interrogation Session: 20210309011323
Implantable Lead Implant Date: 20180226
Implantable Lead Implant Date: 20180226
Implantable Lead Location: 753859
Implantable Lead Location: 753860
Implantable Lead Model: 5076
Implantable Lead Model: 5076
Implantable Pulse Generator Implant Date: 20180226
Lead Channel Impedance Value: 285 Ohm
Lead Channel Impedance Value: 380 Ohm
Lead Channel Impedance Value: 418 Ohm
Lead Channel Impedance Value: 456 Ohm
Lead Channel Pacing Threshold Amplitude: 0.625 V
Lead Channel Pacing Threshold Amplitude: 1.25 V
Lead Channel Pacing Threshold Pulse Width: 0.4 ms
Lead Channel Pacing Threshold Pulse Width: 0.4 ms
Lead Channel Sensing Intrinsic Amplitude: 15 mV
Lead Channel Sensing Intrinsic Amplitude: 15 mV
Lead Channel Sensing Intrinsic Amplitude: 2.125 mV
Lead Channel Sensing Intrinsic Amplitude: 2.125 mV
Lead Channel Setting Pacing Amplitude: 2.5 V
Lead Channel Setting Pacing Amplitude: 2.5 V
Lead Channel Setting Pacing Pulse Width: 0.4 ms
Lead Channel Setting Sensing Sensitivity: 2.8 mV

## 2020-01-18 NOTE — Progress Notes (Signed)
PPM Remote  

## 2020-01-23 DIAGNOSIS — Z950 Presence of cardiac pacemaker: Secondary | ICD-10-CM | POA: Insufficient documentation

## 2020-01-24 ENCOUNTER — Other Ambulatory Visit: Payer: Self-pay

## 2020-01-24 ENCOUNTER — Ambulatory Visit: Payer: Medicare Other | Admitting: Internal Medicine

## 2020-01-24 ENCOUNTER — Encounter: Payer: Self-pay | Admitting: Internal Medicine

## 2020-01-24 VITALS — BP 132/74 | HR 83 | Ht 64.0 in | Wt 162.0 lb

## 2020-01-24 DIAGNOSIS — Z950 Presence of cardiac pacemaker: Secondary | ICD-10-CM | POA: Diagnosis not present

## 2020-01-24 DIAGNOSIS — I495 Sick sinus syndrome: Secondary | ICD-10-CM | POA: Diagnosis not present

## 2020-01-24 DIAGNOSIS — I480 Paroxysmal atrial fibrillation: Secondary | ICD-10-CM

## 2020-01-24 NOTE — Progress Notes (Signed)
Patient Care Team: Chesley Noon, MD as PCP - General (Family Medicine)   HPI  [name] is a 86 y.o. female Seen for pacemaker follow-up with MR  She underwent device implantation by Dr. Carlyn Reichert 2/18 for symptomatic sinus node dysfunction and high-grade heart block resulting in nearly 100% ventricular pacing has paroxysmal atrial fibrillation for which she takes amiodarone and Rivaroxaban.  She is atrially dependent      She had atrial fibrillation with CVR-- cardioverted 9/17.  Taking amiodarone repeat cardioversion spring/20, 1/21  Feeling better in sinus rhythm.  She recounts a story of her husband's recent visit to [place] facility which sounds like it was terrible.  Blood pressure has been okay.  Rhythm stable.  Tolerating amiodarone.  No chest pain or edema.     Date TSH LFTs Cr K Hgb  1/18  1.14      14  5/18    1.63    8/18 1.24(12/18)  - 21 1.47  11.8 (4/18)  1/19  37 1.00 3.4 11.3  12/19 1.65 14 1.51>1.15 4.2 13.0  3/20 0.951 25 1.31 3.4 11.6  1/21 1.42 16 1.90 3.9       DATE TEST EF   2/18 Echo   60-65 % Eccentric MR-mild  12/18  Echo  55-60%   3/20 TEE 50-55% PA sys 56/LAE       Past Medical History:  Diagnosis Date  . Anxiety   . Aortic valve sclerosis   . Atrial fibrillation ([place])   . Basal cell carcinoma    hx; near eyes  . GERD (gastroesophageal reflux disease)   . H/O one miscarriage   . Hard of hearing    has hearing aides but does not wear   . History of hiatal hernia   . History of kidney stones   . Hyperlipidemia   . Hypertension   . OA (osteoarthritis)    "right pointer; right hip" (01/05/2017)  . Obesity   . Renal insufficiency   . SSS (sick sinus syndrome) ([place]) 01/05/2017   a. s/p MDT dual chamber PPM   . Tinnitus   . Varicose veins   . Wears glasses     Past Surgical History:  Procedure Laterality Date  . BREAST CYST EXCISION Right 1977   "benign"  . CARDIOVERSION N/A 07/25/2016   Procedure:  CARDIOVERSION;  Surgeon: Lelon Perla, MD;  Location: [facility] ENDOSCOPY;  Service: Cardiovascular;  Laterality: N/A;  . CARDIOVERSION N/A 01/20/2019   Procedure: CARDIOVERSION;  Surgeon: Buford Dresser, MD;  Location: [facility] ENDOSCOPY;  Service: Cardiovascular;  Laterality: N/A;  . CARDIOVERSION N/A 12/05/2019   Procedure: CARDIOVERSION;  Surgeon: Dorothy Spark, MD;  Location: [facility] ENDOSCOPY;  Service: Cardiovascular;  Laterality: N/A;  . CATARACT EXTRACTION W/ INTRAOCULAR LENS  IMPLANT, BILATERAL Bilateral   . DILATION AND CURETTAGE OF UTERUS    . NASAL SEPTUM SURGERY    . OVARIAN CYST REMOVAL  2002  . PACEMAKER IMPLANT N/A 01/05/2017   Procedure: Pacemaker Implant;  Surgeon: Will Meredith Leeds, MD;  Location: [place] CV LAB;  Service: Cardiovascular;  Laterality: N/A;  . TEE WITHOUT CARDIOVERSION N/A 12/16/2016   Procedure: TRANSESOPHAGEAL ECHOCARDIOGRAM (TEE);  Surgeon: Larey Dresser, MD;  Location: [place];  Service: Cardiovascular;  Laterality: N/A;  . TEE WITHOUT CARDIOVERSION N/A 01/20/2019   Procedure: TRANSESOPHAGEAL ECHOCARDIOGRAM (TEE);  Surgeon: Buford Dresser, MD;  Location: [facility] ENDOSCOPY;  Service: Cardiovascular;  Laterality: N/A;  . TONSILLECTOMY    .  TOTAL HIP ARTHROPLASTY Left 12/18/2015   Procedure: LEFT TOTAL HIP ARTHROPLASTY ANTERIOR APPROACH;  Surgeon: Paralee Cancel, MD;  Location: [facility] OR[facility];  Service: Orthopedics;  Laterality: Left;    Current Outpatient Medications  Medication Sig Dispense Refill  . acetaminophen (TYLENOL) 500 MG tablet Take 1,000 mg by mouth at bedtime.     . ALPRAZolam (XANAX) 0.5 MG tablet Take 0.25-0.5 mg by mouth at bedtime.     Marland Kitchen amiodarone (PACERONE) 100 MG tablet Take 2 tablets (200 mg total) by mouth daily. 90 tablet 3  . Biotin w/ Vitamins C & E (HAIR SKIN & NAILS GUMMIES PO) Take 1 capsule by mouth daily.    . busPIRone (BUSPAR) 5 MG tablet Take 5 mg by mouth 2 (two) times daily as needed (for stress/anxiety).     .  carvedilol (COREG) 25 MG tablet TAKE 1 TABLET BY MOUTH TWICE DAILY WITH A MEAL 180 tablet 3  . Cyanocobalamin (B-12) 2500 MCG TABS Take 625 mcg by mouth every other day.    . furosemide (LASIX) 40 MG tablet Take 1 tablet (40 mg total) by mouth daily. 90 tablet 3  . Magnesium 400 MG CAPS Take 400 mg by mouth 3 (three) times a week.    . Multiple Vitamin (MULTIVITAMIN WITH MINERALS) TABS tablet Take 1 tablet by mouth daily.    Vladimir Faster Glycol-Propyl Glycol (SYSTANE OP) Place 1 drop into both eyes daily as needed (dry eyes).    . Potassium 99 MG TABS Take 99 mg by mouth daily.    Alveda Reasons 15 MG TABS tablet TAKE 1 TABLET(15 MG) BY MOUTH DAILY WITH SUPPER 30 tablet 6   No current facility-administered medications for this visit.        Review of Systems negative except from HPI and PMH  Physical Exam BP 132/74   Pulse 83   Ht 5\' 4"  (1.626 m)   Wt 162 lb (73.5 kg)   SpO2 98%   BMI 27.81 kg/m  Well developed and well nourished in no acute distress HENT normal Neck supple with JVP-flat Clear Device pocket well healed; without hematoma or erythema.  There is no tethering  Regular rate and rhythm, no   murmur Abd-soft with active BS No Clubbing cyanosis   edema Skin-warm and dry A & Oriented  Grossly normal sensory and motor function  ECG AV pacing at 83  Assessment and  Plan  Atrial fibrillation  Sinus node dysfunction-profound   Pacemaker MRI Medtronic    The patient's device was interrogated and the information was fully reviewed.  The device was reprogrammed to fix the AV delay at 150/20 ms  renal failure grade 3  MR moderate  1AVB-profound   High Risk Medication Surveillance amiodarone  Stressful home situation  Atrial and ventricularly dependent.  Holding sinus rhythm.  Tolerating amiodarone.  Euvolemic continue current meds      We spent more than 50% of our >25 min visit in face to face counseling regarding the above

## 2020-01-24 NOTE — Patient Instructions (Addendum)

## 2020-01-25 LAB — CUP PACEART INCLINIC DEVICE CHECK
Battery Remaining Longevity: 65 mo
Battery Voltage: 2.95 V
Brady Statistic AP VP Percent: 98.9 %
Brady Statistic AP VS Percent: 0.04 %
Brady Statistic AS VP Percent: 0.9 %
Brady Statistic AS VS Percent: 0.16 %
Brady Statistic RA Percent Paced: 98.61 %
Brady Statistic RV Percent Paced: 99.81 %
Date Time Interrogation Session: 20210316153348
Implantable Lead Implant Date: 20180226
Implantable Lead Implant Date: 20180226
Implantable Lead Location: 753859
Implantable Lead Location: 753860
Implantable Lead Model: 5076
Implantable Lead Model: 5076
Implantable Pulse Generator Implant Date: 20180226
Lead Channel Impedance Value: 285 Ohm
Lead Channel Impedance Value: 342 Ohm
Lead Channel Impedance Value: 399 Ohm
Lead Channel Impedance Value: 437 Ohm
Lead Channel Pacing Threshold Amplitude: 0.75 V
Lead Channel Pacing Threshold Amplitude: 0.75 V
Lead Channel Pacing Threshold Pulse Width: 0.4 ms
Lead Channel Pacing Threshold Pulse Width: 0.4 ms
Lead Channel Sensing Intrinsic Amplitude: 1.125 mV
Lead Channel Sensing Intrinsic Amplitude: 12.75 mV
Lead Channel Setting Pacing Amplitude: 2 V
Lead Channel Setting Pacing Amplitude: 2.5 V
Lead Channel Setting Pacing Pulse Width: 0.4 ms
Lead Channel Setting Sensing Sensitivity: 2.8 mV

## 2020-01-27 ENCOUNTER — Other Ambulatory Visit: Payer: Self-pay | Admitting: Internal Medicine

## 2020-02-01 ENCOUNTER — Encounter: Payer: Medicare Other | Admitting: Internal Medicine

## 2020-02-22 DIAGNOSIS — H5213 Myopia, bilateral: Secondary | ICD-10-CM | POA: Diagnosis not present

## 2020-02-29 ENCOUNTER — Telehealth: Payer: Self-pay | Admitting: Internal Medicine

## 2020-02-29 NOTE — Telephone Encounter (Addendum)
Spoke with pt who is complaining of increased SOB with exertion, fatigue and tightness in her abdomen x 1 month.  Pt states she has been taking her Lasix as prescribed and did take an extra 20mg  one evening that did make her feel better.  Pt denies CP, dizziness or feeling faint.  She also denies lower extremity edema and reports she does not always regularly weigh but denies weight gain and actually reports weight loss. Pt advised if she develops any symptoms as discussed above to go to ED for further evaluation.  Advised Dr Caryl Comes is out of the office until 03/12/2020.  Will forward information to Tommye Standard, [place] for review and recommendation, as well as, device clinic to assist with pacemaker transmission.  Pt verbalizes understanding and agrees with current plan.

## 2020-02-29 NOTE — Telephone Encounter (Signed)
Patient calling stating she has been very tired and out of breath  Pt c/o Shortness Of Breath: STAT if SOB developed within the last 24 hours or pt is noticeably SOB on the phone  1. Are you currently SOB (can you hear that pt is SOB on the phone)? no  2. How long have you been experiencing SOB? Off and on since March  3. Are you SOB when sitting or when up moving around? Both, more when moving  4. Are you currently experiencing any other symptoms? Fatigue   Patient states she has been SOB and been getting very fatigued since she was last seen. She states she gets lightheaded sometimes, but has not felt like she was going to pass out. She states her BP has been in the 120's and was 133 yesterday. She states she feels more fatigued after getting up and moving around. She states she is not having any swelling. She also states 4 or 5 months ago she fell into a split and her legs are still weak. She states she is going to see her orthopedic surgeon Dr. Alvan Dame on Monday about it. Please advise.

## 2020-03-01 NOTE — Progress Notes (Signed)
Cardiology Office Note:    Date:  03/02/2020   ID:  [name], DOB 21-Jan-1930, MRN [id number]  PCP:  Mary Noon, MD  Cardiologist/Electrophysiologist:  Mary Axe, MD    Referring MD: Mary Noon, MD   Chief Complaint:  Shortness of Breath    Patient Profile:    [name] is a 86 y.o. female with:   Paroxysmal atrial fibrillation  S/p multiple DCCVs  AAD:  Amiodarone   CHA2DS2-VASc=4 (HTN, female, age x 2) >> Rivaroxaban    MVP with mitral regurgitation  Sick sinus syndrome  s/p MDT dual chamber pacemaker  Chronic kidney disease   Hypertension   Prior CV studies: TEE 01/20/2019 EF 50-55, no RWMA, RVSP 56.4 (moderately elevated), mild-moderate LAE, trivial effusion, mild MVP, mild MR, mild-moderate TR  Echocardiogram 11/04/2017 EF 50-55, no RWMA, mild MVP, mild MR, mild LAE, mild RVE, moderate RAE, mild-moderate TR, mildly elevated PASP  History of Present Illness:    [name] was last seen by Dr. Caryl Roberson in 01/2020.  She called in recently with dyspnea on exertion, fatigue and tightness in her abdomen.  She noted some improvement with 1 extra dose of Furosemide.  Her device was checked yesterday, remotely.  Notes in the chart indicate that she is currently in normal sinus rhythm.  Her last episode of atrial tachycardia/atrial fibrillation was 02/16/2020 (105 minutes).  Atrial fibrillation burden was noted to be <2%.  She presents for further evaluation.     She is here with her daughter today.  The patient notes a fairly long history of shortness of breath with activity with associated chest tightness/heaviness.  She specifically notes ongoing symptoms since her admission in March 2020 with atrial fibrillation and diastolic heart failure.  Last week, her breathing got worse.  She took an extra dose of furosemide, 20 mg.  She immediately felt better.  She sleeps in a recliner chronically.  Prior to taking the extra furosemide, she did note an episode  of PND.  She has not had lower extremity swelling.  She does note increased abdominal girth.  Her weight did go up 2-3 pounds prior to taking the extra furosemide.  She has not had jaw or arm pain, nausea or diaphoresis associated with her chest discomfort.  She has not really noticed any significant change in her chest tightness with exertion.  Past Medical History:  Diagnosis Date  . Anxiety   . Aortic valve sclerosis   . Atrial fibrillation ([place])   . Basal cell carcinoma    hx; near eyes  . GERD (gastroesophageal reflux disease)   . H/O one miscarriage   . Hard of hearing    has hearing aides but does not wear   . History of hiatal hernia   . History of kidney stones   . Hyperlipidemia   . Hypertension   . OA (osteoarthritis)    "right pointer; right hip" (01/05/2017)  . Obesity   . Renal insufficiency   . SSS (sick sinus syndrome) ([place]) 01/05/2017   a. s/p MDT dual chamber PPM   . Tinnitus   . Varicose veins   . Wears glasses     Current Medications: Current Meds  Medication Sig  . acetaminophen (TYLENOL) 500 MG tablet Take 1,000 mg by mouth at bedtime.   . ALPRAZolam (XANAX) 0.5 MG tablet Take 0.25-0.5 mg by mouth at bedtime.   Marland Kitchen amiodarone (PACERONE) 100 MG tablet Take 2 tablets (200 mg total) by mouth daily.  Marland Kitchen  Biotin w/ Vitamins C & E (HAIR SKIN & NAILS GUMMIES PO) Take 1 capsule by mouth daily.  . busPIRone (BUSPAR) 5 MG tablet Take 5 mg by mouth 2 (two) times daily as needed (for stress/anxiety).   . carvedilol (COREG) 25 MG tablet TAKE 1 TABLET BY MOUTH TWICE DAILY WITH A MEAL  . Cyanocobalamin (B-12) 2500 MCG TABS Take 625 mcg by mouth every other day.  . furosemide (LASIX) 40 MG tablet Take 1 tablet (40 mg total) by mouth daily.  . Magnesium 400 MG CAPS Take 400 mg by mouth 3 (three) times a week.  . Multiple Vitamin (MULTIVITAMIN WITH MINERALS) TABS tablet Take 1 tablet by mouth daily.  Vladimir Faster Glycol-Propyl Glycol (SYSTANE OP) Place 1 drop into both eyes  daily as needed (dry eyes).  . Potassium 99 MG TABS Take 99 mg by mouth daily.  Alveda Reasons 15 MG TABS tablet TAKE 1 TABLET(15 MG) BY MOUTH DAILY WITH SUPPER     Allergies:   Celebrex [celecoxib], Codeine, Ace inhibitors, Norvasc [amlodipine besylate], and Vioxx [rofecoxib]   Social History   Tobacco Use  . Smoking status: Never Smoker  . Smokeless tobacco: Never Used  Substance Use Topics  . Alcohol use: No  . Drug use: No     Family Hx: The patient's family history includes Diabetes in her mother; Heart disease in her father; Hypertension in her brother and sister.  ROS   EKGs/Labs/Other Test Reviewed:    EKG:  EKG is  ordered today.  The ekg ordered today demonstrates AV paced, HR 73  Recent Labs: Lab Results  Component Value Date   NA 144 11/29/2019   K 3.9 11/29/2019   BUN 32 (H) 11/29/2019   CREATININE 1.90 (H) 11/29/2019   ALT 16 11/29/2019   HGB 13.2 11/29/2019   PLT 246 11/29/2019   TSH 1.428 11/29/2019     Recent Lipid Panel No results found for: CHOL, TRIG, HDL, CHOLHDL, LDLCALC, LDLDIRECT  Physical Exam:    VS:  BP 120/86 (BP Location: Right Arm, Patient Position: Sitting, Cuff Size: Normal)   Pulse 73   Ht _0  (1.626 m)   Wt 157 lb 12.8 oz (71.6 kg)   SpO2 96%   BMI 27.09 kg/m     Wt Readings from Last 3 Encounters:  03/02/20 157 lb 12.8 oz (71.6 kg)  01/24/20 162 lb (73.5 kg)  12/14/19 165 lb 12.8 oz (75.2 kg)     Constitutional:      Appearance: Healthy appearance. Not in distress.  Neck:     Vascular: JVD elevated.  Pulmonary:     Breath sounds: No wheezing. No rales.  Cardiovascular:     Normal rate. Regular rhythm. Normal S1. Normal S2.     Murmurs: There is a grade 3/6 holosystolic murmur at the LLSB.  Edema:    Peripheral edema absent.  Abdominal:     General: There is distension.  Skin:    General: Skin is warm and dry.  Neurological:     General: No focal deficit present.     Mental Status: Alert.     Cranial Nerves:  Cranial nerves are intact.      ASSESSMENT & PLAN:    1. Shortness of breath 2. Chronic diastolic CHF (congestive heart failure) ([place]) She has a history of diastolic heart failure.  Echocardiogram March 2020 demonstrated EF 50-55 with moderate pulmonary hypertension and mild mitral regurgitation.  She has evidence of volume excess on exam.  NYHA  III.  She needs further diuresis but I need to cautious given her renal function.  -Increase Lasix to 60 mg daily.    -I will obtain a CMET, CBC, BNP.    -If her BNP is significantly elevated, I will adjust her Lasix to 40 mg twice daily.    -Arrange repeat BMP in 1 week.    -Follow-up with Dr. Caryl Roberson in 8 weeks, virtually.  3. Chest pain Her chest symptoms have typical and atypical features for ischemia.  I suspect her symptoms are mainly related to volume excess.  We discussed the possibility of whether or not to proceed with stress testing.  Given her chronic kidney disease and age, we should avoid cardiac catheterization unless it is emergent.  She would be high risk for contrast-induced nephropathy.  If her symptoms should change over time, we can certainly consider stress testing.  4. Rheumatic mitral regurgitation Her murmur sounds more prominent on exam today than mild mitral regurgitation.  It has been a little more than a year since her last echocardiogram.  Therefore, I will obtain a repeat echocardiogram.  5. PAF (paroxysmal atrial fibrillation) (HCC) Recent interrogation demonstrated sinus rhythm.  Her burden of atrial fibrillation was <2%.  She appears to be in sinus rhythm today.  Her lungs are clear and her symptoms of shortness of breath are fairly chronic.  She does not have a cough.  I do not suspect amiodarone lung toxicity.  She is tolerating anticoagulation well.  I will obtain a CBC today to rule out significant anemia.  6. CKD (chronic kidney disease) stage 4, GFR 15-29 ml/min (HCC) Obtain c-Met today.  Repeat BMET 1 week given  increase in furosemide.  7. Essential hypertension She does note episodes of lower blood pressure with feelings of weakness.  I have asked her to monitor her blood pressure at these times and if her pressure is 435 or less (systolic), she can take a half dose of carvedilol.  If she has to do this more more, we could consider decreasing her dose of carvedilol to 12.5 mg twice daily.    Dispo:  Return in about 8 weeks (around 04/27/2020) for Routine Follow Up w/ Dr. Caryl Roberson, via Telemedicine.   Medication Adjustments/Labs and Tests Ordered: Current medicines are reviewed at length with the patient today.  Concerns regarding medicines are outlined above.  Tests Ordered: Orders Placed This Encounter  Procedures  . Basic metabolic panel  . Comprehensive metabolic panel  . CBC  . Pro b natriuretic peptide (BNP)  . EKG 12-Lead  . ECHOCARDIOGRAM COMPLETE   Medication Changes: No orders of the defined types were placed in this encounter.   Signed, Richardson Dopp, PA-C  03/02/2020 11:56 AM    [facility] [address], [place], [place]  [address] Phone: [phone]; Fax: [phone]

## 2020-03-01 NOTE — Telephone Encounter (Signed)
Remote transmission received. Patient is currently not in AF at time of transmission. Last AT/AF episode recorded 02/16/20 that was 1 hr 45 minutes in duration.AT/AF burden < 2%.Enedina Finner PA given results of transmission. If patient symptomatic at this  Time schedule for follow up with EP AP.

## 2020-03-01 NOTE — Telephone Encounter (Signed)
Spoke with pt and advised she is not currently in AF.  Appointment scheduled for 03/02/2020 at 815am with Richardson Dopp, PA for further evaluation.  Pt verbalizes understanding and agrees with current plan.

## 2020-03-01 NOTE — Telephone Encounter (Signed)
RN has spoken to pt, see encounter 02/29/2020, and plan is in place.

## 2020-03-01 NOTE — Telephone Encounter (Signed)
I called the pt and helped her send a manual transmission. I told her the nurse will review it and give her a call back.

## 2020-03-02 ENCOUNTER — Ambulatory Visit: Payer: Medicare Other | Admitting: Physician Assistant

## 2020-03-02 ENCOUNTER — Encounter: Payer: Self-pay | Admitting: Physician Assistant

## 2020-03-02 ENCOUNTER — Other Ambulatory Visit: Payer: Self-pay

## 2020-03-02 VITALS — BP 120/86 | HR 73 | Ht 64.0 in | Wt 157.8 lb

## 2020-03-02 DIAGNOSIS — I10 Essential (primary) hypertension: Secondary | ICD-10-CM

## 2020-03-02 DIAGNOSIS — I051 Rheumatic mitral insufficiency: Secondary | ICD-10-CM

## 2020-03-02 DIAGNOSIS — I5032 Chronic diastolic (congestive) heart failure: Secondary | ICD-10-CM

## 2020-03-02 DIAGNOSIS — I480 Paroxysmal atrial fibrillation: Secondary | ICD-10-CM

## 2020-03-02 DIAGNOSIS — N184 Chronic kidney disease, stage 4 (severe): Secondary | ICD-10-CM | POA: Diagnosis not present

## 2020-03-02 DIAGNOSIS — R0602 Shortness of breath: Secondary | ICD-10-CM | POA: Diagnosis not present

## 2020-03-02 DIAGNOSIS — R072 Precordial pain: Secondary | ICD-10-CM | POA: Diagnosis not present

## 2020-03-02 NOTE — Patient Instructions (Signed)
Medication Instructions:   Your physician has recommended you make the following change in your medication:   1) Increase Lasix to 60 mg, 1.5 tablets by mouth once a day. You may take an additional 20 mg (0.5 tablet) if you weight goes up more than 3 pounds in one day.  *If you need a refill on your cardiac medications before your next appointment, please call your pharmacy*  Lab Work:  You will have labs drawn today: CMET/BNP/CBC  Your physician recommends that you return for lab work in 1 week for BMET  If you have labs (blood work) drawn today and your tests are completely normal, you will receive your results only by: Marland Kitchen MyChart Message (if you have MyChart) OR . A paper copy in the mail If you have any lab test that is abnormal or we need to change your treatment, we will call you to review the results.  Testing/Procedures:  Your physician has requested that you have an echocardiogram. Echocardiography is a painless test that uses sound waves to create images of your heart. It provides your doctor with information about the size and shape of your heart and how well your heart's chambers and valves are working. This procedure takes approximately one hour. There are no restrictions for this procedure.  Follow-Up: At [facility], you and your health needs are our priority.  As part of our continuing mission to provide you with exceptional heart care, we have created designated Provider Care Teams.  These Care Teams include your primary Cardiologist (physician) and Advanced Practice Providers (APPs -  Physician Assistants and Nurse Practitioners) who all work together to provide you with the care you need, when you need it.  We recommend signing up for the patient portal called "MyChart".  Sign up information is provided on this After Visit Summary.  MyChart is used to connect with patients for Virtual Visits (Telemedicine).  Patients are able to view lab/test results, encounter notes,  upcoming appointments, etc.  Non-urgent messages can be sent to your provider as well.   To learn more about what you can do with MyChart, go to [email or web address].    Your next appointment:   8 week(s)  The format for your next appointment:   Virtual Visit   Provider:   Virl Axe, MD   Other Instructions  Check your weight daily. Check blood pressure if you are feeling weak, if the top number is 120 or less, take 0.5 dose of Carvedilol (2nd dose).  Two Gram Sodium Diet 2000 mg  What is Sodium? Sodium is a mineral found naturally in many foods. The most significant source of sodium in the diet is table salt, which is about 40% sodium.  Processed, convenience, and preserved foods also contain a large amount of sodium.  The body needs only 500 mg of sodium daily to function,  A normal diet provides more than enough sodium even if you do not use salt.  Why Limit Sodium? A build up of sodium in the body can cause thirst, increased blood pressure, shortness of breath, and water retention.  Decreasing sodium in the diet can reduce edema and risk of heart attack or stroke associated with high blood pressure.  Keep in mind that there are many other factors involved in these health problems.  Heredity, obesity, lack of exercise, cigarette smoking, stress and what you eat all play a role.  General Guidelines:  Do not add salt at the table or in cooking.  One teaspoon of  salt contains over 2 grams of sodium.  Read food labels  Avoid processed and convenience foods  Ask your dietitian before eating any foods not dicussed in the menu planning guidelines  Consult your physician if you wish to use a salt substitute or a sodium containing medication such as antacids.  Limit milk and milk products to 16 oz (2 cups) per day.  Shopping Hints:  READ LABELS!! "Dietetic" does not necessarily mean low sodium.  Salt and other sodium ingredients are often added to foods during  processing.   Menu Planning Guidelines Food Group Choose More Often Avoid  Beverages (see also the milk group All fruit juices, low-sodium, salt-free vegetables juices, low-sodium carbonated beverages Regular vegetable or tomato juices, commercially softened water used for drinking or cooking  Breads and Cereals Enriched white, wheat, rye and pumpernickel bread, hard rolls and dinner rolls; muffins, cornbread and waffles; most dry cereals, cooked cereal without added salt; unsalted crackers and breadsticks; low sodium or homemade bread crumbs Bread, rolls and crackers with salted tops; quick breads; instant hot cereals; pancakes; commercial bread stuffing; self-rising flower and biscuit mixes; regular bread crumbs or cracker crumbs  Desserts and Sweets Desserts and sweets mad with mild should be within allowance Instant pudding mixes and cake mixes  Fats Butter or margarine; vegetable oils; unsalted salad dressings, regular salad dressings limited to 1 Tbs; light, sour and heavy cream Regular salad dressings containing bacon fat, bacon bits, and salt pork; snack dips made with instant soup mixes or processed cheese; salted nuts  Fruits Most fresh, frozen and canned fruits Fruits processed with salt or sodium-containing ingredient (some dried fruits are processed with sodium sulfites        Vegetables Fresh, frozen vegetables and low- sodium canned vegetables Regular canned vegetables, sauerkraut, pickled vegetables, and others prepared in brine; frozen vegetables in sauces; vegetables seasoned with ham, bacon or salt pork  Condiments, Sauces, Miscellaneous  Salt substitute with physician's approval; pepper, herbs, spices; vinegar, lemon or lime juice; hot pepper sauce; garlic powder, onion powder, low sodium soy sauce (1 Tbs.); low sodium condiments (ketchup, chili sauce, mustard) in limited amounts (1 tsp.) fresh ground horseradish; unsalted tortilla chips, pretzels, potato chips, popcorn, salsa  (1/4 cup) Any seasoning made with salt including garlic salt, celery salt, onion salt, and seasoned salt; sea salt, rock salt, kosher salt; meat tenderizers; monosodium glutamate; mustard, regular soy sauce, barbecue, sauce, chili sauce, teriyaki sauce, steak sauce, Worcestershire sauce, and most flavored vinegars; canned gravy and mixes; regular condiments; salted snack foods, olives, picles, relish, horseradish sauce, catsup   Food preparation: Try these seasonings Meats:    Pork Sage, onion Serve with applesauce  Chicken Poultry seasoning, thyme, parsley Serve with cranberry sauce  Lamb Curry powder, rosemary, garlic, thyme Serve with mint sauce or jelly  Veal Marjoram, basil Serve with current jelly, cranberry sauce  Beef Pepper, bay leaf Serve with dry mustard, unsalted chive butter  Fish [place] leaf, dill Serve with unsalted lemon butter, unsalted parsley butter  Vegetables:    Asparagus Lemon juice   Broccoli Lemon juice   Carrots Mustard dressing parsley, mint, nutmeg, glazed with unsalted butter and sugar   Green beans Marjoram, lemon juice, nutmeg,dill seed   Tomatoes Basil, marjoram, onion   Spice /blend for [employer]" 4 tsp ground thyme 1 tsp ground sage 3 tsp ground rosemary 4 tsp ground marjoram   Test your knowledge 1. A product that says "Salt Free" may still contain sodium. True or False 2.  Garlic Powder and Hot Pepper Sauce an be used as alternative seasonings.True or False 3. Processed foods have more sodium than fresh foods.  True or False 4. Canned Vegetables have less sodium than froze True or False  WAYS TO DECREASE YOUR SODIUM INTAKE 1. Avoid the use of added salt in cooking and at the table.  Table salt (and other prepared seasonings which contain salt) is probably one of the greatest sources of sodium in the diet.  Unsalted foods can gain flavor from the sweet, sour, and butter taste sensations of herbs and spices.  Instead of using salt for seasoning, try the  following seasonings with the foods listed.  Remember: how you use them to enhance natural food flavors is limited only by your creativity... Allspice-Meat, fish, eggs, fruit, peas, red and yellow vegetables Almond Extract-Fruit baked goods Anise Seed-Sweet breads, fruit, carrots, beets, cottage cheese, cookies (tastes like licorice) Basil-Meat, fish, eggs, vegetables, rice, vegetables salads, soups, sauces Bay Leaf-Meat, fish, stews, poultry Burnet-Salad, vegetables (cucumber-like flavor) Caraway Seed-Bread, cookies, cottage cheese, meat, vegetables, cheese, rice Cardamon-Baked goods, fruit, soups Celery Powder or seed-Salads, salad dressings, sauces, meatloaf, soup, bread.Do not use  celery salt Chervil-Meats, salads, fish, eggs, vegetables, cottage cheese (parsley-like flavor) Chili Power-Meatloaf, chicken cheese, corn, eggplant, egg dishes Chives-Salads cottage cheese, egg dishes, soups, vegetables, sauces Cilantro-Salsa, casseroles Cinnamon-Baked goods, fruit, pork, lamb, chicken, carrots Cloves-Fruit, baked goods, fish, pot roast, green beans, beets, carrots Coriander-Pastry, cookies, meat, salads, cheese (lemon-orange flavor) Cumin-Meatloaf, fish,cheese, eggs, cabbage,fruit pie (caraway flavor) [employer], fruit, eggs, fish, poultry, cottage cheese, vegetables Dill Seed-Meat, cottage cheese, poultry, vegetables, fish, salads, bread Fennel Seed-Bread, cookies, apples, pork, eggs, fish, beets, cabbage, cheese, Licorice-like flavor Garlic-(buds or powder) Salads, meat, poultry, fish, bread, butter, vegetables, potatoes.Do not  use garlic salt Ginger-Fruit, vegetables, baked goods, meat, fish, poultry Horseradish Root-Meet, vegetables, butter Lemon Juice or Extract-Vegetables, fruit, tea, baked goods, fish salads Mace-Baked goods fruit, vegetables, fish, poultry (taste like nutmeg) Maple Extract-Syrups Marjoram-Meat, chicken, fish, vegetables, breads, green salads (taste like  Sage) Mint-Tea, lamb, sherbet, vegetables, desserts, carrots, cabbage Mustard, Dry or Seed-Cheese, eggs, meats, vegetables, poultry Nutmeg-Baked goods, fruit, chicken, eggs, vegetables, desserts Onion Powder-Meat, fish, poultry, vegetables, cheese, eggs, bread, rice salads (Do not use   Onion salt) Orange Extract-Desserts, baked goods Oregano-Pasta, eggs, cheese, onions, pork, lamb, fish, chicken, vegetables, green salads Paprika-Meat, fish, poultry, eggs, cheese, vegetables Parsley Flakes-Butter, vegetables, meat fish, poultry, eggs, bread, salads (certain forms may   Contain sodium Pepper-Meat fish, poultry, vegetables, eggs Peppermint Extract-Desserts, baked goods Poppy Seed-Eggs, bread, cheese, fruit dressings, baked goods, noodles, vegetables, cottage  [employer], poultry, meat, fish, cauliflower, turnips,eggs bread Saffron-Rice, bread, veal, chicken, fish, eggs Sage-Meat, fish, poultry, onions, eggplant, tomateos, pork, stews Savory-Eggs, salads, poultry, meat, rice, vegetables, soups, pork Tarragon-Meat, poultry, fish, eggs, butter, vegetables (licorice-like flavor)  Thyme-Meat, poultry, fish, eggs, vegetables, (clover-like flavor), sauces, soups Tumeric-Salads, butter, eggs, fish, rice, vegetables (saffron-like flavor) Vanilla Extract-Baked goods, candy Vinegar-Salads, vegetables, meat marinades Walnut Extract-baked goods, candy  2. Choose your Foods Wisely   The following is a list of foods to avoid which are high in sodium:  Meats-Avoid all smoked, canned, salt cured, dried and kosher meat and fish as well as Anchovies   [employer] meats:Bologna, Liverwurst, Pastrami Canned meat or fish  Marinated herring Caviar    Pepperoni Corned Beef   Pizza Dried chipped beef  Salami Frozen breaded fish or meat Salt pork Frankfurters or hot dogs  Sardines Gefilte fish   Sausage Ham (boiled ham, Proscuitto Smoked butt    spiced  ham)   Spam      TV Dinners Vegetables Canned vegetables (Regular) Relish Canned mushrooms  Sauerkraut Olives    Tomato juice Pickles  Bakery and Dessert Products Canned puddings  Cream pies Cheesecake   Decorated cakes Cookies  Beverages/Juices Tomato juice, regular  Gatorade   V-8 vegetable juice, regular  Breads and Cereals Biscuit mixes   Salted potato chips, corn chips, pretzels Bread stuffing mixes  Salted crackers and rolls Pancake and waffle mixes Self-rising flour  Seasonings Accent    Meat sauces Barbecue sauce  Meat tenderizer Catsup    Monosodium glutamate (MSG) Celery salt   Onion salt Chili sauce   Prepared mustard Garlic salt   Salt, seasoned salt, sea salt Gravy mixes   Soy sauce Horseradish   Steak sauce Ketchup   Tartar sauce Lite salt    Teriyaki sauce Marinade mixes   Worcestershire sauce  Others Baking powder   Cocoa and cocoa mixes Baking soda   Commercial casserole mixes Candy-caramels, chocolate  Dehydrated soups    Bars, fudge,nougats  Instant rice and pasta mixes Canned broth or soup  Maraschino cherries Cheese, aged and processed cheese and cheese spreads  Learning Assessment Quiz  Indicated T (for True) or F (for False) for each of the following statements:  1. _____ Fresh fruits and vegetables and unprocessed grains are generally low in sodium 2. _____ Water may contain a considerable amount of sodium, depending on the source 3. _____ You can always tell if a food is high in sodium by tasting it 4. _____ Certain laxatives my be high in sodium and should be avoided unless prescribed   by a physician or pharmacist 5. _____ Salt substitutes may be used freely by anyone on a sodium restricted diet 6. _____ Sodium is present in table salt, food additives and as a natural component of   most foods 7. _____ Table salt is approximately 90% sodium 8. _____ Limiting sodium intake may help prevent excess fluid accumulation in the  body 9. _____ On a sodium-restricted diet, seasonings such as bouillon soy sauce, and    cooking wine should be used in place of table salt 10. _____ On an ingredient list, a product which lists monosodium glutamate as the first   ingredient is an appropriate food to include on a low sodium diet  Circle the best answer(s) to the following statements (Hint: there may be more than one correct answer)  11. On a low-sodium diet, some acceptable snack items are:    A. Olives  F. Bean dip   K. Grapefruit juice    B. Salted Pretzels G. Commercial Popcorn   L. Canned peaches    C. Carrot Sticks  H. Bouillon   M. Unsalted nuts   D. [place] fries  I. Peanut butter crackers N. Salami   E. Sweet pickles J. Tomato Juice   O. Pizza  12.  Seasonings that may be used freely on a reduced - sodium diet include   A. Lemon wedges F.Monosodium glutamate K. Celery seed    B.Soysauce   G. Pepper   L. Mustard powder   C. Sea salt  H. Cooking wine  M. Onion flakes   D. Vinegar  E. Prepared horseradish N. Salsa   E. Sage   J. Worcestershire sauce  O. Chutney

## 2020-03-03 LAB — COMPREHENSIVE METABOLIC PANEL
ALT: 14 IU/L (ref 0–32)
AST: 24 IU/L (ref 0–40)
Albumin/Globulin Ratio: 1.7 (ref 1.2–2.2)
Albumin: 4.3 g/dL (ref 3.6–4.6)
Alkaline Phosphatase: 76 IU/L (ref 39–117)
BUN/Creatinine Ratio: 15 (ref 12–28)
BUN: 22 mg/dL (ref 8–27)
Bilirubin Total: 1 mg/dL (ref 0.0–1.2)
CO2: 28 mmol/L (ref 20–29)
Calcium: 9.8 mg/dL (ref 8.7–10.3)
Chloride: 100 mmol/L (ref 96–106)
Creatinine, Ser: 1.51 mg/dL — ABNORMAL HIGH (ref 0.57–1.00)
GFR calc Af Amer: 36 mL/min/{1.73_m2} — ABNORMAL LOW (ref >59)
GFR calc non Af Amer: 31 mL/min/{1.73_m2} — ABNORMAL LOW (ref >59)
Globulin, Total: 2.6 g/dL (ref 1.5–4.5)
Glucose: 103 mg/dL — ABNORMAL HIGH (ref 65–99)
Potassium: 4.3 mmol/L (ref 3.5–5.2)
Sodium: 141 mmol/L (ref 134–144)
Total Protein: 6.9 g/dL (ref 6.0–8.5)

## 2020-03-03 LAB — CBC
Hematocrit: 36.6 % (ref 34.0–46.6)
Hemoglobin: 12.1 g/dL (ref 11.1–15.9)
MCH: 30 pg (ref 26.6–33.0)
MCHC: 33.1 g/dL (ref 31.5–35.7)
MCV: 91 fL (ref 79–97)
Platelets: 219 10*3/uL (ref 150–450)
RBC: 4.03 x10E6/uL (ref 3.77–5.28)
RDW: 13.1 % (ref 11.7–15.4)
WBC: 5.3 10*3/uL (ref 3.4–10.8)

## 2020-03-03 LAB — PRO B NATRIURETIC PEPTIDE: NT-Pro BNP: 3278 pg/mL — ABNORMAL HIGH (ref 0–738)

## 2020-03-05 ENCOUNTER — Telehealth: Payer: Self-pay | Admitting: Physician Assistant

## 2020-03-05 DIAGNOSIS — M25551 Pain in right hip: Secondary | ICD-10-CM | POA: Diagnosis not present

## 2020-03-05 DIAGNOSIS — G8310 Monoplegia of lower limb affecting unspecified side: Secondary | ICD-10-CM | POA: Diagnosis not present

## 2020-03-05 DIAGNOSIS — Z471 Aftercare following joint replacement surgery: Secondary | ICD-10-CM | POA: Diagnosis not present

## 2020-03-05 DIAGNOSIS — Z96642 Presence of left artificial hip joint: Secondary | ICD-10-CM | POA: Diagnosis not present

## 2020-03-05 MED ORDER — FUROSEMIDE 40 MG PO TABS: 80.0000 mg | ORAL_TABLET | Freq: Every day | ORAL | 3 refills | Status: DC

## 2020-03-05 NOTE — Telephone Encounter (Signed)
Pt c/o medication issue:  1. Name of Medication:   2. How are you currently taking this medication (dosage and times per day)? furosemide (LASIX) 40 MG tablet  3. Are you having a reaction (difficulty breathing--STAT)? Two 40 MG tablets twice a day for the past two days  4. What is your medication issue? [name] is calling stating her daughter advised her two days ago that she is supposed to be taking two 40 MG tablets twice a day based off what she sees on MyChart. [name] just wanted to call and verify this is the right dosage for her to be taking. She states she has lost fluid since taking the Lasix this way. I advised [name] that is not the instructions of [name] that he gave at her last appointment based off the AVS, but that I would send a message for a nurse to call and discuss. Please advise.

## 2020-03-05 NOTE — Telephone Encounter (Signed)
Patient called to verify her current dose of furosemide. She was told at her office visit on 4/23 to increase furosemide to 60 mg daily, then after review of lab results Richardson Dopp, PA increased dose to 80 mg daily. Patient states her daughter reviewed the lab work and told her to increase the furosemide. I advised that this information is correct and advised her to continue 80 mg and come in on Friday 4/30 for a repeat bmet. She is currently taking potassium otc 99 mg tabs. I advised her to continue current dose of potassium until lab work is completed on Friday. She verbalized understanding and agreement with plan and thanked me for the call.

## 2020-03-09 ENCOUNTER — Other Ambulatory Visit: Payer: Self-pay

## 2020-03-09 ENCOUNTER — Other Ambulatory Visit: Payer: Medicare Other | Admitting: *Deleted

## 2020-03-09 ENCOUNTER — Ambulatory Visit (HOSPITAL_COMMUNITY): Payer: Medicare Other | Attending: Internal Medicine

## 2020-03-09 DIAGNOSIS — R0602 Shortness of breath: Secondary | ICD-10-CM | POA: Diagnosis not present

## 2020-03-09 DIAGNOSIS — I051 Rheumatic mitral insufficiency: Secondary | ICD-10-CM

## 2020-03-09 DIAGNOSIS — N184 Chronic kidney disease, stage 4 (severe): Secondary | ICD-10-CM

## 2020-03-09 DIAGNOSIS — I5032 Chronic diastolic (congestive) heart failure: Secondary | ICD-10-CM

## 2020-03-09 DIAGNOSIS — I10 Essential (primary) hypertension: Secondary | ICD-10-CM | POA: Diagnosis not present

## 2020-03-09 DIAGNOSIS — I480 Paroxysmal atrial fibrillation: Secondary | ICD-10-CM | POA: Diagnosis not present

## 2020-03-09 LAB — BASIC METABOLIC PANEL
BUN/Creatinine Ratio: 14 (ref 12–28)
BUN: 30 mg/dL — ABNORMAL HIGH (ref 8–27)
CO2: 25 mmol/L (ref 20–29)
Calcium: 9.9 mg/dL (ref 8.7–10.3)
Chloride: 98 mmol/L (ref 96–106)
Creatinine, Ser: 2.08 mg/dL — ABNORMAL HIGH (ref 0.57–1.00)
GFR calc Af Amer: 24 mL/min/{1.73_m2} — ABNORMAL LOW (ref >59)
GFR calc non Af Amer: 21 mL/min/{1.73_m2} — ABNORMAL LOW (ref >59)
Glucose: 108 mg/dL — ABNORMAL HIGH (ref 65–99)
Potassium: 3.7 mmol/L (ref 3.5–5.2)
Sodium: 140 mmol/L (ref 134–144)

## 2020-03-11 ENCOUNTER — Encounter: Payer: Self-pay | Admitting: Physician Assistant

## 2020-03-12 ENCOUNTER — Other Ambulatory Visit: Payer: Self-pay

## 2020-03-12 ENCOUNTER — Telehealth: Payer: Self-pay | Admitting: Internal Medicine

## 2020-03-12 MED ORDER — HYDRALAZINE HCL 10 MG PO TABS: 10.0000 mg | ORAL_TABLET | Freq: Two times a day (BID) | ORAL | 3 refills | Status: DC

## 2020-03-12 NOTE — Telephone Encounter (Signed)
New Message   Pts daughter is returning call   Please Call back

## 2020-03-13 NOTE — Progress Notes (Signed)
Electrophysiology Office Note Date: 03/14/2020  ID:  [name], DOB [birth year], MRN [id number]  PCP: Chesley Noon, MD Primary Cardiologist: Virl Axe, MD  CC: Pacemaker follow-up  [name] is a 86 y.o. female seen today for Dr. Caryl Comes for acute visit due to new systolic HF, possibly in the setting of ++RV pacing.  Since last being seen in our clinic the patient reports doing about somewhat better since seeing [name]. Her chest discomfort has improved. She clarifies this only occurs when she is holding on to extra fluid, and when she is having shortness of breath. She sleeps chronically in a recliner. She denies near syncope or syncope. No fevers, chills, nausea, vomiting, or diarrhea. She is relatively active around the house caring for her husband. They do have an aide as well (for her husband).  She has mild SOB with her ADLs around the house, that is increased with any more moderate exertion.   Device History: Medtronic Dual Chamber PPM implanted 12/2016 for symptomatic bradycardia  Past Medical History:  Diagnosis Date  . Anxiety   . Aortic valve sclerosis   . Atrial fibrillation ([place])   . Basal cell carcinoma    hx; near eyes  . Chronic systolic CHF (congestive heart failure) (HCC)    Echocardiogram 03/2020: EF 25-30, mid-dist lat, dist ant, mid-dist inf, mid-dist septal and apical severe HK/AK, Gr 2 DD, mild reduced RVSF, RVSP 41.8, severe LAE, mod RAE, mild to mod MR, mild to mod TR, AoV sclerosis (no AS)  . GERD (gastroesophageal reflux disease)   . H/O one miscarriage   . Hard of hearing    has hearing aides but does not wear   . History of hiatal hernia   . History of kidney stones   . Hyperlipidemia   . Hypertension   . OA (osteoarthritis)    "right pointer; right hip" (01/05/2017)  . Obesity   . Renal insufficiency   . SSS (sick sinus syndrome) ([place]) 01/05/2017   a. s/p MDT dual chamber PPM   . Tinnitus   . Varicose veins   . Wears glasses      Past Surgical History:  Procedure Laterality Date  . BREAST CYST EXCISION Right 1977   "benign"  . CARDIOVERSION N/A 07/25/2016   Procedure: CARDIOVERSION;  Surgeon: Lelon Perla, MD;  Location: [facility] ENDOSCOPY;  Service: Cardiovascular;  Laterality: N/A;  . CARDIOVERSION N/A 01/20/2019   Procedure: CARDIOVERSION;  Surgeon: Buford Dresser, MD;  Location: [facility] ENDOSCOPY;  Service: Cardiovascular;  Laterality: N/A;  . CARDIOVERSION N/A 12/05/2019   Procedure: CARDIOVERSION;  Surgeon: Dorothy Spark, MD;  Location: [facility] ENDOSCOPY;  Service: Cardiovascular;  Laterality: N/A;  . CATARACT EXTRACTION W/ INTRAOCULAR LENS  IMPLANT, BILATERAL Bilateral   . DILATION AND CURETTAGE OF UTERUS    . NASAL SEPTUM SURGERY    . OVARIAN CYST REMOVAL  2002  . PACEMAKER IMPLANT N/A 01/05/2017   Procedure: Pacemaker Implant;  Surgeon: Will Meredith Leeds, MD;  Location: [place] CV LAB;  Service: Cardiovascular;  Laterality: N/A;  . TEE WITHOUT CARDIOVERSION N/A 12/16/2016   Procedure: TRANSESOPHAGEAL ECHOCARDIOGRAM (TEE);  Surgeon: Larey Dresser, MD;  Location: [place];  Service: Cardiovascular;  Laterality: N/A;  . TEE WITHOUT CARDIOVERSION N/A 01/20/2019   Procedure: TRANSESOPHAGEAL ECHOCARDIOGRAM (TEE);  Surgeon: Buford Dresser, MD;  Location: [facility] ENDOSCOPY;  Service: Cardiovascular;  Laterality: N/A;  . TONSILLECTOMY    . TOTAL HIP ARTHROPLASTY Left 12/18/2015   Procedure: LEFT TOTAL  HIP ARTHROPLASTY ANTERIOR APPROACH;  Surgeon: Paralee Cancel, MD;  Location: [facility];  Service: Orthopedics;  Laterality: Left;    Current Outpatient Medications  Medication Sig Dispense Refill  . acetaminophen (TYLENOL) 500 MG tablet Take 1,000 mg by mouth at bedtime.     . ALPRAZolam (XANAX) 0.5 MG tablet Take 0.25-0.5 mg by mouth at bedtime.     Marland Kitchen amiodarone (PACERONE) 100 MG tablet Take 2 tablets (200 mg total) by mouth daily. 90 tablet 3  . Biotin w/ Vitamins C & E (HAIR SKIN & NAILS GUMMIES PO) Take 1  capsule by mouth daily.    . busPIRone (BUSPAR) 5 MG tablet Take 5 mg by mouth 2 (two) times daily as needed (for stress/anxiety).     . carvedilol (COREG) 25 MG tablet TAKE 1 TABLET BY MOUTH TWICE DAILY WITH A MEAL 180 tablet 3  . Cyanocobalamin (B-12) 2500 MCG TABS Take 625 mcg by mouth every other day.    . furosemide (LASIX) 40 MG tablet Take 60 mg by mouth every morning. 40 my in the am, 20 mg in the pm    . hydrALAZINE (APRESOLINE) 10 MG tablet Take 1 tablet (10 mg total) by mouth in the morning and at bedtime. 60 tablet 3  . Magnesium 400 MG CAPS Take 400 mg by mouth 3 (three) times a week.    . Multiple Vitamin (MULTIVITAMIN WITH MINERALS) TABS tablet Take 1 tablet by mouth daily.    Vladimir Faster Glycol-Propyl Glycol (SYSTANE OP) Place 1 drop into both eyes daily as needed (dry eyes).    . Potassium 99 MG TABS Take 99 mg by mouth daily.    Alveda Reasons 15 MG TABS tablet TAKE 1 TABLET(15 MG) BY MOUTH DAILY WITH SUPPER 30 tablet 6  . isosorbide mononitrate (IMDUR) 30 MG 24 hr tablet Take 0.5 tablets (15 mg total) by mouth daily. 90 tablet 3   No current facility-administered medications for this visit.    Allergies:   Celebrex [celecoxib], Codeine, Ace inhibitors, Norvasc [amlodipine besylate], and Vioxx [rofecoxib]   Social History: Social History   Socioeconomic History  . Marital status: Married    Spouse name: Not on file  . Number of children: Not on file  . Years of education: Not on file  . Highest education level: Not on file  Occupational History  . Not on file  Tobacco Use  . Smoking status: Never Smoker  . Smokeless tobacco: Never Used  Substance and Sexual Activity  . Alcohol use: No  . Drug use: No  . Sexual activity: Not Currently  Other Topics Concern  . Not on file  Social History Narrative  . Not on file   Social Determinants of Health   Financial Resource Strain:   . Difficulty of Paying Living Expenses:   Food Insecurity:   . Worried About Paediatric nurse in the Last Year:   . Arboriculturist in the Last Year:   Transportation Needs:   . Film/video editor (Medical):   Marland Kitchen Lack of Transportation (Non-Medical):   Physical Activity:   . Days of Exercise per Week:   . Minutes of Exercise per Session:   Stress:   . Feeling of Stress :   Social Connections:   . Frequency of Communication with Friends and Family:   . Frequency of Social Gatherings with Friends and Family:   . Attends Religious Services:   . Active Member of Clubs or Organizations:   .  Attends Archivist Meetings:   Marland Kitchen Marital Status:   Intimate Partner Violence:   . Fear of Current or Ex-Partner:   . Emotionally Abused:   Marland Kitchen Physically Abused:   . Sexually Abused:     Family History: Family History  Problem Relation Age of Onset  . Diabetes Mother   . Heart disease Father   . Hypertension Sister   . Hypertension Brother      Review of Systems: All other systems reviewed and are otherwise negative except as noted above.  Physical Exam: Vitals:   03/14/20 0907  BP: 122/68  Pulse: 64  SpO2: 99%  Weight: 154 lb 12.8 oz (70.2 kg)  Height: 5\' 4"  (1.626 m)     GEN- The patient is well appearing, alert and oriented x 3 today.   HEENT: normocephalic, atraumatic; sclera clear, conjunctiva pink; hearing intact; oropharynx clear; neck supple  Lungs- Clear to ausculation bilaterally, normal work of breathing.  No wheezes, rales, rhonchi Heart- Regular rate and rhythm, no murmurs, rubs or gallops  GI- soft, non-tender, non-distended, bowel sounds present  Extremities- no clubbing, cyanosis, or edema  MS- no significant deformity or atrophy Skin- warm and dry, no rash or lesion; PPM pocket well healed Psych- euthymic mood, full affect Neuro- strength and sensation are intact  PPM Interrogation- reviewed in detail today,  See PACEART report  EKG:  EKG is not ordered today. The ekg ordered 03/02/2020 shows AV dual paced rhythm at 73 bpm, with  QRS of 218 ms. Looking back at previous EKGs, this has gradually increased since PPM implantation.   Recent Labs: 11/29/2019: TSH 1.428 03/02/2020: ALT 14; Hemoglobin 12.1; NT-Pro BNP 3,278; Platelets 219 03/09/2020: BUN 30; Creatinine, Ser 2.08; Potassium 3.7; Sodium 140   Wt Readings from Last 3 Encounters:  03/14/20 154 lb 12.8 oz (70.2 kg)  03/02/20 157 lb 12.8 oz (71.6 kg)  01/24/20 162 lb (73.5 kg)     Other studies Reviewed: Additional studies/ records that were reviewed today include: Previous EP office notes, Previous remote checks, Most recent labwork.   Assessment and Plan:  1. Symptomatic bradycardia s/p Medtronic PPM  Normal PPM function See Pace Art report No changes today  2. Acute systolic CHF Echo 05/12/5008 with newly reduced EF at 25-30% (TEE 01/2019 with normal EF) NYHA III symptoms Volume status stable on exam today Continue coreg 25 mg BID Continue hydralazine 10 mg BID (Avoiding ACE/ARB in setting of CKD III-IV) Add imdur 15 mg daily.  By her last BMET, she is contraindicated for spironolactone (Cr > 2.0). Will follow Would avoid digoxin with CKD She is RV pacing >99% and is completely depending on her pacemaker in both the A and V today.  There are no options to decrease RV pacing.  Discussed options including medical management vs consideration for CRT upgrade. Pt is understandably not very excited about the prospect of another surgery.    3. CKD III-IV Last creatinine > 2.0.  No spiro. Avoid digoxin Would only use ACE/ARB/ARNI very cautiously. For now, substituting for imdur/hydralazine for afterload reduction.   Will discuss further with Dr. Caryl Comes.   Current medicines are reviewed at length with the patient today.   The patient does not have concerns regarding her medicines.  The following changes were made today:  Add imdur as above.   Labs/ tests ordered today include:  Orders Placed This Encounter  Procedures  . CUP PACEART INCLINIC DEVICE  CHECK   Disposition:   Follow up with  Dr. Caryl Comes in 4-6 weeks as scheduled.   Jacalyn Lefevre, PA-C  03/14/2020 10:48 AM  [facility] [address] [place] [address] [phone] (office) [phone] (fax)

## 2020-03-13 NOTE — Telephone Encounter (Signed)
Spoke with pt's daughter who states [name], [place] is recommending pt be seen in EP for possible PPM adjustment as pt's recent echo shows reduced EF of 25-30% per daughter.  Appointment scheduled for 03/14/2020 with Rebecca Eaton.  Pt's daughter verbalizes understanding and agrees with current plan.

## 2020-03-14 ENCOUNTER — Encounter: Payer: Self-pay | Admitting: Student

## 2020-03-14 ENCOUNTER — Ambulatory Visit: Payer: Medicare Other | Admitting: Student

## 2020-03-14 ENCOUNTER — Other Ambulatory Visit: Payer: Self-pay

## 2020-03-14 VITALS — BP 122/68 | HR 64 | Ht 64.0 in | Wt 154.8 lb

## 2020-03-14 DIAGNOSIS — N184 Chronic kidney disease, stage 4 (severe): Secondary | ICD-10-CM

## 2020-03-14 DIAGNOSIS — I5021 Acute systolic (congestive) heart failure: Secondary | ICD-10-CM | POA: Diagnosis not present

## 2020-03-14 DIAGNOSIS — R001 Bradycardia, unspecified: Secondary | ICD-10-CM

## 2020-03-14 DIAGNOSIS — I480 Paroxysmal atrial fibrillation: Secondary | ICD-10-CM | POA: Diagnosis not present

## 2020-03-14 LAB — CUP PACEART INCLINIC DEVICE CHECK
Battery Remaining Longevity: 67 mo
Battery Voltage: 2.96 V
Brady Statistic AP VP Percent: 99.45 %
Brady Statistic AP VS Percent: 0.03 %
Brady Statistic AS VP Percent: 0.32 %
Brady Statistic AS VS Percent: 0.2 %
Brady Statistic RA Percent Paced: 99.33 %
Brady Statistic RV Percent Paced: 99.77 %
Date Time Interrogation Session: 20210505095622
Implantable Lead Implant Date: 20180226
Implantable Lead Implant Date: 20180226
Implantable Lead Location: 753859
Implantable Lead Location: 753860
Implantable Lead Model: 5076
Implantable Lead Model: 5076
Implantable Pulse Generator Implant Date: 20180226
Lead Channel Impedance Value: 304 Ohm
Lead Channel Impedance Value: 418 Ohm
Lead Channel Impedance Value: 437 Ohm
Lead Channel Impedance Value: 475 Ohm
Lead Channel Pacing Threshold Amplitude: 0.625 V
Lead Channel Pacing Threshold Amplitude: 1.25 V
Lead Channel Pacing Threshold Pulse Width: 0.4 ms
Lead Channel Pacing Threshold Pulse Width: 0.4 ms
Lead Channel Sensing Intrinsic Amplitude: 1.375 mV
Lead Channel Sensing Intrinsic Amplitude: 1.5 mV
Lead Channel Sensing Intrinsic Amplitude: 15.75 mV
Lead Channel Sensing Intrinsic Amplitude: 15.75 mV
Lead Channel Setting Pacing Amplitude: 2 V
Lead Channel Setting Pacing Amplitude: 2.5 V
Lead Channel Setting Pacing Pulse Width: 0.4 ms
Lead Channel Setting Sensing Sensitivity: 2.8 mV

## 2020-03-14 MED ORDER — ISOSORBIDE MONONITRATE ER 30 MG PO TB24: 15.0000 mg | ORAL_TABLET | Freq: Every day | ORAL | 3 refills | Status: DC

## 2020-03-14 NOTE — Patient Instructions (Addendum)
Medication Instructions:  START ISOSORBIDE MONONITRATE (IMDUR) 15 mg Daily (1/2 TABLET) *If you need a refill on your cardiac medications before your next appointment, please call your pharmacy*   Lab Work: none If you have labs (blood work) drawn today and your tests are completely normal, you will receive your results only by: Marland Kitchen MyChart Message (if you have MyChart) OR . A paper copy in the mail If you have any lab test that is abnormal or we need to change your treatment, we will call you to review the results.   Testing/Procedures: none   Follow-Up: At [facility], you and your health needs are our priority.  As part of our continuing mission to provide you with exceptional heart care, we have created designated Provider Care Teams.  These Care Teams include your primary Cardiologist (physician) and Advanced Practice Providers (APPs -  Physician Assistants and Nurse Practitioners) who all work together to provide you with the care you need, when you need it.  Your next appointment:  With Dr Caryl Comes   Other Instructions Remote monitoring is used to monitor your Pacemaker  from home. This monitoring reduces the number of office visits required to check your device to one time per year. It allows [place] to keep an eye on the functioning of your device to ensure it is working properly. You are scheduled for a device check from home on 04/17/20. You may send your transmission at any time that day. If you have a wireless device, the transmission will be sent automatically. After your physician reviews your transmission, you will receive a postcard with your next transmission date.  Isosorbide Mononitrate extended-release tablets What is this medicine? ISOSORBIDE MONONITRATE (eye soe SOR bide mon oh NYE trate) is a vasodilator. It relaxes blood vessels, increasing the blood and oxygen supply to your heart. This medicine is used to prevent chest pain caused by angina. It will not help to stop an  episode of chest pain. This medicine may be used for other purposes; ask your health care provider or pharmacist if you have questions. COMMON BRAND NAME(S): Imdur, Isotrate ER What should I tell my health care provider before I take this medicine? They need to know if you have any of these conditions:  previous heart attack or heart failure  an unusual or allergic reaction to isosorbide mononitrate, nitrates, other medicines, foods, dyes, or preservatives  pregnant or trying to get pregnant  breast-feeding How should I use this medicine? Take this medicine by mouth with a glass of water. Follow the directions on the prescription label. Do not crush or chew. Take your medicine at regular intervals. Do not take your medicine more often than directed. Do not stop taking this medicine except on the advice of your doctor or health care professional. Talk to your pediatrician regarding the use of this medicine in children. Special care may be needed. Overdosage: If you think you have taken too much of this medicine contact a poison control center or emergency room at once. NOTE: This medicine is only for you. Do not share this medicine with others. What if I miss a dose? If you miss a dose, take it as soon as you can. If it is almost time for your next dose, take only that dose. Do not take double or extra doses. What may interact with this medicine? Do not take this medicine with any of the following medications:  medicines used to treat erectile dysfunction (ED) like avanafil, sildenafil, tadalafil, and vardenafil  riociguat This medicine may also interact with the following medications:  medicines for high blood pressure  other medicines for angina or heart failure This list may not describe all possible interactions. Give your health care provider a list of all the medicines, herbs, non-prescription drugs, or dietary supplements you use. Also tell them if you smoke, drink alcohol, or  use illegal drugs. Some items may interact with your medicine. What should I watch for while using this medicine? Check your heart rate and blood pressure regularly while you are taking this medicine. Ask your doctor or health care professional what your heart rate and blood pressure should be and when you should contact him or her. Tell your doctor or health care professional if you feel your medicine is no longer working. You may get dizzy. Do not drive, use machinery, or do anything that needs mental alertness until you know how this medicine affects you. To reduce the risk of dizzy or fainting spells, do not sit or stand up quickly, especially if you are an older patient. Alcohol can make you more dizzy, and increase flushing and rapid heartbeats. Avoid alcoholic drinks. Do not treat yourself for coughs, colds, or pain while you are taking this medicine without asking your doctor or health care professional for advice. Some ingredients may increase your blood pressure. What side effects may I notice from receiving this medicine? Side effects that you should report to your doctor or health care professional as soon as possible:  bluish discoloration of lips, fingernails, or palms of hands  irregular heartbeat, palpitations  low blood pressure  nausea, vomiting  persistent headache  unusually weak or tired Side effects that usually do not require medical attention (report to your doctor or health care professional if they continue or are bothersome):  flushing of the face or neck  rash This list may not describe all possible side effects. Call your doctor for medical advice about side effects. You may report side effects to FDA at 1-800-FDA-1088. Where should I keep my medicine? Keep out of the reach of children. Store between 15 and 30 degrees C (59 and 86 degrees F). Keep container tightly closed. Throw away any unused medicine after the expiration date. NOTE: This sheet is a summary.  It may not cover all possible information. If you have questions about this medicine, talk to your doctor, pharmacist, or health care provider.  2020 Elsevier/Gold Standard (2013-08-26 14:48:19)

## 2020-03-15 ENCOUNTER — Telehealth: Payer: Self-pay

## 2020-03-15 MED ORDER — CARVEDILOL 25 MG PO TABS: 12.5000 mg | ORAL_TABLET | Freq: Two times a day (BID) | ORAL | 3 refills | Status: DC

## 2020-03-15 NOTE — Telephone Encounter (Signed)
I spoke with the patient's daughter Butch Penny) with Andy's recommendation to reduce Carvedilol to 12.5 mg bid.  She verbalized understanding and will monitor BP.

## 2020-03-19 DIAGNOSIS — H903 Sensorineural hearing loss, bilateral: Secondary | ICD-10-CM | POA: Diagnosis not present

## 2020-03-21 DIAGNOSIS — H5213 Myopia, bilateral: Secondary | ICD-10-CM | POA: Diagnosis not present

## 2020-03-26 DIAGNOSIS — G8311 Monoplegia of lower limb affecting right dominant side: Secondary | ICD-10-CM | POA: Diagnosis not present

## 2020-03-26 DIAGNOSIS — G8312 Monoplegia of lower limb affecting left dominant side: Secondary | ICD-10-CM | POA: Diagnosis not present

## 2020-03-26 DIAGNOSIS — I5021 Acute systolic (congestive) heart failure: Secondary | ICD-10-CM | POA: Insufficient documentation

## 2020-03-27 ENCOUNTER — Ambulatory Visit: Payer: Medicare Other | Admitting: Internal Medicine

## 2020-03-27 ENCOUNTER — Other Ambulatory Visit: Payer: Self-pay

## 2020-03-27 VITALS — BP 121/75 | HR 83 | Wt 155.0 lb

## 2020-03-27 DIAGNOSIS — Z950 Presence of cardiac pacemaker: Secondary | ICD-10-CM

## 2020-03-27 DIAGNOSIS — I495 Sick sinus syndrome: Secondary | ICD-10-CM

## 2020-03-27 DIAGNOSIS — I5021 Acute systolic (congestive) heart failure: Secondary | ICD-10-CM

## 2020-03-27 MED ORDER — HYDRALAZINE HCL 10 MG PO TABS: 10.0000 mg | ORAL_TABLET | Freq: Every day | ORAL | 3 refills | Status: DC

## 2020-03-27 NOTE — Progress Notes (Signed)
Patient Care Team: Chesley Noon, MD as PCP - General (Family Medicine) Deboraha Sprang, MD as PCP - Cardiology (Cardiology)   HPI  [name] is a 86 y.o. female Seen for pacemaker follow-up with MR  She underwent device implantation by Dr. Carlyn Reichert 2/18 for symptomatic sinus node dysfunction and high-grade heart block resulting in nearly 100% ventricular pacing has paroxysmal atrial fibrillation for which she takes amiodarone and Rivaroxaban.  She is atrially dependent      She had atrial fibrillation with CVR-- cardioverted 9/17.  Taking amiodarone repeat cardioversion spring/20, 1/21  Feeling better in sinus rhythm.  But of late however, she has had increasing problems with exercise tolerance.  She was noted to be volume overloaded.  She was diuresed with some improvement.   Tolerating amiodarone.  No cough.      Date TSH LFTs Cr K Hgb  1/18  1.14      14  5/18    1.63    8/18 1.24(12/18)  - 21 1.47  11.8 (4/18)  1/19  37 1.00 3.4 11.3  12/19 1.65 14 1.51>1.15 4.2 13.0  3/20 0.951 25 1.31 3.4 11.6  1/21 1.42 16 1.90 3.9       DATE TEST EF   2/18 Echo   60-65 % Eccentric MR-mild  12/18  Echo  55-60%   3/20 TEE 50-55% PA sys 56/LAE  4/21 Echo  25%        Past Medical History:  Diagnosis Date  . Anxiety   . Aortic valve sclerosis   . Atrial fibrillation ([place])   . Basal cell carcinoma    hx; near eyes  . Chronic systolic CHF (congestive heart failure) (HCC)    Echocardiogram 03/2020: EF 25-30, mid-dist lat, dist ant, mid-dist inf, mid-dist septal and apical severe HK/AK, Gr 2 DD, mild reduced RVSF, RVSP 41.8, severe LAE, mod RAE, mild to mod MR, mild to mod TR, AoV sclerosis (no AS)  . GERD (gastroesophageal reflux disease)   . H/O one miscarriage   . Hard of hearing    has hearing aides but does not wear   . History of hiatal hernia   . History of kidney stones   . Hyperlipidemia   . Hypertension   . OA (osteoarthritis)    "right pointer; right  hip" (01/05/2017)  . Obesity   . Renal insufficiency   . SSS (sick sinus syndrome) ([place]) 01/05/2017   a. s/p MDT dual chamber PPM   . Tinnitus   . Varicose veins   . Wears glasses     Past Surgical History:  Procedure Laterality Date  . BREAST CYST EXCISION Right 1977   "benign"  . CARDIOVERSION N/A 07/25/2016   Procedure: CARDIOVERSION;  Surgeon: Lelon Perla, MD;  Location: [facility] ENDOSCOPY;  Service: Cardiovascular;  Laterality: N/A;  . CARDIOVERSION N/A 01/20/2019   Procedure: CARDIOVERSION;  Surgeon: Buford Dresser, MD;  Location[facility] ENDOSCOPY;  Service: Cardiovascular;  Laterality: N/A;  . CARDIOVERSION N/A 12/05/2019   Procedure: CARDIOVERSION;  Surgeon: Dorothy Spark, MD;  Location: [facility] ENDOSCOPY;  Service: Cardiovascular;  Laterality: N/A;  . CATARACT EXTRACTION W/ INTRAOCULAR LENS  IMPLANT, BILATERAL Bilateral   . DILATION AND CURETTAGE OF UTERUS    . NASAL SEPTUM SURGERY    . OVARIAN CYST REMOVAL  2002  . PACEMAKER IMPLANT N/A 01/05/2017   Procedure: Pacemaker Implant;  Surgeon: Will Meredith Leeds, MD;  Location: [place] CV LAB;  Service: Cardiovascular;  Laterality: N/A;  . TEE WITHOUT CARDIOVERSION N/A 12/16/2016   Procedure: TRANSESOPHAGEAL ECHOCARDIOGRAM (TEE);  Surgeon: Larey Dresser, MD;  Location: [place];  Service: Cardiovascular;  Laterality: N/A;  . TEE WITHOUT CARDIOVERSION N/A 01/20/2019   Procedure: TRANSESOPHAGEAL ECHOCARDIOGRAM (TEE);  Surgeon: Buford Dresser, MD;  Location: [facility] ENDOSCOPY;  Service: Cardiovascular;  Laterality: N/A;  . TONSILLECTOMY    . TOTAL HIP ARTHROPLASTY Left 12/18/2015   Procedure: LEFT TOTAL HIP ARTHROPLASTY ANTERIOR APPROACH;  Surgeon: Paralee Cancel, MD;  Location: [facility];  Service: Orthopedics;  Laterality: Left;    Current Outpatient Medications  Medication Sig Dispense Refill  . acetaminophen (TYLENOL) 500 MG tablet Take 1,000 mg by mouth at bedtime.     . ALPRAZolam (XANAX) 0.5 MG tablet Take 0.25-0.5 mg  by mouth at bedtime.     Marland Kitchen amiodarone (PACERONE) 100 MG tablet Take 2 tablets (200 mg total) by mouth daily. 90 tablet 3  . Biotin w/ Vitamins C & E (HAIR SKIN & NAILS GUMMIES PO) Take 1 capsule by mouth daily.    . carvedilol (COREG) 25 MG tablet Take 0.5 tablets (12.5 mg total) by mouth 2 (two) times daily. 90 tablet 3  . Cyanocobalamin (B-12) 2500 MCG TABS Take 625 mcg by mouth every other day.    . furosemide (LASIX) 40 MG tablet Take 60 mg by mouth every morning. 40 my in the am, 20 mg in the pm    . hydrALAZINE (APRESOLINE) 10 MG tablet Take 1 tablet (10 mg total) by mouth daily. 60 tablet 3  . isosorbide mononitrate (IMDUR) 30 MG 24 hr tablet Take 0.5 tablets (15 mg total) by mouth daily. 90 tablet 3  . Magnesium 400 MG CAPS Take 400 mg by mouth 3 (three) times a week.    . Multiple Vitamin (MULTIVITAMIN WITH MINERALS) TABS tablet Take 1 tablet by mouth daily.    . Potassium 99 MG TABS Take 99 mg by mouth daily.    Alveda Reasons 15 MG TABS tablet TAKE 1 TABLET(15 MG) BY MOUTH DAILY WITH SUPPER 30 tablet 6  . busPIRone (BUSPAR) 5 MG tablet Take 5 mg by mouth 2 (two) times daily as needed (for stress/anxiety).     Vladimir Faster Glycol-Propyl Glycol (SYSTANE OP) Place 1 drop into both eyes daily as needed (dry eyes).     No current facility-administered medications for this visit.        Review of Systems negative except from HPI and PMH  Physical Exam BP 121/75   Pulse 83   Wt 155 lb (70.3 kg)   BMI 26.61 kg/m  Well developed and well nourished in no acute distress HENT normal Neck supple with JVP-flat Clear Device pocket well healed; without hematoma or erythema.  There is no tethering  Regular rate and rhythm, no  murmur Abd-soft with active BS No Clubbing cyanosis   edema Skin-warm and dry A & Oriented  Grossly normal sensory and motor function  ECG AV pacing QRS 293msec  Assessment and  Plan  Atrial fibrillation  Sinus node dysfunction-profound   HFrEF  Chronic  class 3  Pacemaker MRI Medtronic not interrogated today  renal failure grade 3  MR moderate  1AVB-profound   High Risk Medication Surveillance amiodarone  Stressful home situation   Tolerating amiodarone.  Holding sinus rhythm.  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  Probably important to exclude alternative mechanisms and  so we will arrange for a Myoview scan  She had a terrible problem with her initial implantation related to pocket hematoma and so is reluctant to undertake a repeat procedure.  We have agreed to regroup in a couple of months to assess functional status and make a determination as to whether to proceed with CRT upgrade.  She is euvolemic today.  We will continue her on her current dose of diuretics.  We will plan repeat amiodarone labs when she returns

## 2020-03-27 NOTE — Patient Instructions (Addendum)
Medication Instructions:  Your physician recommends that you continue on your current medications as directed. Please refer to the Current Medication list given to you today.  Please make sure you take your Imdur in the evening  Change the time you take your Hydralazine to bedtime.  Labwork: None ordered.  Testing/Procedures: Your physician has requested that you have a lexiscan myoview. For further information please visit [email or web address]. Please follow instruction sheet, as given.   Follow-Up: Your physician wants you to follow-up in: 2 months with Dr Caryl Comes. You will receive a reminder letter in the mail two months in advance. If you don't receive a letter, please call our office to schedule the follow-up appointment.  Remote monitoring is used to monitor your Pacemaker of ICD from home. This monitoring reduces the number of office visits required to check your device to one time per year. It allows [place] to keep an eye on the functioning of your device to ensure it is working properly.   Any Other Special Instructions Will Be Listed Below (If Applicable).  Spoke with pt on phone and advised Dr Caryl Comes has decided he would like to do a [employer].  Pt advised she will be contacted to schedule scan and to review instructions.  Pt verbalizes understanding.  If you need a refill on your cardiac medications before your next appointment, please call your pharmacy.

## 2020-03-28 ENCOUNTER — Encounter (HOSPITAL_COMMUNITY): Payer: Self-pay | Admitting: *Deleted

## 2020-03-28 ENCOUNTER — Telehealth (HOSPITAL_COMMUNITY): Payer: Self-pay | Admitting: *Deleted

## 2020-03-28 NOTE — Telephone Encounter (Signed)
Patient given detailed instructions per Myocardial Perfusion Study Information Sheet for the test on 04/04/2020 at 1030. Patient notified to arrive 15 minutes early and that it is imperative to arrive on time for appointment to keep from having the test rescheduled.  If you need to cancel or reschedule your appointment, please call the office within 24 hours of your appointment. . Patient verbalized understanding.[name], Lucius Conn letter with instructions.

## 2020-04-04 ENCOUNTER — Other Ambulatory Visit: Payer: Self-pay

## 2020-04-04 ENCOUNTER — Ambulatory Visit (HOSPITAL_COMMUNITY): Payer: Medicare Other | Attending: Cardiology

## 2020-04-04 DIAGNOSIS — I5021 Acute systolic (congestive) heart failure: Secondary | ICD-10-CM | POA: Diagnosis not present

## 2020-04-04 LAB — MYOCARDIAL PERFUSION IMAGING
LV dias vol: 111 mL (ref 46–106)
LV sys vol: 70 mL
Peak HR: 68 {beats}/min
Rest HR: 65 {beats}/min
SDS: 2
SRS: 2
SSS: 4
TID: 1

## 2020-04-04 MED ORDER — REGADENOSON 0.4 MG/5ML IV SOLN
0.4000 mg | Freq: Once | INTRAVENOUS | Status: AC
  Administered 2020-04-04: 0.4 mg via INTRAVENOUS

## 2020-04-04 MED ORDER — TECHNETIUM TC 99M TETROFOSMIN IV KIT
32.7000 | PACK | Freq: Once | INTRAVENOUS | Status: AC | PRN
  Administered 2020-04-04: 32.7 via INTRAVENOUS
  Filled 2020-04-04: qty 33

## 2020-04-04 MED ORDER — TECHNETIUM TC 99M TETROFOSMIN IV KIT
10.9000 | PACK | Freq: Once | INTRAVENOUS | Status: AC | PRN
  Administered 2020-04-04: 10.9 via INTRAVENOUS
  Filled 2020-04-04: qty 11

## 2020-04-06 ENCOUNTER — Telehealth: Payer: Self-pay

## 2020-04-06 NOTE — Telephone Encounter (Signed)
Pt returned phone call.  She indicates she has been fatigued the past couple of days.  She had Lexiscan on 04/04/20 (coinicidentally right around the same time she went into AF).  Aside from dizziness she felt during [place], she ahs not had other symptoms.    D/w pt her current meds, it seems she is only taking Amiodarone 100mg  daily (not 200mg  as indicated on record), all other meds she is taking ordered.    Pt requested that I let Dr. Caryl Comes know that she has put a lot of thought into his recommendation for device upgrade to CRT.  She indicated that if he feels it is something that can help her and recommends she not wait any longer then she would like to proceed.    Advised pt I would forward info to AF clinic/ Roderic Palau, NP regarding current AF status and need to confirm dosage of Amiodarone.  Message also being sent to Dr. Caryl Comes regarding CRT upgrade.

## 2020-04-06 NOTE — Telephone Encounter (Signed)
AF alert >24 hours and ongoing w/ VP 60's.   Pt has known history of AF, current meds include Amiodarone 200mg  daily, Carvedilol 12.5mg  BID and Xarelto for [place].    Attempted to reach patient, spoke with person identified as "caretaker"  Requested she have patient callback.    Need to determine if pt symptomatic, med compliance.  Can have pt send manual transmission to determine if still in AF.

## 2020-04-10 NOTE — Telephone Encounter (Signed)
[name]  why dont we go ahead and arrange an opportunity for CRT upgrade Thanks SK

## 2020-04-12 ENCOUNTER — Telehealth: Payer: Self-pay | Admitting: Internal Medicine

## 2020-04-12 MED ORDER — AMIODARONE HCL 200 MG PO TABS: 200.0000 mg | ORAL_TABLET | Freq: Every day | ORAL | 3 refills | Status: DC

## 2020-04-12 NOTE — Telephone Encounter (Signed)
Spoke with pt re: scheduling CRT-D upgrade procedure.  Pt states she would prefer to wait and see Dr Caryl Comes again before scheduling.  Pt has an appointment scheduled for 06/25/2020 at 1045 with Dr Caryl Comes.  Will notify Dr Caryl Comes of pt's decision to wait on scheduling procedure.  Routed to Dr Caryl Comes. Pt verbalizes understanding and agrees with current plan.

## 2020-04-12 NOTE — Telephone Encounter (Signed)
Spoke with pt who is calling to clarify her Amiodarone dose.  Pt advised she currently has 100mg  tablets and should be taking 200mg  - 2 tablets daily.  Pt states she needs a new Rx sent to pharmacy.  Amiodarone 200mg  - 1 tablet daily by mouth sent to pharmacy on file.  Advised pt new Rx will be 200mg  tablets and she will only need to take 1 tablet daily by mouth.  Pt verbalizes understanding,  Discussed scheduling CRT-D upgrade as recommended by Dr Caryl Comes.  Pt states she would prefer to wait until she sees Dr Caryl Comes again in August as she is feeling well now and has help around the house.  Will let Dr Caryl Comes know pt would like to hold off on CRT-D upgrade for now.  Pt verbalizes understanding and agrees with current plan.

## 2020-04-12 NOTE — Telephone Encounter (Signed)
New message   Pt c/o medication issue:  1. Name of Medication: amiodarone (PACERONE) 100 MG tablet  2. How are you currently taking this medication (dosage and times per day)? As written  3. Are you having a reaction (difficulty breathing--STAT)?no   4. What is your medication issue? Patient states that there is a discrepancy with the dosage on this medication. Please call.

## 2020-04-14 NOTE — Telephone Encounter (Signed)
M  is that the earliest ot was that what she wanted  Thanks SK

## 2020-04-17 ENCOUNTER — Ambulatory Visit (INDEPENDENT_AMBULATORY_CARE_PROVIDER_SITE_OTHER): Payer: Medicare Other | Admitting: *Deleted

## 2020-04-17 DIAGNOSIS — I495 Sick sinus syndrome: Secondary | ICD-10-CM

## 2020-04-17 DIAGNOSIS — I480 Paroxysmal atrial fibrillation: Secondary | ICD-10-CM

## 2020-04-17 LAB — CUP PACEART REMOTE DEVICE CHECK
Battery Remaining Longevity: 67 mo
Battery Voltage: 2.96 V
Brady Statistic AP VP Percent: 0 %
Brady Statistic AP VS Percent: 0 %
Brady Statistic AS VP Percent: 0 %
Brady Statistic AS VS Percent: 0 %
Brady Statistic RA Percent Paced: 0.71 %
Brady Statistic RV Percent Paced: 99.64 %
Date Time Interrogation Session: 20210608021621
Implantable Lead Implant Date: 20180226
Implantable Lead Implant Date: 20180226
Implantable Lead Location: 753859
Implantable Lead Location: 753860
Implantable Lead Model: 5076
Implantable Lead Model: 5076
Implantable Pulse Generator Implant Date: 20180226
Lead Channel Impedance Value: 266 Ohm
Lead Channel Impedance Value: 342 Ohm
Lead Channel Impedance Value: 399 Ohm
Lead Channel Impedance Value: 437 Ohm
Lead Channel Pacing Threshold Amplitude: 0.75 V
Lead Channel Pacing Threshold Amplitude: 1.25 V
Lead Channel Pacing Threshold Pulse Width: 0.4 ms
Lead Channel Pacing Threshold Pulse Width: 0.4 ms
Lead Channel Sensing Intrinsic Amplitude: 0.875 mV
Lead Channel Sensing Intrinsic Amplitude: 0.875 mV
Lead Channel Sensing Intrinsic Amplitude: 7.875 mV
Lead Channel Sensing Intrinsic Amplitude: 7.875 mV
Lead Channel Setting Pacing Amplitude: 2 V
Lead Channel Setting Pacing Amplitude: 2.5 V
Lead Channel Setting Pacing Pulse Width: 0.4 ms
Lead Channel Setting Sensing Sensitivity: 2.8 mV

## 2020-04-18 DIAGNOSIS — E7800 Pure hypercholesterolemia, unspecified: Secondary | ICD-10-CM | POA: Diagnosis not present

## 2020-04-18 DIAGNOSIS — I10 Essential (primary) hypertension: Secondary | ICD-10-CM | POA: Diagnosis not present

## 2020-04-18 DIAGNOSIS — I482 Chronic atrial fibrillation, unspecified: Secondary | ICD-10-CM | POA: Diagnosis not present

## 2020-04-18 DIAGNOSIS — K219 Gastro-esophageal reflux disease without esophagitis: Secondary | ICD-10-CM | POA: Diagnosis not present

## 2020-04-18 NOTE — Progress Notes (Signed)
Remote pacemaker transmission.   

## 2020-04-25 ENCOUNTER — Telehealth: Payer: Self-pay | Admitting: Internal Medicine

## 2020-04-25 NOTE — Telephone Encounter (Signed)
Per pt not feeling well and wanted to ask some questions about suggested procedure Appt made with Dr Caryl Comes for 6/21/21at 3:30 pm .cy

## 2020-04-25 NOTE — Telephone Encounter (Signed)
B[place] is calling requesting to speak with Rosann Auerbach to ask her some questions about a procedure Dr. Caryl Comes previously discussed with her. Please advise.

## 2020-04-30 ENCOUNTER — Encounter: Payer: Self-pay | Admitting: Internal Medicine

## 2020-04-30 ENCOUNTER — Other Ambulatory Visit: Payer: Self-pay

## 2020-04-30 ENCOUNTER — Ambulatory Visit: Payer: Medicare Other | Admitting: Internal Medicine

## 2020-04-30 VITALS — BP 124/68 | HR 74 | Ht 64.5 in | Wt 158.0 lb

## 2020-04-30 DIAGNOSIS — R001 Bradycardia, unspecified: Secondary | ICD-10-CM

## 2020-04-30 DIAGNOSIS — I495 Sick sinus syndrome: Secondary | ICD-10-CM

## 2020-04-30 DIAGNOSIS — I5021 Acute systolic (congestive) heart failure: Secondary | ICD-10-CM

## 2020-04-30 DIAGNOSIS — I480 Paroxysmal atrial fibrillation: Secondary | ICD-10-CM | POA: Diagnosis not present

## 2020-04-30 DIAGNOSIS — Z950 Presence of cardiac pacemaker: Secondary | ICD-10-CM

## 2020-04-30 NOTE — Progress Notes (Signed)
Good night     Patient Care Team: Chesley Noon, MD as PCP - General (Family Medicine) Deboraha Sprang, MD as PCP - Cardiology (Cardiology)   HPI  [name] is a 86 y.o. female Seen for pacemaker follow-up with MR  She underwent device implantation by Dr. Carlyn Reichert 2/18 for symptomatic sinus node dysfunction and high-grade heart block resulting in nearly 100% ventricular pacing.   Persistent atrial fibrillation for which she takes amiodarone and Rivaroxaban.  She is atrially dependent  Repeat cardioversion spring/20, 1/21  Noted to be in afib again  Increasing lassitude and fatigue.  Difficulty with walking across the room.  Describes lightheaded heaviness.  Some shortness of breath.  No edema.  Symptoms seems to abate after 10 minutes or so of sitting down.       Date TSH LFTs Cr K Hgb  1/18  1.14      14  5/18    1.63    8/18 1.24(12/18)  - 21 1.47  11.8 (4/18)  1/19  37 1.00 3.4 11.3  12/19 1.65 14 1.51>1.15 4.2 13.0  3/20 0.951 25 1.31 3.4 11.6  1/21 1.42 16 1.90 3.9   4/21   14 2.08 3.7 12.1      DATE TEST EF   2/18 Echo   60-65 % Eccentric MR-mild  12/18  Echo  55-60%   3/20 TEE 50-55% PA sys 56/LAE  4/21 Echo  25%   5/21 Myoview 35% No ischemia       Past Medical History:  Diagnosis Date  . Anxiety   . Aortic valve sclerosis   . Atrial fibrillation ([place])   . Basal cell carcinoma    hx; near eyes  . Chronic systolic CHF (congestive heart failure) (HCC)    Echocardiogram 03/2020: EF 25-30, mid-dist lat, dist ant, mid-dist inf, mid-dist septal and apical severe HK/AK, Gr 2 DD, mild reduced RVSF, RVSP 41.8, severe LAE, mod RAE, mild to mod MR, mild to mod TR, AoV sclerosis (no AS)  . GERD (gastroesophageal reflux disease)   . H/O one miscarriage   . Hard of hearing    has hearing aides but does not wear   . History of hiatal hernia   . History of kidney stones   . Hyperlipidemia   . Hypertension   . OA (osteoarthritis)    "right pointer; right  hip" (01/05/2017)  . Obesity   . Renal insufficiency   . SSS (sick sinus syndrome) ([place]) 01/05/2017   a. s/p MDT dual chamber PPM   . Tinnitus   . Varicose veins   . Wears glasses     Past Surgical History:  Procedure Laterality Date  . BREAST CYST EXCISION Right 1977   "benign"  . CARDIOVERSION N/A 07/25/2016   Procedure: CARDIOVERSION;  Surgeon: Lelon Perla, MD;  Location: [facility] ENDOSCOPY;  Service: Cardiovascular;  Laterality: N/A;  . CARDIOVERSION N/A 01/20/2019   Procedure: CARDIOVERSION;  Surgeon: Buford Dresser, MD;  Location: [facility] ENDOSCOPY;  Service: Cardiovascular;  Laterality: N/A;  . CARDIOVERSION N/A 12/05/2019   Procedure: CARDIOVERSION;  Surgeon: Dorothy Spark, MD;  Location: [facility] ENDOSCOPY;  Service: Cardiovascular;  Laterality: N/A;  . CATARACT EXTRACTION W/ INTRAOCULAR LENS  IMPLANT, BILATERAL Bilateral   . DILATION AND CURETTAGE OF UTERUS    . NASAL SEPTUM SURGERY    . OVARIAN CYST REMOVAL  2002  . PACEMAKER IMPLANT N/A 01/05/2017   Procedure: Pacemaker Implant;  Surgeon: Will Meredith Leeds, MD;  Location: [facility]  INVASIVE CV LAB;  Service: Cardiovascular;  Laterality: N/A;  . TEE WITHOUT CARDIOVERSION N/A 12/16/2016   Procedure: TRANSESOPHAGEAL ECHOCARDIOGRAM (TEE);  Surgeon: Larey Dresser, MD;  Location: [place];  Service: Cardiovascular;  Laterality: N/A;  . TEE WITHOUT CARDIOVERSION N/A 01/20/2019   Procedure: TRANSESOPHAGEAL ECHOCARDIOGRAM (TEE);  Surgeon: Buford Dresser, MD;  Location: [facility] ENDOSCOPY;  Service: Cardiovascular;  Laterality: N/A;  . TONSILLECTOMY    . TOTAL HIP ARTHROPLASTY Left 12/18/2015   Procedure: LEFT TOTAL HIP ARTHROPLASTY ANTERIOR APPROACH;  Surgeon: Paralee Cancel, MD;  Location: [facility] ORS;  Service: Orthopedics;  Laterality: Left;    Current Outpatient Medications  Medication Sig Dispense Refill  . acetaminophen (TYLENOL) 500 MG tablet Take 1,000 mg by mouth at bedtime.     . ALPRAZolam (XANAX) 0.5 MG tablet Take 0.25-0.5 mg  by mouth at bedtime.     Marland Kitchen amiodarone (PACERONE) 200 MG tablet Take 1 tablet (200 mg total) by mouth daily. 90 tablet 3  . Biotin w/ Vitamins C & E (HAIR SKIN & NAILS GUMMIES PO) Take 1 capsule by mouth daily.    . busPIRone (BUSPAR) 5 MG tablet Take 5 mg by mouth 2 (two) times daily as needed (for stress/anxiety).     . carvedilol (COREG) 25 MG tablet Take 0.5 tablets (12.5 mg total) by mouth 2 (two) times daily. 90 tablet 3  . Cyanocobalamin (B-12) 2500 MCG TABS Take 625 mcg by mouth every other day.    . furosemide (LASIX) 40 MG tablet Take 60 mg by mouth every morning. 40 my in the am, 20 mg in the pm    . hydrALAZINE (APRESOLINE) 10 MG tablet Take 1 tablet (10 mg total) by mouth daily. 60 tablet 3  . isosorbide mononitrate (IMDUR) 30 MG 24 hr tablet Take 0.5 tablets (15 mg total) by mouth daily. 90 tablet 3  . Magnesium 400 MG CAPS Take 400 mg by mouth 3 (three) times a week.    . Multiple Vitamin (MULTIVITAMIN WITH MINERALS) TABS tablet Take 1 tablet by mouth daily.    Vladimir Faster Glycol-Propyl Glycol (SYSTANE OP) Place 1 drop into both eyes daily as needed (dry eyes).    . Potassium 99 MG TABS Take 99 mg by mouth daily.    Alveda Reasons 15 MG TABS tablet TAKE 1 TABLET(15 MG) BY MOUTH DAILY WITH SUPPER 30 tablet 6   No current facility-administered medications for this visit.        Review of Systems negative except from HPI and PMH  Physical Exam BP 124/68   Pulse 74   Ht 5' 4.5" (1.638 m)   Wt 158 lb (71.7 kg)   SpO2 94%   BMI 26.70 kg/m  Well developed and well nourished in no acute distress HENT normal Neck supple with JVP-flat Clear Device pocket well healed; without hematoma or erythema.  There is no tethering  Regular rate and rhythm, no  murmur Abd-soft with active BS No Clubbing cyanosis   edema Skin-warm and dry A & Oriented  Grossly normal sensory and motor function  ECG AV pacing QRS 23msec  Assessment and  Plan  Atrial fibrillation-persistent  Sinus  node dysfunction-profound   HFrEF  Chronic class 3  Pacemaker MRI Medtronic   renal failure grade 3 acute/chronic  MR moderate  1AVB-profound   High Risk Medication Surveillance amiodarone    Has reverted to atrial fibrillation.  Discussed to general strategies of trying to improve cardiovascular performance.  The first is to restore sinus rhythm  and the second is to upgrade her device to CRT given the profound change in left ventricular systolic function related temporally to right ventricular apical pacing.  Given the issues of anticoagulation, they will need to be done sequentially.  I have suggested that cardioversion would be the preferred first choice as sinus rhythm would make her more easily able to tolerate CRT procedure.  We would need about 3+ weeks or so following cardioversion prior to undertaking CRT.  Not sure whether she has orthostasis or not.  Vital signs today were suggestive but not confirming.  Amiodarone could be making this worse but this may explain some of her weakness that she walks.  Also had a lengthy discussion regarding end-of-life issues.  We discussed CODE STATUS, the limitations of her living will, the importance of preparing her dying in terms of relationships.  She will discuss these further with her family.

## 2020-04-30 NOTE — H&P (View-Only) (Signed)
Good night     Patient Care Team: Chesley Noon, MD as PCP - General (Family Medicine) Deboraha Sprang, MD as PCP - Cardiology (Cardiology)   HPI  [name] is a 86 y.o. female Seen for pacemaker follow-up with MR  She underwent device implantation by Dr. Carlyn Reichert 2/18 for symptomatic sinus node dysfunction and high-grade heart block resulting in nearly 100% ventricular pacing.   Persistent atrial fibrillation for which she takes amiodarone and Rivaroxaban.  She is atrially dependent  Repeat cardioversion spring/20, 1/21  Noted to be in afib again  Increasing lassitude and fatigue.  Difficulty with walking across the room.  Describes lightheaded heaviness.  Some shortness of breath.  No edema.  Symptoms seems to abate after 10 minutes or so of sitting down.       Date TSH LFTs Cr K Hgb  1/18  1.14      14  5/18    1.63    8/18 1.24(12/18)  - 21 1.47  11.8 (4/18)  1/19  37 1.00 3.4 11.3  12/19 1.65 14 1.51>1.15 4.2 13.0  3/20 0.951 25 1.31 3.4 11.6  1/21 1.42 16 1.90 3.9   4/21   14 2.08 3.7 12.1      DATE TEST EF   2/18 Echo   60-65 % Eccentric MR-mild  12/18  Echo  55-60%   3/20 TEE 50-55% PA sys 56/LAE  4/21 Echo  25%   5/21 Myoview 35% No ischemia       Past Medical History:  Diagnosis Date  . Anxiety   . Aortic valve sclerosis   . Atrial fibrillation ([place])   . Basal cell carcinoma    hx; near eyes  . Chronic systolic CHF (congestive heart failure) (HCC)    Echocardiogram 03/2020: EF 25-30, mid-dist lat, dist ant, mid-dist inf, mid-dist septal and apical severe HK/AK, Gr 2 DD, mild reduced RVSF, RVSP 41.8, severe LAE, mod RAE, mild to mod MR, mild to mod TR, AoV sclerosis (no AS)  . GERD (gastroesophageal reflux disease)   . H/O one miscarriage   . Hard of hearing    has hearing aides but does not wear   . History of hiatal hernia   . History of kidney stones   . Hyperlipidemia   . Hypertension   . OA (osteoarthritis)    "right pointer; right  hip" (01/05/2017)  . Obesity   . Renal insufficiency   . SSS (sick sinus syndrome) ([place]) 01/05/2017   a. s/p MDT dual chamber PPM   . Tinnitus   . Varicose veins   . Wears glasses     Past Surgical History:  Procedure Laterality Date  . BREAST CYST EXCISION Right 1977   "benign"  . CARDIOVERSION N/A 07/25/2016   Procedure: CARDIOVERSION;  Surgeon: Lelon Perla, MD;  Location: [facility] ENDOSCOPY;  Service: Cardiovascular;  Laterality: N/A;  . CARDIOVERSION N/A 01/20/2019   Procedure: CARDIOVERSION;  Surgeon: Buford Dresser, MD;  Location: [facility] ENDOSCOPY;  Service: Cardiovascular;  Laterality: N/A;  . CARDIOVERSION N/A 12/05/2019   Procedure: CARDIOVERSION;  Surgeon: Dorothy Spark, MD;  Location: [facility] ENDOSCOPY;  Service: Cardiovascular;  Laterality: N/A;  . CATARACT EXTRACTION W/ INTRAOCULAR LENS  IMPLANT, BILATERAL Bilateral   . DILATION AND CURETTAGE OF UTERUS    . NASAL SEPTUM SURGERY    . OVARIAN CYST REMOVAL  2002  . PACEMAKER IMPLANT N/A 01/05/2017   Procedure: Pacemaker Implant;  Surgeon: Will Meredith Leeds, MD;  Location: [facility]  INVASIVE CV LAB;  Service: Cardiovascular;  Laterality: N/A;  . TEE WITHOUT CARDIOVERSION N/A 12/16/2016   Procedure: TRANSESOPHAGEAL ECHOCARDIOGRAM (TEE);  Surgeon: Larey Dresser, MD;  Location: [place];  Service: Cardiovascular;  Laterality: N/A;  . TEE WITHOUT CARDIOVERSION N/A 01/20/2019   Procedure: TRANSESOPHAGEAL ECHOCARDIOGRAM (TEE);  Surgeon: Buford Dresser, MD;  Location: [facility] ENDOSCOPY;  Service: Cardiovascular;  Laterality: N/A;  . TONSILLECTOMY    . TOTAL HIP ARTHROPLASTY Left 12/18/2015   Procedure: LEFT TOTAL HIP ARTHROPLASTY ANTERIOR APPROACH;  Surgeon: Paralee Cancel, MD;  Location: [facility] ORS;  Service: Orthopedics;  Laterality: Left;    Current Outpatient Medications  Medication Sig Dispense Refill  . acetaminophen (TYLENOL) 500 MG tablet Take 1,000 mg by mouth at bedtime.     . ALPRAZolam (XANAX) 0.5 MG tablet Take 0.25-0.5 mg  by mouth at bedtime.     Marland Kitchen amiodarone (PACERONE) 200 MG tablet Take 1 tablet (200 mg total) by mouth daily. 90 tablet 3  . Biotin w/ Vitamins C & E (HAIR SKIN & NAILS GUMMIES PO) Take 1 capsule by mouth daily.    . busPIRone (BUSPAR) 5 MG tablet Take 5 mg by mouth 2 (two) times daily as needed (for stress/anxiety).     . carvedilol (COREG) 25 MG tablet Take 0.5 tablets (12.5 mg total) by mouth 2 (two) times daily. 90 tablet 3  . Cyanocobalamin (B-12) 2500 MCG TABS Take 625 mcg by mouth every other day.    . furosemide (LASIX) 40 MG tablet Take 60 mg by mouth every morning. 40 my in the am, 20 mg in the pm    . hydrALAZINE (APRESOLINE) 10 MG tablet Take 1 tablet (10 mg total) by mouth daily. 60 tablet 3  . isosorbide mononitrate (IMDUR) 30 MG 24 hr tablet Take 0.5 tablets (15 mg total) by mouth daily. 90 tablet 3  . Magnesium 400 MG CAPS Take 400 mg by mouth 3 (three) times a week.    . Multiple Vitamin (MULTIVITAMIN WITH MINERALS) TABS tablet Take 1 tablet by mouth daily.    Vladimir Faster Glycol-Propyl Glycol (SYSTANE OP) Place 1 drop into both eyes daily as needed (dry eyes).    . Potassium 99 MG TABS Take 99 mg by mouth daily.    Alveda Reasons 15 MG TABS tablet TAKE 1 TABLET(15 MG) BY MOUTH DAILY WITH SUPPER 30 tablet 6   No current facility-administered medications for this visit.        Review of Systems negative except from HPI and PMH  Physical Exam BP 124/68   Pulse 74   Ht 5' 4.5" (1.638 m)   Wt 158 lb (71.7 kg)   SpO2 94%   BMI 26.70 kg/m  Well developed and well nourished in no acute distress HENT normal Neck supple with JVP-flat Clear Device pocket well healed; without hematoma or erythema.  There is no tethering  Regular rate and rhythm, no  murmur Abd-soft with active BS No Clubbing cyanosis   edema Skin-warm and dry A & Oriented  Grossly normal sensory and motor function  ECG AV pacing QRS 222msec  Assessment and  Plan  Atrial fibrillation-persistent  Sinus  node dysfunction-profound   HFrEF  Chronic class 3  Pacemaker MRI Medtronic   renal failure grade 3 acute/chronic  MR moderate  1AVB-profound   High Risk Medication Surveillance amiodarone    Has reverted to atrial fibrillation.  Discussed to general strategies of trying to improve cardiovascular performance.  The first is to restore sinus rhythm  and the second is to upgrade her device to CRT given the profound change in left ventricular systolic function related temporally to right ventricular apical pacing.  Given the issues of anticoagulation, they will need to be done sequentially.  I have suggested that cardioversion would be the preferred first choice as sinus rhythm would make her more easily able to tolerate CRT procedure.  We would need about 3+ weeks or so following cardioversion prior to undertaking CRT.  Not sure whether she has orthostasis or not.  Vital signs today were suggestive but not confirming.  Amiodarone could be making this worse but this may explain some of her weakness that she walks.  Also had a lengthy discussion regarding end-of-life issues.  We discussed CODE STATUS, the limitations of her living will, the importance of preparing her dying in terms of relationships.  She will discuss these further with her family.

## 2020-04-30 NOTE — H&P (View-Only) (Signed)
Good night     Patient Care Team: Chesley Noon, MD as PCP - General (Family Medicine) Deboraha Sprang, MD as PCP - Cardiology (Cardiology)   HPI  [name] is a 86 y.o. female Seen for pacemaker follow-up with MR  She underwent device implantation by Dr. Carlyn Reichert 2/18 for symptomatic sinus node dysfunction and high-grade heart block resulting in nearly 100% ventricular pacing.   Persistent atrial fibrillation for which she takes amiodarone and Rivaroxaban.  She is atrially dependent  Repeat cardioversion spring/20, 1/21  Noted to be in afib again  Increasing lassitude and fatigue.  Difficulty with walking across the room.  Describes lightheaded heaviness.  Some shortness of breath.  No edema.  Symptoms seems to abate after 10 minutes or so of sitting down.       Date TSH LFTs Cr K Hgb  1/18  1.14      14  5/18    1.63    8/18 1.24(12/18)  - 21 1.47  11.8 (4/18)  1/19  37 1.00 3.4 11.3  12/19 1.65 14 1.51>1.15 4.2 13.0  3/20 0.951 25 1.31 3.4 11.6  1/21 1.42 16 1.90 3.9   4/21   14 2.08 3.7 12.1      DATE TEST EF   2/18 Echo   60-65 % Eccentric MR-mild  12/18  Echo  55-60%   3/20 TEE 50-55% PA sys 56/LAE  4/21 Echo  25%   5/21 Myoview 35% No ischemia       Past Medical History:  Diagnosis Date  . Anxiety   . Aortic valve sclerosis   . Atrial fibrillation ([place])   . Basal cell carcinoma    hx; near eyes  . Chronic systolic CHF (congestive heart failure) (HCC)    Echocardiogram 03/2020: EF 25-30, mid-dist lat, dist ant, mid-dist inf, mid-dist septal and apical severe HK/AK, Gr 2 DD, mild reduced RVSF, RVSP 41.8, severe LAE, mod RAE, mild to mod MR, mild to mod TR, AoV sclerosis (no AS)  . GERD (gastroesophageal reflux disease)   . H/O one miscarriage   . Hard of hearing    has hearing aides but does not wear   . History of hiatal hernia   . History of kidney stones   . Hyperlipidemia   . Hypertension   . OA (osteoarthritis)    "right pointer; right  hip" (01/05/2017)  . Obesity   . Renal insufficiency   . SSS (sick sinus syndrome) ([place]) 01/05/2017   a. s/p MDT dual chamber PPM   . Tinnitus   . Varicose veins   . Wears glasses     Past Surgical History:  Procedure Laterality Date  . BREAST CYST EXCISION Right 1977   "benign"  . CARDIOVERSION N/A 07/25/2016   Procedure: CARDIOVERSION;  Surgeon: Lelon Perla, MD;  Location: [facility] ENDOSCOPY;  Service: Cardiovascular;  Laterality: N/A;  . CARDIOVERSION N/A 01/20/2019   Procedure: CARDIOVERSION;  Surgeon: Buford Dresser, MD;  Location: [facility] ENDOSCOPY;  Service: Cardiovascular;  Laterality: N/A;  . CARDIOVERSION N/A 12/05/2019   Procedure: CARDIOVERSION;  Surgeon: Dorothy Spark, MD;  Location: [facility] ENDOSCOPY;  Service: Cardiovascular;  Laterality: N/A;  . CATARACT EXTRACTION W/ INTRAOCULAR LENS  IMPLANT, BILATERAL Bilateral   . DILATION AND CURETTAGE OF UTERUS    . NASAL SEPTUM SURGERY    . OVARIAN CYST REMOVAL  2002  . PACEMAKER IMPLANT N/A 01/05/2017   Procedure: Pacemaker Implant;  Surgeon: Will Meredith Leeds, MD;  Location: [facility]  INVASIVE CV LAB;  Service: Cardiovascular;  Laterality: N/A;  . TEE WITHOUT CARDIOVERSION N/A 12/16/2016   Procedure: TRANSESOPHAGEAL ECHOCARDIOGRAM (TEE);  Surgeon: Larey Dresser, MD;  Location: [place];  Service: Cardiovascular;  Laterality: N/A;  . TEE WITHOUT CARDIOVERSION N/A 01/20/2019   Procedure: TRANSESOPHAGEAL ECHOCARDIOGRAM (TEE);  Surgeon: Buford Dresser, MD;  Location: [facility] ENDOSCOPY;  Service: Cardiovascular;  Laterality: N/A;  . TONSILLECTOMY    . TOTAL HIP ARTHROPLASTY Left 12/18/2015   Procedure: LEFT TOTAL HIP ARTHROPLASTY ANTERIOR APPROACH;  Surgeon: Paralee Cancel, MD;  Location: [facility] ORS;  Service: Orthopedics;  Laterality: Left;    Current Outpatient Medications  Medication Sig Dispense Refill  . acetaminophen (TYLENOL) 500 MG tablet Take 1,000 mg by mouth at bedtime.     . ALPRAZolam (XANAX) 0.5 MG tablet Take 0.25-0.5 mg  by mouth at bedtime.     Marland Kitchen amiodarone (PACERONE) 200 MG tablet Take 1 tablet (200 mg total) by mouth daily. 90 tablet 3  . Biotin w/ Vitamins C & E (HAIR SKIN & NAILS GUMMIES PO) Take 1 capsule by mouth daily.    . busPIRone (BUSPAR) 5 MG tablet Take 5 mg by mouth 2 (two) times daily as needed (for stress/anxiety).     . carvedilol (COREG) 25 MG tablet Take 0.5 tablets (12.5 mg total) by mouth 2 (two) times daily. 90 tablet 3  . Cyanocobalamin (B-12) 2500 MCG TABS Take 625 mcg by mouth every other day.    . furosemide (LASIX) 40 MG tablet Take 60 mg by mouth every morning. 40 my in the am, 20 mg in the pm    . hydrALAZINE (APRESOLINE) 10 MG tablet Take 1 tablet (10 mg total) by mouth daily. 60 tablet 3  . isosorbide mononitrate (IMDUR) 30 MG 24 hr tablet Take 0.5 tablets (15 mg total) by mouth daily. 90 tablet 3  . Magnesium 400 MG CAPS Take 400 mg by mouth 3 (three) times a week.    . Multiple Vitamin (MULTIVITAMIN WITH MINERALS) TABS tablet Take 1 tablet by mouth daily.    Vladimir Faster Glycol-Propyl Glycol (SYSTANE OP) Place 1 drop into both eyes daily as needed (dry eyes).    . Potassium 99 MG TABS Take 99 mg by mouth daily.    Alveda Reasons 15 MG TABS tablet TAKE 1 TABLET(15 MG) BY MOUTH DAILY WITH SUPPER 30 tablet 6   No current facility-administered medications for this visit.        Review of Systems negative except from HPI and PMH  Physical Exam BP 124/68   Pulse 74   Ht 5' 4.5" (1.638 m)   Wt 158 lb (71.7 kg)   SpO2 94%   BMI 26.70 kg/m  Well developed and well nourished in no acute distress HENT normal Neck supple with JVP-flat Clear Device pocket well healed; without hematoma or erythema.  There is no tethering  Regular rate and rhythm, no  murmur Abd-soft with active BS No Clubbing cyanosis   edema Skin-warm and dry A & Oriented  Grossly normal sensory and motor function  ECG AV pacing QRS 277msec  Assessment and  Plan  Atrial fibrillation-persistent  Sinus  node dysfunction-profound   HFrEF  Chronic class 3  Pacemaker MRI Medtronic   renal failure grade 3 acute/chronic  MR moderate  1AVB-profound   High Risk Medication Surveillance amiodarone    Has reverted to atrial fibrillation.  Discussed to general strategies of trying to improve cardiovascular performance.  The first is to restore sinus rhythm  and the second is to upgrade her device to CRT given the profound change in left ventricular systolic function related temporally to right ventricular apical pacing.  Given the issues of anticoagulation, they will need to be done sequentially.  I have suggested that cardioversion would be the preferred first choice as sinus rhythm would make her more easily able to tolerate CRT procedure.  We would need about 3+ weeks or so following cardioversion prior to undertaking CRT.  Not sure whether she has orthostasis or not.  Vital signs today were suggestive but not confirming.  Amiodarone could be making this worse but this may explain some of her weakness that she walks.  Also had a lengthy discussion regarding end-of-life issues.  We discussed CODE STATUS, the limitations of her living will, the importance of preparing her dying in terms of relationships.  She will discuss these further with her family.

## 2020-04-30 NOTE — Patient Instructions (Addendum)
Medication Instructions:  Your physician recommends that you continue on your current medications as directed. Please refer to the Current Medication list given to you today.  Labwork: BMET and CBC for cardioversion  Testing/Procedures: Your physician has recommended that you have a Cardioversion (DCCV). Electrical Cardioversion uses a jolt of electricity to your heart either through paddles or wired patches attached to your chest. This is a controlled, usually prescheduled, procedure. Defibrillation is done under light anesthesia in the hospital, and you usually go home the day of the procedure. This is done to get your heart back into a normal rhythm. You are not awake for the procedure. Please see the instruction sheet given to you today.    Follow-Up: To be scheduled  Remote monitoring is used to monitor your Pacemaker of ICD from home. This monitoring reduces the number of office visits required to check your device to one time per year. It allows [place] to keep an eye on the functioning of your device to ensure it is working properly.   Any Other Special Instructions Will Be Listed Below (If Applicable).    If you need a refill on your cardiac medications before your next appointment, please call your pharmacy.

## 2020-05-01 ENCOUNTER — Other Ambulatory Visit: Payer: Self-pay | Admitting: Internal Medicine

## 2020-05-01 ENCOUNTER — Telehealth: Payer: Self-pay

## 2020-05-01 DIAGNOSIS — I4819 Other persistent atrial fibrillation: Secondary | ICD-10-CM

## 2020-05-01 NOTE — Telephone Encounter (Signed)
Spoke with pt and advised DCCV scheduled for Monday 05/07/2020 with arrival at 730am at [facility].  Covid testing is scheduled for Thursday, 05/03/2020 at 145pm.  Pt advised full instruction letter has been released to [place].  Call [phone] for any further questions.  Pt verbalizes understanding and agrees with current plan.

## 2020-05-03 ENCOUNTER — Telehealth: Payer: Medicare Other | Admitting: Internal Medicine

## 2020-05-03 ENCOUNTER — Telehealth: Payer: Self-pay | Admitting: Internal Medicine

## 2020-05-03 ENCOUNTER — Other Ambulatory Visit (HOSPITAL_COMMUNITY)
Admission: RE | Admit: 2020-05-03 | Discharge: 2020-05-03 | Disposition: A | Discharge status: Home / Self Care | Payer: Medicare Other | Source: Ambulatory Visit | Attending: Cardiovascular Disease | Admitting: Cardiovascular Disease

## 2020-05-03 DIAGNOSIS — Z20822 Contact with and (suspected) exposure to covid-19: Secondary | ICD-10-CM | POA: Insufficient documentation

## 2020-05-03 DIAGNOSIS — Z01812 Encounter for preprocedural laboratory examination: Secondary | ICD-10-CM | POA: Insufficient documentation

## 2020-05-03 DIAGNOSIS — R7989 Other specified abnormal findings of blood chemistry: Secondary | ICD-10-CM

## 2020-05-03 LAB — SARS CORONAVIRUS 2 (TAT 6-24 HRS): SARS Coronavirus 2: NEGATIVE

## 2020-05-03 NOTE — Telephone Encounter (Signed)
Patient states that her husband's aid did come over today. She is refusing to get vaccinated but was wearing her mask. She states she sent her home but just wanted to advise Dr. Caryl Comes and his nurse since she is to be in quarantine.

## 2020-05-03 NOTE — Telephone Encounter (Signed)
New Message  Patient is calling in stating that she knows that she is supposed to quarantine for the cardioversion that she is having; however, he husband is homebound and has aides that come in the home to assist with his care. Would the aides still be able to come if they all wear masks? Would this still count as being quarantine? Please give patient a call back to advise.

## 2020-05-03 NOTE — Telephone Encounter (Signed)
Spoke with pt and advised OK for her to be around those who she is normally around in her home and are masked if not vaccinated.  Pt will go for Covid testing testing this pm for her cardioversion. Pt verbalizes understanding and agrees with current plan.

## 2020-05-06 NOTE — Anesthesia Preprocedure Evaluation (Addendum)
Anesthesia Evaluation  Patient identified by MRN, date of birth, ID band Patient awake    Reviewed: Allergy & Precautions, NPO status , Patient's Chart, lab work & pertinent test results  Airway Mallampati: I  TM Distance: >3 FB Neck ROM: Full    Dental no notable dental hx. (+) Teeth Intact, Dental Advisory Given   Pulmonary neg pulmonary ROS,    Pulmonary exam normal breath sounds clear to auscultation       Cardiovascular hypertension, Pt. on medications Normal cardiovascular exam+ dysrhythmias Atrial Fibrillation + pacemaker  Rhythm:Irregular Rate:Normal  Afib V paced   Neuro/Psych Anxiety negative neurological ROS     GI/Hepatic Neg liver ROS, GERD  ,  Endo/Other    Renal/GU Renal InsufficiencyRenal disease     Musculoskeletal   Abdominal   Peds  Hematology   Anesthesia Other Findings   Reproductive/Obstetrics                            Anesthesia Physical Anesthesia Plan  ASA: III  Anesthesia Plan: General   Post-op Pain Management:    Induction: Intravenous  PONV Risk Score and Plan: Treatment may vary due to age or medical condition  Airway Management Planned: Nasal Cannula and Natural Airway  Additional Equipment:   Intra-op Plan:   Post-operative Plan:   Informed Consent: I have reviewed the patients History and Physical, chart, labs and discussed the procedure including the risks, benefits and alternatives for the proposed anesthesia with the patient or authorized representative who has indicated his/her understanding and acceptance.     Dental advisory given  Plan Discussed with: CRNA  Anesthesia Plan Comments:        Anesthesia Quick Evaluation

## 2020-05-07 ENCOUNTER — Ambulatory Visit (HOSPITAL_COMMUNITY): Payer: Medicare Other | Admitting: Anesthesiology

## 2020-05-07 ENCOUNTER — Ambulatory Visit (HOSPITAL_COMMUNITY)
Admission: RE | Admit: 2020-05-07 | Discharge: 2020-05-07 | Disposition: A | Discharge status: Home / Self Care | Payer: Medicare Other | Attending: Cardiovascular Disease | Admitting: Cardiovascular Disease

## 2020-05-07 ENCOUNTER — Other Ambulatory Visit: Payer: Self-pay

## 2020-05-07 ENCOUNTER — Encounter (HOSPITAL_COMMUNITY)
Admission: RE | Disposition: A | Discharge status: Home / Self Care | Payer: Self-pay | Source: Home / Self Care | Attending: Cardiovascular Disease

## 2020-05-07 DIAGNOSIS — I5022 Chronic systolic (congestive) heart failure: Secondary | ICD-10-CM | POA: Diagnosis not present

## 2020-05-07 DIAGNOSIS — N189 Chronic kidney disease, unspecified: Secondary | ICD-10-CM | POA: Insufficient documentation

## 2020-05-07 DIAGNOSIS — F419 Anxiety disorder, unspecified: Secondary | ICD-10-CM | POA: Insufficient documentation

## 2020-05-07 DIAGNOSIS — I495 Sick sinus syndrome: Secondary | ICD-10-CM | POA: Insufficient documentation

## 2020-05-07 DIAGNOSIS — E669 Obesity, unspecified: Secondary | ICD-10-CM | POA: Diagnosis not present

## 2020-05-07 DIAGNOSIS — Z950 Presence of cardiac pacemaker: Secondary | ICD-10-CM | POA: Insufficient documentation

## 2020-05-07 DIAGNOSIS — J9601 Acute respiratory failure with hypoxia: Secondary | ICD-10-CM | POA: Diagnosis not present

## 2020-05-07 DIAGNOSIS — Z6826 Body mass index (BMI) 26.0-26.9, adult: Secondary | ICD-10-CM | POA: Insufficient documentation

## 2020-05-07 DIAGNOSIS — Z7901 Long term (current) use of anticoagulants: Secondary | ICD-10-CM | POA: Diagnosis not present

## 2020-05-07 DIAGNOSIS — E785 Hyperlipidemia, unspecified: Secondary | ICD-10-CM | POA: Insufficient documentation

## 2020-05-07 DIAGNOSIS — K219 Gastro-esophageal reflux disease without esophagitis: Secondary | ICD-10-CM | POA: Insufficient documentation

## 2020-05-07 DIAGNOSIS — Z79899 Other long term (current) drug therapy: Secondary | ICD-10-CM | POA: Insufficient documentation

## 2020-05-07 DIAGNOSIS — I4819 Other persistent atrial fibrillation: Secondary | ICD-10-CM | POA: Diagnosis not present

## 2020-05-07 DIAGNOSIS — I130 Hypertensive heart and chronic kidney disease with heart failure and stage 1 through stage 4 chronic kidney disease, or unspecified chronic kidney disease: Secondary | ICD-10-CM | POA: Diagnosis not present

## 2020-05-07 HISTORY — PX: CARDIOVERSION: SHX1299

## 2020-05-07 LAB — POCT I-STAT, CHEM 8
BUN: 32 mg/dL — ABNORMAL HIGH (ref 8–23)
Calcium, Ion: 1.2 mmol/L (ref 1.15–1.40)
Chloride: 101 mmol/L (ref 98–111)
Creatinine, Ser: 1.8 mg/dL — ABNORMAL HIGH (ref 0.44–1.00)
Glucose, Bld: 99 mg/dL (ref 70–99)
HCT: 33 % — ABNORMAL LOW (ref 36.0–46.0)
Hemoglobin: 11.2 g/dL — ABNORMAL LOW (ref 12.0–15.0)
Potassium: 3.2 mmol/L — ABNORMAL LOW (ref 3.5–5.1)
Sodium: 141 mmol/L (ref 135–145)
TCO2: 26 mmol/L (ref 22–32)

## 2020-05-07 SURGERY — CARDIOVERSION
Anesthesia: General

## 2020-05-07 MED ORDER — LIDOCAINE 2% (20 MG/ML) 5 ML SYRINGE
INTRAMUSCULAR | Status: DC | PRN
  Administered 2020-05-07: 50 mg via INTRAVENOUS

## 2020-05-07 MED ORDER — PROPOFOL 10 MG/ML IV BOLUS
INTRAVENOUS | Status: DC | PRN
  Administered 2020-05-07: 50 mg via INTRAVENOUS

## 2020-05-07 MED ORDER — SODIUM CHLORIDE 0.9 % IV SOLN: INTRAVENOUS | Status: DC

## 2020-05-07 NOTE — Transfer of Care (Signed)
Immediate Anesthesia Transfer of Care Note  Patient: [name]  Procedure(s) Performed: CARDIOVERSION (N/A )  Patient Location: PACU and Endoscopy Unit  Anesthesia Type:General  Level of Consciousness: awake, drowsy and patient cooperative  Airway & Oxygen Therapy: Patient Spontanous Breathing and Patient connected to nasal cannula oxygen  Post-op Assessment: Report given to RN and Post -op Vital signs reviewed and stable  Post vital signs: Reviewed and stable  Last Vitals:  Vitals Value Taken Time  BP    Temp    Pulse    Resp    SpO2      Last Pain:  Vitals:   05/07/20 0809  TempSrc: Oral  PainSc: 0-No pain         Complications: No complications documented.

## 2020-05-07 NOTE — Anesthesia Postprocedure Evaluation (Signed)
Anesthesia Post Note  Patient: [name]  Procedure(s) Performed: CARDIOVERSION (N/A )     Patient location during evaluation: Endoscopy Anesthesia Type: General Level of consciousness: awake and alert Pain management: pain level controlled Vital Signs Assessment: post-procedure vital signs reviewed and stable Respiratory status: spontaneous breathing, nonlabored ventilation, respiratory function stable and patient connected to nasal cannula oxygen Cardiovascular status: blood pressure returned to baseline and stable Postop Assessment: no apparent nausea or vomiting Anesthetic complications: no   No complications documented.  Last Vitals:  Vitals:   05/07/20 0905 05/07/20 0915  BP:  (!) 132/57  Pulse:  (!) 59  Resp:  18  Temp: 36.5 C   SpO2: 95% 98%    Last Pain:  Vitals:   05/07/20 0915  TempSrc:   PainSc: 0-No pain                 Barnet Glasgow

## 2020-05-07 NOTE — Interval H&P Note (Signed)
History and Physical Interval Note:  05/07/2020 8:46 AM  [name]  has presented today for surgery, with the diagnosis of afib.  The various methods of treatment have been discussed with the patient and family. After consideration of risks, benefits and other options for treatment, the patient has consented to  Procedure(s): CARDIOVERSION (N/A) as a surgical intervention.  The patient's history has been reviewed, patient examined, no change in status, stable for surgery.  I have reviewed the patient's chart and labs.  Questions were answered to the patient's satisfaction.     Mertie Moores

## 2020-05-07 NOTE — Anesthesia Procedure Notes (Signed)
Procedure Name: MAC Date/Time: 05/07/2020 8:55 AM Performed by: Renato Shin, CRNA Pre-anesthesia Checklist: Patient identified, Emergency Drugs available, Suction available and Patient being monitored Patient Re-evaluated:Patient Re-evaluated prior to induction Oxygen Delivery Method: Ambu bag Preoxygenation: Pre-oxygenation with 100% oxygen Induction Type: IV induction Ventilation: Mask ventilation without difficulty Placement Confirmation: positive ETCO2 and breath sounds checked- equal and bilateral Dental Injury: Teeth and Oropharynx as per pre-operative assessment

## 2020-05-07 NOTE — CV Procedure (Signed)
    Cardioversion Note  [name] [id number] 08/11/1930  Procedure: DC Cardioversion Indications: atrial fib   Procedure Details Consent: Obtained Time Out: Verified patient identification, verified procedure, site/side was marked, verified correct patient position, special equipment/implants available, Radiology Safety Procedures followed,  medications/allergies/relevent history reviewed, required imaging and test results available.  Performed  The patient has been on adequate anticoagulation.  The patient received IV  Lidocaine 60 mg IV followed by Propofol 50 mg IV  for sedation.  Synchronous cardioversion was performed at 120  joules.  The cardioversion was successful.     Complications: No apparent complications Patient did tolerate procedure well.   [name], [name]., MD, [facility] 05/07/2020, 9:20 AM

## 2020-05-07 NOTE — Discharge Instructions (Signed)
Electrical Cardioversion Electrical cardioversion is the delivery of a jolt of electricity to restore a normal rhythm to the heart. A rhythm that is too fast or is not regular keeps the heart from pumping well. In this procedure, sticky patches or metal paddles are placed on the chest to deliver electricity to the heart from a device. This procedure may be done in an emergency if:  There is low or no blood pressure as a result of the heart rhythm.  Normal rhythm must be restored as fast as possible to protect the brain and heart from further damage.  It may save a life. This may also be a scheduled procedure for irregular or fast heart rhythms that are not immediately life-threatening. Tell a health care provider about:  Any allergies you have.  All medicines you are taking, including vitamins, herbs, eye drops, creams, and over-the-counter medicines.  Any problems you or family members have had with anesthetic medicines.  Any blood disorders you have.  Any surgeries you have had.  Any medical conditions you have.  Whether you are pregnant or may be pregnant. What are the risks? Generally, this is a safe procedure. However, problems may occur, including:  Allergic reactions to medicines.  A blood clot that breaks free and travels to other parts of your body.  The possible return of an abnormal heart rhythm within hours or days after the procedure.  Your heart stopping (cardiac arrest). This is rare. What happens before the procedure? Medicines  Your health care provider may have you start taking: ? Blood-thinning medicines (anticoagulants) so your blood does not clot as easily. ? Medicines to help stabilize your heart rate and rhythm.  Ask your health care provider about: ? Changing or stopping your regular medicines. This is especially important if you are taking diabetes medicines or blood thinners. ? Taking medicines such as aspirin and ibuprofen. These medicines can  thin your blood. Do not take these medicines unless your health care provider tells you to take them. ? Taking over-the-counter medicines, vitamins, herbs, and supplements. General instructions  Follow instructions from your health care provider about eating or drinking restrictions.  Plan to have someone take you home from the hospital or clinic.  If you will be going home right after the procedure, plan to have someone with you for 24 hours.  Ask your health care provider what steps will be taken to help prevent infection. These may include washing your skin with a germ-killing soap. What happens during the procedure?   An IV will be inserted into one of your veins.  Sticky patches (electrodes) or metal paddles may be placed on your chest.  You will be given a medicine to help you relax (sedative).  An electrical shock will be delivered. The procedure may vary among health care providers and hospitals. What can I expect after the procedure?  Your blood pressure, heart rate, breathing rate, and blood oxygen level will be monitored until you leave the hospital or clinic.  Your heart rhythm will be watched to make sure it does not change.  You may have some redness on the skin where the shocks were given. Follow these instructions at home:  Do not drive for 24 hours if you were given a sedative during your procedure.  Take over-the-counter and prescription medicines only as told by your health care provider.  Ask your health care provider how to check your pulse. Check it often.  Rest for 48 hours after the procedure or   as told by your health care provider.  Avoid or limit your caffeine use as told by your health care provider.  Keep all follow-up visits as told by your health care provider. This is important. Contact a health care provider if:  You feel like your heart is beating too quickly or your pulse is not regular.  You have a serious muscle cramp that does not go  away. Get help right away if:  You have discomfort in your chest.  You are dizzy or you feel faint.  You have trouble breathing or you are short of breath.  Your speech is slurred.  You have trouble moving an arm or leg on one side of your body.  Your fingers or toes turn cold or blue. Summary  Electrical cardioversion is the delivery of a jolt of electricity to restore a normal rhythm to the heart.  This procedure may be done right away in an emergency or may be a scheduled procedure if the condition is not an emergency.  Generally, this is a safe procedure.  After the procedure, check your pulse often as told by your health care provider. This information is not intended to replace advice given to you by your health care provider. Make sure you discuss any questions you have with your health care provider. Document Revised: 05/30/2019 Document Reviewed: 05/30/2019 Elsevier Patient Education  2020 [employer]  

## 2020-05-09 ENCOUNTER — Encounter (HOSPITAL_COMMUNITY): Payer: Self-pay | Admitting: Cardiovascular Disease

## 2020-05-10 DIAGNOSIS — I340 Nonrheumatic mitral (valve) insufficiency: Secondary | ICD-10-CM

## 2020-05-10 DIAGNOSIS — I071 Rheumatic tricuspid insufficiency: Secondary | ICD-10-CM

## 2020-05-10 HISTORY — DX: Rheumatic tricuspid insufficiency: I07.1

## 2020-05-10 HISTORY — DX: Nonrheumatic mitral (valve) insufficiency: I34.0

## 2020-05-16 LAB — CUP PACEART INCLINIC DEVICE CHECK
Battery Remaining Longevity: 66 mo
Battery Voltage: 2.96 V
Brady Statistic AP VP Percent: 98.88 %
Brady Statistic AP VS Percent: 0.03 %
Brady Statistic AS VP Percent: 0.84 %
Brady Statistic AS VS Percent: 0.25 %
Brady Statistic RA Percent Paced: 44.93 %
Brady Statistic RV Percent Paced: 99.67 %
Date Time Interrogation Session: 20210621172211
Implantable Lead Implant Date: 20180226
Implantable Lead Implant Date: 20180226
Implantable Lead Location: 753859
Implantable Lead Location: 753860
Implantable Lead Model: 5076
Implantable Lead Model: 5076
Implantable Pulse Generator Implant Date: 20180226
Lead Channel Impedance Value: 285 Ohm
Lead Channel Impedance Value: 342 Ohm
Lead Channel Impedance Value: 418 Ohm
Lead Channel Impedance Value: 475 Ohm
Lead Channel Pacing Threshold Amplitude: 0.75 V
Lead Channel Pacing Threshold Amplitude: 1.25 V
Lead Channel Pacing Threshold Pulse Width: 0.4 ms
Lead Channel Pacing Threshold Pulse Width: 0.4 ms
Lead Channel Sensing Intrinsic Amplitude: 0.75 mV
Lead Channel Sensing Intrinsic Amplitude: 1 mV
Lead Channel Sensing Intrinsic Amplitude: 12.5 mV
Lead Channel Sensing Intrinsic Amplitude: 7.875 mV
Lead Channel Setting Pacing Amplitude: 2 V
Lead Channel Setting Pacing Amplitude: 2.5 V
Lead Channel Setting Pacing Pulse Width: 0.4 ms
Lead Channel Setting Sensing Sensitivity: 2.8 mV

## 2020-05-18 ENCOUNTER — Telehealth: Payer: Self-pay | Admitting: Internal Medicine

## 2020-05-18 NOTE — Telephone Encounter (Signed)
Spoke with pt who states she feels she had a mild reaction to the general anesthesia from her DCCV.  Pt states she developed a dry hacky cough with burning sensation as anesthesia was being administered and cough continued for a day or so after procedure.  Pt reports cough has subsided.  Pt advised general anesthesia is not typically used for device upgrade which is scheduled for 05/29/2020.  Pt verbalized understanding and thanked Therapist, sports for call.

## 2020-05-18 NOTE — Telephone Encounter (Signed)
   Pt is concerned about the type of anesthesia she will have on her upcoming procedure. She said last 06/28 she reacted with the anesthesia used on her   Please call

## 2020-05-25 ENCOUNTER — Other Ambulatory Visit: Payer: Self-pay

## 2020-05-25 ENCOUNTER — Other Ambulatory Visit: Payer: Medicare Other | Admitting: *Deleted

## 2020-05-25 ENCOUNTER — Other Ambulatory Visit: Payer: Self-pay | Admitting: Internal Medicine

## 2020-05-25 DIAGNOSIS — Z01812 Encounter for preprocedural laboratory examination: Secondary | ICD-10-CM

## 2020-05-25 DIAGNOSIS — Z950 Presence of cardiac pacemaker: Secondary | ICD-10-CM

## 2020-05-25 DIAGNOSIS — R7989 Other specified abnormal findings of blood chemistry: Secondary | ICD-10-CM | POA: Diagnosis not present

## 2020-05-25 LAB — BASIC METABOLIC PANEL
BUN/Creatinine Ratio: 19 (ref 12–28)
BUN: 32 mg/dL — ABNORMAL HIGH (ref 8–27)
CO2: 26 mmol/L (ref 20–29)
Calcium: 9.2 mg/dL (ref 8.7–10.3)
Chloride: 100 mmol/L (ref 96–106)
Creatinine, Ser: 1.7 mg/dL — ABNORMAL HIGH (ref 0.57–1.00)
GFR calc Af Amer: 31 mL/min/{1.73_m2} — ABNORMAL LOW (ref >59)
GFR calc non Af Amer: 27 mL/min/{1.73_m2} — ABNORMAL LOW (ref >59)
Glucose: 141 mg/dL — ABNORMAL HIGH (ref 65–99)
Potassium: 3.6 mmol/L (ref 3.5–5.2)
Sodium: 141 mmol/L (ref 134–144)

## 2020-05-25 LAB — CBC
Hematocrit: 35 % (ref 34.0–46.6)
Hemoglobin: 11.4 g/dL (ref 11.1–15.9)
MCH: 29.7 pg (ref 26.6–33.0)
MCHC: 32.6 g/dL (ref 31.5–35.7)
MCV: 91 fL (ref 79–97)
Platelets: 201 10*3/uL (ref 150–450)
RBC: 3.84 x10E6/uL (ref 3.77–5.28)
RDW: 14.1 % (ref 11.7–15.4)
WBC: 5.1 10*3/uL (ref 3.4–10.8)

## 2020-05-30 ENCOUNTER — Ambulatory Visit (HOSPITAL_COMMUNITY)
Admission: RE | Disposition: A | Discharge status: Home / Self Care | Payer: Self-pay | Source: Home / Self Care | Attending: Internal Medicine

## 2020-05-30 ENCOUNTER — Ambulatory Visit (HOSPITAL_COMMUNITY): Payer: Medicare Other

## 2020-05-30 ENCOUNTER — Other Ambulatory Visit: Payer: Self-pay

## 2020-05-30 ENCOUNTER — Ambulatory Visit (HOSPITAL_COMMUNITY)
Admission: RE | Admit: 2020-05-30 | Discharge: 2020-05-30 | Disposition: A | Discharge status: Home / Self Care | Payer: Medicare Other | Attending: Internal Medicine | Admitting: Internal Medicine

## 2020-05-30 DIAGNOSIS — I130 Hypertensive heart and chronic kidney disease with heart failure and stage 1 through stage 4 chronic kidney disease, or unspecified chronic kidney disease: Secondary | ICD-10-CM | POA: Insufficient documentation

## 2020-05-30 DIAGNOSIS — Z79899 Other long term (current) drug therapy: Secondary | ICD-10-CM | POA: Insufficient documentation

## 2020-05-30 DIAGNOSIS — I5022 Chronic systolic (congestive) heart failure: Secondary | ICD-10-CM | POA: Insufficient documentation

## 2020-05-30 DIAGNOSIS — Z959 Presence of cardiac and vascular implant and graft, unspecified: Secondary | ICD-10-CM

## 2020-05-30 DIAGNOSIS — I4819 Other persistent atrial fibrillation: Secondary | ICD-10-CM | POA: Insufficient documentation

## 2020-05-30 DIAGNOSIS — I442 Atrioventricular block, complete: Secondary | ICD-10-CM

## 2020-05-30 DIAGNOSIS — I455 Other specified heart block: Secondary | ICD-10-CM | POA: Diagnosis not present

## 2020-05-30 DIAGNOSIS — K219 Gastro-esophageal reflux disease without esophagitis: Secondary | ICD-10-CM | POA: Insufficient documentation

## 2020-05-30 DIAGNOSIS — J90 Pleural effusion, not elsewhere classified: Secondary | ICD-10-CM | POA: Diagnosis not present

## 2020-05-30 DIAGNOSIS — I495 Sick sinus syndrome: Secondary | ICD-10-CM | POA: Insufficient documentation

## 2020-05-30 DIAGNOSIS — E785 Hyperlipidemia, unspecified: Secondary | ICD-10-CM | POA: Insufficient documentation

## 2020-05-30 DIAGNOSIS — E669 Obesity, unspecified: Secondary | ICD-10-CM | POA: Insufficient documentation

## 2020-05-30 DIAGNOSIS — Z6823 Body mass index (BMI) 23.0-23.9, adult: Secondary | ICD-10-CM | POA: Insufficient documentation

## 2020-05-30 DIAGNOSIS — F419 Anxiety disorder, unspecified: Secondary | ICD-10-CM | POA: Diagnosis not present

## 2020-05-30 DIAGNOSIS — Z950 Presence of cardiac pacemaker: Secondary | ICD-10-CM | POA: Insufficient documentation

## 2020-05-30 DIAGNOSIS — Z7901 Long term (current) use of anticoagulants: Secondary | ICD-10-CM | POA: Insufficient documentation

## 2020-05-30 DIAGNOSIS — N183 Chronic kidney disease, stage 3 unspecified: Secondary | ICD-10-CM | POA: Diagnosis not present

## 2020-05-30 HISTORY — PX: BIV UPGRADE: EP1202

## 2020-05-30 SURGERY — BIV UPGRADE

## 2020-05-30 MED ORDER — MIDAZOLAM HCL 5 MG/5ML IJ SOLN
INTRAMUSCULAR | Status: AC
  Filled 2020-05-30: qty 5

## 2020-05-30 MED ORDER — SODIUM CHLORIDE 0.9 % IV SOLN: INTRAVENOUS | Status: DC

## 2020-05-30 MED ORDER — SODIUM CHLORIDE 0.9 % IV SOLN
INTRAVENOUS | Status: AC
  Filled 2020-05-30: qty 2

## 2020-05-30 MED ORDER — MIDAZOLAM HCL 5 MG/5ML IJ SOLN
INTRAMUSCULAR | Status: DC | PRN
  Administered 2020-05-30: 0.5 mg via INTRAVENOUS
  Administered 2020-05-30: 1.5 mg via INTRAVENOUS
  Administered 2020-05-30 (×2): 0.5 mg via INTRAVENOUS

## 2020-05-30 MED ORDER — FENTANYL CITRATE (PF) 100 MCG/2ML IJ SOLN
INTRAMUSCULAR | Status: DC | PRN
  Administered 2020-05-30 (×2): 12.5 ug via INTRAVENOUS

## 2020-05-30 MED ORDER — CEFAZOLIN SODIUM-DEXTROSE 2-4 GM/100ML-% IV SOLN
INTRAVENOUS | Status: AC
  Filled 2020-05-30: qty 100

## 2020-05-30 MED ORDER — HEPARIN (PORCINE) IN NACL 1000-0.9 UT/500ML-% IV SOLN
INTRAVENOUS | Status: DC | PRN
  Administered 2020-05-30: 500 mL

## 2020-05-30 MED ORDER — ONDANSETRON HCL 4 MG/2ML IJ SOLN: 4.0000 mg | Freq: Four times a day (QID) | INTRAMUSCULAR | Status: DC | PRN

## 2020-05-30 MED ORDER — CEFAZOLIN SODIUM-DEXTROSE 2-4 GM/100ML-% IV SOLN
2.0000 g | INTRAVENOUS | Status: AC
  Administered 2020-05-30: 2 g via INTRAVENOUS

## 2020-05-30 MED ORDER — HEPARIN (PORCINE) IN NACL 1000-0.9 UT/500ML-% IV SOLN
INTRAVENOUS | Status: AC
  Filled 2020-05-30: qty 500

## 2020-05-30 MED ORDER — SODIUM CHLORIDE 0.9 % IV SOLN
80.0000 mg | INTRAVENOUS | Status: AC
  Administered 2020-05-30: 80 mg

## 2020-05-30 MED ORDER — LIDOCAINE HCL (PF) 1 % IJ SOLN
INTRAMUSCULAR | Status: DC | PRN
  Administered 2020-05-30: 55 mL

## 2020-05-30 MED ORDER — LIDOCAINE HCL 1 % IJ SOLN
INTRAMUSCULAR | Status: AC
  Filled 2020-05-30: qty 60

## 2020-05-30 MED ORDER — ACETAMINOPHEN 325 MG PO TABS
325.0000 mg | ORAL_TABLET | ORAL | Status: DC | PRN
  Filled 2020-05-30: qty 2

## 2020-05-30 MED ORDER — FENTANYL CITRATE (PF) 100 MCG/2ML IJ SOLN
INTRAMUSCULAR | Status: AC
  Filled 2020-05-30: qty 2

## 2020-05-30 SURGICAL SUPPLY — 19 items
ADAPTER SEALING SSA-EW-09 (MISCELLANEOUS) ×2 IMPLANT
ADPR INTRO LNG 9FR SL XTD WNG (MISCELLANEOUS) ×1
BALLN ATTAIN 80 (BALLOONS) ×2
BALLN ATTAIN 80CM 6215 (BALLOONS) ×1
BALLOON ATTAIN 80 (BALLOONS) IMPLANT
CABLE SURGICAL S-101-97-12 (CABLE) ×3 IMPLANT
CATH ATTAIN COM SURV 6250V-MB2 (CATHETERS) ×2 IMPLANT
DEVICE CRTP PERCEPTA QUAD MRI (Pacemaker) ×2 IMPLANT
HEMOSTAT SURGICEL 2X4 FIBR (HEMOSTASIS) ×4 IMPLANT
KIT ESSENTIALS PG (KITS) ×2 IMPLANT
KIT MICROPUNCTURE NIT STIFF (SHEATH) ×2 IMPLANT
LEAD QUARTET 1458Q-86CM (Lead) ×2 IMPLANT
PAD PRO RADIOLUCENT 2001M-C (PAD) ×3 IMPLANT
SHEATH 9FR PRELUDE SNAP 13 (SHEATH) ×2 IMPLANT
SLITTER 6232ADJ (MISCELLANEOUS) ×2 IMPLANT
TOOL TRANSVALV INSERT TVI-07 (MISCELLANEOUS) ×2 IMPLANT
TRAY PACEMAKER INSERTION (PACKS) ×3 IMPLANT
WIRE ACUITY WHISPER EDS 4648 (WIRE) ×2 IMPLANT
WIRE HI TORQ VERSACORE-J 145CM (WIRE) ×2 IMPLANT

## 2020-05-30 NOTE — Interval H&P Note (Signed)
History and Physical Interval Note:  05/30/2020 7:34 AM  [name]  has presented today for surgery, with the diagnosis of cardiomyopathy.  The various methods of treatment have been discussed with the patient and family. After consideration of risks, benefits and other options for treatment, the patient has consented to  Procedure(s): BIV PPM UPGRADE (N/A) as a surgical intervention.  The patient's history has been reviewed, patient examined, no change in status, stable for surgery.  I have reviewed the patient's chart and labs.  Questions were answered to the patient's satisfaction.     Virl Axe  Some irregularity following cardioversion, but better the last week, with less breathlessness  The benefits and risks were reviewed including but not limited to death,  perforation, infection, lead dislodgement and device malfunction.  The patient understands agrees and is willing to proceed.

## 2020-05-30 NOTE — Discharge Instructions (Signed)
Dermabond will come off in next 10-14 days; if not will remove at wound check  Keep wound dry until tomorrow evening  No Driving x 4 days  Wound check in office as scheduled  Biventricular Pacemaker Implantation, Care After This sheet gives you information about how to care for yourself after your procedure. Your health care provider may also give you more specific instructions. If you have problems or questions, contact your health care provider. What can I expect after the procedure? After the procedure, it is common to have:  Mild pain or soreness in your chest for several days.  A small amount of blood or clear fluid coming from your incision.  A slight bump in your chest where the pulse generator was placed. You may be able to feel the generator under your skin. This is normal. Follow these instructions at home: Medicines  Take over-the-counter and prescription medicines only as told by your health care provider.  Do not take any new medicines without asking your health care provider first.  If you were prescribed an antibiotic medicine, take it as told by your health care provider. Do not stop taking the antibiotic even if you start to feel better.  Ask your health care provider if the medicine prescribed to you requires you to avoid driving or using heavy machinery. Incision care      Keep your incision area clean and dry.  Avoid rubbing the incision and the area around the incision.  Follow instructions from your health care provider about how to take care of your incision. Make sure you: ? Wash your hands with soap and water before and after you change your bandage (dressing). If soap and water are not available, use hand sanitizer. ? Change your dressing as told by your health care provider. ? Leave stitches (sutures), skin glue, or adhesive strips in place. These skin closures may need to stay in place for 2 weeks or longer. If adhesive strip edges start to loosen and  curl up, you may trim the loose edges. Do not remove adhesive strips completely unless your health care provider tells you to do that.  Check your incision area every day for signs of infection. Check for: ? More redness, swelling, or pain. ? More fluid or blood. ? Warmth. ? Pus or a bad smell. Activity  Return to your normal activities as told by your health care provider. Ask your health care provider what activities are safe for you.  Do not lift anything that is heavier than 10 lb (4.5 kg), or the limit that you are told, until your health care provider says that it is safe.  Do not lift your upper arms above your shoulders for at least 6 weeks or as long as told by your health care provider. ? If you tend to sleep with your arms above your head, wear an arm restraint while you sleep to prevent this from happening. ? Avoid sudden movements that pull your upper arms far away from your body for at least 6 weeks.  Do a mild form of exercise at least once a day, such as walking. As you feel better, you may exercise more.  Gently stretch your shoulders at least once a day to help prevent stiffness in your chest.  Rest as told by your health care provider.  Avoid sitting for a long time without moving. Get up to take short walks every 1-2 hours. This is important to improve blood flow and breathing. Ask for  help if you feel weak or unsteady. Electricity and magnetic fields  Avoid places and objects that have a strong electric or magnetic field. This includes: ? Airport Data processing manager. When you are at the airport, tell officials that you have a pacemaker and show them your pacemaker identification card. Officials will check you in safely so that your device is not damaged. Do not allow magnetic wands to be waved near your pacemaker. That can make the pacemaker stop working. ? Metal detectors. If you must pass through a metal detector, walk through it quickly. Do not stop under the  detector or stand near it. ? Power plants. ? Large electrical generators. ? Radiofrequency transmission towers, such as mobile phone and radio towers.  Do not use amateur Chief of Staff. If you are not sure whether something is safe to use, ask your health care provider. ? Some devices may be safe to use if you hold them at least 1 ft (0.3 m) from your pacemaker. These devices may include power tools, lawn mowers, and speakers.  When you talk on your mobile phone, hold it to your ear that is opposite from the side that your pacemaker is on. Do not leave your mobile phone in a pocket over your pacemaker. Long-term care  Carry your pacemaker identification card with you at all times, especially when you travel.  Consider wearing a medical alert bracelet or necklace that explains your pacemaker and any heart conditions you have.  Tell all health care providers who care for you that you have a pacemaker. This may prevent you from having an MRI because of the strong magnets used during that test.  Have your pacemaker checked every 3-6 months or as often as told by your health care provider. General instructions  Do not use any products that contain nicotine or tobacco, such as cigarettes, e-cigarettes, and chewing tobacco. These can delay incision healing after surgery. If you need help quitting, ask your health care provider.  Do not take baths, swim, or use a hot tub until your health care provider approves.  Do not wear tight clothing that could irritate the skin over your implant.  Follow instructions from your health care provider about eating or drinking restrictions.  Weigh yourself every day and write down your weight.  Keep all follow-up visits as told by your health care provider. This is important. Contact a health care provider if:  You gain 3 lb (1.4 kg) or more in 24 hours.  You have swelling in your feet, ankles, or legs.  You have an  irregular heartbeat (palpitations).  You have more redness, swelling, or pain around your incision.  You have more fluid or blood coming from your incision.  Your incision area feels warm to the touch.  You have pus or a bad smell coming from your incision. Get help right away if you:  Have chest pain.  Have a heart rate that drops below the lowest rate set for your pacemaker.  Have difficulty breathing.  Suddenly feel light-headed.  Have a fever.  Faint. These symptoms may represent a serious problem that is an emergency. Do not wait to see if the symptoms will go away. Get medical help right away. Call your local emergency services (911 in the [place] Do not drive yourself to the hospital. Summary  After the procedure, it is common to have mild pain or soreness in your chest for several days.  Take over-the-counter and prescription medicines only as  told by your health care provider.  Ask your health care provider what activities are safe for you.  Carry your pacemaker identification card with you at all times, especially when you travel. This information is not intended to replace advice given to you by your health care provider. Make sure you discuss any questions you have with your health care provider. Document Revised: 09/27/2018 Document Reviewed: 09/27/2018 Elsevier Patient Education  2020 [employer].

## 2020-05-31 ENCOUNTER — Encounter (HOSPITAL_COMMUNITY): Payer: Self-pay | Admitting: Internal Medicine

## 2020-05-31 MED FILL — Lidocaine HCl Local Inj 1%: INTRAMUSCULAR | Qty: 60 | Status: AC

## 2020-06-12 ENCOUNTER — Other Ambulatory Visit: Payer: Self-pay

## 2020-06-12 ENCOUNTER — Ambulatory Visit (INDEPENDENT_AMBULATORY_CARE_PROVIDER_SITE_OTHER): Payer: Medicare Other | Admitting: Emergency Medicine

## 2020-06-12 DIAGNOSIS — I495 Sick sinus syndrome: Secondary | ICD-10-CM | POA: Diagnosis not present

## 2020-06-12 DIAGNOSIS — Z950 Presence of cardiac pacemaker: Secondary | ICD-10-CM

## 2020-06-12 DIAGNOSIS — I442 Atrioventricular block, complete: Secondary | ICD-10-CM

## 2020-06-12 LAB — CUP PACEART INCLINIC DEVICE CHECK
Battery Remaining Longevity: 71 mo
Battery Voltage: 3.17 V
Brady Statistic AP VP Percent: 92.55 %
Brady Statistic AP VS Percent: 0.03 %
Brady Statistic AS VP Percent: 7.15 %
Brady Statistic AS VS Percent: 0.27 %
Brady Statistic RA Percent Paced: 92.76 %
Brady Statistic RV Percent Paced: 99.69 %
Date Time Interrogation Session: 20210803181355
Implantable Lead Implant Date: 20180226
Implantable Lead Implant Date: 20180226
Implantable Lead Implant Date: 20210721
Implantable Lead Location: 753858
Implantable Lead Location: 753859
Implantable Lead Location: 753860
Implantable Lead Model: 5076
Implantable Lead Model: 5076
Implantable Pulse Generator Implant Date: 20210721
Lead Channel Impedance Value: 1026 Ohm
Lead Channel Impedance Value: 1045 Ohm
Lead Channel Impedance Value: 1121 Ohm
Lead Channel Impedance Value: 1140 Ohm
Lead Channel Impedance Value: 1254 Ohm
Lead Channel Impedance Value: 304 Ohm
Lead Channel Impedance Value: 380 Ohm
Lead Channel Impedance Value: 437 Ohm
Lead Channel Impedance Value: 475 Ohm
Lead Channel Impedance Value: 475 Ohm
Lead Channel Impedance Value: 608 Ohm
Lead Channel Impedance Value: 665 Ohm
Lead Channel Impedance Value: 703 Ohm
Lead Channel Impedance Value: 950 Ohm
Lead Channel Pacing Threshold Amplitude: 0.75 V
Lead Channel Pacing Threshold Amplitude: 1.25 V
Lead Channel Pacing Threshold Amplitude: 2.25 V
Lead Channel Pacing Threshold Pulse Width: 0.4 ms
Lead Channel Pacing Threshold Pulse Width: 0.6 ms
Lead Channel Pacing Threshold Pulse Width: 0.8 ms
Lead Channel Sensing Intrinsic Amplitude: 1.5 mV
Lead Channel Sensing Intrinsic Amplitude: 13.375 mV
Lead Channel Setting Pacing Amplitude: 2.5 V
Lead Channel Setting Pacing Amplitude: 3.5 V
Lead Channel Setting Pacing Amplitude: 3.75 V
Lead Channel Setting Pacing Pulse Width: 0.4 ms
Lead Channel Setting Pacing Pulse Width: 0.8 ms
Lead Channel Setting Sensing Sensitivity: 0.9 mV

## 2020-06-12 NOTE — Progress Notes (Signed)
Wound check appointment, S/P BiV upgrade. Dermabond removed. Moderate to large hematoma noted at [facility] pocket, firm to palpation. S/P DCCV 05/07/20, patient states she was instructed to continue Xarelto without interruption. Incision edges approximated and well healed. While discussing plan to have Dr. Curt Bears (DOD) assess hematoma, patient and daughter adamant that patient see a different MD if option exists. Explained importance of MD assessment of hematoma. Discussed options with Janan Halter, RN Clinical Supervisor, and patient. Patient and daughter agreeable to DC appointment on 06/13/20 so that Dr. Lovena Le can assess hematoma.   Normal device function. Thresholds, sensing, and impedances consistent with implant measurements. RA (chronic lead) output increased to 2.5V @ 0.55ms for appropriate safety margin. RV and LV outputs at 3.5V with auto capture programmed on for extra safety margin until 3 month visit. Histogram distribution appropriate for patient and level of activity. BiV pacing 99.7%. No mode switches or high ventricular rates noted. RA lead monitor reprogrammed to adaptive, RA/RV/LV max impedance reduced to 2000ohms. Patient educated about wound care, arm mobility, lifting restrictions, and Carelink monitor. Carelink on 06/13/20 and ROV with Dr. Caryl Comes on 06/19/20.

## 2020-06-13 ENCOUNTER — Telehealth: Payer: Self-pay

## 2020-06-13 ENCOUNTER — Ambulatory Visit (INDEPENDENT_AMBULATORY_CARE_PROVIDER_SITE_OTHER): Payer: Medicare Other | Admitting: Emergency Medicine

## 2020-06-13 DIAGNOSIS — Z950 Presence of cardiac pacemaker: Secondary | ICD-10-CM

## 2020-06-13 NOTE — Progress Notes (Signed)
Pt in office for Hematoma assessment with Dr. Lovena Le.  Hematoma over Left sided device measures roughly 13cm in diameter.  Area is slightly soft, non tender and bruised.  Dr. Lovena Le assess pt, recommended holding Xarelto and reviewed stroke risk with pt. And her daughter who was also present.  Pt requested to know s/s of stroke to monitor for.  Information provided from American Stroke Association website.  Pt is scheduled for f/u visit with Dr. Caryl Comes on 06/19/20, pt v/u to keep appt and notify MD of any adverse changes in meantime.

## 2020-06-13 NOTE — Patient Instructions (Addendum)
Dr. Lovena Le recommends to stop taking Xarelto today.   Monitor for any signs of bleeding- if a spott of blood occurs, report to ED for evaluation.    Monitor for signs and symptoms of Stroke:   Face Drooping Does one side of the face droop or is it numb? Ask the person to smile. Is the person's smile uneven or lopsided?   Arm Weakness Is one arm weak or numb? Ask the person to raise both arms. Does one arm drift downward?   Speech Is speech slurred? Is the person unable to speak or hard to understand? Ask the person to repeat a simple sentence.  Additional symptoms may include:  Sudden Numbness- Sudden NUMBNESS or weakness of face, arm, or leg, especially on one side of the body Sudden Confusion- Sudden CONFUSION, trouble speaking or understanding speech Sudden Trouble Seeing- Sudden TROUBLE SEEING in one or both eyes Sudden Trouble Walking- Sudden TROUBLE WALKING, dizziness, loss of balance or coordination Sudden Severe Headache- Sudden SEVERE HEADACHE with no known cause   The Hematoma measured 13 cm across at armpit level.  Once Xarelto is out of the body (48 hours after stopping), notify the clinic if size continues to increase.

## 2020-06-13 NOTE — Telephone Encounter (Signed)
The pt daughter states the pt monitor is being replaced by [employer]. She should receive the new monitor in 4 days.  She wanted to know if her mother could get a monitor sooner than 4 days. I told her if they told her 4 days they put a rush on it because it normally take 7-10 business days to get the new monitor.

## 2020-06-19 ENCOUNTER — Ambulatory Visit: Payer: Medicare Other | Admitting: Internal Medicine

## 2020-06-19 ENCOUNTER — Encounter: Payer: Medicare Other | Admitting: Internal Medicine

## 2020-06-19 ENCOUNTER — Encounter: Payer: Self-pay | Admitting: Internal Medicine

## 2020-06-19 ENCOUNTER — Other Ambulatory Visit: Payer: Self-pay

## 2020-06-19 VITALS — BP 112/86 | HR 103 | Ht 64.5 in | Wt 141.0 lb

## 2020-06-19 DIAGNOSIS — Z950 Presence of cardiac pacemaker: Secondary | ICD-10-CM | POA: Diagnosis not present

## 2020-06-19 DIAGNOSIS — I4819 Other persistent atrial fibrillation: Secondary | ICD-10-CM

## 2020-06-19 DIAGNOSIS — I429 Cardiomyopathy, unspecified: Secondary | ICD-10-CM

## 2020-06-19 DIAGNOSIS — I495 Sick sinus syndrome: Secondary | ICD-10-CM | POA: Diagnosis not present

## 2020-06-19 NOTE — Patient Instructions (Signed)
Medication Instructions:  Your physician has recommended you make the following change in your medication:   ** Stop Hydralazine  *If you need a refill on your cardiac medications before your next appointment, please call your pharmacy*   Lab Work: None ordered.  If you have labs (blood work) drawn today and your tests are completely normal, you will receive your results only by: [facility] MyChart Message (if you have MyChart) OR . A paper copy in the mail If you have any lab test that is abnormal or we need to change your treatment, we will call you to review the results.   Testing/Procedures: Your physician has requested that you have an echocardiogram. Echocardiography is a painless test that uses sound waves to create images of your heart. It provides your doctor with information about the size and shape of your heart and how well your heart's chambers and valves are working. This procedure takes approximately one hour. There are no restrictions for this procedure.     Follow-Up: At [facility], you and your health needs are our priority.  As part of our continuing mission to provide you with exceptional heart care, we have created designated Provider Care Teams.  These Care Teams include your primary Cardiologist (physician) and Advanced Practice Providers (APPs -  Physician Assistants and Nurse Practitioners) who all work together to provide you with the care you need, when you need it.  We recommend signing up for the patient portal called "MyChart".  Sign up information is provided on this After Visit Summary.  MyChart is used to connect with patients for Virtual Visits (Telemedicine).  Patients are able to view lab/test results, encounter notes, upcoming appointments, etc.  Non-urgent messages can be sent to your provider as well.   To learn more about what you can do with MyChart, go to [email or web address].    Your next appointment:   Wound Check with Georgeanna Harrison PA-C 06/29/2020  at 930am  Follow up with Dr Caryl Comes 08/07/2020 at 245pm

## 2020-06-19 NOTE — Progress Notes (Signed)
Good night     Patient Care Team: Chesley Noon, MD as PCP - General (Family Medicine) Deboraha Sprang, MD as PCP - Cardiology (Cardiology)   HPI  [name] is a 86 y.o. female Seen for pacemaker follow-up with MR  She underwent device implantation by Dr. Carlyn Reichert 2/18 for symptomatic sinus node dysfunction and high-grade heart block resulting in nearly 100% ventricular pacing.   Persistent atrial fibrillation for which she takes amiodarone and Rivaroxaban.  She is atrially dependent  Repeat cardioversion spring/20, 1/21  Noted to be in afib again  Increasing lassitude and fatigue.  Difficulty with walking across the room.  Describes lightheaded heaviness.  Some shortness of breath.  No edema.  Underwent CRT upgrade 7/21. Course complicated by hematoma.  Xarelto held.  Overall has felt better.  Less wobbly.  Some tachypalpitations.        Date TSH LFTs Cr K Hgb  1/18  1.14      14  5/18    1.63    8/18 1.24(12/18)  - 21 1.47  11.8 (4/18)  1/19  37 1.00 3.4 11.3  12/19 1.65 14 1.51>1.15 4.2 13.0  3/20 0.951 25 1.31 3.4 11.6  1/21 1.42 16 1.90 3.9   4/21   14 2.08 3.7 12.1  7/21   1.7 3.6 11.4      DATE TEST EF   2/18 Echo   60-65 % Eccentric MR-mild  12/18  Echo  55-60%   3/20 TEE 50-55% PA sys 56/LAE  4/21 Echo  25%   5/21 Myoview 35% No ischemia       Past Medical History:  Diagnosis Date  . Anxiety   . Aortic valve sclerosis   . Atrial fibrillation ([place])   . Basal cell carcinoma    hx; near eyes  . Chronic systolic CHF (congestive heart failure) (HCC)    Echocardiogram 03/2020: EF 25-30, mid-dist lat, dist ant, mid-dist inf, mid-dist septal and apical severe HK/AK, Gr 2 DD, mild reduced RVSF, RVSP 41.8, severe LAE, mod RAE, mild to mod MR, mild to mod TR, AoV sclerosis (no AS)  . GERD (gastroesophageal reflux disease)   . H/O one miscarriage   . Hard of hearing    has hearing aides but does not wear   . History of hiatal hernia   . History of  kidney stones   . Hyperlipidemia   . Hypertension   . OA (osteoarthritis)    "right pointer; right hip" (01/05/2017)  . Obesity   . Renal insufficiency   . SSS (sick sinus syndrome) ([place]) 01/05/2017   a. s/p MDT dual chamber PPM   . Tinnitus   . Varicose veins   . Wears glasses     Past Surgical History:  Procedure Laterality Date  . BIV UPGRADE N/A 05/30/2020   Procedure: BIV PPM UPGRADE;  Surgeon: Deboraha Sprang, MD;  Location: [place] [facility];  Service: Cardiovascular;  Laterality: N/A;  . BREAST CYST EXCISION Right 1977   "benign"  . CARDIOVERSION N/A 07/25/2016   Procedure: CARDIOVERSION;  Surgeon: Lelon Perla, MD;  Location: [facility] ENDOSCOPY;  Service: Cardiovascular;  Laterality: N/A;  . CARDIOVERSION N/A 01/20/2019   Procedure: CARDIOVERSION;  Surgeon: Buford Dresser, MD;  Location: [facility] ENDOSCOPY;  Service: Cardiovascular;  Laterality: N/A;  . CARDIOVERSION N/A 12/05/2019   Procedure: CARDIOVERSION;  Surgeon: Dorothy Spark, MD;  Location: [facility] ENDOSCOPY;  Service: Cardiovascular;  Laterality: N/A;  . CARDIOVERSION N/A 05/07/2020  Procedure: CARDIOVERSION;  Surgeon: Acie Fredrickson Wonda Cheng, MD;  Location: [place];  Service: Cardiovascular;  Laterality: N/A;  . CATARACT EXTRACTION W/ INTRAOCULAR LENS  IMPLANT, BILATERAL Bilateral   . DILATION AND CURETTAGE OF UTERUS    . NASAL SEPTUM SURGERY    . OVARIAN CYST REMOVAL  2002  . PACEMAKER IMPLANT N/A 01/05/2017   Procedure: Pacemaker Implant;  Surgeon: Will Meredith Leeds, MD;  Location: [place] [facility];  Service: Cardiovascular;  Laterality: N/A;  . TEE WITHOUT CARDIOVERSION N/A 12/16/2016   Procedure: TRANSESOPHAGEAL ECHOCARDIOGRAM (TEE);  Surgeon: Larey Dresser, MD;  Location: [place];  Service: Cardiovascular;  Laterality: N/A;  . TEE WITHOUT CARDIOVERSION N/A 01/20/2019   Procedure: TRANSESOPHAGEAL ECHOCARDIOGRAM (TEE);  Surgeon: Buford Dresser, MD;  Location[facility] ENDOSCOPY;  Service: Cardiovascular;   Laterality: N/A;  . TONSILLECTOMY    . TOTAL HIP ARTHROPLASTY Left 12/18/2015   Procedure: LEFT TOTAL HIP ARTHROPLASTY ANTERIOR APPROACH;  Surgeon: Paralee Cancel, MD;  Location: [facility] ORS;  Service: Orthopedics;  Laterality: Left;    Current Outpatient Medications  Medication Sig Dispense Refill  . acetaminophen (TYLENOL) 500 MG tablet Take 1,000 mg by mouth at bedtime.     . ALPRAZolam (XANAX) 0.5 MG tablet Take 0.5 mg by mouth at bedtime.     Marland Kitchen amiodarone (PACERONE) 200 MG tablet Take 1 tablet (200 mg total) by mouth daily. 90 tablet 3  . busPIRone (BUSPAR) 5 MG tablet Take 5 mg by mouth 2 (two) times daily as needed (for stress/anxiety).     . carvedilol (COREG) 25 MG tablet Take 0.5 tablets (12.5 mg total) by mouth 2 (two) times daily. 90 tablet 3  . Cyanocobalamin (B-12) 2500 MCG TABS Take 2,500 mcg by mouth every other day.     . furosemide (LASIX) 40 MG tablet Take 20-40 mg by mouth See admin instructions. 40 mg in the morning, 20 mg in the evening    . hydrALAZINE (APRESOLINE) 10 MG tablet Take 1 tablet (10 mg total) by mouth daily. (Patient taking differently: Take 10 mg by mouth at bedtime. ) 60 tablet 3  . Magnesium 400 MG CAPS Take 400 mg by mouth 3 (three) times a week.     . Multiple Vitamin (MULTIVITAMIN WITH MINERALS) TABS tablet Take 1 tablet by mouth daily.    Vladimir Faster Glycol-Propyl Glycol (SYSTANE OP) Place 1 drop into both eyes daily as needed (dry eyes).    . Potassium 99 MG TABS Take 99 mg by mouth daily.    . isosorbide mononitrate (IMDUR) 30 MG 24 hr tablet Take 0.5 tablets (15 mg total) by mouth daily. (Patient taking differently: Take 15 mg by mouth at bedtime. ) 90 tablet 3   No current facility-administered medications for this visit.        Review of Systems negative except from HPI and PMH  Physical Exam BP 112/86   Pulse (!) 103   Ht 5' 4.5" (1.638 m)   Wt 141 lb (64 kg)   SpO2 97%   BMI 23.83 kg/m  Well developed and well nourished in no acute  distress HENT normal Neck supple with JVP-flat Clear Device pocket well healed; with hematoma with greenish purplish discoloration there is no tethering  Regular rate and rhythm, no  gallop No  murmur Abd-soft with active BS No Clubbing cyanosis  edema Skin-warm and dry A & Oriented  Grossly normal sensory and motor function  ECG atrial tachycardia with P synchronous pacing with an upright QRS lead V1  and a QR lead I  Assessment and  Plan  Atrial fibrillation-persistent  Sinus node dysfunction-profound   Heart block-high-grade  HFrEF class II  Pacemaker MRI Medtronic CRT upgrade 7/21  renal failure grade 3  chronic  MR moderate  1AVB-profound   High Risk Medication Surveillance amiodarone    Tolerating amiodarone.  Recurrent atrial tachycardia at a rate of about 110.  Comprising about 25% of her beats.  Continue amiodarone.  Have reprogrammed the device to upper tracking rate of 100 bpm.  Hematoma seems to be stable with discoloration.  We will look at it next week with the anticipation of resuming anticoagulation  Renal function is stable.  Will reevaluate LV function in about 6 weeks to make a determination as to whether we should resume afterload reducing medications, specifically hydralazine/nitrates.  She is currently on daily hydralazine which we will stop and continue her on her nitrates for now

## 2020-06-20 ENCOUNTER — Telehealth: Payer: Self-pay

## 2020-06-20 NOTE — Telephone Encounter (Signed)
The pt states she feels really good. She received her new monitor. I let her know she do not need to push the button it will automatically read her nightly. It will only send a full transmission if it see any arrhythmias on the pacemaker

## 2020-06-25 ENCOUNTER — Encounter: Payer: Medicare Other | Admitting: Internal Medicine

## 2020-06-26 ENCOUNTER — Encounter: Payer: Self-pay | Admitting: Internal Medicine

## 2020-06-26 ENCOUNTER — Other Ambulatory Visit: Payer: Self-pay

## 2020-06-26 ENCOUNTER — Ambulatory Visit (INDEPENDENT_AMBULATORY_CARE_PROVIDER_SITE_OTHER): Payer: Medicare Other | Admitting: Internal Medicine

## 2020-06-26 VITALS — BP 116/60 | HR 67 | Ht 64.5 in | Wt 144.0 lb

## 2020-06-26 DIAGNOSIS — I4819 Other persistent atrial fibrillation: Secondary | ICD-10-CM

## 2020-06-26 DIAGNOSIS — I495 Sick sinus syndrome: Secondary | ICD-10-CM

## 2020-06-26 DIAGNOSIS — Z950 Presence of cardiac pacemaker: Secondary | ICD-10-CM

## 2020-06-26 LAB — CUP PACEART INCLINIC DEVICE CHECK
Battery Remaining Longevity: 60 mo
Battery Voltage: 3.13 V
Brady Statistic AP VP Percent: 94.93 %
Brady Statistic AP VS Percent: 0.02 %
Brady Statistic AS VP Percent: 4.82 %
Brady Statistic AS VS Percent: 0.24 %
Brady Statistic RA Percent Paced: 95.22 %
Brady Statistic RV Percent Paced: 99.75 %
Date Time Interrogation Session: 20210817173958
Implantable Lead Implant Date: 20180226
Implantable Lead Implant Date: 20180226
Implantable Lead Implant Date: 20210721
Implantable Lead Location: 753858
Implantable Lead Location: 753859
Implantable Lead Location: 753860
Implantable Lead Model: 5076
Implantable Lead Model: 5076
Implantable Pulse Generator Implant Date: 20210721
Lead Channel Impedance Value: 1121 Ohm
Lead Channel Impedance Value: 1121 Ohm
Lead Channel Impedance Value: 1159 Ohm
Lead Channel Impedance Value: 247 Ohm
Lead Channel Impedance Value: 361 Ohm
Lead Channel Impedance Value: 380 Ohm
Lead Channel Impedance Value: 437 Ohm
Lead Channel Impedance Value: 437 Ohm
Lead Channel Impedance Value: 551 Ohm
Lead Channel Impedance Value: 646 Ohm
Lead Channel Impedance Value: 665 Ohm
Lead Channel Impedance Value: 912 Ohm
Lead Channel Impedance Value: 969 Ohm
Lead Channel Impedance Value: 988 Ohm
Lead Channel Pacing Threshold Amplitude: 3.25 V
Lead Channel Pacing Threshold Amplitude: 3.75 V
Lead Channel Pacing Threshold Pulse Width: 0.8 ms
Lead Channel Pacing Threshold Pulse Width: 1.2 ms
Lead Channel Setting Pacing Amplitude: 2.5 V
Lead Channel Setting Pacing Amplitude: 2.5 V
Lead Channel Setting Pacing Amplitude: 3.75 V
Lead Channel Setting Pacing Pulse Width: 0.4 ms
Lead Channel Setting Pacing Pulse Width: 1.2 ms
Lead Channel Setting Sensing Sensitivity: 0.9 mV

## 2020-06-26 NOTE — Patient Instructions (Addendum)
Medication Instructions:  Your physician recommends that you continue on your current medications as directed. Please refer to the Current Medication list given to you today.  Continue to hold Xarelto  *If you need a refill on your cardiac medications before your next appointment, please call your pharmacy*   Lab Work: None ordered.  If you have labs (blood work) drawn today and your tests are completely normal, you will receive your results only by:  [place] (if you have MyChart) OR  A paper copy in the mail If you have any lab test that is abnormal or we need to change your treatment, we will call you to review the results.   Testing/Procedures: None ordered.    Follow-Up: At [facility], you and your health needs are our priority.  As part of our continuing mission to provide you with exceptional heart care, we have created designated Provider Care Teams.  These Care Teams include your primary Cardiologist (physician) and Advanced Practice Providers (APPs -  Physician Assistants and Nurse Practitioners) who all work together to provide you with the care you need, when you need it.  We recommend signing up for the patient portal called "MyChart".  Sign up information is provided on this After Visit Summary.  MyChart is used to connect with patients for Virtual Visits (Telemedicine).  Patients are able to view lab/test results, encounter notes, upcoming appointments, etc.  Non-urgent messages can be sent to your provider as well.   To learn more about what you can do with MyChart, go to [email or web address].    Your next appointment:    2 weeks with Dr Caryl Comes to recheck hematoma  07/13/2020 at 1130am

## 2020-06-26 NOTE — Progress Notes (Signed)
Seen in follow-up because of diaphragmatic stimulation.  DM-RN has reprogrammed the device.  There has been some degree of migration.  She is now contained only through pole 2 and her best threshold is a 2--can which is associated with some diaphragmatic stimulation.  The outputs were reprogrammed to a half a volt above threshold hopefully minimize this.  She feels better than she has in years.  Review of the post implant chest x-ray demonstrates modest amount of tension in the LV lead suggesting micro-retrolead movement.  Also has a pocket hematoma.  Soft.  Violaceous green-ish hue  .  We will continue to hold his Xarelto

## 2020-06-28 ENCOUNTER — Encounter: Payer: Medicare Other | Admitting: Physician Assistant

## 2020-06-29 ENCOUNTER — Encounter: Payer: Medicare Other | Admitting: Physician Assistant

## 2020-07-11 ENCOUNTER — Other Ambulatory Visit: Payer: Self-pay | Admitting: Internal Medicine

## 2020-07-13 ENCOUNTER — Other Ambulatory Visit: Payer: Self-pay | Admitting: Internal Medicine

## 2020-07-13 ENCOUNTER — Telehealth: Payer: Self-pay

## 2020-07-13 ENCOUNTER — Other Ambulatory Visit: Payer: Self-pay

## 2020-07-13 ENCOUNTER — Ambulatory Visit: Payer: Medicare Other | Admitting: Internal Medicine

## 2020-07-13 ENCOUNTER — Encounter: Payer: Self-pay | Admitting: Internal Medicine

## 2020-07-13 VITALS — BP 121/81 | HR 65 | Ht 62.0 in | Wt 142.8 lb

## 2020-07-13 DIAGNOSIS — I495 Sick sinus syndrome: Secondary | ICD-10-CM

## 2020-07-13 DIAGNOSIS — I951 Orthostatic hypotension: Secondary | ICD-10-CM | POA: Diagnosis not present

## 2020-07-13 DIAGNOSIS — R001 Bradycardia, unspecified: Secondary | ICD-10-CM

## 2020-07-13 DIAGNOSIS — I480 Paroxysmal atrial fibrillation: Secondary | ICD-10-CM

## 2020-07-13 DIAGNOSIS — I5021 Acute systolic (congestive) heart failure: Secondary | ICD-10-CM | POA: Diagnosis not present

## 2020-07-13 DIAGNOSIS — I4819 Other persistent atrial fibrillation: Secondary | ICD-10-CM

## 2020-07-13 LAB — CUP PACEART INCLINIC DEVICE CHECK
Battery Remaining Longevity: 60 mo
Battery Voltage: 3.11 V
Brady Statistic AP VP Percent: 96.83 %
Brady Statistic AP VS Percent: 0.01 %
Brady Statistic AS VP Percent: 3.02 %
Brady Statistic AS VS Percent: 0.14 %
Brady Statistic RA Percent Paced: 95.8 %
Brady Statistic RV Percent Paced: 99.84 %
Date Time Interrogation Session: 20210903134529
Implantable Lead Implant Date: 20180226
Implantable Lead Implant Date: 20180226
Implantable Lead Implant Date: 20210721
Implantable Lead Location: 753858
Implantable Lead Location: 753859
Implantable Lead Location: 753860
Implantable Lead Model: 5076
Implantable Lead Model: 5076
Implantable Pulse Generator Implant Date: 20210721
Lead Channel Impedance Value: 1026 Ohm
Lead Channel Impedance Value: 1045 Ohm
Lead Channel Impedance Value: 1140 Ohm
Lead Channel Impedance Value: 1159 Ohm
Lead Channel Impedance Value: 1197 Ohm
Lead Channel Impedance Value: 266 Ohm
Lead Channel Impedance Value: 399 Ohm
Lead Channel Impedance Value: 418 Ohm
Lead Channel Impedance Value: 456 Ohm
Lead Channel Impedance Value: 475 Ohm
Lead Channel Impedance Value: 532 Ohm
Lead Channel Impedance Value: 646 Ohm
Lead Channel Impedance Value: 646 Ohm
Lead Channel Impedance Value: 969 Ohm
Lead Channel Pacing Threshold Amplitude: 0.75 V
Lead Channel Pacing Threshold Amplitude: 1.25 V
Lead Channel Pacing Threshold Amplitude: 3.75 V
Lead Channel Pacing Threshold Pulse Width: 0.4 ms
Lead Channel Pacing Threshold Pulse Width: 0.4 ms
Lead Channel Pacing Threshold Pulse Width: 0.8 ms
Lead Channel Sensing Intrinsic Amplitude: 1.5 mV
Lead Channel Sensing Intrinsic Amplitude: 1.5 mV
Lead Channel Sensing Intrinsic Amplitude: 12.625 mV
Lead Channel Sensing Intrinsic Amplitude: 14 mV
Lead Channel Setting Pacing Amplitude: 2.5 V
Lead Channel Setting Pacing Amplitude: 2.5 V
Lead Channel Setting Pacing Amplitude: 3.75 V
Lead Channel Setting Pacing Pulse Width: 0.4 ms
Lead Channel Setting Pacing Pulse Width: 1.2 ms
Lead Channel Setting Sensing Sensitivity: 0.9 mV

## 2020-07-13 MED ORDER — RIVAROXABAN 15 MG PO TABS: ORAL_TABLET | ORAL | 3 refills | Status: DC

## 2020-07-13 MED ORDER — FUROSEMIDE 40 MG PO TABS: ORAL_TABLET | ORAL | 3 refills | Status: DC

## 2020-07-13 MED ORDER — CARVEDILOL 6.25 MG PO TABS
6.2500 mg | ORAL_TABLET | Freq: Two times a day (BID) | ORAL | 3 refills | Status: DC
Start: 2020-07-13 — End: 2020-11-21

## 2020-07-13 NOTE — Telephone Encounter (Signed)
**Note De-Identified Rickayla Wieland Obfuscation** I started a Amiodarone PA through covermymeds: Key: [id number]

## 2020-07-13 NOTE — Progress Notes (Signed)
Patient Care Team: Chesley Noon, MD as PCP - General (Family Medicine) Deboraha Sprang, MD as PCP - Cardiology (Cardiology)   HPI  [name] is a 86 y.o. female Seen in follow-up for a pocket hematoma developing following CRT upgrade 7/21.  She has been off her anticoagulation.  She describes episodes of lightheadedness and gait instability with standing.  Maybe has atrial fibrillation but more recently she thinks it is not.  Because of her cardiomyopathy, hydralazine and isordil were added,  Takes amiodarone.  Date TSH LFTs Cr K Hgb  1/18  1.14      14  5/18    1.63    8/18 1.24(12/18)  - 21 1.47  11.8 (4/18)  1/19  37 1.00 3.4 11.3  12/19 1.65 14 1.51>1.15 4.2 13.0  3/20 0.951 25 1.31 3.4 11.6  1/21 1.42 16 1.90 3.9   4/21   14 2.08 3.7 12.1  7/21   1.7 3.6 11.4      DATE TEST EF   2/18 Echo   60-65 % Eccentric MR-mild  12/18  Echo  55-60%   3/20 TEE 50-55% PA sys 56/LAE  4/21 Echo  25%   5/21 Myoview 35% No ischemia     Records and Results Reviewed   Past Medical History:  Diagnosis Date  . Anxiety   . Aortic valve sclerosis   . Atrial fibrillation ([place])   . Basal cell carcinoma    hx; near eyes  . Chronic systolic CHF (congestive heart failure) (HCC)    Echocardiogram 03/2020: EF 25-30, mid-dist lat, dist ant, mid-dist inf, mid-dist septal and apical severe HK/AK, Gr 2 DD, mild reduced RVSF, RVSP 41.8, severe LAE, mod RAE, mild to mod MR, mild to mod TR, AoV sclerosis (no AS)  . GERD (gastroesophageal reflux disease)   . H/O one miscarriage   . Hard of hearing    has hearing aides but does not wear   . History of hiatal hernia   . History of kidney stones   . Hyperlipidemia   . Hypertension   . OA (osteoarthritis)    "right pointer; right hip" (01/05/2017)  . Obesity   . Renal insufficiency   . SSS (sick sinus syndrome) ([place]) 01/05/2017   a. s/p MDT dual chamber PPM   . Tinnitus   . Varicose veins   . Wears  glasses     Past Surgical History:  Procedure Laterality Date  . BIV UPGRADE N/A 05/30/2020   Procedure: BIV PPM UPGRADE;  Surgeon: Deboraha Sprang, MD;  Location: [place] [facility];  Service: Cardiovascular;  Laterality: N/A;  . BREAST CYST EXCISION Right 1977   "benign"  . CARDIOVERSION N/A 07/25/2016   Procedure: CARDIOVERSION;  Surgeon: Lelon Perla, MD;  Location: [facility] ENDOSCOPY;  Service: Cardiovascular;  Laterality: N/A;  . CARDIOVERSION N/A 01/20/2019   Procedure: CARDIOVERSION;  Surgeon: Buford Dresser, MD;  Location: [facility] ENDOSCOPY;  Service: Cardiovascular;  Laterality: N/A;  . CARDIOVERSION N/A 12/05/2019   Procedure: CARDIOVERSION;  Surgeon: Dorothy Spark, MD;  Location: [facility] ENDOSCOPY;  Service: Cardiovascular;  Laterality: N/A;  . CARDIOVERSION N/A 05/07/2020   Procedure: CARDIOVERSION;  Surgeon: Acie Fredrickson Wonda Cheng, MD;  Location: [place];  Service: Cardiovascular;  Laterality: N/A;  . CATARACT EXTRACTION W/ INTRAOCULAR LENS  IMPLANT, BILATERAL Bilateral   . DILATION AND CURETTAGE OF UTERUS    . NASAL SEPTUM SURGERY    . OVARIAN CYST REMOVAL  2002  .  PACEMAKER IMPLANT N/A 01/05/2017   Procedure: Pacemaker Implant;  Surgeon: Will Meredith Leeds, MD;  Location: [place] [facility];  Service: Cardiovascular;  Laterality: N/A;  . TEE WITHOUT CARDIOVERSION N/A 12/16/2016   Procedure: TRANSESOPHAGEAL ECHOCARDIOGRAM (TEE);  Surgeon: Larey Dresser, MD;  Location: [place];  Service: Cardiovascular;  Laterality: N/A;  . TEE WITHOUT CARDIOVERSION N/A 01/20/2019   Procedure: TRANSESOPHAGEAL ECHOCARDIOGRAM (TEE);  Surgeon: Buford Dresser, MD;  Location: [facility] ENDOSCOPY;  Service: Cardiovascular;  Laterality: N/A;  . TONSILLECTOMY    . TOTAL HIP ARTHROPLASTY Left 12/18/2015   Procedure: LEFT TOTAL HIP ARTHROPLASTY ANTERIOR APPROACH;  Surgeon: Paralee Cancel, MD;  Location: [facility];  Service: Orthopedics;  Laterality: Left;    Current Meds  Medication Sig  .  acetaminophen (TYLENOL) 500 MG tablet Take 1,000 mg by mouth at bedtime.   . ALPRAZolam (XANAX) 0.5 MG tablet Take 0.5 mg by mouth at bedtime.   Marland Kitchen amiodarone (PACERONE) 200 MG tablet Take 1 tablet (200 mg total) by mouth daily.  . busPIRone (BUSPAR) 5 MG tablet Take 5 mg by mouth 2 (two) times daily as needed (for stress/anxiety).   . carvedilol (COREG) 25 MG tablet Take 0.5 tablets (12.5 mg total) by mouth 2 (two) times daily.  . Cyanocobalamin (B-12) 2500 MCG TABS Take 2,500 mcg by mouth every other day.   . furosemide (LASIX) 40 MG tablet Take 20-40 mg by mouth See admin instructions. 40 mg in the morning, 20 mg in the evening  . isosorbide dinitrate (ISORDIL) 30 MG tablet Take 0.5 tablet by mouth twice daily  . Magnesium 400 MG CAPS Take 400 mg by mouth 3 (three) times a week.   . Multiple Vitamin (MULTIVITAMIN WITH MINERALS) TABS tablet Take 1 tablet by mouth daily.  Vladimir Faster Glycol-Propyl Glycol (SYSTANE OP) Place 1 drop into both eyes daily as needed (dry eyes).  . Potassium 99 MG TABS Take 99 mg by mouth daily.    Allergies  Allergen Reactions  . Ace Inhibitors Cough and Other (See Comments)    Pt unsure   . Amlodipine Other (See Comments)    Higher doses Higher doses = VERY BAD COUGHING  . Celebrex [Celecoxib] Other (See Comments)    Reflux  . Codeine Nausea Only  . Rofecoxib Other (See Comments) and Swelling    Pt unsure of reaction Pt unsure  Pt unsure of reaction  . Ether For Anesthesia [Ether]     Burning and dry cough  . Norvasc [Amlodipine Besylate] Cough    Higher doses = VERY BAD COUGHING       Review of Systems negative except from HPI and PMH  Physical Exam BP 120/78   Pulse 84   Ht 5\' 2"  (1.575 m)   Wt 142 lb 12.8 oz (64.8 kg)   SpO2 96%   BMI 26.12 kg/m  Well developed and well nourished in no acute distress HENT normal E scleral and icterus clear Neck Supple JVP flat; carotids brisk and full Clear to ausculation Pocket with swelling and  now soft hematoma Regular rate and rhythm, no murmurs gallops or rub Soft with active bowel sounds No clubbing cyanosis  Edema Alert and oriented, grossly normal motor and sensory function Skin Warm and Dry  ECG    CrCl cannot be calculated (Patient's most recent lab result is older than the maximum 21 days allowed.).   Assessment and  Plan  Orthostatic lightheadedness  Atrial fibrillation-persistent  High-grade heart block  Pocket hematoma  High risk  medication surveillance-amiodarone   Amiodarone tolerated will continue--have to be cognizant that it could be contributing to her orthostatic symptoms.  We discussed the physiology of orthostatic intolerance including gravitational fluid shifts and the impact of hypertensive vascular disease on orthostasis and treatment options.  We discussed possiblity of pharmacological and nonpharmacological options . We emphasized the importance of recognizing the prodrome and sitting prior to falling, safety in the shower and in the bathroom and the avoidance of dehydration   Afterload reduction instituted for her hopefully pacemaker associated cardiomyopathy seems to be concurrent with worsening orthostasis.  We will begin with decreasing her carvedilol 12.5 >>6.25  And stopping her Isordil  Atrial fibrillation is relatively infrequent and thus not likely responsible for her positional wobbliness.         Current medicines are reviewed at length with the patient today .  The patient does not  have concerns regarding medicines.

## 2020-07-13 NOTE — Patient Instructions (Addendum)
Medication Instructions:   Your physician has recommended you make the following change in your medication: Take your Carvedilol (Coreg) 6.25 mg by mouth twice a day (morning and night)   Restart Xarelto 15 mg once a day  STOP Isordil    *If you need a refill on your cardiac medications before your next appointment, please call your pharmacy*   Lab Work: none If you have labs (blood work) drawn today and your tests are completely normal, you will receive your results only by: Marland Kitchen MyChart Message (if you have MyChart) OR . A paper copy in the mail If you have any lab test that is abnormal or we need to change your treatment, we will call you to review the results.   Testing/Procedures: none   Follow-Up: At [facility], you and your health needs are our priority.  As part of our continuing mission to provide you with exceptional heart care, we have created designated Provider Care Teams.  These Care Teams include your primary Cardiologist (physician) and Advanced Practice Providers (APPs -  Physician Assistants and Nurse Practitioners) who all work together to provide you with the care you need, when you need it.  We recommend signing up for the patient portal called "MyChart".  Sign up information is provided on this After Visit Summary.  MyChart is used to connect with patients for Virtual Visits (Telemedicine).  Patients are able to view lab/test results, encounter notes, upcoming appointments, etc.  Non-urgent messages can be sent to your provider as well.   To learn more about what you can do with MyChart, go to [email or web address].    Your next appointment:     Other Instructions

## 2020-07-17 ENCOUNTER — Other Ambulatory Visit (INDEPENDENT_AMBULATORY_CARE_PROVIDER_SITE_OTHER): Payer: Medicare Other | Admitting: *Deleted

## 2020-07-17 DIAGNOSIS — I5021 Acute systolic (congestive) heart failure: Secondary | ICD-10-CM

## 2020-07-17 DIAGNOSIS — R001 Bradycardia, unspecified: Secondary | ICD-10-CM

## 2020-07-17 DIAGNOSIS — I495 Sick sinus syndrome: Secondary | ICD-10-CM

## 2020-07-17 DIAGNOSIS — I480 Paroxysmal atrial fibrillation: Secondary | ICD-10-CM

## 2020-07-17 DIAGNOSIS — I4819 Other persistent atrial fibrillation: Secondary | ICD-10-CM

## 2020-07-21 ENCOUNTER — Other Ambulatory Visit: Payer: Self-pay | Admitting: Internal Medicine

## 2020-07-23 NOTE — Telephone Encounter (Signed)
Pt last saw Dr Caryl Comes 07/13/20, last labs 05/25/20 Creat 1.70, age 86, weight 64.8kg, CrCl 23.85, based on CrCl pt is on appropriate dosage of Xarelto 15mg  QD.  Will refill rx.

## 2020-07-24 DIAGNOSIS — I10 Essential (primary) hypertension: Secondary | ICD-10-CM | POA: Diagnosis not present

## 2020-07-24 DIAGNOSIS — Z1329 Encounter for screening for other suspected endocrine disorder: Secondary | ICD-10-CM | POA: Diagnosis not present

## 2020-07-24 DIAGNOSIS — S20219S Contusion of unspecified front wall of thorax, sequela: Secondary | ICD-10-CM | POA: Diagnosis not present

## 2020-07-24 DIAGNOSIS — N184 Chronic kidney disease, stage 4 (severe): Secondary | ICD-10-CM | POA: Diagnosis not present

## 2020-07-24 DIAGNOSIS — K219 Gastro-esophageal reflux disease without esophagitis: Secondary | ICD-10-CM | POA: Diagnosis not present

## 2020-07-24 DIAGNOSIS — E7800 Pure hypercholesterolemia, unspecified: Secondary | ICD-10-CM | POA: Diagnosis not present

## 2020-07-31 ENCOUNTER — Other Ambulatory Visit: Payer: Self-pay

## 2020-07-31 ENCOUNTER — Ambulatory Visit (HOSPITAL_COMMUNITY): Payer: Medicare Other | Attending: Cardiovascular Disease

## 2020-07-31 DIAGNOSIS — I081 Rheumatic disorders of both mitral and tricuspid valves: Secondary | ICD-10-CM | POA: Diagnosis not present

## 2020-07-31 DIAGNOSIS — I509 Heart failure, unspecified: Secondary | ICD-10-CM | POA: Insufficient documentation

## 2020-07-31 DIAGNOSIS — Z950 Presence of cardiac pacemaker: Secondary | ICD-10-CM | POA: Insufficient documentation

## 2020-07-31 DIAGNOSIS — I495 Sick sinus syndrome: Secondary | ICD-10-CM | POA: Diagnosis not present

## 2020-07-31 DIAGNOSIS — I4891 Unspecified atrial fibrillation: Secondary | ICD-10-CM | POA: Diagnosis not present

## 2020-07-31 DIAGNOSIS — E785 Hyperlipidemia, unspecified: Secondary | ICD-10-CM | POA: Insufficient documentation

## 2020-07-31 DIAGNOSIS — I429 Cardiomyopathy, unspecified: Secondary | ICD-10-CM | POA: Diagnosis not present

## 2020-07-31 DIAGNOSIS — I110 Hypertensive heart disease with heart failure: Secondary | ICD-10-CM | POA: Diagnosis not present

## 2020-07-31 LAB — ECHOCARDIOGRAM COMPLETE
Area-P 1/2: 3.77 cm2
S' Lateral: 3 cm

## 2020-08-06 DIAGNOSIS — R42 Dizziness and giddiness: Secondary | ICD-10-CM | POA: Insufficient documentation

## 2020-08-07 ENCOUNTER — Other Ambulatory Visit: Payer: Self-pay

## 2020-08-07 ENCOUNTER — Encounter: Payer: Self-pay | Admitting: Internal Medicine

## 2020-08-07 ENCOUNTER — Ambulatory Visit: Payer: Medicare Other | Admitting: Internal Medicine

## 2020-08-07 VITALS — BP 130/70 | HR 75 | Ht 64.0 in | Wt 137.0 lb

## 2020-08-07 DIAGNOSIS — I4819 Other persistent atrial fibrillation: Secondary | ICD-10-CM | POA: Diagnosis not present

## 2020-08-07 DIAGNOSIS — I495 Sick sinus syndrome: Secondary | ICD-10-CM | POA: Diagnosis not present

## 2020-08-07 DIAGNOSIS — Z950 Presence of cardiac pacemaker: Secondary | ICD-10-CM

## 2020-08-07 DIAGNOSIS — R42 Dizziness and giddiness: Secondary | ICD-10-CM

## 2020-08-07 DIAGNOSIS — E032 Hypothyroidism due to medicaments and other exogenous substances: Secondary | ICD-10-CM

## 2020-08-07 LAB — CUP PACEART INCLINIC DEVICE CHECK
Battery Remaining Longevity: 57 mo
Battery Voltage: 3.06 V
Brady Statistic AP VP Percent: 98.53 %
Brady Statistic AP VS Percent: 0.13 %
Brady Statistic AS VP Percent: 1.09 %
Brady Statistic AS VS Percent: 0.25 %
Brady Statistic RA Percent Paced: 98.8 %
Brady Statistic RV Percent Paced: 99.62 %
Date Time Interrogation Session: 20210928144551
Implantable Lead Implant Date: 20180226
Implantable Lead Implant Date: 20180226
Implantable Lead Implant Date: 20210721
Implantable Lead Location: 753858
Implantable Lead Location: 753859
Implantable Lead Location: 753860
Implantable Lead Model: 5076
Implantable Lead Model: 5076
Implantable Pulse Generator Implant Date: 20210721
Lead Channel Impedance Value: 1026 Ohm
Lead Channel Impedance Value: 1045 Ohm
Lead Channel Impedance Value: 1121 Ohm
Lead Channel Impedance Value: 285 Ohm
Lead Channel Impedance Value: 399 Ohm
Lead Channel Impedance Value: 399 Ohm
Lead Channel Impedance Value: 399 Ohm
Lead Channel Impedance Value: 475 Ohm
Lead Channel Impedance Value: 475 Ohm
Lead Channel Impedance Value: 608 Ohm
Lead Channel Impedance Value: 608 Ohm
Lead Channel Impedance Value: 836 Ohm
Lead Channel Impedance Value: 931 Ohm
Lead Channel Impedance Value: 950 Ohm
Lead Channel Pacing Threshold Amplitude: 0.75 V
Lead Channel Pacing Threshold Amplitude: 1.25 V
Lead Channel Pacing Threshold Amplitude: 3.75 V
Lead Channel Pacing Threshold Pulse Width: 0.4 ms
Lead Channel Pacing Threshold Pulse Width: 0.4 ms
Lead Channel Pacing Threshold Pulse Width: 0.8 ms
Lead Channel Sensing Intrinsic Amplitude: 0.75 mV
Lead Channel Sensing Intrinsic Amplitude: 1.75 mV
Lead Channel Sensing Intrinsic Amplitude: 12.625 mV
Lead Channel Sensing Intrinsic Amplitude: 13.875 mV
Lead Channel Setting Pacing Amplitude: 2.5 V
Lead Channel Setting Pacing Amplitude: 2.5 V
Lead Channel Setting Pacing Amplitude: 3.75 V
Lead Channel Setting Pacing Pulse Width: 0.4 ms
Lead Channel Setting Pacing Pulse Width: 1.2 ms
Lead Channel Setting Sensing Sensitivity: 0.9 mV

## 2020-08-07 MED ORDER — AMIODARONE HCL 200 MG PO TABS
100.0000 mg | ORAL_TABLET | Freq: Every day | ORAL | 0 refills | Status: DC
Start: 2020-08-07 — End: 2020-11-21

## 2020-08-07 NOTE — Patient Instructions (Addendum)
Medication Instructions:  Your physician has recommended you make the following change in your medication:   Decrease your Amiodarone to 100mg  (1/2 tablet) by mouth daily  *If you need a refill on your cardiac medications before your next appointment, please call your pharmacy*   Lab Work: TSH  - 09/06/2020 You may come anytime between 8am and 430pm  If you have labs (blood work) drawn today and your tests are completely normal, you will receive your results only by: Marland Kitchen MyChart Message (if you have MyChart) OR . A paper copy in the mail If you have any lab test that is abnormal or we need to change your treatment, we will call you to review the results.   Testing/Procedures: None ordered.    Follow-Up: At [facility], you and your health needs are our priority.  As part of our continuing mission to provide you with exceptional heart care, we have created designated Provider Care Teams.  These Care Teams include your primary Cardiologist (physician) and Advanced Practice Providers (APPs -  Physician Assistants and Nurse Practitioners) who all work together to provide you with the care you need, when you need it.  We recommend signing up for the patient portal called "MyChart".  Sign up information is provided on this After Visit Summary.  MyChart is used to connect with patients for Virtual Visits (Telemedicine).  Patients are able to view lab/test results, encounter notes, upcoming appointments, etc.  Non-urgent messages can be sent to your provider as well.   To learn more about what you can do with MyChart, go to [email or web address].    Your next appointment:  11/12/2020 at 1030 with Oda Kilts, PA-C

## 2020-08-07 NOTE — Progress Notes (Signed)
Patient Care Team: Chesley Noon, MD as PCP - General (Family Medicine) Deboraha Sprang, MD as PCP - Cardiology (Cardiology)   HPI  [name] is a 86 y.o. female Seen in follow-up for a pocket hematoma developing following CRT upgrade 7/21.  She has been off her anticoagulation.  Breathlessness is much better.  Lightheadedness with standing is improved.  No edema.  No nocturnal dyspnea orthopnea.  Does complain of fatigue.  Because of her cardiomyopathy, hydralazine and isordil were added,  Takes amiodarone.  Date TSH LFTs Cr K Hgb  1/18  1.14      14  5/18    1.63    8/18 1.24(12/18)  - 21 1.47  11.8 (4/18)  1/19  37 1.00 3.4 11.3  12/19 1.65 14 1.51>1.15 4.2 13.0  3/20 0.951 25 1.31 3.4 11.6  1/21 1.42 16 1.90 3.9   4/21   14 2.08 3.7 12.1  7/21 0.046  1.7 3.6 11.4      DATE TEST EF   2/18 Echo   60-65 % Eccentric MR-mild  12/18  Echo  55-60%   3/20 TEE 50-55% PA sys 56/LAE  4/21 Echo  25%   5/21 Myoview 35% No ischemia  9/21 Echo  55-60% LAE     Records and Results Reviewed   Past Medical History:  Diagnosis Date  . Anxiety   . Aortic valve sclerosis   . Atrial fibrillation ([place])   . Basal cell carcinoma    hx; near eyes  . Chronic systolic CHF (congestive heart failure) (HCC)    Echocardiogram 03/2020: EF 25-30, mid-dist lat, dist ant, mid-dist inf, mid-dist septal and apical severe HK/AK, Gr 2 DD, mild reduced RVSF, RVSP 41.8, severe LAE, mod RAE, mild to mod MR, mild to mod TR, AoV sclerosis (no AS)  . GERD (gastroesophageal reflux disease)   . H/O one miscarriage   . Hard of hearing    has hearing aides but does not wear   . History of hiatal hernia   . History of kidney stones   . Hyperlipidemia   . Hypertension   . OA (osteoarthritis)    "right pointer; right hip" (01/05/2017)  . Obesity   . Renal insufficiency   . SSS (sick sinus syndrome) ([place]) 01/05/2017   a. s/p MDT dual chamber PPM   . Tinnitus   .  Varicose veins   . Wears glasses     Past Surgical History:  Procedure Laterality Date  . BIV UPGRADE N/A 05/30/2020   Procedure: BIV PPM UPGRADE;  Surgeon: Deboraha Sprang, MD;  Location: [place] [facility];  Service: Cardiovascular;  Laterality: N/A;  . BREAST CYST EXCISION Right 1977   "benign"  . CARDIOVERSION N/A 07/25/2016   Procedure: CARDIOVERSION;  Surgeon: Lelon Perla, MD;  Location: [facility] ENDOSCOPY;  Service: Cardiovascular;  Laterality: N/A;  . CARDIOVERSION N/A 01/20/2019   Procedure: CARDIOVERSION;  Surgeon: Buford Dresser, MD;  Location: [facility] ENDOSCOPY;  Service: Cardiovascular;  Laterality: N/A;  . CARDIOVERSION N/A 12/05/2019   Procedure: CARDIOVERSION;  Surgeon: Dorothy Spark, MD;  Location: [facility] ENDOSCOPY;  Service: Cardiovascular;  Laterality: N/A;  . CARDIOVERSION N/A 05/07/2020   Procedure: CARDIOVERSION;  Surgeon: Acie Fredrickson Wonda Cheng, MD;  Location: [place];  Service: Cardiovascular;  Laterality: N/A;  . CATARACT EXTRACTION W/ INTRAOCULAR LENS  IMPLANT, BILATERAL Bilateral   . DILATION AND CURETTAGE OF UTERUS    . NASAL SEPTUM SURGERY    .  OVARIAN CYST REMOVAL  2002  . PACEMAKER IMPLANT N/A 01/05/2017   Procedure: Pacemaker Implant;  Surgeon: Will Meredith Leeds, MD;  Location: [place] [facility];  Service: Cardiovascular;  Laterality: N/A;  . TEE WITHOUT CARDIOVERSION N/A 12/16/2016   Procedure: TRANSESOPHAGEAL ECHOCARDIOGRAM (TEE);  Surgeon: Larey Dresser, MD;  Location: [place];  Service: Cardiovascular;  Laterality: N/A;  . TEE WITHOUT CARDIOVERSION N/A 01/20/2019   Procedure: TRANSESOPHAGEAL ECHOCARDIOGRAM (TEE);  Surgeon: Buford Dresser, MD;  Location: [facility] ENDOSCOPY;  Service: Cardiovascular;  Laterality: N/A;  . TONSILLECTOMY    . TOTAL HIP ARTHROPLASTY Left 12/18/2015   Procedure: LEFT TOTAL HIP ARTHROPLASTY ANTERIOR APPROACH;  Surgeon: Paralee Cancel, MD;  Location: [facility];  Service: Orthopedics;  Laterality: Left;    Current Meds    Medication Sig  . acetaminophen (TYLENOL) 500 MG tablet Take 1,000 mg by mouth at bedtime.   . ALPRAZolam (XANAX) 0.5 MG tablet Take 0.5 mg by mouth at bedtime.   Marland Kitchen amiodarone (PACERONE) 200 MG tablet Take 1 tablet (200 mg total) by mouth daily.  . busPIRone (BUSPAR) 5 MG tablet Take 5 mg by mouth 2 (two) times daily as needed (for stress/anxiety).   . carvedilol (COREG) 6.25 MG tablet Take 1 tablet (6.25 mg total) by mouth 2 (two) times daily.  . Cyanocobalamin (B-12) 2500 MCG TABS Take 2,500 mcg by mouth every other day.   . furosemide (LASIX) 40 MG tablet Take 40 mg by mouth in the morning, 20 mg by mouth in the evening.  . Magnesium 400 MG CAPS Take 400 mg by mouth 3 (three) times a week.   . Multiple Vitamin (MULTIVITAMIN WITH MINERALS) TABS tablet Take 1 tablet by mouth daily.  Vladimir Faster Glycol-Propyl Glycol (SYSTANE OP) Place 1 drop into both eyes daily as needed (dry eyes).  . Potassium 99 MG TABS Take 99 mg by mouth daily.  Alveda Reasons 15 MG TABS tablet TAKE 1 TABLET(15 MG) BY MOUTH DAILY WITH SUPPER    Allergies  Allergen Reactions  . Ace Inhibitors Cough and Other (See Comments)    Pt unsure   . Amlodipine Other (See Comments)    Higher doses Higher doses = VERY BAD COUGHING  . Celebrex [Celecoxib] Other (See Comments)    Reflux  . Codeine Nausea Only  . Rofecoxib Other (See Comments) and Swelling    Pt unsure of reaction Pt unsure  Pt unsure of reaction  . Ether For Anesthesia [Ether]     Burning and dry cough  . Norvasc [Amlodipine Besylate] Cough    Higher doses = VERY BAD COUGHING       Review of Systems negative except from HPI and PMH  Physical Exam BP 130/70   Pulse 75   Ht 5\' 4"  (1.626 m)   Wt 137 lb (62.1 kg)   SpO2 98%   BMI 23.52 kg/m  Well developed and well nourished in no acute distress HENT normal Neck supple with JVP-flat Clear Device pocket well healed; without hematoma or erythema.  There is no tethering  Regular rate and rhythm, no   murmur Abd-soft with active BS No Clubbing cyanosis   edema Skin-warm and dry A & Oriented  Grossly normal sensory and motor function  ECG AV pacing with an upright QRS lead V1 and a qrs in lead1 CrCl cannot be calculated (Patient's most recent lab result is older than the maximum 21 days allowed.).   Assessment and  Plan  Orthostatic lightheadedness  Atrial fibrillation-persistent  High-grade  heart block  High risk medication surveillance-amiodarone  Hyperthyroidism  Nonischemic cardiomyopathy-question pacemaker  Patient's atrial fibrillation is quiescient.  She has been treated with higher doses of amiodarone over the last months.  Unfortunately she has developed hypothyroidism.  We will try and down titrate her amiodarone for about 4 weeks and see if it has any impact on her TSH; I am not sanguine.  If not, we will have to decide as to whether we treat her hyperthyroidism or simply discontinue her amiodarone and see how her atrial fibrillation does in the context of improving left ventricular dynamics following CRT implantation and LV function improvement.  Orthostasis is improved.

## 2020-08-07 NOTE — Telephone Encounter (Signed)
**Note De-Identified Ronnell Makarewicz Obfuscation** Message received from covermymeds:  Marigene Paige Key: [id number] Need help?  Outcome Approved on September 3  Effective from 07/13/2020 through 07/13/2021.  Drug Amiodarone HCl 200MG  tablets  FormBlue Cross [place] Medicare [place] General Authorization Form

## 2020-08-29 ENCOUNTER — Ambulatory Visit (INDEPENDENT_AMBULATORY_CARE_PROVIDER_SITE_OTHER): Payer: Medicare Other

## 2020-08-29 DIAGNOSIS — I442 Atrioventricular block, complete: Secondary | ICD-10-CM | POA: Diagnosis not present

## 2020-08-31 LAB — CUP PACEART REMOTE DEVICE CHECK
Battery Remaining Longevity: 53 mo
Battery Voltage: 3.03 V
Brady Statistic AP VP Percent: 97.82 %
Brady Statistic AP VS Percent: 0.05 %
Brady Statistic AS VP Percent: 1.37 %
Brady Statistic AS VS Percent: 0.76 %
Brady Statistic RA Percent Paced: 97.95 %
Brady Statistic RV Percent Paced: 99.19 %
Date Time Interrogation Session: 20211019222006
Implantable Lead Implant Date: 20180226
Implantable Lead Implant Date: 20180226
Implantable Lead Implant Date: 20210721
Implantable Lead Location: 753858
Implantable Lead Location: 753859
Implantable Lead Location: 753860
Implantable Lead Model: 5076
Implantable Lead Model: 5076
Implantable Pulse Generator Implant Date: 20210721
Lead Channel Impedance Value: 1045 Ohm
Lead Channel Impedance Value: 1045 Ohm
Lead Channel Impedance Value: 266 Ohm
Lead Channel Impedance Value: 380 Ohm
Lead Channel Impedance Value: 380 Ohm
Lead Channel Impedance Value: 399 Ohm
Lead Channel Impedance Value: 456 Ohm
Lead Channel Impedance Value: 494 Ohm
Lead Channel Impedance Value: 532 Ohm
Lead Channel Impedance Value: 608 Ohm
Lead Channel Impedance Value: 817 Ohm
Lead Channel Impedance Value: 855 Ohm
Lead Channel Impedance Value: 912 Ohm
Lead Channel Impedance Value: 950 Ohm
Lead Channel Pacing Threshold Amplitude: 0.75 V
Lead Channel Pacing Threshold Amplitude: 1 V
Lead Channel Pacing Threshold Amplitude: 3.75 V
Lead Channel Pacing Threshold Pulse Width: 0.4 ms
Lead Channel Pacing Threshold Pulse Width: 0.4 ms
Lead Channel Pacing Threshold Pulse Width: 0.8 ms
Lead Channel Sensing Intrinsic Amplitude: 0.875 mV
Lead Channel Sensing Intrinsic Amplitude: 0.875 mV
Lead Channel Sensing Intrinsic Amplitude: 12.625 mV
Lead Channel Sensing Intrinsic Amplitude: 13.875 mV
Lead Channel Setting Pacing Amplitude: 2.5 V
Lead Channel Setting Pacing Amplitude: 2.5 V
Lead Channel Setting Pacing Amplitude: 3.75 V
Lead Channel Setting Pacing Pulse Width: 0.4 ms
Lead Channel Setting Pacing Pulse Width: 1.2 ms
Lead Channel Setting Sensing Sensitivity: 0.9 mV

## 2020-09-04 NOTE — Progress Notes (Signed)
Remote pacemaker transmission.   

## 2020-09-06 ENCOUNTER — Other Ambulatory Visit: Payer: Medicare Other | Admitting: *Deleted

## 2020-09-06 ENCOUNTER — Other Ambulatory Visit: Payer: Self-pay

## 2020-09-06 DIAGNOSIS — E032 Hypothyroidism due to medicaments and other exogenous substances: Secondary | ICD-10-CM | POA: Diagnosis not present

## 2020-09-06 LAB — TSH: TSH: 2.28 u[IU]/mL (ref 0.450–4.500)

## 2020-09-11 ENCOUNTER — Ambulatory Visit: Payer: Medicare Other | Admitting: Internal Medicine

## 2020-09-11 ENCOUNTER — Encounter: Payer: Self-pay | Admitting: Internal Medicine

## 2020-09-11 ENCOUNTER — Other Ambulatory Visit: Payer: Self-pay

## 2020-09-11 VITALS — BP 114/60 | HR 75 | Ht 64.0 in | Wt 132.8 lb

## 2020-09-11 DIAGNOSIS — I495 Sick sinus syndrome: Secondary | ICD-10-CM

## 2020-09-11 DIAGNOSIS — R42 Dizziness and giddiness: Secondary | ICD-10-CM

## 2020-09-11 DIAGNOSIS — I4819 Other persistent atrial fibrillation: Secondary | ICD-10-CM | POA: Diagnosis not present

## 2020-09-11 DIAGNOSIS — I5021 Acute systolic (congestive) heart failure: Secondary | ICD-10-CM

## 2020-09-11 DIAGNOSIS — Z950 Presence of cardiac pacemaker: Secondary | ICD-10-CM | POA: Diagnosis not present

## 2020-09-11 NOTE — Patient Instructions (Signed)
Medication Instructions:  Your physician recommends that you continue on your current medications as directed. Please refer to the Current Medication list given to you today.  *If you need a refill on your cardiac medications before your next appointment, please call your pharmacy*   Lab Work: None ordered.  If you have labs (blood work) drawn today and your tests are completely normal, you will receive your results only by: . MyChart Message (if you have MyChart) OR . A paper copy in the mail If you have any lab test that is abnormal or we need to change your treatment, we will call you to review the results.   Testing/Procedures: None ordered.    Follow-Up: At [facility], you and your health needs are our priority.  As part of our continuing mission to provide you with exceptional heart care, we have created designated Provider Care Teams.  These Care Teams include your primary Cardiologist (physician) and Advanced Practice Providers (APPs -  Physician Assistants and Nurse Practitioners) who all work together to provide you with the care you need, when you need it.  We recommend signing up for the patient portal called "MyChart".  Sign up information is provided on this After Visit Summary.  MyChart is used to connect with patients for Virtual Visits (Telemedicine).  Patients are able to view lab/test results, encounter notes, upcoming appointments, etc.  Non-urgent messages can be sent to your provider as well.   To learn more about what you can do with MyChart, go to [email or web address].    Your next appointment:   9 months  The format for your next appointment:   In Person  Provider:   Steven Klein, MD     

## 2020-09-11 NOTE — Progress Notes (Signed)
Patient Care Team: Chesley Noon, MD as PCP - General (Family Medicine) Deboraha Sprang, MD as PCP - Cardiology (Cardiology)   HPI  [name] is a 86 y.o. female in followup for CRT-P upgrade 7/21, cx by pocket hematoma    Breathlessness is much better.  Lightheadedness with standing is improved.  No edema.  No nocturnal dyspnea orthopnea.  Does complain of fatigue.  Takes amiodarone for afib  On Anticoagulation;  No bleeding issues     Date TSH LFTs Cr K Hgb  1/18  1.14      14  5/18    1.63    8/18 1.24(12/18)  - 21 1.47  11.8 (4/18)  1/19  37 1.00 3.4 11.3  12/19 1.65 14 1.51>1.15 4.2 13.0  3/20 0.951 25 1.31 3.4 11.6  1/21 1.42 16 1.90 3.9   4/21   14 2.08 3.7 12.1  7/21 0.046  1.7 3.6 11.4  10/21 2.28   he was well happy birthday        DATE TEST EF   2/18 Echo   60-65 % Eccentric MR-mild  12/18  Echo  55-60%   3/20 TEE 50-55% PA sys 56/LAE  4/21 Echo  25%   5/21 Myoview 35% No ischemia  9/21 Echo  55-60% LAE   Thromboembolic risk factors ( age  -2, HTN-1, CHF-1, Gender-1) for a CHADSVASc Score of >=5   Records and Results Reviewed   Past Medical History:  Diagnosis Date   Anxiety    Aortic valve sclerosis    Atrial fibrillation (HCC)    Basal cell carcinoma    hx; near eyes   Chronic systolic CHF (congestive heart failure) ([place])    Echocardiogram 03/2020: EF 25-30, mid-dist lat, dist ant, mid-dist inf, mid-dist septal and apical severe HK/AK, Gr 2 DD, mild reduced RVSF, RVSP 41.8, severe LAE, mod RAE, mild to mod MR, mild to mod TR, AoV sclerosis (no AS)   GERD (gastroesophageal reflux disease)    H/O one miscarriage    Hard of hearing    has hearing aides but does not wear    History of hiatal hernia    History of kidney stones    Hyperlipidemia    Hypertension    OA (osteoarthritis)    "right pointer; right hip" (01/05/2017)   Obesity    Renal insufficiency    SSS (sick sinus syndrome) ([place])  01/05/2017   a. s/p MDT dual chamber PPM    Tinnitus    Varicose veins    Wears glasses     Past Surgical History:  Procedure Laterality Date   BIV UPGRADE N/A 05/30/2020   Procedure: BIV PPM UPGRADE;  Surgeon: Deboraha Sprang, MD;  Location: [place] [facility];  Service: Cardiovascular;  Laterality: N/A;   BREAST CYST EXCISION Right 1977   "benign"   CARDIOVERSION N/A 07/25/2016   Procedure: CARDIOVERSION;  Surgeon: Lelon Perla, MD;  Location: [facility] ENDOSCOPY;  Service: Cardiovascular;  Laterality: N/A;   CARDIOVERSION N/A 01/20/2019   Procedure: CARDIOVERSION;  Surgeon: Buford Dresser, MD;  Location: [facility] ENDOSCOPY;  Service: Cardiovascular;  Laterality: N/A;   CARDIOVERSION N/A 12/05/2019   Procedure: CARDIOVERSION;  Surgeon: Dorothy Spark, MD;  Location: [facility] ENDOSCOPY;  Service: Cardiovascular;  Laterality: N/A;   CARDIOVERSION N/A 05/07/2020   Procedure: CARDIOVERSION;  Surgeon: Acie Fredrickson Wonda Cheng, MD;  Location: [place];  Service: Cardiovascular;  Laterality: N/A;   CATARACT EXTRACTION W/ INTRAOCULAR LENS  IMPLANT, BILATERAL Bilateral    DILATION AND CURETTAGE OF UTERUS     NASAL SEPTUM SURGERY     OVARIAN CYST REMOVAL  2002   PACEMAKER IMPLANT N/A 01/05/2017   Procedure: Pacemaker Implant;  Surgeon: Will Meredith Leeds, MD;  Location: [place] [facility];  Service: Cardiovascular;  Laterality: N/A;   TEE WITHOUT CARDIOVERSION N/A 12/16/2016   Procedure: TRANSESOPHAGEAL ECHOCARDIOGRAM (TEE);  Surgeon: Larey Dresser, MD;  Location[facility];  Service: Cardiovascular;  Laterality: N/A;   TEE WITHOUT CARDIOVERSION N/A 01/20/2019   Procedure: TRANSESOPHAGEAL ECHOCARDIOGRAM (TEE);  Surgeon: Buford Dresser, MD;  Location: [place];  Service: Cardiovascular;  Laterality: N/A;   TONSILLECTOMY     TOTAL HIP ARTHROPLASTY Left 12/18/2015   Procedure: LEFT TOTAL HIP ARTHROPLASTY ANTERIOR APPROACH;  Surgeon: Paralee Cancel, MD;  [facility]: [facility];   Service: Orthopedics;  Laterality: Left;    Current Meds  Medication Sig   acetaminophen (TYLENOL) 500 MG tablet Take 1,000 mg by mouth at bedtime.    ALPRAZolam (XANAX) 0.5 MG tablet Take 0.5 mg by mouth at bedtime.    amiodarone (PACERONE) 200 MG tablet Take 0.5 tablets (100 mg total) by mouth daily.   busPIRone (BUSPAR) 5 MG tablet Take 5 mg by mouth 2 (two) times daily as needed (for stress/anxiety).    carvedilol (COREG) 6.25 MG tablet Take 1 tablet (6.25 mg total) by mouth 2 (two) times daily.   Cyanocobalamin (B-12) 2500 MCG TABS Take 2,500 mcg by mouth every other day.    furosemide (LASIX) 40 MG tablet Take 40 mg by mouth in the morning, 20 mg by mouth in the evening.   Magnesium 400 MG CAPS Take 400 mg by mouth 3 (three) times a week.    Multiple Vitamin (MULTIVITAMIN WITH MINERALS) TABS tablet Take 1 tablet by mouth daily.   Polyethyl Glycol-Propyl Glycol (SYSTANE OP) Place 1 drop into both eyes daily as needed (dry eyes).   Potassium 99 MG TABS Take 2 tablets by mouth daily.    XARELTO 15 MG TABS tablet TAKE 1 TABLET(15 MG) BY MOUTH DAILY WITH SUPPER    Allergies  Allergen Reactions   Ace Inhibitors Cough and Other (See Comments)    Pt unsure    Amlodipine Other (See Comments)    Higher doses Higher doses = VERY BAD COUGHING   Celebrex [Celecoxib] Other (See Comments)    Reflux   Codeine Nausea Only   Rofecoxib Other (See Comments) and Swelling    Pt unsure of reaction Pt unsure  Pt unsure of reaction   Ether For Anesthesia [Ether]     Burning and dry cough   Norvasc [Amlodipine Besylate] Cough    Higher doses = VERY BAD COUGHING       Review of Systems negative except from HPI and PMH  Physical Exam BP 114/60    Pulse 75    Ht 5\' 4"  (1.626 m)    Wt 132 lb 12.8 oz (60.2 kg)    SpO2 95%    BMI 22.80 kg/m  Well developed and well nourished in no acute distress HENT normal Neck supple with JVP-flat Clear Device pocket well healed; without  hematoma or erythema.  There is no tethering  Regular rate and rhythm, no  murmur Abd-soft with active BS No Clubbing cyanosis  edema Skin-warm and dry A & Oriented  Grossly normal sensory and motor function  ECG AV pacing with upright QRS lead V1 and qRs in lead 1  CrCl cannot be  calculated (Patient's most recent lab result is older than the maximum 21 days allowed.).   Assessment and  Plan  Orthostatic lightheadedness  Atrial fibrillation-persistent  High-grade heart block  High risk medication surveillance-amiodarone  Hyperthyroidism  Anemia  Nonischemic cardiomyopathy-question pacemaker  No interval atrial fibrillation.  Euvolemic.  Functional status much improved although still with some limitations of dyspnea.  Tolerating amiodarone-- surveillance labs uptodate x LFTs will check in 1/*22  OI improved   Anemia  cgheck Hgb at next visit

## 2020-09-12 ENCOUNTER — Other Ambulatory Visit: Payer: Self-pay | Admitting: Physician Assistant

## 2020-09-24 DIAGNOSIS — I10 Essential (primary) hypertension: Secondary | ICD-10-CM | POA: Diagnosis not present

## 2020-10-02 DIAGNOSIS — I10 Essential (primary) hypertension: Secondary | ICD-10-CM | POA: Diagnosis not present

## 2020-10-02 DIAGNOSIS — N184 Chronic kidney disease, stage 4 (severe): Secondary | ICD-10-CM | POA: Diagnosis not present

## 2020-10-03 NOTE — Telephone Encounter (Signed)
I called patient.  She reports she mentioned to her daughter last night she was feeling dizzy.  Patient states last evening her BP was 106/60's, heart rate 64.  She was feeling off balance.  She started feeling better and BP was back to normal at bedtime.  She did not record BP but said it was in the 120's.  Today she is feeling fine.  She reports she will occasionally have episodes of feeling off balance.  Usually occurs when standing up.  Last episode was about 10 days ago.  She doesn't always check her BP during these episodes.  Patient reports overall she is feeling much better since last office visit with Dr Caryl Comes.  Patient with known orthostatic lightheadedness.  I discussed with patient the importance of changing positions slowly.  I asked her to continue to monitor BP and let us know if  consistently lower than normal.  She will also call if dizziness increases.

## 2020-10-16 DIAGNOSIS — I10 Essential (primary) hypertension: Secondary | ICD-10-CM | POA: Diagnosis not present

## 2020-10-17 ENCOUNTER — Telehealth: Payer: Self-pay | Admitting: Internal Medicine

## 2020-10-17 NOTE — Telephone Encounter (Signed)
Pt c/o medication issue:  1. Name of Medication: furosemide (LASIX) 40 MG tablet  2. How are you currently taking this medication (dosage and times per day)? 40 mg in the morning, 20 mg every other night totaling 60 mg every other day  3. Are you having a reaction (difficulty breathing--STAT)?   4. What is your medication issue? Patient initially called to see if Dr. Caryl Comes was OK with the dosage change of the medication that her PCP made of the Lasix. The patient said her PCP made the change because she had labs done and she was dehydrated.  Patient then mentioned that she was having SOB and swelling. Patient said the last time she had any significant swelling she ended up in the hospital. Patient is not sure what Dr. Caryl Comes wants her to do.   Pt c/o Shortness Of Breath: STAT if SOB developed within the last 24 hours or pt is noticeably SOB on the phone  1. Are you currently SOB (can you hear that pt is SOB on the phone)? no  2. How long have you been experiencing SOB? A while  3. Are you SOB when sitting or when up moving around? With movement   4. Are you currently experiencing any other symptoms? Swelling in her feet and ankles   Pt c/o swelling: STAT is pt has developed SOB within 24 hours  1) How much weight have you gained and in what time span? No wt gain. Pt says  She has been at 140 lbs for a while  2) If swelling, where is the swelling located? Feet and ankles  3) Are you currently taking a fluid pill? Yes, 40-60 mg of lasix. See notes above  4) Are you currently SOB? Not at time of call. Pt says SOB comes and goes  5) Do you have a log of your daily weights (if so, list)? no  6) Have you gained 3 pounds in a day or 5 pounds in a week? no  7) Have you traveled recently? no

## 2020-10-17 NOTE — Telephone Encounter (Signed)
Attempted to call patient back x2 regarding medication questions/SOB. Line was busy. Unable to leave a message.

## 2020-10-18 NOTE — Telephone Encounter (Signed)
Spoke with pt's daughter, Butch Penny, [place] and advised per Dr Olin Pia VO pt is to begin Lasix 60mg  qam (3 - 20mg  tablets by mouth) x 3 days then 60mg  (3- 20mg  tablets) by mouth every other day. And repeat BMET in 2 weeks scheduled for 11/01/2020 at 830am.  Pt's daughter to contact office if symptoms increase or do not improve.  Pt's daughter verbalizes understanding and agrees with current plan.

## 2020-10-29 ENCOUNTER — Telehealth: Payer: Self-pay | Admitting: Internal Medicine

## 2020-10-29 NOTE — Telephone Encounter (Signed)
See MyChart message for details.  

## 2020-10-29 NOTE — Telephone Encounter (Signed)
Pt c/o medication issue:  1. Name of Medication: furosemide (LASIX) 40 MG tablet [[id number]]    2. How are you currently taking this medication (dosage and times per day)?  Take 40 mg by mouth in the morning, 20 mg by mouth in the evening.  3. Are you having a reaction (difficulty breathing--STAT)? No  4. What is your medication issue? Daughter called in and stated pt is suppose to stop this med on the 20th but she is still swollen. Daughter would like to know if it would be a good idea to stop this med if she is still swollen

## 2020-11-01 ENCOUNTER — Other Ambulatory Visit: Payer: Medicare Other

## 2020-11-01 ENCOUNTER — Other Ambulatory Visit: Payer: Self-pay

## 2020-11-01 ENCOUNTER — Ambulatory Visit: Payer: Medicare Other | Admitting: Student

## 2020-11-01 ENCOUNTER — Encounter: Payer: Self-pay | Admitting: Student

## 2020-11-01 VITALS — BP 112/60 | Ht 64.0 in | Wt 150.0 lb

## 2020-11-01 DIAGNOSIS — I495 Sick sinus syndrome: Secondary | ICD-10-CM

## 2020-11-01 DIAGNOSIS — I4819 Other persistent atrial fibrillation: Secondary | ICD-10-CM

## 2020-11-01 DIAGNOSIS — I442 Atrioventricular block, complete: Secondary | ICD-10-CM

## 2020-11-01 DIAGNOSIS — Z950 Presence of cardiac pacemaker: Secondary | ICD-10-CM | POA: Diagnosis not present

## 2020-11-01 DIAGNOSIS — I5021 Acute systolic (congestive) heart failure: Secondary | ICD-10-CM

## 2020-11-01 LAB — CUP PACEART INCLINIC DEVICE CHECK
Battery Remaining Longevity: 58 mo
Battery Voltage: 2.99 V
Brady Statistic AP VP Percent: 97.43 %
Brady Statistic AP VS Percent: 0.12 %
Brady Statistic AS VP Percent: 2.15 %
Brady Statistic AS VS Percent: 0.3 %
Brady Statistic RA Percent Paced: 97.61 %
Brady Statistic RV Percent Paced: 99.33 %
Date Time Interrogation Session: 20211223122939
Implantable Lead Implant Date: 20180226
Implantable Lead Implant Date: 20180226
Implantable Lead Implant Date: 20210721
Implantable Lead Location: 753858
Implantable Lead Location: 753859
Implantable Lead Location: 753860
Implantable Lead Model: 5076
Implantable Lead Model: 5076
Implantable Pulse Generator Implant Date: 20210721
Lead Channel Impedance Value: 1045 Ohm
Lead Channel Impedance Value: 1102 Ohm
Lead Channel Impedance Value: 1216 Ohm
Lead Channel Impedance Value: 266 Ohm
Lead Channel Impedance Value: 361 Ohm
Lead Channel Impedance Value: 399 Ohm
Lead Channel Impedance Value: 399 Ohm
Lead Channel Impedance Value: 437 Ohm
Lead Channel Impedance Value: 494 Ohm
Lead Channel Impedance Value: 608 Ohm
Lead Channel Impedance Value: 703 Ohm
Lead Channel Impedance Value: 798 Ohm
Lead Channel Impedance Value: 931 Ohm
Lead Channel Impedance Value: 969 Ohm
Lead Channel Pacing Threshold Amplitude: 0.75 V
Lead Channel Pacing Threshold Amplitude: 1 V
Lead Channel Pacing Threshold Amplitude: 3.75 V
Lead Channel Pacing Threshold Pulse Width: 0.4 ms
Lead Channel Pacing Threshold Pulse Width: 0.4 ms
Lead Channel Pacing Threshold Pulse Width: 0.8 ms
Lead Channel Sensing Intrinsic Amplitude: 1.375 mV
Lead Channel Sensing Intrinsic Amplitude: 1.75 mV
Lead Channel Sensing Intrinsic Amplitude: 12.75 mV
Lead Channel Sensing Intrinsic Amplitude: 13.375 mV
Lead Channel Setting Pacing Amplitude: 2.5 V
Lead Channel Setting Pacing Amplitude: 2.5 V
Lead Channel Setting Pacing Amplitude: 3.5 V
Lead Channel Setting Pacing Pulse Width: 0.4 ms
Lead Channel Setting Pacing Pulse Width: 1.2 ms
Lead Channel Setting Sensing Sensitivity: 0.9 mV

## 2020-11-01 MED ORDER — FUROSEMIDE 40 MG PO TABS: 60.0000 mg | ORAL_TABLET | Freq: Every day | ORAL | 3 refills | Status: DC

## 2020-11-01 NOTE — Patient Instructions (Addendum)
Medication Instructions:  Your physician has recommended you make the following change in your medication:  -- Take Furosemide (Lasix) 80 mg by mouth DAILY for 3 DAYS, the decrease to 1.5 tablets (60 mg) by mouth daily -- NEW RX SENT *If you need a refill on your cardiac medications before your next appointment, please call your pharmacy*  Lab Work: Your physician has recommended that you have lab work today: BMET Your physician recommends that you return for lab work in: BMET  If you have labs (blood work) drawn today and your tests are completely normal, you will receive your results only by: Marland Kitchen MyChart Message (if you have MyChart) OR . A paper copy in the mail If you have any lab test that is abnormal or we need to change your treatment, we will call you to review the results.  Follow-Up: At [facility], you and your health needs are our priority.  As part of our continuing mission to provide you with exceptional heart care, we have created designated Provider Care Teams.  These Care Teams include your primary Cardiologist (physician) and Advanced Practice Providers (APPs -  Physician Assistants and Nurse Practitioners) who all work together to provide you with the care you need, when you need it.  We recommend signing up for the patient portal called "MyChart".  Sign up information is provided on this After Visit Summary.  MyChart is used to connect with patients for Virtual Visits (Telemedicine).  Patients are able to view lab/test results, encounter notes, upcoming appointments, etc.  Non-urgent messages can be sent to your provider as well.   To learn more about what you can do with MyChart, go to [email or web address].    Your next appointment:   Your physician recommends that you keep your scheduled follow-up appointment in February 2022 with Oda Kilts, PA-C  The format for your next appointment:   In Person with Legrand Como "Jonni Sanger" Chalmers Cater, PA-C

## 2020-11-01 NOTE — Progress Notes (Signed)
Electrophysiology Office Note Date: 11/01/2020  ID:  BIRD TAILOR, DOB 06-30-1930, MRN [id number]  PCP: Chesley Noon, MD Primary Cardiologist: Virl Axe, MD Electrophysiologist: Virl Axe, MD   CC: Pacemaker follow-up  [name] is a 86 y.o. female seen today for Virl Axe, MD for acute visit due to peripheral edema.  Since last being seen in our clinic the patient reports doing overall OK. Daughter is present with her. She has been having worening peripheral edema over approximately the last month. Has adjusted lasix back and forth. Currently taking lasix 40 meq q am and 20 meq q pm. she denies chest pain, palpitations, dyspnea, PND, orthopnea, nausea, vomiting, dizziness, syncope, edema, weight gain, or early satiety.  Device History: Medtronic DDD PPM implanted 12/2016, upgraded to BIV 05/2020 for CHB / NICM  Past Medical History:  Diagnosis Date  . Anxiety   . Aortic valve sclerosis   . Atrial fibrillation ([place])   . Basal cell carcinoma    hx; near eyes  . Chronic systolic CHF (congestive heart failure) (HCC)    Echocardiogram 03/2020: EF 25-30, mid-dist lat, dist ant, mid-dist inf, mid-dist septal and apical severe HK/AK, Gr 2 DD, mild reduced RVSF, RVSP 41.8, severe LAE, mod RAE, mild to mod MR, mild to mod TR, AoV sclerosis (no AS)  . GERD (gastroesophageal reflux disease)   . H/O one miscarriage   . Hard of hearing    has hearing aides but does not wear   . History of hiatal hernia   . History of kidney stones   . Hyperlipidemia   . Hypertension   . OA (osteoarthritis)    "right pointer; right hip" (01/05/2017)  . Obesity   . Renal insufficiency   . SSS (sick sinus syndrome) ([place]) 01/05/2017   a. s/p MDT dual chamber PPM   . Tinnitus   . Varicose veins   . Wears glasses    Past Surgical History:  Procedure Laterality Date  . BIV UPGRADE N/A 05/30/2020   Procedure: BIV PPM UPGRADE;  Surgeon: Deboraha Sprang, MD;  Location: [place] [facility];   Service: Cardiovascular;  Laterality: N/A;  . BREAST CYST EXCISION Right 1977   "benign"  . CARDIOVERSION N/A 07/25/2016   Procedure: CARDIOVERSION;  Surgeon: Lelon Perla, MD;  Location: [facility] ENDOSCOPY;  Service: Cardiovascular;  Laterality: N/A;  . CARDIOVERSION N/A 01/20/2019   Procedure: CARDIOVERSION;  Surgeon: Buford Dresser, MD;  Location: [facility] ENDOSCOPY;  Service: Cardiovascular;  Laterality: N/A;  . CARDIOVERSION N/A 12/05/2019   Procedure: CARDIOVERSION;  Surgeon: Dorothy Spark, MD;  Location: [facility] ENDOSCOPY;  Service: Cardiovascular;  Laterality: N/A;  . CARDIOVERSION N/A 05/07/2020   Procedure: CARDIOVERSION;  Surgeon: Acie Fredrickson Wonda Cheng, MD;  Location: [place];  Service: Cardiovascular;  Laterality: N/A;  . CATARACT EXTRACTION W/ INTRAOCULAR LENS  IMPLANT, BILATERAL Bilateral   . DILATION AND CURETTAGE OF UTERUS    . NASAL SEPTUM SURGERY    . OVARIAN CYST REMOVAL  2002  . PACEMAKER IMPLANT N/A 01/05/2017   Procedure: Pacemaker Implant;  Surgeon: Will Meredith Leeds, MD;  Location: [place] [facility];  Service: Cardiovascular;  Laterality: N/A;  . TEE WITHOUT CARDIOVERSION N/A 12/16/2016   Procedure: TRANSESOPHAGEAL ECHOCARDIOGRAM (TEE);  Surgeon: Larey Dresser, MD;  Location: [place];  Service: Cardiovascular;  Laterality: N/A;  . TEE WITHOUT CARDIOVERSION N/A 01/20/2019   Procedure: TRANSESOPHAGEAL ECHOCARDIOGRAM (TEE);  Surgeon: Buford Dresser, MD;  Location: [place];  Service: Cardiovascular;  Laterality:  N/A;  . TONSILLECTOMY    . TOTAL HIP ARTHROPLASTY Left 12/18/2015   Procedure: LEFT TOTAL HIP ARTHROPLASTY ANTERIOR APPROACH;  Surgeon: Paralee Cancel, MD;  [facility];  Service: Orthopedics;  Laterality: Left;    Current Outpatient Medications  Medication Sig Dispense Refill  . acetaminophen (TYLENOL) 500 MG tablet Take 1,000 mg by mouth at bedtime.    . ALPRAZolam (XANAX) 0.5 MG tablet Take 0.5 mg by mouth at bedtime.    Marland Kitchen amiodarone  (PACERONE) 200 MG tablet Take 0.5 tablets (100 mg total) by mouth daily. 90 tablet 0  . busPIRone (BUSPAR) 5 MG tablet Take 5 mg by mouth 2 (two) times daily as needed (for stress/anxiety).     . carvedilol (COREG) 6.25 MG tablet Take 1 tablet (6.25 mg total) by mouth 2 (two) times daily. 180 tablet 3  . Cyanocobalamin (B-12) 2500 MCG TABS Take 2,500 mcg by mouth every other day.     . Magnesium 400 MG CAPS Take 400 mg by mouth 3 (three) times a week.     . Multiple Vitamin (MULTIVITAMIN WITH MINERALS) TABS tablet Take 1 tablet by mouth daily.    . Potassium 99 MG TABS Take 2 tablets by mouth daily.     Alveda Reasons 15 MG TABS tablet TAKE 1 TABLET(15 MG) BY MOUTH DAILY WITH SUPPER 30 tablet 5  . furosemide (LASIX) 40 MG tablet Take 1.5 tablets (60 mg total) by mouth daily. 135 tablet 3   No current facility-administered medications for this visit.    Allergies:   Ace inhibitors, Amlodipine, Celebrex [celecoxib], Codeine, Rofecoxib, Ether for anesthesia [ether], and Norvasc [amlodipine besylate]   Social History: Social History   Socioeconomic History  . Marital status: Married    Spouse name: Not on file  . Number of children: Not on file  . Years of education: Not on file  . Highest education level: Not on file  Occupational History  . Not on file  Tobacco Use  . Smoking status: Never Smoker  . Smokeless tobacco: Never Used  Substance and Sexual Activity  . Alcohol use: No  . Drug use: No  . Sexual activity: Not Currently  Other Topics Concern  . Not on file  Social History Narrative  . Not on file   Social Determinants of Health   Financial Resource Strain: Not on file  Food Insecurity: Not on file  Transportation Needs: Not on file  Physical Activity: Not on file  Stress: Not on file  Social Connections: Not on file  Intimate Partner Violence: Not on file    Family History: Family History  Problem Relation Age of Onset  . Diabetes Mother   . Heart disease Father    . Hypertension Sister   . Hypertension Brother      Review of Systems: All other systems reviewed and are otherwise negative except as noted above.  Physical Exam: Vitals:   11/01/20 1003  BP: 112/60  SpO2: 97%  Weight: 150 lb (68 kg)  Height: 5\' 4"  (1.626 m)     GEN- The patient is well appearing, alert and oriented x 3 today.   HEENT: normocephalic, atraumatic; sclera clear, conjunctiva pink; hearing intact; oropharynx clear; neck supple  Lungs- Clear to ausculation bilaterally, normal work of breathing.  No wheezes, rales, rhonchi Heart- Regular rate and rhythm, no murmurs, rubs or gallops  GI- soft, non-tender, non-distended, bowel sounds present  Extremities- no clubbing or cyanosis. No edema MS- no significant deformity or atrophy Skin-  warm and dry, no rash or lesion; PPM pocket well healed Psych- euthymic mood, full affect Neuro- strength and sensation are intact  PPM Interrogation- reviewed in detail today,  See PACEART report  EKG:  EKG is not ordered today.  Recent Labs: 03/02/2020: ALT 14; NT-Pro BNP 3,278 05/25/2020: BUN 32; Creatinine, Ser 1.70; Hemoglobin 11.4; Platelets 201; Potassium 3.6; Sodium 141 09/06/2020: TSH 2.280   Wt Readings from Last 3 Encounters:  11/01/20 150 lb (68 kg)  09/11/20 132 lb 12.8 oz (60.2 kg)  08/07/20 137 lb (62.1 kg)     Other studies Reviewed: Additional studies/ records that were reviewed today include: Previous EP office notes, Previous remote checks, Most recent labwork.   Assessment and Plan:  1. Symptomatic bradycardia s/p Medtronic PPM -> BIV upgrade Normal PPM function See Pace Art report No changes today  2. Acute on chronic systolic CHF Echo 5/49/8264 with newly reduced EF at 25-30% (TEE 01/2019 with normal EF) EF improved within 2 months of LV lead addition, EF 55-60% 07/2020 NYHA II-III symptoms Volume status elevated on exam today.  Take lasix 80 mg daily x 3 days, then lasix 60 mg daily.  Continue coreg  6.25 mg BID Off hydralazine/ imdur with hypotension.  Avoid ACE/ARB with CKD Would avoid digoxin with CKD  3. CKD III-IV Baseline appears ~1.7-1.9. BMET today with lasix adjustment.  No spiro. Avoid digoxin Would only use ACE/ARB/ARNI very cautiously. For now, substituting for imdur/hydralazine for afterload reduction.   4. Persistent AF Continue amiodarone.  TSH stable 09/06/2020  Current medicines are reviewed at length with the patient today.   The patient does not have concerns regarding her medicines.  The following changes were made today:  Take lasix 80 mg daily x 3 days, then lasix 60 mg daily.   Labs/ tests ordered today include:  Orders Placed This Encounter  Procedures  . Basic Metabolic Panel (BMET)  . CUP PACEART INCLINIC DEVICE CHECK    Disposition:   Follow up with EP APP in 4-6 Weeks   Signed, Annamaria Helling  11/01/2020 12:41 PM  [place] [address] [phone] (office) [phone] (fax)

## 2020-11-02 LAB — BASIC METABOLIC PANEL
BUN/Creatinine Ratio: 21 (ref 12–28)
BUN: 50 mg/dL — ABNORMAL HIGH (ref 8–27)
CO2: 20 mmol/L (ref 20–29)
Calcium: 9.6 mg/dL (ref 8.7–10.3)
Chloride: 98 mmol/L (ref 96–106)
Creatinine, Ser: 2.41 mg/dL — ABNORMAL HIGH (ref 0.57–1.00)
GFR calc Af Amer: 20 mL/min/{1.73_m2} — ABNORMAL LOW (ref >59)
GFR calc non Af Amer: 17 mL/min/{1.73_m2} — ABNORMAL LOW (ref >59)
Glucose: 109 mg/dL — ABNORMAL HIGH (ref 65–99)
Potassium: 4.3 mmol/L (ref 3.5–5.2)
Sodium: 140 mmol/L (ref 134–144)

## 2020-11-05 ENCOUNTER — Telehealth: Payer: Self-pay | Admitting: Internal Medicine

## 2020-11-05 NOTE — Telephone Encounter (Signed)
New message:    Patient daughter calling stating that the patient is on lasix. Patient daughter states that her mother is not urinating much at all. Please call the patient daughter.

## 2020-11-05 NOTE — Telephone Encounter (Signed)
Mary Sprang, MD to Me      4:26 PM [name]. Hope u had a great time with your family  This lady's bunCr are really high suggesting she is renally dry and if she has edema than .Marland Kitchen She probably should go to hospital. [name] with daughter who is asking about being able to take Metolazone.  Pt's sister takes this along with her Furosemide and it helps her urine output.  Advised daughter since kidney function is already elevated per Dr Caryl Comes, if pt is having edema she should go to the ED for further eval and treatment.  Daughter states understanding.  She will call back with any further questions/concerns.

## 2020-11-06 ENCOUNTER — Other Ambulatory Visit: Payer: Self-pay

## 2020-11-06 ENCOUNTER — Inpatient Hospital Stay (HOSPITAL_COMMUNITY)
Admission: EM | Admit: 2020-11-06 | Discharge: 2020-11-21 | DRG: 291 | Disposition: A | Discharge status: Home Health | Payer: Medicare Other | Attending: Family Medicine | Admitting: Family Medicine

## 2020-11-06 ENCOUNTER — Emergency Department (HOSPITAL_COMMUNITY): Payer: Medicare Other

## 2020-11-06 ENCOUNTER — Encounter (HOSPITAL_COMMUNITY): Payer: Self-pay | Admitting: Emergency Medicine

## 2020-11-06 ENCOUNTER — Telehealth: Payer: Self-pay

## 2020-11-06 DIAGNOSIS — N390 Urinary tract infection, site not specified: Secondary | ICD-10-CM | POA: Diagnosis not present

## 2020-11-06 DIAGNOSIS — I5043 Acute on chronic combined systolic (congestive) and diastolic (congestive) heart failure: Secondary | ICD-10-CM | POA: Diagnosis not present

## 2020-11-06 DIAGNOSIS — J90 Pleural effusion, not elsewhere classified: Secondary | ICD-10-CM | POA: Diagnosis not present

## 2020-11-06 DIAGNOSIS — E876 Hypokalemia: Secondary | ICD-10-CM | POA: Diagnosis not present

## 2020-11-06 DIAGNOSIS — N1832 Chronic kidney disease, stage 3b: Secondary | ICD-10-CM | POA: Diagnosis present

## 2020-11-06 DIAGNOSIS — I5023 Acute on chronic systolic (congestive) heart failure: Secondary | ICD-10-CM | POA: Diagnosis not present

## 2020-11-06 DIAGNOSIS — Z7901 Long term (current) use of anticoagulants: Secondary | ICD-10-CM | POA: Diagnosis not present

## 2020-11-06 DIAGNOSIS — J918 Pleural effusion in other conditions classified elsewhere: Secondary | ICD-10-CM | POA: Diagnosis not present

## 2020-11-06 DIAGNOSIS — Z79899 Other long term (current) drug therapy: Secondary | ICD-10-CM | POA: Diagnosis not present

## 2020-11-06 DIAGNOSIS — J9811 Atelectasis: Secondary | ICD-10-CM | POA: Diagnosis not present

## 2020-11-06 DIAGNOSIS — Z85828 Personal history of other malignant neoplasm of skin: Secondary | ICD-10-CM | POA: Diagnosis not present

## 2020-11-06 DIAGNOSIS — N183 Chronic kidney disease, stage 3 unspecified: Secondary | ICD-10-CM | POA: Diagnosis present

## 2020-11-06 DIAGNOSIS — Z950 Presence of cardiac pacemaker: Secondary | ICD-10-CM

## 2020-11-06 DIAGNOSIS — Z96642 Presence of left artificial hip joint: Secondary | ICD-10-CM | POA: Diagnosis present

## 2020-11-06 DIAGNOSIS — E0580 Other thyrotoxicosis without thyrotoxic crisis or storm: Secondary | ICD-10-CM | POA: Diagnosis present

## 2020-11-06 DIAGNOSIS — I428 Other cardiomyopathies: Secondary | ICD-10-CM | POA: Diagnosis not present

## 2020-11-06 DIAGNOSIS — L8990 Pressure ulcer of unspecified site, unspecified stage: Secondary | ICD-10-CM | POA: Insufficient documentation

## 2020-11-06 DIAGNOSIS — Z20822 Contact with and (suspected) exposure to covid-19: Secondary | ICD-10-CM | POA: Diagnosis not present

## 2020-11-06 DIAGNOSIS — R0602 Shortness of breath: Secondary | ICD-10-CM

## 2020-11-06 DIAGNOSIS — Z9889 Other specified postprocedural states: Secondary | ICD-10-CM

## 2020-11-06 DIAGNOSIS — I10 Essential (primary) hypertension: Secondary | ICD-10-CM

## 2020-11-06 DIAGNOSIS — R0600 Dyspnea, unspecified: Secondary | ICD-10-CM | POA: Diagnosis not present

## 2020-11-06 DIAGNOSIS — I509 Heart failure, unspecified: Secondary | ICD-10-CM

## 2020-11-06 DIAGNOSIS — I130 Hypertensive heart and chronic kidney disease with heart failure and stage 1 through stage 4 chronic kidney disease, or unspecified chronic kidney disease: Secondary | ICD-10-CM | POA: Diagnosis not present

## 2020-11-06 DIAGNOSIS — I517 Cardiomegaly: Secondary | ICD-10-CM | POA: Diagnosis not present

## 2020-11-06 DIAGNOSIS — D6959 Other secondary thrombocytopenia: Secondary | ICD-10-CM | POA: Diagnosis not present

## 2020-11-06 DIAGNOSIS — N179 Acute kidney failure, unspecified: Secondary | ICD-10-CM | POA: Diagnosis not present

## 2020-11-06 DIAGNOSIS — E785 Hyperlipidemia, unspecified: Secondary | ICD-10-CM | POA: Diagnosis present

## 2020-11-06 DIAGNOSIS — D6489 Other specified anemias: Secondary | ICD-10-CM | POA: Diagnosis present

## 2020-11-06 DIAGNOSIS — I480 Paroxysmal atrial fibrillation: Secondary | ICD-10-CM | POA: Diagnosis not present

## 2020-11-06 DIAGNOSIS — I495 Sick sinus syndrome: Secondary | ICD-10-CM | POA: Diagnosis not present

## 2020-11-06 DIAGNOSIS — I5040 Unspecified combined systolic (congestive) and diastolic (congestive) heart failure: Secondary | ICD-10-CM | POA: Diagnosis not present

## 2020-11-06 DIAGNOSIS — R040 Epistaxis: Secondary | ICD-10-CM | POA: Diagnosis not present

## 2020-11-06 DIAGNOSIS — I083 Combined rheumatic disorders of mitral, aortic and tricuspid valves: Secondary | ICD-10-CM | POA: Diagnosis present

## 2020-11-06 DIAGNOSIS — K219 Gastro-esophageal reflux disease without esophagitis: Secondary | ICD-10-CM | POA: Diagnosis present

## 2020-11-06 DIAGNOSIS — J9601 Acute respiratory failure with hypoxia: Secondary | ICD-10-CM | POA: Diagnosis not present

## 2020-11-06 DIAGNOSIS — I071 Rheumatic tricuspid insufficiency: Secondary | ICD-10-CM | POA: Diagnosis present

## 2020-11-06 DIAGNOSIS — I5021 Acute systolic (congestive) heart failure: Secondary | ICD-10-CM | POA: Diagnosis not present

## 2020-11-06 DIAGNOSIS — I9589 Other hypotension: Secondary | ICD-10-CM | POA: Diagnosis not present

## 2020-11-06 DIAGNOSIS — I4819 Other persistent atrial fibrillation: Secondary | ICD-10-CM | POA: Diagnosis present

## 2020-11-06 DIAGNOSIS — I272 Pulmonary hypertension, unspecified: Secondary | ICD-10-CM | POA: Diagnosis present

## 2020-11-06 DIAGNOSIS — I5033 Acute on chronic diastolic (congestive) heart failure: Secondary | ICD-10-CM | POA: Diagnosis not present

## 2020-11-06 DIAGNOSIS — I110 Hypertensive heart disease with heart failure: Secondary | ICD-10-CM | POA: Diagnosis not present

## 2020-11-06 LAB — BASIC METABOLIC PANEL
Anion gap: 14 (ref 5–15)
BUN: 40 mg/dL — ABNORMAL HIGH (ref 8–23)
CO2: 26 mmol/L (ref 22–32)
Calcium: 9.6 mg/dL (ref 8.9–10.3)
Chloride: 98 mmol/L (ref 98–111)
Creatinine, Ser: 1.92 mg/dL — ABNORMAL HIGH (ref 0.44–1.00)
GFR, Estimated: 25 mL/min — ABNORMAL LOW (ref >60)
Glucose, Bld: 105 mg/dL — ABNORMAL HIGH (ref 70–99)
Potassium: 3.4 mmol/L — ABNORMAL LOW (ref 3.5–5.1)
Sodium: 138 mmol/L (ref 135–145)

## 2020-11-06 LAB — CBC
HCT: 38.4 % (ref 36.0–46.0)
Hemoglobin: 11.7 g/dL — ABNORMAL LOW (ref 12.0–15.0)
MCH: 29.3 pg (ref 26.0–34.0)
MCHC: 30.5 g/dL (ref 30.0–36.0)
MCV: 96 fL (ref 80.0–100.0)
Platelets: 146 10*3/uL — ABNORMAL LOW (ref 150–400)
RBC: 4 MIL/uL (ref 3.87–5.11)
RDW: 16.2 % — ABNORMAL HIGH (ref 11.5–15.5)
WBC: 4.2 10*3/uL (ref 4.0–10.5)
nRBC: 0 % (ref 0.0–0.2)

## 2020-11-06 LAB — BRAIN NATRIURETIC PEPTIDE: B Natriuretic Peptide: 1049.9 pg/mL — ABNORMAL HIGH (ref 0.0–100.0)

## 2020-11-06 MED ORDER — ACETAMINOPHEN 325 MG PO TABS: 650.0000 mg | ORAL_TABLET | ORAL | Status: DC | PRN

## 2020-11-06 MED ORDER — RIVAROXABAN 15 MG PO TABS
15.0000 mg | ORAL_TABLET | Freq: Every day | ORAL | Status: DC
  Administered 2020-11-06 – 2020-11-20 (×15): 15 mg via ORAL
  Filled 2020-11-06 (×16): qty 1

## 2020-11-06 MED ORDER — FUROSEMIDE 10 MG/ML IJ SOLN
80.0000 mg | Freq: Every day | INTRAMUSCULAR | Status: DC
  Filled 2020-11-06: qty 8

## 2020-11-06 MED ORDER — RIVAROXABAN 15 MG PO TABS: 15.0000 mg | ORAL_TABLET | Freq: Every day | ORAL | Status: DC

## 2020-11-06 MED ORDER — ONDANSETRON HCL 4 MG/2ML IJ SOLN
4.0000 mg | Freq: Four times a day (QID) | INTRAMUSCULAR | Status: DC | PRN
  Administered 2020-11-13: 4 mg via INTRAVENOUS
  Filled 2020-11-06: qty 2

## 2020-11-06 MED ORDER — CARVEDILOL 6.25 MG PO TABS
6.2500 mg | ORAL_TABLET | Freq: Two times a day (BID) | ORAL | Status: DC
  Administered 2020-11-06 – 2020-11-11 (×11): 6.25 mg via ORAL
  Filled 2020-11-06 (×3): qty 1
  Filled 2020-11-06: qty 2
  Filled 2020-11-06 (×7): qty 1

## 2020-11-06 MED ORDER — SODIUM CHLORIDE 0.9 % IV SOLN: 250.0000 mL | INTRAVENOUS | Status: DC | PRN

## 2020-11-06 MED ORDER — FUROSEMIDE 10 MG/ML IJ SOLN
80.0000 mg | Freq: Once | INTRAMUSCULAR | Status: AC
  Administered 2020-11-06: 18:00:00 80 mg via INTRAVENOUS
  Filled 2020-11-06: qty 8

## 2020-11-06 MED ORDER — ALPRAZOLAM 0.5 MG PO TABS
0.5000 mg | ORAL_TABLET | Freq: Every day | ORAL | Status: DC
  Administered 2020-11-06 – 2020-11-20 (×15): 0.5 mg via ORAL
  Filled 2020-11-06: qty 1
  Filled 2020-11-06: qty 2
  Filled 2020-11-06 (×13): qty 1

## 2020-11-06 MED ORDER — POTASSIUM CHLORIDE CRYS ER 20 MEQ PO TBCR
40.0000 meq | EXTENDED_RELEASE_TABLET | Freq: Once | ORAL | Status: AC
  Administered 2020-11-06: 18:00:00 40 meq via ORAL
  Filled 2020-11-06: qty 2

## 2020-11-06 MED ORDER — BUSPIRONE HCL 5 MG PO TABS
5.0000 mg | ORAL_TABLET | Freq: Two times a day (BID) | ORAL | Status: DC
  Administered 2020-11-06 – 2020-11-21 (×30): 5 mg via ORAL
  Filled 2020-11-06 (×30): qty 1

## 2020-11-06 MED ORDER — AMIODARONE HCL 200 MG PO TABS
100.0000 mg | ORAL_TABLET | Freq: Every day | ORAL | Status: DC
  Administered 2020-11-07 – 2020-11-09 (×3): 100 mg via ORAL
  Filled 2020-11-06 (×3): qty 1

## 2020-11-06 MED ORDER — SODIUM CHLORIDE 0.9% FLUSH
3.0000 mL | INTRAVENOUS | Status: DC | PRN
Start: 2020-11-06 — End: 2020-11-21

## 2020-11-06 MED ORDER — POTASSIUM CHLORIDE CRYS ER 20 MEQ PO TBCR
20.0000 meq | EXTENDED_RELEASE_TABLET | Freq: Two times a day (BID) | ORAL | Status: DC
  Administered 2020-11-07 – 2020-11-17 (×20): 20 meq via ORAL
  Filled 2020-11-06 (×22): qty 1

## 2020-11-06 MED ORDER — SODIUM CHLORIDE 0.9% FLUSH
3.0000 mL | Freq: Two times a day (BID) | INTRAVENOUS | Status: DC
  Administered 2020-11-06 – 2020-11-21 (×29): 3 mL via INTRAVENOUS

## 2020-11-06 NOTE — Telephone Encounter (Signed)
Spoke with pt's daughter, Butch Penny, [place] who reports pt became very upset when discussing the need to go to ED.  Daughter states pt is planning on calling [employer] this am to see if she might qualify for [employer] program that would provide an MD to come to her home and access, draw labs and perform Xrays to manage her CHF at home.  Daughter will send [place] message re pt qualification for this program and how long it might take for enrollment and how quickly services might get started.    Daughter plans to go to pt's home now.  Daughter advised if pt continues to have edema, SOB and weakness she should go to ED for further evaluation and diuresis.  Pt's daughter verbalizes understanding and agrees with current plan.

## 2020-11-06 NOTE — Telephone Encounter (Signed)
See phone note for complete details

## 2020-11-06 NOTE — H&P (Signed)
History and Physical    [name] OXB:[id number] DOB: [birth year] DOA: 11/06/2020  PCP: Chesley Noon, MD  Patient coming from: Home  I have personally briefly reviewed patient's old medical records in [place]  Chief Complaint: CHF  HPI: [name] is a 86 y.o. female with medical history significant of A.Fib on amiodarone and Xarelto, HTN, SSS s/p PPM.  CHF, CKD.  Pt with h/o dCHF previously, echo in April 2021 showed newly reduced EF 25-30%, cards suspected this possibly due to her being paced 100% of the time.  Looks like adjustments were made, repeat echo in Sept 2021 showed normal EF.  CKD looks to be stage 3b over the past year with a baseline creat of about 1.6 or so though this is up since early 2020 when she was running 1.3.  Creat here in July was 1.7.  Creat at PCP in Sept was 1.37.  Last month her PCP noted that creat had increased to 1.68, was concerned that she was dehydrated and so stopped her lasix.  Since that time she has had worsening edema now progressed to anasarca, SOB, DOE, orthopnea.  Seen in cards office and had labs drawn last week on 12/23.  Cards put her on lasix 80mg  daily for 3 days then back to 60mg  daily.  Labs at cards office have since come back with creat of 2.4.  UOP in the interval hasnt been great.  Cards had pt come in to ED for evaluation.  No CP, no fevers/chills.   ED Course: Creat today is 1.92.  BNP is 1000, CXR shows small B pleural effusions.  Given 80mg  IV lasix in ED.   Review of Systems: As per HPI, otherwise all review of systems negative.  Past Medical History:  Diagnosis Date  . Anxiety   . Aortic valve sclerosis   . Atrial fibrillation ([place])   . Basal cell carcinoma    hx; near eyes  . Chronic systolic CHF (congestive heart failure) (HCC)    Echocardiogram 03/2020: EF 25-30, mid-dist lat, dist ant, mid-dist inf, mid-dist septal and apical severe HK/AK, Gr 2 DD, mild reduced RVSF, RVSP 41.8, severe  LAE, mod RAE, mild to mod MR, mild to mod TR, AoV sclerosis (no AS)  . GERD (gastroesophageal reflux disease)   . H/O one miscarriage   . Hard of hearing    has hearing aides but does not wear   . History of hiatal hernia   . History of kidney stones   . Hyperlipidemia   . Hypertension   . OA (osteoarthritis)    "right pointer; right hip" (01/05/2017)  . Obesity   . Renal insufficiency   . SSS (sick sinus syndrome) ([place]) 01/05/2017   a. s/p MDT dual chamber PPM   . Tinnitus   . Varicose veins   . Wears glasses     Past Surgical History:  Procedure Laterality Date  . BIV UPGRADE N/A 05/30/2020   Procedure: BIV PPM UPGRADE;  Surgeon: Deboraha Sprang, MD;  Location: [place] CV LAB;  Service: Cardiovascular;  Laterality: N/A;  . BREAST CYST EXCISION Right 1977   "benign"  . CARDIOVERSION N/A 07/25/2016   Procedure: CARDIOVERSION;  Surgeon: Lelon Perla, MD;  Location: [facility] ENDOSCOPY;  Service: Cardiovascular;  Laterality: N/A;  . CARDIOVERSION N/A 01/20/2019   Procedure: CARDIOVERSION;  Surgeon: Buford Dresser, MD;  Location: [facility] ENDOSCOPY;  Service: Cardiovascular;  Laterality: N/A;  . CARDIOVERSION N/A 12/05/2019   Procedure: CARDIOVERSION;  Surgeon: Dorothy Spark, MD;  Location: [facility] ENDOSCOPY;  Service: Cardiovascular;  Laterality: N/A;  . CARDIOVERSION N/A 05/07/2020   Procedure: CARDIOVERSION;  Surgeon: Thayer Headings, MD;  Location: [place];  Service: Cardiovascular;  Laterality: N/A;  . CATARACT EXTRACTION W/ INTRAOCULAR LENS  IMPLANT, BILATERAL Bilateral   . DILATION AND CURETTAGE OF UTERUS    . NASAL SEPTUM SURGERY    . OVARIAN CYST REMOVAL  2002  . PACEMAKER IMPLANT N/A 01/05/2017   Procedure: Pacemaker Implant;  Surgeon: Will Meredith Leeds, MD;  Location: [place] CV LAB;  Service: Cardiovascular;  Laterality: N/A;  . TEE WITHOUT CARDIOVERSION N/A 12/16/2016   Procedure: TRANSESOPHAGEAL ECHOCARDIOGRAM (TEE);  Surgeon: Larey Dresser, MD;  Location:  [place];  Service: Cardiovascular;  Laterality: N/A;  . TEE WITHOUT CARDIOVERSION N/A 01/20/2019   Procedure: TRANSESOPHAGEAL ECHOCARDIOGRAM (TEE);  Surgeon: Buford Dresser, MD;  Location[facility]COPY;  Service: Cardiovascular;  Laterality: N/A;  . TONSILLECTOMY    . TOTAL HIP ARTHROPLASTY Left 12/18/2015   Procedure: LEFT TOTAL HIP ARTHROPLASTY ANTERIOR APPROACH;  Surgeon: Paralee Cancel, MD;  Location: [facility];  Service: Orthopedics;  Laterality: Left;     reports that she has never smoked. She has never used smokeless tobacco. She reports that she does not drink alcohol and does not use drugs.  Allergies  Allergen Reactions  . Ace Inhibitors Cough  . Amlodipine Other (See Comments) and Cough    Higher doses = VERY BAD COUGHING  . Celebrex [Celecoxib] Other (See Comments)    Reflux  . Codeine Nausea Only  . Rofecoxib Itching, Swelling and Other (See Comments)    Vioxx- legs swelling, itching  . Ether For Anesthesia [Ether] Other (See Comments) and Cough    Burning and dry cough  . Norvasc [Amlodipine Besylate] Other (See Comments) and Cough    Higher doses = VERY BAD COUGHING     Family History  Problem Relation Age of Onset  . Diabetes Mother   . Heart disease Father   . Hypertension Sister   . Hypertension Brother      Prior to Admission medications   Medication Sig Start Date End Date Taking? Authorizing Provider  acetaminophen (TYLENOL) 500 MG tablet Take 500-1,000 mg by mouth every 8 (eight) hours as needed for mild pain.   Yes [provider]  ALPRAZolam Duanne Moron) 0.5 MG tablet Take 0.5 mg by mouth at bedtime.   Yes [provider]  amiodarone (PACERONE) 200 MG tablet Take 0.5 tablets (100 mg total) by mouth daily. 08/07/20  Yes Deboraha Sprang, MD  busPIRone (BUSPAR) 5 MG tablet Take 5 mg by mouth 2 (two) times daily.   Yes [provider]  carvedilol (COREG) 6.25 MG tablet Take 1 tablet (6.25 mg total) by mouth 2 (two) times daily.  07/13/20  Yes Deboraha Sprang, MD  Cyanocobalamin (B-12) 2500 MCG TABS Take 2,500 mcg by mouth every other day.    Yes [provider]  furosemide (LASIX) 40 MG tablet Take 1.5 tablets (60 mg total) by mouth daily. 11/01/20  Yes Shirley Friar, PA-C  Magnesium 400 MG CAPS Take 400 mg by mouth 3 (three) times a week.    Yes [provider]  Multiple Vitamin (MULTIVITAMIN WITH MINERALS) TABS tablet Take 1 tablet by mouth daily.   Yes [provider]  Potassium 99 MG TABS Take 198 mg by mouth daily.   Yes [provider]  Propylene Glycol (SYSTANE COMPLETE) 0.6 % SOLN Place  1 drop into both eyes 2 (two) times daily as needed (for dryness).   Yes [provider]  XARELTO 15 MG TABS tablet TAKE 1 TABLET(15 MG) BY MOUTH DAILY WITH SUPPER Patient taking differently: Take 15 mg by mouth daily with supper. 07/23/20  Yes Deboraha Sprang, MD    Physical Exam: Vitals:   11/06/20 1930 11/06/20 1945 11/06/20 2000 11/06/20 2026  BP: 110/76 104/61 107/68   Pulse: 96 (!) 59 60   Resp: 19 16 19    Temp:    (!) 97.3 F (36.3 C)  TempSrc:    Oral  SpO2: 93% 94% 92%   Weight:      Height:        Constitutional: NAD, calm, comfortable Eyes: PERRL, lids and conjunctivae normal ENMT: Mucous membranes are moist. Posterior pharynx clear of any exudate or lesions.Normal dentition.  Neck: normal, supple, no masses, no thyromegaly Respiratory: clear to auscultation bilaterally, no wheezing, no crackles. Normal respiratory effort. No accessory muscle use.  Cardiovascular: Regular rate and rhythm, no murmurs / rubs / gallops. 4+ BLE edema 2+ pedal pulses. No carotid bruits.  Abdomen: no tenderness, no masses palpated. No hepatosplenomegaly. Bowel sounds positive.  Musculoskeletal: no clubbing / cyanosis. No joint deformity upper and lower extremities. Good ROM, no contractures. Normal muscle tone.  Skin: no rashes, lesions, ulcers. No induration Neurologic: CN  2-12 grossly intact. Sensation intact, DTR normal. Strength 5/5 in all 4.  Psychiatric: Normal judgment and insight. Alert and oriented x 3. Normal mood.    Labs on Admission: I have personally reviewed following labs and imaging studies  CBC: Recent Labs  Lab 11/06/20 1326  WBC 4.2  HGB 11.7*  HCT 38.4  MCV 96.0  PLT 449*   Basic Metabolic Panel: Recent Labs  Lab 11/01/20 1046 11/06/20 1326  NA 140 138  K 4.3 3.4*  CL 98 98  CO2 20 26  GLUCOSE 109* 105*  BUN 50* 40*  CREATININE 2.41* 1.92*  CALCIUM 9.6 9.6   GFR: Estimated Creatinine Clearance: 19.2 mL/min (A) (by C-G formula based on SCr of 1.92 mg/dL (H)). Liver Function Tests: No results for input(s): AST, ALT, ALKPHOS, BILITOT, PROT, ALBUMIN in the last 168 hours. No results for input(s): LIPASE, AMYLASE in the last 168 hours. No results for input(s): AMMONIA in the last 168 hours. Coagulation Profile: No results for input(s): INR, PROTIME in the last 168 hours. Cardiac Enzymes: No results for input(s): CKTOTAL, CKMB, CKMBINDEX, TROPONINI in the last 168 hours. BNP (last 3 results) Recent Labs    03/02/20 0930  PROBNP 3,278*   HbA1C: No results for input(s): HGBA1C in the last 72 hours. CBG: No results for input(s): GLUCAP in the last 168 hours. Lipid Profile: No results for input(s): CHOL, HDL, LDLCALC, TRIG, CHOLHDL, LDLDIRECT in the last 72 hours. Thyroid Function Tests: No results for input(s): TSH, T4TOTAL, FREET4, T3FREE, THYROIDAB in the last 72 hours. Anemia Panel: No results for input(s): VITAMINB12, FOLATE, FERRITIN, TIBC, IRON, RETICCTPCT in the last 72 hours. Urine analysis:    Component Value Date/Time   COLORURINE COLORLESS (A) 11/10/2017 [place] 11/10/2017 1227   LABSPEC 1.003 (L) 11/10/2017 1227   PHURINE 7.0 11/10/2017 [place] 11/10/2017 1227   HGBUR NEGATIVE 11/10/2017 [place] 11/10/2017 [place] 11/10/2017  [place] 11/10/2017 1227   NITRITE NEGATIVE 11/10/2017 [place] 11/10/2017 1227    Radiological  Exams on Admission: DG Chest 2 View  Result Date: 11/06/2020 CLINICAL DATA:  Increasing shortness of breath over the past few weeks. EXAM: CHEST - 2 VIEW COMPARISON:  PA and lateral chest 05/30/2020. FINDINGS: There is cardiomegaly without edema. Small bilateral pleural effusions basilar atelectasis are seen. Pacing device in place. No pneumothorax. IMPRESSION: Small bilateral pleural effusions and basilar atelectasis. Cardiomegaly without edema. Electronically Signed   By: Inge Rise M.D.   On: 11/06/2020 13:52    EKG: Independently reviewed.  Assessment/Plan Principal Problem:   CKD (chronic kidney disease) stage 3, GFR 30-59 ml/min (HCC) Active Problems:   Hypertension   Sick sinus syndrome (HCC)   Paroxysmal atrial fibrillation (HCC)   Biventricular cardiac pacemaker in situ   Acute on chronic combined systolic and diastolic CHF (congestive heart failure) ([place])   AKI (acute kidney injury) ([place])    1. Acute on chronic CHF - 1. Not sure what the EF is doing this time around, will need to get 2d echo to find out. 2. CHF pathway 3. Lasix 80mg  IV in ED and 80mg  IV daily for the moment 4. Call cards for consult especially if EF is reduced again (looks like they were able to adjust PPM earlier this year and get EF back to normal).  Or especially if lasix ineffective or creat worsens 5. Strict intake and output 6. 1220ml fluid restriction 7. Tele monitor 2. AKI on CKD 3b - 1. Looks like this may have improved some over the past 1 week with increased lasix dose 2. Lasix as above 3. Daily BMP and close monitoring of renal function with diuresis. 3. SSS and biventricular PPM - 1. As above 4. HTN - 1. Only on coreg at this point 2. BP actually on low side 5. PAF - 1. Cont amiodarone 2. Cont xarelto  DVT prophylaxis: Xarelto Code Status:  Full Family Communication: Daughter at bedside Disposition Plan: Home after diuresis and CHF work up as above Consults called: None Admission status: Place in obs    Lillyanne Bradburn, [place] [facility]  How to contact the [facility] Attending or Consulting provider [place] or covering provider during after hours [place], for this patient?  1. Check the care team in [facility] and look for a) attending/consulting [facility] provider listed and b) the [facility] team listed 2. Log into [email or web address]  Amion Physician Scheduling and messaging for groups and whole hospitals  On call and physician scheduling software for group practices, residents, hospitalists and other medical providers for call, clinic, rotation and shift schedules. OnCall Enterprise is a hospital-wide system for scheduling doctors and paging doctors on call. EasyPlot is for scientific plotting and data analysis.  [email or web address]  and use [place]'s universal password to access. If you do not have the password, please contact the hospital operator.  3. Locate the [facility] provider you are looking for under [facility] and page to a number that you can be directly reached. 4. If you still have difficulty reaching the provider, please page the [facility] (Director on Call) for the Hospitalists listed on amion for assistance.  11/06/2020, 8:37 PM

## 2020-11-06 NOTE — ED Notes (Signed)
Followed up on covid swab results, called lab and sample not found. Will re-swab pt again.

## 2020-11-06 NOTE — ED Triage Notes (Signed)
Pt arrives via [facility] from home, hx of chf, progressing sob over the past few weeks, unable to do normal activities without feeling sob. Also recently diagnosed with renal insufficiency. Spoke with cardiologist who referred her here for fluid retention, sats 96% on room air, speaking in full sentences.

## 2020-11-06 NOTE — ED Provider Notes (Signed)
[facility] EMERGENCY DEPARTMENT Provider Note   CSN: [id number] Arrival date & time: 11/06/20  1320     History Chief Complaint  Patient presents with  . Shortness of Breath    [name] is a 86 y.o. female.  86 year old female with history of persistent a fib (on Xarelto), systolic CFH (EF 64-40% 01/4741), renal insufficiency, additional history as listed below, brought in by EMS for [facility], worse since last night. Patient states she has not felt well for the past few weeks, had her lasix increased to 80mg  but was contacted after labs and told to decrease back to 60mg . No improvement in her leg swelling. States [facility] is worse with exertion, trying to lay down to sleep last night, only able to speak in short sentences. Denies fevers, chills, chest pain.         Past Medical History:  Diagnosis Date  . Anxiety   . Aortic valve sclerosis   . Atrial fibrillation ([place])   . Basal cell carcinoma    hx; near eyes  . Chronic systolic CHF (congestive heart failure) (HCC)    Echocardiogram 03/2020: EF 25-30, mid-dist lat, dist ant, mid-dist inf, mid-dist septal and apical severe HK/AK, Gr 2 DD, mild reduced RVSF, RVSP 41.8, severe LAE, mod RAE, mild to mod MR, mild to mod TR, AoV sclerosis (no AS)  . GERD (gastroesophageal reflux disease)   . H/O one miscarriage   . Hard of hearing    has hearing aides but does not wear   . History of hiatal hernia   . History of kidney stones   . Hyperlipidemia   . Hypertension   . OA (osteoarthritis)    "right pointer; right hip" (01/05/2017)  . Obesity   . Renal insufficiency   . SSS (sick sinus syndrome) ([place]) 01/05/2017   a. s/p MDT dual chamber PPM   . Tinnitus   . Varicose veins   . Wears glasses     Patient Active Problem List   Diagnosis Date Noted  . Orthostatic lightheadedness 08/06/2020  . Persistent atrial fibrillation ([place])   . Acute systolic heart failure ([place]) 03/26/2020  . Biventricular cardiac  pacemaker in situ 01/23/2020  . Acute respiratory failure with hypoxia ([place]) 01/20/2019  . Paroxysmal atrial fibrillation (HCC)   . Sick sinus syndrome ([place]) 01/05/2017  . Overweight (BMI 25.0-29.9) 12/19/2015  . S/P left THA, AA 12/18/2015  . Chronic venous insufficiency 11/16/2015  . Varicose veins of both lower extremities 11/16/2015  . Edema 05/20/2012  . Bradycardia   . Hypertension   . Hyperlipidemia     Past Surgical History:  Procedure Laterality Date  . BIV UPGRADE N/A 05/30/2020   Procedure: BIV PPM UPGRADE;  Surgeon: Deboraha Sprang, MD;  Location: [place] CV LAB;  Service: Cardiovascular;  Laterality: N/A;  . BREAST CYST EXCISION Right 1977   "benign"  . CARDIOVERSION N/A 07/25/2016   Procedure: CARDIOVERSION;  Surgeon: Lelon Perla, MD;  Location: [facility] ENDOSCOPY;  Service: Cardiovascular;  Laterality: N/A;  . CARDIOVERSION N/A 01/20/2019   Procedure: CARDIOVERSION;  Surgeon: Buford Dresser, MD;  Location: [facility] ENDOSCOPY;  Service: Cardiovascular;  Laterality: N/A;  . CARDIOVERSION N/A 12/05/2019   Procedure: CARDIOVERSION;  Surgeon: Dorothy Spark, MD;  Location: [facility] ENDOSCOPY;  Service: Cardiovascular;  Laterality: N/A;  . CARDIOVERSION N/A 05/07/2020   Procedure: CARDIOVERSION;  Surgeon: Acie Fredrickson Wonda Cheng, MD;  Location: [place];  Service: Cardiovascular;  Laterality: N/A;  . CATARACT EXTRACTION  W/ INTRAOCULAR LENS  IMPLANT, BILATERAL Bilateral   . DILATION AND CURETTAGE OF UTERUS    . NASAL SEPTUM SURGERY    . OVARIAN CYST REMOVAL  2002  . PACEMAKER IMPLANT N/A 01/05/2017   Procedure: Pacemaker Implant;  Surgeon: Will Meredith Leeds, MD;  Location: [place] CV LAB;  Service: Cardiovascular;  Laterality: N/A;  . TEE WITHOUT CARDIOVERSION N/A 12/16/2016   Procedure: TRANSESOPHAGEAL ECHOCARDIOGRAM (TEE);  Surgeon: Larey Dresser, MD;  Location: [place];  Service: Cardiovascular;  Laterality: N/A;  . TEE WITHOUT CARDIOVERSION N/A 01/20/2019    Procedure: TRANSESOPHAGEAL ECHOCARDIOGRAM (TEE);  Surgeon: Buford Dresser, MD;  Location: [facility] ENDOSCOPY;  Service: Cardiovascular;  Laterality: N/A;  . TONSILLECTOMY    . TOTAL HIP ARTHROPLASTY Left 12/18/2015   Procedure: LEFT TOTAL HIP ARTHROPLASTY ANTERIOR APPROACH;  Surgeon: Paralee Cancel, MD;  [facility];  Service: Orthopedics;  Laterality: Left;     OB History   No obstetric history on file.     Family History  Problem Relation Age of Onset  . Diabetes Mother   . Heart disease Father   . Hypertension Sister   . Hypertension Brother     Social History   Tobacco Use  . Smoking status: Never Smoker  . Smokeless tobacco: Never Used  Substance Use Topics  . Alcohol use: No  . Drug use: No    Home Medications Prior to Admission medications   Medication Sig Start Date End Date Taking? Authorizing Provider  acetaminophen (TYLENOL) 500 MG tablet Take 1,000 mg by mouth at bedtime.    [provider]  ALPRAZolam Duanne Moron) 0.5 MG tablet Take 0.5 mg by mouth at bedtime.    [provider]  amiodarone (PACERONE) 200 MG tablet Take 0.5 tablets (100 mg total) by mouth daily. 08/07/20   Deboraha Sprang, MD  busPIRone (BUSPAR) 5 MG tablet Take 5 mg by mouth 2 (two) times daily as needed (for stress/anxiety).     [provider]  carvedilol (COREG) 6.25 MG tablet Take 1 tablet (6.25 mg total) by mouth 2 (two) times daily. 07/13/20   Deboraha Sprang, MD  Cyanocobalamin (B-12) 2500 MCG TABS Take 2,500 mcg by mouth every other day.     [provider]  furosemide (LASIX) 40 MG tablet Take 1.5 tablets (60 mg total) by mouth daily. 11/01/20   Shirley Friar, PA-C  Magnesium 400 MG CAPS Take 400 mg by mouth 3 (three) times a week.     [provider]  Multiple Vitamin (MULTIVITAMIN WITH MINERALS) TABS tablet Take 1 tablet by mouth daily.    [provider]  Potassium 99 MG TABS Take 2 tablets by mouth daily.     [provider]  XARELTO 15 MG TABS tablet TAKE 1 TABLET(15 MG) BY MOUTH DAILY WITH SUPPER Patient taking differently: Take 15 mg by mouth daily with supper. 07/23/20   Deboraha Sprang, MD    Allergies    Ace inhibitors, Amlodipine, Celebrex [celecoxib], Codeine, Rofecoxib, Ether for anesthesia [ether], and Norvasc [amlodipine besylate]  Review of Systems   Review of Systems  Physical Exam Updated Vital Signs BP 106/74   Pulse 96   Temp 97.9 F (36.6 C) (Oral)   Resp 19   Ht 5\' 4"  (1.626 m)   Wt 68 kg   SpO2 94%   BMI 25.75 kg/m   Physical Exam  ED Results / Procedures / Treatments   Labs (all labs ordered are listed, but only  abnormal results are displayed) Labs Reviewed  BASIC METABOLIC PANEL - Abnormal; Notable for the following components:      Result Value   Potassium 3.4 (*)    Glucose, Bld 105 (*)    BUN 40 (*)    Creatinine, Ser 1.92 (*)    GFR, Estimated 25 (*)    All other components within normal limits  CBC - Abnormal; Notable for the following components:   Hemoglobin 11.7 (*)    RDW 16.2 (*)    Platelets 146 (*)    All other components within normal limits  BRAIN NATRIURETIC PEPTIDE - Abnormal; Notable for the following components:   B Natriuretic Peptide 1,049.9 (*)    All other components within normal limits  RESP PANEL BY RT-PCR (FLU A&B, COVID) [id number]    EKG EKG Interpretation  Date/Time:  Tuesday November 06 2020 13:20:18 EST Ventricular Rate:  69 PR Interval:    QRS Duration: 180 QT Interval:  532 QTC Calculation: 570 R Axis:   164 Text Interpretation: AV dual-paced rhythm Abnormal ECG Confirmed by Malvin Johns [phone]) on 11/06/2020 6:02:23 PM   Radiology DG Chest 2 View  Result Date: 11/06/2020 CLINICAL DATA:  Increasing shortness of breath over the past few weeks. EXAM: CHEST - 2 VIEW COMPARISON:  PA and lateral chest 05/30/2020. FINDINGS: There is cardiomegaly without edema. Small bilateral pleural effusions basilar atelectasis  are seen. Pacing device in place. No pneumothorax. IMPRESSION: Small bilateral pleural effusions and basilar atelectasis. Cardiomegaly without edema. Electronically Signed   By: Inge Rise M.D.   On: 11/06/2020 13:52    Procedures Procedures (including critical care time)  Medications Ordered in ED Medications  furosemide (LASIX) injection 80 mg (80 mg Intravenous Given 11/06/20 1811)  potassium chloride SA (KLOR-CON) CR tablet 40 mEq (40 mEq Oral Given 11/06/20 1813)    ED Course  I have reviewed the triage vital signs and the nursing notes.  Pertinent labs & imaging results that were available during my care of the patient were reviewed by me and considered in my medical decision making (see chart for details).  Clinical Course as of 11/06/20 1942  Tue Nov 07, 5743  391 86 year old female with history of CHF presents with complaint of not feeling well for the past few weeks now with worsening shortness of breath since last night.  On exam, patient speaks in very short sentences obviously short of breath, diminished lung sounds in the bases with pitting edema to the lower extremities.  Discussed with Dr. Tamera Punt, ER attending, plan for lab check and IV Lasix. Review of labs, baseline chronic kidney disease with creatinine of 1.92 today, potassium is 3.4.  Patient was given oral potassium in addition to IV Lasix. CBC with normal white blood cell count.  BNP elevated at 1049.9. Chest x-ray shows small bilateral pleural effusions and basilar atelectasis.  EKG AV dual paced rhythm. On recheck, O2 sats at 90% on RA. BP with systolic in the 25D.  Plan is for admission for acute on chronic CHF.  [LM]  1901 Case discussed with Dr. Alcario Drought with [facility] who will consult for admission. [LM]    Clinical Course User Index [LM] Roque Lias   MDM Rules/Calculators/A&P                          Final Clinical Impression(s) / ED Diagnoses Final diagnoses:  Acute on  chronic congestive heart failure, unspecified heart failure type (  [facility])    Rx / DC Orders ED Discharge Orders    None       Roque Lias 11/06/20 1942    Malvin Johns, MD 11/06/20 2023

## 2020-11-07 ENCOUNTER — Other Ambulatory Visit: Payer: Self-pay

## 2020-11-07 ENCOUNTER — Observation Stay (HOSPITAL_BASED_OUTPATIENT_CLINIC_OR_DEPARTMENT_OTHER): Payer: Medicare Other

## 2020-11-07 ENCOUNTER — Encounter (HOSPITAL_COMMUNITY): Payer: Self-pay | Admitting: Internal Medicine

## 2020-11-07 DIAGNOSIS — I5040 Unspecified combined systolic (congestive) and diastolic (congestive) heart failure: Secondary | ICD-10-CM

## 2020-11-07 DIAGNOSIS — Z950 Presence of cardiac pacemaker: Secondary | ICD-10-CM | POA: Diagnosis not present

## 2020-11-07 DIAGNOSIS — I509 Heart failure, unspecified: Secondary | ICD-10-CM | POA: Diagnosis not present

## 2020-11-07 DIAGNOSIS — N1832 Chronic kidney disease, stage 3b: Secondary | ICD-10-CM | POA: Diagnosis not present

## 2020-11-07 DIAGNOSIS — I480 Paroxysmal atrial fibrillation: Secondary | ICD-10-CM | POA: Diagnosis not present

## 2020-11-07 LAB — BASIC METABOLIC PANEL
Anion gap: 12 (ref 5–15)
BUN: 42 mg/dL — ABNORMAL HIGH (ref 8–23)
CO2: 24 mmol/L (ref 22–32)
Calcium: 9.3 mg/dL (ref 8.9–10.3)
Chloride: 104 mmol/L (ref 98–111)
Creatinine, Ser: 1.91 mg/dL — ABNORMAL HIGH (ref 0.44–1.00)
GFR, Estimated: 25 mL/min — ABNORMAL LOW (ref >60)
Glucose, Bld: 124 mg/dL — ABNORMAL HIGH (ref 70–99)
Potassium: 3.9 mmol/L (ref 3.5–5.1)
Sodium: 140 mmol/L (ref 135–145)

## 2020-11-07 LAB — ECHOCARDIOGRAM COMPLETE
AR max vel: 3.05 cm2
AV Area VTI: 2.84 cm2
AV Area mean vel: 2.88 cm2
AV Mean grad: 3 mmHg
AV Peak grad: 5.6 mmHg
Ao pk vel: 1.19 m/s
Area-P 1/2: 3.77 cm2
Calc EF: 54.5 %
Height: 62 in
MV M vel: 4.07 m/s
MV Peak grad: 66.3 mmHg
S' Lateral: 3.05 cm
Single Plane A2C EF: 56.6 %
Single Plane A4C EF: 54.2 %
Weight: 2419.2 oz

## 2020-11-07 LAB — SARS CORONAVIRUS 2 (TAT 6-24 HRS): SARS Coronavirus 2: NEGATIVE

## 2020-11-07 MED ORDER — FUROSEMIDE 10 MG/ML IJ SOLN
40.0000 mg | Freq: Two times a day (BID) | INTRAMUSCULAR | Status: DC
  Administered 2020-11-07: 17:00:00 40 mg via INTRAVENOUS
  Filled 2020-11-07: qty 4

## 2020-11-07 NOTE — Progress Notes (Signed)
Progress Note    SARAPHINA Roberson  WEX:[id number] DOB: 06/11/1930  DOA: 11/06/2020 PCP: Chesley Noon, MD    Brief Narrative:    Medical records reviewed and are as summarized below:  [name] is an 86 y.o. female with medical history significant of A.Fib on amiodarone and Xarelto, HTN, SSS s/p PPM.  CHF, CKD. Pt with h/o dCHF previously, echo in April 2021 showed newly reduced EF 25-30%, cards suspected this possibly due to her being paced 100% of the time.  Looks like adjustments were made, repeat echo in Sept 2021 showed normal EF. Last month her PCP noted that creat had increased to 1.68, was concerned that she was dehydrated and so stopped her lasix. Since that time she has had worsening edema now progressed to anasarca, SOB, DOE, orthopnea. Seen in cards office and had labs drawn last week on 12/23.  Cards put her on lasix 80mg  daily for 3 days then back to 60mg  daily.   Assessment/Plan:   Principal Problem:   CKD (chronic kidney disease) stage 3, GFR 30-59 ml/min (HCC) Active Problems:   Hypertension   Sick sinus syndrome (HCC)   Paroxysmal atrial fibrillation (HCC)   Biventricular cardiac pacemaker in situ   Acute on chronic combined systolic and diastolic CHF (congestive heart failure) (HCC)   AKI (acute kidney injury) ([place])    Acute on chronic CHF - -echo ordered upon admission -IV lasix, hold for hypotension -daily weights/Strict intake and output -1214ml fluid restriction  AKI on CKD 3b - -IV lasix  SSS and biventricular PPM -  -follows with Dr. Caryl Comes  HTN - -Only on coreg at this point -BP actually on low side  PAF - -Cont amiodarone -Cont xarelto    Family Communication/Anticipated D/C date and plan/Code Status   DVT prophylaxis: xarelto Code Status: Full Code.  Disposition Plan: Status is: Observation  The patient will require care spanning > 2 midnights and should be moved to inpatient because: Inpatient level of care appropriate  due to severity of illness  Dispo: The patient is from: Home              Anticipated d/c is to: Home              Anticipated d/c date is: 2 days              Patient currently is not medically stable to d/c.         Medical Consultants:    None.     Subjective:   Feels like breathing is better, says she usually does not have LE Edema  Objective:    Vitals:   11/07/20 0900 11/07/20 0915 11/07/20 0950 11/07/20 1022  BP: (!) 104/55 107/62  94/62  Pulse: (!) 58 (!) 58    Resp: 18 15  19   Temp:   97.8 F (36.6 C) (!) 97.4 F (36.3 C)  TempSrc:   Oral Oral  SpO2: 92% 91%    Weight:    68.6 kg  Height:    5\' 2"  (1.575 m)    Intake/Output Summary (Last 24 hours) at 11/07/2020 1342 Last data filed at 11/07/2020 0406 Gross per 24 hour  Intake --  Output 250 ml  Net -250 ml   Filed Weights   11/06/20 1325 11/07/20 1022  Weight: 68 kg 68.6 kg    Exam:  General: Appearance:     Overweight female in no acute distress  Eyes:    PERRL,  conjunctiva/corneas clear, EOM's intact       Lungs:     Diminished at bases, respirations unlabored  Heart:    Bradycardic. irr , +LE edema  MS:   All extremities are intact.   Neurologic:   Awake, alert- pleasant and cooperative    Data Reviewed:   I have personally reviewed following labs and imaging studies:  Labs: Labs show the following:   Basic Metabolic Panel: Recent Labs  Lab 11/01/20 1046 11/06/20 1326 11/07/20 0530  NA 140 138 140  K 4.3 3.4* 3.9  CL 98 98 104  CO2 20 26 24   GLUCOSE 109* 105* 124*  BUN 50* 40* 42*  CREATININE 2.41* 1.92* 1.91*  CALCIUM 9.6 9.6 9.3   GFR Estimated Creatinine Clearance: 18.5 mL/min (A) (by C-G formula based on SCr of 1.91 mg/dL (H)). Liver Function Tests: No results for input(s): AST, ALT, ALKPHOS, BILITOT, PROT, ALBUMIN in the last 168 hours. No results for input(s): LIPASE, AMYLASE in the last 168 hours. No results for input(s): AMMONIA in the last 168  hours. Coagulation profile No results for input(s): INR, PROTIME in the last 168 hours.  CBC: Recent Labs  Lab 11/06/20 1326  WBC 4.2  HGB 11.7*  HCT 38.4  MCV 96.0  PLT 146*   Cardiac Enzymes: No results for input(s): CKTOTAL, CKMB, CKMBINDEX, TROPONINI in the last 168 hours. BNP (last 3 results) Recent Labs    03/02/20 0930  PROBNP 3,278*   CBG: No results for input(s): GLUCAP in the last 168 hours. D-Dimer: No results for input(s): DDIMER in the last 72 hours. Hgb A1c: No results for input(s): HGBA1C in the last 72 hours. Lipid Profile: No results for input(s): CHOL, HDL, LDLCALC, TRIG, CHOLHDL, LDLDIRECT in the last 72 hours. Thyroid function studies: No results for input(s): TSH, T4TOTAL, T3FREE, THYROIDAB in the last 72 hours.  Invalid input(s): FREET3 Anemia work up: No results for input(s): VITAMINB12, FOLATE, FERRITIN, TIBC, IRON, RETICCTPCT in the last 72 hours. Sepsis Labs: Recent Labs  Lab 11/06/20 1326  WBC 4.2    Microbiology Recent Results (from the past 240 hour(s))  SARS CORONAVIRUS 2 (TAT 6-24 HRS) Nasopharyngeal Nasopharyngeal Swab     Status: None   Collection Time: 11/06/20  8:27 PM   Specimen: Nasopharyngeal Swab  Result Value Ref Range Status   SARS Coronavirus 2 NEGATIVE NEGATIVE Final    Comment: (NOTE) SARS-CoV-2 target nucleic acids are NOT DETECTED.  The SARS-CoV-2 RNA is generally detectable in upper and lower respiratory specimens during the acute phase of infection. Negative results do not preclude SARS-CoV-2 infection, do not rule out co-infections with other pathogens, and should not be used as the sole basis for treatment or other patient management decisions. Negative results must be combined with clinical observations, patient history, and epidemiological information. The expected result is Negative.  Fact Sheet for Patients: SugarRoll.be  Fact Sheet for Healthcare  Providers: https://www.woods-mathews.com/  This test is not yet approved or cleared by the [place] FDA and  has been authorized for detection and/or diagnosis of SARS-CoV-2 by FDA under an Emergency Use Authorization (EUA). This EUA will remain  in effect (meaning this test can be used) for the duration of the COVID-19 declaration under Se ction 564(b)(1) of the Act, 21 U.S.C. section 360bbb-3(b)(1), unless the authorization is terminated or revoked sooner.  Performed at [facility], [place] [address], [place], [place] [address]     Procedures and diagnostic studies:  DG Chest 2 View  Result Date: 11/06/2020 CLINICAL DATA:  Increasing shortness of breath over the past few weeks. EXAM: CHEST - 2 VIEW COMPARISON:  PA and lateral chest 05/30/2020. FINDINGS: There is cardiomegaly without edema. Small bilateral pleural effusions basilar atelectasis are seen. Pacing device in place. No pneumothorax. IMPRESSION: Small bilateral pleural effusions and basilar atelectasis. Cardiomegaly without edema. Electronically Signed   By: Inge Rise M.D.   On: 11/06/2020 13:52    Medications:   . ALPRAZolam  0.5 mg Oral QHS  . amiodarone  100 mg Oral Daily  . busPIRone  5 mg Oral BID  . carvedilol  6.25 mg Oral BID  . furosemide  40 mg Intravenous BID  . potassium chloride  20 mEq Oral BID  . Rivaroxaban  15 mg Oral Q supper  . sodium chloride flush  3 mL Intravenous Q12H   Continuous Infusions: . sodium chloride       LOS: 0 days   Geradine Girt  [facility]   How to contact the [facility] Attending or Consulting provider [place] or covering provider during after hours [place], for this patient?  1. Check the care team in [facility] and look for a) attending/consulting [facility] provider listed and b) the [facility] team listed 2. Log into [email or web address] and use [place]'s universal password to access. If you do not have the password, please contact the hospital  operator. 3. Locate the [facility] provider you are looking for under [facility] and page to a number that you can be directly reached. 4. If you still have difficulty reaching the provider, please page the [facility] (Director on Call) for the Hospitalists listed on amion for assistance.  11/07/2020, 1:42 PM

## 2020-11-07 NOTE — Progress Notes (Signed)
  Echocardiogram 2D Echocardiogram has been performed.  [name] 11/07/2020, 3:39 PM

## 2020-11-08 ENCOUNTER — Inpatient Hospital Stay (HOSPITAL_COMMUNITY): Payer: Medicare Other

## 2020-11-08 ENCOUNTER — Encounter (HOSPITAL_COMMUNITY): Payer: Self-pay | Admitting: Internal Medicine

## 2020-11-08 DIAGNOSIS — Z96642 Presence of left artificial hip joint: Secondary | ICD-10-CM | POA: Diagnosis present

## 2020-11-08 DIAGNOSIS — Z85828 Personal history of other malignant neoplasm of skin: Secondary | ICD-10-CM | POA: Diagnosis not present

## 2020-11-08 DIAGNOSIS — R040 Epistaxis: Secondary | ICD-10-CM | POA: Diagnosis not present

## 2020-11-08 DIAGNOSIS — Z79899 Other long term (current) drug therapy: Secondary | ICD-10-CM | POA: Diagnosis not present

## 2020-11-08 DIAGNOSIS — I495 Sick sinus syndrome: Secondary | ICD-10-CM | POA: Diagnosis present

## 2020-11-08 DIAGNOSIS — E785 Hyperlipidemia, unspecified: Secondary | ICD-10-CM | POA: Diagnosis present

## 2020-11-08 DIAGNOSIS — N1832 Chronic kidney disease, stage 3b: Secondary | ICD-10-CM | POA: Diagnosis not present

## 2020-11-08 DIAGNOSIS — J9601 Acute respiratory failure with hypoxia: Secondary | ICD-10-CM | POA: Diagnosis present

## 2020-11-08 DIAGNOSIS — K219 Gastro-esophageal reflux disease without esophagitis: Secondary | ICD-10-CM | POA: Diagnosis present

## 2020-11-08 DIAGNOSIS — I5021 Acute systolic (congestive) heart failure: Secondary | ICD-10-CM | POA: Diagnosis not present

## 2020-11-08 DIAGNOSIS — R0600 Dyspnea, unspecified: Secondary | ICD-10-CM | POA: Diagnosis not present

## 2020-11-08 DIAGNOSIS — I272 Pulmonary hypertension, unspecified: Secondary | ICD-10-CM | POA: Diagnosis present

## 2020-11-08 DIAGNOSIS — I509 Heart failure, unspecified: Secondary | ICD-10-CM | POA: Diagnosis not present

## 2020-11-08 DIAGNOSIS — I4819 Other persistent atrial fibrillation: Secondary | ICD-10-CM | POA: Diagnosis present

## 2020-11-08 DIAGNOSIS — J918 Pleural effusion in other conditions classified elsewhere: Secondary | ICD-10-CM | POA: Diagnosis present

## 2020-11-08 DIAGNOSIS — Z950 Presence of cardiac pacemaker: Secondary | ICD-10-CM | POA: Diagnosis not present

## 2020-11-08 DIAGNOSIS — I130 Hypertensive heart and chronic kidney disease with heart failure and stage 1 through stage 4 chronic kidney disease, or unspecified chronic kidney disease: Secondary | ICD-10-CM | POA: Diagnosis present

## 2020-11-08 DIAGNOSIS — N179 Acute kidney failure, unspecified: Secondary | ICD-10-CM | POA: Diagnosis not present

## 2020-11-08 DIAGNOSIS — I5043 Acute on chronic combined systolic (congestive) and diastolic (congestive) heart failure: Secondary | ICD-10-CM

## 2020-11-08 DIAGNOSIS — I428 Other cardiomyopathies: Secondary | ICD-10-CM | POA: Diagnosis present

## 2020-11-08 DIAGNOSIS — E876 Hypokalemia: Secondary | ICD-10-CM | POA: Diagnosis not present

## 2020-11-08 DIAGNOSIS — I9589 Other hypotension: Secondary | ICD-10-CM | POA: Diagnosis present

## 2020-11-08 DIAGNOSIS — Z7901 Long term (current) use of anticoagulants: Secondary | ICD-10-CM | POA: Diagnosis not present

## 2020-11-08 DIAGNOSIS — Z20822 Contact with and (suspected) exposure to covid-19: Secondary | ICD-10-CM | POA: Diagnosis present

## 2020-11-08 DIAGNOSIS — N390 Urinary tract infection, site not specified: Secondary | ICD-10-CM | POA: Diagnosis present

## 2020-11-08 DIAGNOSIS — I5033 Acute on chronic diastolic (congestive) heart failure: Secondary | ICD-10-CM | POA: Diagnosis not present

## 2020-11-08 DIAGNOSIS — E0580 Other thyrotoxicosis without thyrotoxic crisis or storm: Secondary | ICD-10-CM | POA: Diagnosis present

## 2020-11-08 DIAGNOSIS — D6489 Other specified anemias: Secondary | ICD-10-CM | POA: Diagnosis present

## 2020-11-08 DIAGNOSIS — I480 Paroxysmal atrial fibrillation: Secondary | ICD-10-CM | POA: Diagnosis not present

## 2020-11-08 LAB — COMPREHENSIVE METABOLIC PANEL
ALT: 30 U/L (ref 0–44)
AST: 31 U/L (ref 15–41)
Albumin: 2.8 g/dL — ABNORMAL LOW (ref 3.5–5.0)
Alkaline Phosphatase: 79 U/L (ref 38–126)
Anion gap: 9 (ref 5–15)
BUN: 46 mg/dL — ABNORMAL HIGH (ref 8–23)
CO2: 29 mmol/L (ref 22–32)
Calcium: 9.2 mg/dL (ref 8.9–10.3)
Chloride: 102 mmol/L (ref 98–111)
Creatinine, Ser: 2.33 mg/dL — ABNORMAL HIGH (ref 0.44–1.00)
GFR, Estimated: 20 mL/min — ABNORMAL LOW (ref >60)
Glucose, Bld: 109 mg/dL — ABNORMAL HIGH (ref 70–99)
Potassium: 4.1 mmol/L (ref 3.5–5.1)
Sodium: 140 mmol/L (ref 135–145)
Total Bilirubin: 1.8 mg/dL — ABNORMAL HIGH (ref 0.3–1.2)
Total Protein: 5.8 g/dL — ABNORMAL LOW (ref 6.5–8.1)

## 2020-11-08 LAB — CBC
HCT: 35.4 % — ABNORMAL LOW (ref 36.0–46.0)
Hemoglobin: 10.1 g/dL — ABNORMAL LOW (ref 12.0–15.0)
MCH: 28 pg (ref 26.0–34.0)
MCHC: 28.5 g/dL — ABNORMAL LOW (ref 30.0–36.0)
MCV: 98.1 fL (ref 80.0–100.0)
Platelets: 123 10*3/uL — ABNORMAL LOW (ref 150–400)
RBC: 3.61 MIL/uL — ABNORMAL LOW (ref 3.87–5.11)
RDW: 16.3 % — ABNORMAL HIGH (ref 11.5–15.5)
WBC: 4.6 10*3/uL (ref 4.0–10.5)
nRBC: 0 % (ref 0.0–0.2)

## 2020-11-08 LAB — URINALYSIS, MICROSCOPIC (REFLEX): RBC / HPF: NONE SEEN RBC/hpf (ref 0–5)

## 2020-11-08 LAB — URINALYSIS, ROUTINE W REFLEX MICROSCOPIC
Bilirubin Urine: NEGATIVE
Glucose, UA: NEGATIVE mg/dL
Hgb urine dipstick: NEGATIVE
Ketones, ur: NEGATIVE mg/dL
Nitrite: POSITIVE — AB
Protein, ur: NEGATIVE mg/dL
Specific Gravity, Urine: 1.015 (ref 1.005–1.030)
pH: 5 (ref 5.0–8.0)

## 2020-11-08 NOTE — Consult Note (Addendum)
Cardiology Consultation:   Patient ID: KENZEY BIRKLAND MRN: [id number]; DOB: 11/14/1929  Admit date: 11/06/2020 Date of Consult: 11/08/2020  Primary Care Provider: Chesley Noon, MD [facility] Cardiologist: Virl Axe, MD  [facility] Electrophysiologist:  Virl Axe, MD    Patient Profile:   [name] is a 86 y.o. female with a hx of NICM, BiV ICD, atrial fib persistent, high grade AV block with PPM, on amiodarone, hyperthyroidism, anemia and lower ext edema, CKD 3-4  who is being seen today for the evaluation of abnormal echo on this admit for acute CHF at the request of Dr. Eliseo Squires.  History of Present Illness:   Ms. [name] with above hx with persistent atrial fib, NICM with last echo 03/09/20 with EF 25-30%  with severe hypokinesis/akinesis of mid/distal lateral, distal anterior, mid/distal inferior, mid/distal septal and apical walls.  Mild to mod MR, mild to mod TR.  This drop in Ef was felt to be pacemaker induced.  Her PPM for CHB was upgraded to BiV  A medtronic percepta CRT-P 05/2020.   meds were adjusted as well.  By 07/31/20 EF by echo was 55-60% RV function now normal.  Mild to mod MR with mild holosystolic prolapse of both leaflets of the  mitral valve.  Mod TR.  On recent visit to EP had lower ext edema and her lasix increased to 80 mg daily for 3 days then 60 mg daily.   She did not improve and presented to ER 11/06/20.   Her Cr was up to 1.92 BNP 1000, CXR with small B pl effusions.  Echo done 11/07/20 with EF 50-55%, mild LVH, RV systolic function is moderately reduced.  Mildly elevated PA systolic pressure and est. RV systolic pressure is 07.3.  Moderate MVR and significant prolapse of ant. Leaflet with posterior directed MR jet that appears moderate but  may be underestimating as eccentric jet and not well visualized on apical views. Could consider TEE or cardiac MRI for further evaluation.  Mod TR.  This is similar to Echo of 03/09/20 though her EF is still up  from that time.   Pt has been given lasix 80 mg IV on the 28th, and 40 mg on the 29th.  She remains on amiodarone 100 mg daily and coreg 6.25 BID. She continues on Xarelto.    Cr has now climbed from 1.92 to 2.33.  Na 140, K+ 4.1 GFR 20 today  BNP 1049  Hgb 10.1 down from 11.7 on admit Pts down to 123 from 146 on admit +UTI   EKG:  The EKG was personally reviewed and demonstrates:  AV BIV pacing on admit  Telemetry:  Telemetry was personally reviewed and demonstrates:  AV pacing   Device checked 11/01/20 with normal device function.  No mode switches, patient Bi- pacing 99.55% of the time.  Today feeling better, her urine is dark and still with lower ext edema but has improved.  Past Medical History:  Diagnosis Date  . Anxiety   . Aortic valve sclerosis   . Atrial fibrillation ([place])   . Basal cell carcinoma    hx; near eyes  . Chronic systolic CHF (congestive heart failure) (HCC)    Echocardiogram 03/2020: EF 25-30, mid-dist lat, dist ant, mid-dist inf, mid-dist septal and apical severe HK/AK, Gr 2 DD, mild reduced RVSF, RVSP 41.8, severe LAE, mod RAE, mild to mod MR, mild to mod TR, AoV sclerosis (no AS)  . GERD (gastroesophageal reflux disease)   . H/O  one miscarriage   . Hard of hearing    has hearing aides but does not wear   . History of hiatal hernia   . History of kidney stones   . Hyperlipidemia   . Hypertension   . Mitral valve regurgitation 05/2020   Moderate  . OA (osteoarthritis)    "right pointer; right hip" (01/05/2017)  . Obesity   . Renal insufficiency   . SSS (sick sinus syndrome) ([place]) 01/05/2017   a. s/p MDT dual chamber PPM   . Tinnitus   . Tricuspid valve regurgitation 05/2020   Mod  . Varicose veins   . Wears glasses     Past Surgical History:  Procedure Laterality Date  . BIV UPGRADE N/A 05/30/2020   Procedure: BIV PPM UPGRADE;  Surgeon: Deboraha Sprang, MD;  Location: [place] [facility];  Service: Cardiovascular;  Laterality: N/A;  . BREAST  CYST EXCISION Right 1977   "benign"  . CARDIOVERSION N/A 07/25/2016   Procedure: CARDIOVERSION;  Surgeon: Lelon Perla, MD;  Location: [facility] ENDOSCOPY;  Service: Cardiovascular;  Laterality: N/A;  . CARDIOVERSION N/A 01/20/2019   Procedure: CARDIOVERSION;  Surgeon: Buford Dresser, MD;  Location: [facility] ENDOSCOPY;  Service: Cardiovascular;  Laterality: N/A;  . CARDIOVERSION N/A 12/05/2019   Procedure: CARDIOVERSION;  Surgeon: Dorothy Spark, MD;  Location: [facility] ENDOSCOPY;  Service: Cardiovascular;  Laterality: N/A;  . CARDIOVERSION N/A 05/07/2020   Procedure: CARDIOVERSION;  Surgeon: Acie Fredrickson Wonda Cheng, MD;  Location: [place];  Service: Cardiovascular;  Laterality: N/A;  . CATARACT EXTRACTION W/ INTRAOCULAR LENS  IMPLANT, BILATERAL Bilateral   . DILATION AND CURETTAGE OF UTERUS    . NASAL SEPTUM SURGERY    . OVARIAN CYST REMOVAL  2002  . PACEMAKER IMPLANT N/A 01/05/2017   Procedure: Pacemaker Implant;  Surgeon: Will Meredith Leeds, MD;  Location: [place] CV LAB;  Service: Cardiovascular;  Laterality: N/A;  . TEE WITHOUT CARDIOVERSION N/A 12/16/2016   Procedure: TRANSESOPHAGEAL ECHOCARDIOGRAM (TEE);  Surgeon: Larey Dresser, MD;  Location: [place];  Service: Cardiovascular;  Laterality: N/A;  . TEE WITHOUT CARDIOVERSION N/A 01/20/2019   Procedure: TRANSESOPHAGEAL ECHOCARDIOGRAM (TEE);  Surgeon: Buford Dresser, MD;  Location: [facility] ENDOSCOPY;  Service: Cardiovascular;  Laterality: N/A;  . TONSILLECTOMY    . TOTAL HIP ARTHROPLASTY Left 12/18/2015   Procedure: LEFT TOTAL HIP ARTHROPLASTY ANTERIOR APPROACH;  Surgeon: Paralee Cancel, MD;  Location: [facility] ORS;  Service: Orthopedics;  Laterality: Left;     Home Medications:  Prior to Admission medications   Medication Sig Start Date End Date Taking? Authorizing Provider  acetaminophen (TYLENOL) 500 MG tablet Take 500-1,000 mg by mouth every 8 (eight) hours as needed for mild pain.   Yes [provider]  ALPRAZolam Duanne Moron) 0.5  MG tablet Take 0.5 mg by mouth at bedtime.   Yes [provider]  amiodarone (PACERONE) 200 MG tablet Take 0.5 tablets (100 mg total) by mouth daily. 08/07/20  Yes Deboraha Sprang, MD  busPIRone (BUSPAR) 5 MG tablet Take 5 mg by mouth 2 (two) times daily.   Yes [provider]  carvedilol (COREG) 6.25 MG tablet Take 1 tablet (6.25 mg total) by mouth 2 (two) times daily. 07/13/20  Yes Deboraha Sprang, MD  Cyanocobalamin (B-12) 2500 MCG TABS Take 2,500 mcg by mouth every other day.    Yes [provider]  furosemide (LASIX) 40 MG tablet Take 1.5 tablets (60 mg total) by mouth daily. 11/01/20  Yes Shirley Friar, PA-C  Magnesium 400  MG CAPS Take 400 mg by mouth 3 (three) times a week.    Yes [provider]  Multiple Vitamin (MULTIVITAMIN WITH MINERALS) TABS tablet Take 1 tablet by mouth daily.   Yes [provider]  Potassium 99 MG TABS Take 198 mg by mouth daily.   Yes [provider]  Propylene Glycol (SYSTANE COMPLETE) 0.6 % SOLN Place 1 drop into both eyes 2 (two) times daily as needed (for dryness).   Yes [provider]  XARELTO 15 MG TABS tablet TAKE 1 TABLET(15 MG) BY MOUTH DAILY WITH SUPPER Patient taking differently: Take 15 mg by mouth daily with supper. 07/23/20  Yes Deboraha Sprang, MD    Inpatient Medications: Scheduled Meds: . ALPRAZolam  0.5 mg Oral QHS  . amiodarone  100 mg Oral Daily  . busPIRone  5 mg Oral BID  . carvedilol  6.25 mg Oral BID  . potassium chloride  20 mEq Oral BID  . Rivaroxaban  15 mg Oral Q supper  . sodium chloride flush  3 mL Intravenous Q12H   Continuous Infusions: . sodium chloride     PRN Meds: sodium chloride, acetaminophen, ondansetron (ZOFRAN) IV, sodium chloride flush  Allergies:    Allergies  Allergen Reactions  . Ace Inhibitors Cough  . Amlodipine Other (See Comments) and Cough    Higher doses = VERY BAD COUGHING  . Celebrex [Celecoxib] Other (See Comments)    Reflux   . Codeine Nausea Only  . Rofecoxib Itching, Swelling and Other (See Comments)    Vioxx- legs swelling, itching  . Ether For Anesthesia [Ether] Other (See Comments) and Cough    Burning and dry cough  . Norvasc [Amlodipine Besylate] Other (See Comments) and Cough    Higher doses = VERY BAD COUGHING     Social History:   Social History   Socioeconomic History  . Marital status: Married    Spouse name: Not on file  . Number of children: Not on file  . Years of education: Not on file  . Highest education level: Not on file  Occupational History  . Not on file  Tobacco Use  . Smoking status: Never Smoker  . Smokeless tobacco: Never Used  Vaping Use  . Vaping Use: Never used  Substance and Sexual Activity  . Alcohol use: No  . Drug use: No  . Sexual activity: Not Currently  Other Topics Concern  . Not on file  Social History Narrative  . Not on file   Social Determinants of Health   Financial Resource Strain: Not on file  Food Insecurity: Not on file  Transportation Needs: Not on file  Physical Activity: Not on file  Stress: Not on file  Social Connections: Not on file  Intimate Partner Violence: Not on file    Family History:    Family History  Problem Relation Age of Onset  . Diabetes Mother   . Heart disease Father   . Hypertension Sister   . Hypertension Brother      ROS:  Please see the history of present illness.  General:no colds or fevers, no weight changes Skin:no rashes or ulcers HEENT:no blurred vision, no congestion CV:see HPI PUL:see HPI GI:no diarrhea constipation or melena, no indigestion GU:no hematuria, no dysuria MS:no joint pain, no claudication Neuro:no syncope, no lightheadedness Endo:no diabetes, no thyroid disease  All other ROS reviewed and negative.     Physical Exam/Data:   Vitals:   11/07/20 2139 11/08/20 0055 11/08/20 0515 11/08/20 1829  BP: (!) 118/57 107/63 110/71 107/61  Pulse: 62 60 90 60  Resp: 18 16 16    Temp:  97.6 F (36.4 C) (!) 97.5 F (36.4 C) 98.7 F (37.1 C)   TempSrc: Oral Oral Oral   SpO2: 98% 100% 97% 96%  Weight:   68.9 kg   Height:        Intake/Output Summary (Last 24 hours) at 11/08/2020 1334 Last data filed at 11/07/2020 2200 Gross per 24 hour  Intake 360 ml  Output 200 ml  Net 160 ml   Last 3 Weights 11/08/2020 11/07/2020 11/06/2020  Weight (lbs) 151 lb 12.8 oz 151 lb 3.2 oz 150 lb  Weight (kg) 68.856 kg 68.584 kg 68.04 kg     Body mass index is 27.76 kg/m.  General:  Well nourished, well developed, in no acute distress, but appears frail  HEENT: normal Lymph: no adenopathy Neck: no JVD sitting up in chair Endocrine:  No thryomegaly Vascular: No carotid bruits; pedal pulses 2+ bilaterally  Cardiac:  normal S1, S2; RRR; + murmur 2-3/6 Lungs:  clear to auscultation bilaterally, no wheezing, rhonchi or rales  Abd: soft, nontender, no hepatomegaly  Ext: 2+ lower ext edema, + varocosities Musculoskeletal:  No deformities, BUE and BLE strength normal and equal Skin: warm and dry  Neuro:  Alert and oriented X 3 MAE follows commands no focal abnormalities noted Psych:  Normal affect     Relevant CV Studies: 11/07/20  Echo  IMPRESSIONS    1. Left ventricular ejection fraction, by estimation, is 50 to 55%. The  left ventricle has low normal function. The left ventricle has no regional  wall motion abnormalities. There is mild left ventricular hypertrophy.  Left ventricular diastolic  parameters are indeterminate.  2. Right ventricular systolic function is moderately reduced. The right  ventricular size is moderately enlarged. There is mildly elevated  pulmonary artery systolic pressure. The estimated right ventricular  systolic pressure is 46.2 mmHg.  3. Right atrial size was severely dilated.  4. The mitral valve is degenerative. Moderate mitral annular  calcification. No evidence of mitral stenosis. Moderate mitral valve  regurgitation. There is  significant prolapse of the anterior leaflet with  posterior directed MR jet that appears moderate  but may be underestimating as eccentric jet and not well visualized on  apical views. Could consider TEE or cardiac MRI for further evaluation  5. Tricuspid valve regurgitation is moderate.  6. The aortic valve is tricuspid. Aortic valve regurgitation is not  visualized. Mild to moderate aortic valve sclerosis/calcification is  present, without any evidence of aortic stenosis.  7. The inferior vena cava is dilated in size with <50% respiratory  variability, suggesting right atrial pressure of 15 mmHg.   FINDINGS  Left Ventricle: Left ventricular ejection fraction, by estimation, is 50  to 55%. The left ventricle has low normal function. The left ventricle has  no regional wall motion abnormalities. The left ventricular internal  cavity size was normal in size.  There is mild left ventricular hypertrophy. Left ventricular diastolic  parameters are indeterminate.   Right Ventricle: The right ventricular size is moderately enlarged. Right  vetricular wall thickness was not well visualized. Right ventricular  systolic function is moderately reduced. There is mildly elevated  pulmonary artery systolic pressure. The  tricuspid regurgitant velocity is 2.66 m/s, and with an assumed right  atrial pressure of 15 mmHg, the estimated right ventricular systolic  pressure is 70.3 mmHg.   Left Atrium: Left atrial size was normal  in size.   Right Atrium: Right atrial size was severely dilated.   Pericardium: Trivial pericardial effusion is present.   Mitral Valve: The mitral valve is degenerative in appearance. Moderate  mitral annular calcification. Moderate mitral valve regurgitation. No  evidence of mitral valve stenosis. MV peak gradient, 6.2 mmHg. The mean  mitral valve gradient is 1.0 mmHg.   Tricuspid Valve: The tricuspid valve is normal in structure. Tricuspid  valve regurgitation is  moderate.   Aortic Valve: The aortic valve is tricuspid. Aortic valve regurgitation is  not visualized. Mild to moderate aortic valve sclerosis/calcification is  present, without any evidence of aortic stenosis. Aortic valve mean  gradient measures 3.0 mmHg. Aortic  valve peak gradient measures 5.6 mmHg. Aortic valve area, by VTI measures  2.84 cm.   Pulmonic Valve: The pulmonic valve was normal in structure. Pulmonic valve  regurgitation is mild to moderate.   Aorta: The aortic root and ascending aorta are structurally normal, with  no evidence of dilitation.   Venous: The inferior vena cava is dilated in size with less than 50%  respiratory variability, suggesting right atrial pressure of 15 mmHg.   IAS/Shunts: The interatrial septum was not well visualized.   Additional Comments: There is a small pleural effusion in the left lateral  region.     LEFT VENTRICLE  PLAX 2D  LVIDd:     4.30 cm   Diastology  LVIDs:     3.05 cm   LV e' medial:  3.55 cm/s  LV PW:     1.00 cm   LV E/e' medial: 32.1  LV IVS:    1.10 cm   LV e' lateral:  6.19 cm/s  LVOT diam:   2.00 cm   LV E/e' lateral: 18.4  LV SV:     71  LV SV Index:  41  LVOT Area:   3.14 cm    LV Volumes (MOD)  LV vol d, MOD A2C: 79.8 ml  LV vol d, MOD A4C: 57.8 ml  LV vol s, MOD A2C: 34.6 ml  LV vol s, MOD A4C: 26.5 ml  LV SV MOD A2C:   45.2 ml  LV SV MOD A4C:   57.8 ml  LV SV MOD BP:   39.8 ml   RIGHT VENTRICLE      IVC  RV S prime:   6.58 cm/s IVC diam: 2.50 cm  TAPSE (M-mode): 0.7 cm   LEFT ATRIUM       Index    RIGHT ATRIUM      Index  LA diam:    3.75 cm 2.17 cm/m RA Area:   28.90 cm  LA Vol (A2C):  40.5 ml 23.39 ml/m RA Volume:  98.10 ml 56.67 ml/m  LA Vol (A4C):  56.0 ml 32.35 ml/m  LA Biplane Vol: 49.4 ml 28.53 ml/m  AORTIC VALVE          PULMONIC VALVE  AV Area (Vmax):  3.05 cm  PR End Diast Vel: 2.32  msec  AV Area (Vmean):  2.88 cm  AV Area (VTI):   2.84 cm  AV Vmax:      118.50 cm/s  AV Vmean:     80.300 cm/s  AV VTI:      0.248 m  AV Peak Grad:   5.6 mmHg  AV Mean Grad:   3.0 mmHg  LVOT Vmax:     115.00 cm/s  LVOT Vmean:    73.700 cm/s  LVOT VTI:  0.225 m  LVOT/AV VTI ratio: 0.91    AORTA  Ao Root diam: 3.10 cm   MITRAL VALVE        TRICUSPID VALVE  MV Area (PHT): 3.77 cm   TR Peak grad:  28.3 mmHg  MV Peak grad: 6.2 mmHg   TR Vmax:    266.00 cm/s  MV Mean grad: 1.0 mmHg  MV Vmax:    1.24 m/s   SHUNTS  MV Vmean:   47.5 cm/s  Systemic VTI: 0.22 m  MV Decel Time: 201 msec   Systemic Diam: 2.00 cm  MR Peak grad: 66.3 mmHg  MR Mean grad: 37.0 mmHg  MR Vmax:   407.00 cm/s  MR Vmean:   282.0 cm/s  MV E velocity: 114.00    Echo 07/31/20 IMPRESSIONS    1. Left ventricular ejection fraction, by estimation, is 55 to 60%. The  left ventricle has normal function. The left ventricle has no regional  wall motion abnormalities. Left ventricular diastolic function could not  be evaluated.  2. Right ventricular systolic function is normal. The right ventricular  size is normal.  3. Left atrial size was severely dilated.  4. Right atrial size was moderately dilated.  5. The mitral valve is myxomatous. Mild to moderate mitral valve  regurgitation. There is mild holosystolic prolapse of both leaflets of the  mitral valve.  6. Tricuspid valve regurgitation is moderate.  7. Teh Right coronary cups has very limited/ no mobility . The aortic  valve is normal in structure. Aortic valve regurgitation is not  visualized. No aortic stenosis is present.   FINDINGS  Left Ventricle: Left ventricular ejection fraction, by estimation, is 55  to 60%. The left ventricle has normal function. The left ventricle has no  regional wall motion abnormalities. The left ventricular internal cavity  size was  normal in size. There is  no left ventricular hypertrophy. Left ventricular diastolic function  could not be evaluated due to atrial fibrillation. Left ventricular  diastolic function could not be evaluated.   Right Ventricle: The right ventricular size is normal. No increase in  right ventricular wall thickness. Right ventricular systolic function is  normal.   Left Atrium: Left atrial size was severely dilated.   Right Atrium: Right atrial size was moderately dilated.   Pericardium: There is no evidence of pericardial effusion.   Mitral Valve: The mitral valve is myxomatous. There is mild holosystolic  prolapse of both leaflets of the mitral valve. Mild to moderate mitral  valve regurgitation.   Tricuspid Valve: The tricuspid valve is normal in structure. Tricuspid  valve regurgitation is moderate.   Aortic Valve: Teh Right coronary cups has very limited/ no mobility. The  aortic valve is normal in structure. Aortic valve regurgitation is not  visualized. No aortic stenosis is present.   Pulmonic Valve: The pulmonic valve was normal in structure. Pulmonic valve  regurgitation is mild. No evidence of pulmonic stenosis.   Aorta: The aortic root and ascending aorta are structurally normal, with  no evidence of dilitation.   IAS/Shunts: The atrial septum is grossly normal.   Additional Comments: A pacer wire is visualized.     LEFT VENTRICLE  PLAX 2D  LVIDd:     4.00 cm Diastology  LVIDs:     3.00 cm LV e' medial:  6.31 cm/s  LV PW:     1.60 cm LV E/e' medial: 18.7  LV IVS:    1.00 cm LV e' lateral:  7.83 cm/s  LVOT diam:   2.10 cm LV E/e' lateral: 15.1  LV SV:     30  LV SV Index:  18  LVOT Area:   3.46 cm     RIGHT VENTRICLE       IVC  RV S prime:   10.30 cm/s IVC diam: 2.20 cm  TAPSE (M-mode): 1.6 cm  RVSP:      36.4 mmHg   LEFT ATRIUM       Index    RIGHT ATRIUM      Index  LA diam:    4.90  cm 2.96 cm/m RA Pressure: 3.00 mmHg  LA Vol (A2C):  90.7 ml 54.75 ml/m RA Area:   25.40 cm  LA Vol (A4C):  74.2 ml 44.79 ml/m RA Volume:  84.30 ml 50.88 ml/m  LA Biplane Vol: 86.2 ml 52.03 ml/m  AORTIC VALVE  LVOT Vmax:  45.60 cm/s  LVOT Vmean: 30.600 cm/s  LVOT VTI:  0.086 m    AORTA  Ao Root diam: 3.20 cm  Ao Asc diam: 3.30 cm   MV E velocity: 118.00 cm/s TRICUSPID VALVE               TR Peak grad:  33.4 mmHg               TR Vmax:    289.00 cm/s               Estimated RAP: 3.00 mmHg               RVSP:      36.4 mmHg                 SHUNTS               Systemic VTI: 0.09 m               Systemic Diam: 2.10 cm  Laboratory Data:  High Sensitivity Troponin:  No results for input(s): TROPONINIHS in the last 720 hours.   Chemistry Recent Labs  Lab 11/06/20 1326 11/07/20 0530 11/08/20 0155  NA 138 140 140  K 3.4* 3.9 4.1  CL 98 104 102  CO2 26 24 29   GLUCOSE 105* 124* 109*  BUN 40* 42* 46*  CREATININE 1.92* 1.91* 2.33*  CALCIUM 9.6 9.3 9.2  GFRNONAA 25* 25* 20*  ANIONGAP 14 12 9     Recent Labs  Lab 11/08/20 0155  PROT 5.8*  ALBUMIN 2.8*  AST 31  ALT 30  ALKPHOS 79  BILITOT 1.8*   Hematology Recent Labs  Lab 11/06/20 1326 11/08/20 0155  WBC 4.2 4.6  RBC 4.00 3.61*  HGB 11.7* 10.1*  HCT 38.4 35.4*  MCV 96.0 98.1  MCH 29.3 28.0  MCHC 30.5 28.5*  RDW 16.2* 16.3*  PLT 146* 123*   BNP Recent Labs  Lab 11/06/20 1745  BNP 1,049.9*    DDimer No results for input(s): DDIMER in the last 168 hours.   Radiology/Studies:  DG Chest 2 View  Result Date: 11/06/2020 CLINICAL DATA:  Increasing shortness of breath over the past few weeks. EXAM: CHEST - 2 VIEW COMPARISON:  PA and lateral chest 05/30/2020. FINDINGS: There is cardiomegaly without edema. Small bilateral pleural effusions basilar atelectasis are seen. Pacing  device in place. No pneumothorax. IMPRESSION: Small bilateral pleural effusions and basilar atelectasis. Cardiomegaly without edema. Electronically Signed   By: Inge Rise M.D.   On: 11/06/2020 13:52   ECHOCARDIOGRAM COMPLETE  Result Date: 11/07/2020    ECHOCARDIOGRAM  REPORT   Patient Name:   [name] Date of Exam: 11/07/2020 Medical Rec #:  [id number]      Height:       64.0 in Accession #:    [id number]     Weight:       150.0 lb Date of Birth:  1930/01/21      BSA:          1.731 m Patient Age:    32 years       BP:           107/62 mmHg Patient Gender: F              HR:           60 bpm. Exam Location:  Inpatient Procedure: 2D Echo, Cardiac Doppler and Color Doppler Indications:    I50.40* Unspecified combined systolic (congestive) and diastolic                 (congestive) heart failure  History:        Patient has prior history of Echocardiogram examinations, most                 recent 07/31/2020. Abnormal ECG and Pacemaker, Mitral Valve                 Disease, Arrythmias:Atrial Fibrillation, Signs/Symptoms:Dyspnea;                 Risk Factors:Hypertension. Edema. Mitral valve prolapse.                 Hypoxia.  Sonographer:    Roseanna Rainbow RDCS Referring Phys: [phone] JARED M GARDNER  Sonographer Comments: Technically difficult study due to poor echo windows. IMPRESSIONS  1. Left ventricular ejection fraction, by estimation, is 50 to 55%. The left ventricle has low normal function. The left ventricle has no regional wall motion abnormalities. There is mild left ventricular hypertrophy. Left ventricular diastolic parameters are indeterminate.  2. Right ventricular systolic function is moderately reduced. The right ventricular size is moderately enlarged. There is mildly elevated pulmonary artery systolic pressure. The estimated right ventricular systolic pressure is 14.9 mmHg.  3. Right atrial size was severely dilated.  4. The mitral valve is degenerative. Moderate mitral annular calcification.  No evidence of mitral stenosis. Moderate mitral valve regurgitation. There is significant prolapse of the anterior leaflet with posterior directed MR jet that appears moderate but may be underestimating as eccentric jet and not well visualized on apical views. Could consider TEE or cardiac MRI for further evaluation  5. Tricuspid valve regurgitation is moderate.  6. The aortic valve is tricuspid. Aortic valve regurgitation is not visualized. Mild to moderate aortic valve sclerosis/calcification is present, without any evidence of aortic stenosis.  7. The inferior vena cava is dilated in size with <50% respiratory variability, suggesting right atrial pressure of 15 mmHg. FINDINGS  Left Ventricle: Left ventricular ejection fraction, by estimation, is 50 to 55%. The left ventricle has low normal function. The left ventricle has no regional wall motion abnormalities. The left ventricular internal cavity size was normal in size. There is mild left ventricular hypertrophy. Left ventricular diastolic parameters are indeterminate. Right Ventricle: The right ventricular size is moderately enlarged. Right vetricular wall thickness was not well visualized. Right ventricular systolic function is moderately reduced. There is mildly elevated pulmonary artery systolic pressure. The tricuspid regurgitant velocity is 2.66 m/s, and with an assumed right atrial pressure of 15 mmHg, the estimated right ventricular systolic pressure  is 43.3 mmHg. Left Atrium: Left atrial size was normal in size. Right Atrium: Right atrial size was severely dilated. Pericardium: Trivial pericardial effusion is present. Mitral Valve: The mitral valve is degenerative in appearance. Moderate mitral annular calcification. Moderate mitral valve regurgitation. No evidence of mitral valve stenosis. MV peak gradient, 6.2 mmHg. The mean mitral valve gradient is 1.0 mmHg. Tricuspid Valve: The tricuspid valve is normal in structure. Tricuspid valve regurgitation is  moderate. Aortic Valve: The aortic valve is tricuspid. Aortic valve regurgitation is not visualized. Mild to moderate aortic valve sclerosis/calcification is present, without any evidence of aortic stenosis. Aortic valve mean gradient measures 3.0 mmHg. Aortic valve peak gradient measures 5.6 mmHg. Aortic valve area, by VTI measures 2.84 cm. Pulmonic Valve: The pulmonic valve was normal in structure. Pulmonic valve regurgitation is mild to moderate. Aorta: The aortic root and ascending aorta are structurally normal, with no evidence of dilitation. Venous: The inferior vena cava is dilated in size with less than 50% respiratory variability, suggesting right atrial pressure of 15 mmHg. IAS/Shunts: The interatrial septum was not well visualized. Additional Comments: There is a small pleural effusion in the left lateral region.  LEFT VENTRICLE PLAX 2D LVIDd:         4.30 cm     Diastology LVIDs:         3.05 cm     LV e' medial:    3.55 cm/s LV PW:         1.00 cm     LV E/e' medial:  32.1 LV IVS:        1.10 cm     LV e' lateral:   6.19 cm/s LVOT diam:     2.00 cm     LV E/e' lateral: 18.4 LV SV:         71 LV SV Index:   41 LVOT Area:     3.14 cm  LV Volumes (MOD) LV vol d, MOD A2C: 79.8 ml LV vol d, MOD A4C: 57.8 ml LV vol s, MOD A2C: 34.6 ml LV vol s, MOD A4C: 26.5 ml LV SV MOD A2C:     45.2 ml LV SV MOD A4C:     57.8 ml LV SV MOD BP:      39.8 ml RIGHT VENTRICLE            IVC RV S prime:     6.58 cm/s  IVC diam: 2.50 cm TAPSE (M-mode): 0.7 cm LEFT ATRIUM             Index       RIGHT ATRIUM           Index LA diam:        3.75 cm 2.17 cm/m  RA Area:     28.90 cm LA Vol (A2C):   40.5 ml 23.39 ml/m RA Volume:   98.10 ml  56.67 ml/m LA Vol (A4C):   56.0 ml 32.35 ml/m LA Biplane Vol: 49.4 ml 28.53 ml/m  AORTIC VALVE                   PULMONIC VALVE AV Area (Vmax):    3.05 cm    PR End Diast Vel: 2.32 msec AV Area (Vmean):   2.88 cm AV Area (VTI):     2.84 cm AV Vmax:           118.50 cm/s AV Vmean:           80.300 cm/s AV VTI:  0.248 m AV Peak Grad:      5.6 mmHg AV Mean Grad:      3.0 mmHg LVOT Vmax:         115.00 cm/s LVOT Vmean:        73.700 cm/s LVOT VTI:          0.225 m LVOT/AV VTI ratio: 0.91  AORTA Ao Root diam: 3.10 cm MITRAL VALVE                TRICUSPID VALVE MV Area (PHT): 3.77 cm     TR Peak grad:   28.3 mmHg MV Peak grad:  6.2 mmHg     TR Vmax:        266.00 cm/s MV Mean grad:  1.0 mmHg MV Vmax:       1.24 m/s     SHUNTS MV Vmean:      47.5 cm/s    Systemic VTI:  0.22 m MV Decel Time: 201 msec     Systemic Diam: 2.00 cm MR Peak grad: 66.3 mmHg MR Mean grad: 37.0 mmHg MR Vmax:      407.00 cm/s MR Vmean:     282.0 cm/s MV E velocity: 114.00 cm/s Oswaldo Milian MD Electronically signed by Oswaldo Milian MD Signature Date/Time: 11/07/2020/8:59:56 PM    Final      Assessment and Plan:   1. Abnormal echo with stable EF/pleural effusion but now with decrease in RV function still with moderate MR and TR --support stockings, thoracentesis may be beneficial.  Possible RHC  Dr. Percival Spanish to see.  2. Acute on chronic CHF diuresed neg 90 ml and wt no change,  but now with Cr up from 1.92 to 2.33.  EF still stable, support stockings would be beneficial.   3. 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 persistent atrial fib now AV pacing.  On xarelto no bleeding chronic anemia. And amiodarone.  5. AKI diuretic held Cr now 2.33.  (Cr in July 1.70 to 2.08)  6. HTN on coreg  BP 107/61 to 110/71   7. CRT with last interrogation 11/01/20 and AV pacing 99.5% of the time.  8. Anemia Hgb and plts decreased from admit Hgb 11.7 to 10.1 and plts 146 to 123  No prior thrombocytopenia.  9. Pt developed hyperthyroidism with amiodarone and amiodarone decreased  With improvement of TSH   New York Heart Association (NYHA) Functional Class NYHA Class III   CHA2DS2-VASc Score = 5  This indicates a 7.2% annual risk of stroke. The patient's score is based  upon: CHF History: Yes HTN History: Yes Diabetes History: No Stroke History: No Vascular Disease History: No Age Score: 2 Gender Score: 1     For questions or updates, please contact [place] Please consult [email or web address] for contact info under    Signed, Cecilie Kicks, NP  11/08/2020 1:34 PM   History and all data above reviewed.  Patient examined.  I agree with the findings as above.   This absolutely lovely lady was admitted with increased leg swelling and weight gain despite changes to her diuretics at home.  She has not abused salt.  She has been compliant with keeping her feet up, watching fluid and was using compression socks for the most part.  She has not had increased SOB or PND and she does do activities such as shopping and cooking for her 52 year old husband.  She sleeps chronically in a chair because  of hip pain.  She has not had cough fevers or chills.  Echo this admission does show decreased RV function that is perhaps progressed compared with an EKG earlier this year.  (I reviewed both sets of images.)  Attempts at diuresis have lead to AKI.   The patient exam reveals COR:RRR  ,  Lungs: Decreased breath  ,  Abd: Positive bowel sounds, no rebound no guarding, Ext Moderate leg edema  .  All available labs, radiology testing, previous records reviewed. Agree with documented assessment and plan.     Acute on chronic right and left heart failure systolic and diastolic:  The predominant symptoms are lower extremity swelling.  Dyspnea has not been the biggest complaint.  At this point I would not give IV diuresis as she is preload dependent and will likely get progressive renal insufficiency.  She is oxygenating well and not particularly dyspneic.  Rather I would use compression stockings.  We will keep her feet elevated.  I would suggest repeating a chest x-ray as I do see evidence of pleural effusion on the echo and it was moderate on the left on the admission chest x-ray.  If this  seems to be increased I might suggest thoracentesis.  Of note she has had predominantly AV paced rhythm so this is not persistent fibrillation although it might interrogate her device understand whether there is been increased burden of this. Jeneen Rinks Briley Bumgarner  4:05 PM  11/08/2020

## 2020-11-08 NOTE — Discharge Instructions (Signed)

## 2020-11-08 NOTE — Progress Notes (Signed)
Progress Note    [name]  DUK:[id number] DOB: 10-Jan-1930  DOA: 11/06/2020 PCP: Chesley Noon, MD    Brief Narrative:    Medical records reviewed and are as summarized below:  [name] is an 86 y.o. female with medical history significant of A.Fib on amiodarone and Xarelto, HTN, SSS s/p PPM.  CHF, CKD. Pt with h/o dCHF previously, echo in April 2021 showed newly reduced EF 25-30%, cards suspected this possibly due to her being paced 100% of the time.  Looks like adjustments were made, repeat echo in Sept 2021 showed normal EF. Last month her PCP noted that creat had increased to 1.68, was concerned that she was dehydrated and so stopped her lasix. Since that time she has had worsening edema now progressed to anasarca, SOB, DOE, orthopnea. Seen in cards office and had labs drawn last week on 12/23.  Cards put her on lasix 80mg  daily for 3 days then back to 60mg  daily.   Assessment/Plan:   Principal Problem:   CKD (chronic kidney disease) stage 3, GFR 30-59 ml/min (HCC) Active Problems:   Hypertension   Sick sinus syndrome (HCC)   Paroxysmal atrial fibrillation (HCC)   Biventricular cardiac pacemaker in situ   Acute on chronic combined systolic and diastolic CHF (congestive heart failure) (HCC)   AKI (acute kidney injury) ([place])   Dyspnea   Tricuspid valve regurgitation    Acute on chronic CHF - -echo: There is significant prolapse of the anterior leaflet with  posterior directed MR jet that appears moderate  but may be underestimating as eccentric jet and not well visualized on  apical views. Could consider TEE or cardiac MRI for further evaluation -IV lasix on hold-- ? Low albumin and 3rd spacing/intravascularly dry -cards consult -daily weights/Strict intake and output -1268ml fluid restriction -compression hose (wears at home)  AKI on CKD 3b - -IV lasix on hold for worsening renal failure and hypotension  SSS and biventricular PPM -  -follows with  Dr. Caryl Comes  HTN - -Only on coreg at this point -BP actually on low side  PAF - -Cont amiodarone -Cont xarelto    Family Communication/Anticipated D/C date and plan/Code Status   DVT prophylaxis: xarelto Code Status: Full Code.  Disposition Plan: Status is: inpt  The patient will require care spanning > 2 midnights and should be moved to inpatient because: Inpatient level of care appropriate due to severity of illness  Dispo: The patient is from: Home              Anticipated d/c is to: Home              Anticipated d/c date is: 2 days              Patient currently is not medically stable to d/c.         Medical Consultants:    cards     Subjective:   Gets very SBO while getting up moving  Objective:    Vitals:   11/07/20 2139 11/08/20 0055 11/08/20 0515 11/08/20 0947  BP: (!) 118/57 107/63 110/71 107/61  Pulse: 62 60 90 60  Resp: 18 16 16    Temp: 97.6 F (36.4 C) (!) 97.5 F (36.4 C) 98.7 F (37.1 C)   TempSrc: Oral Oral Oral   SpO2: 98% 100% 97% 96%  Weight:   68.9 kg   Height:        Intake/Output Summary (Last 24 hours) at 11/08/2020 1448  Last data filed at 11/07/2020 2200 Gross per 24 hour  Intake 360 ml  Output 200 ml  Net 160 ml   Filed Weights   11/06/20 1325 11/07/20 1022 11/08/20 0515  Weight: 68 kg 68.6 kg 68.9 kg    Exam:   General: Appearance:     Overweight female in no acute distress     Lungs:     On O2, diminished, now heezing, respirations unlabored  Heart:    Normal heart rate.   MS:   All extremities are intact. +LE Edema  Neurologic:   Awake, alert, oriented x 3. No apparent focal neurological           defect.     Data Reviewed:   I have personally reviewed following labs and imaging studies:  Labs: Labs show the following:   Basic Metabolic Panel: Recent Labs  Lab 11/06/20 1326 11/07/20 0530 11/08/20 0155  NA 138 140 140  K 3.4* 3.9 4.1  CL 98 104 102  CO2 26 24 29   GLUCOSE 105* 124* 109*  BUN  40* 42* 46*  CREATININE 1.92* 1.91* 2.33*  CALCIUM 9.6 9.3 9.2   GFR Estimated Creatinine Clearance: 15.2 mL/min (A) (by C-G formula based on SCr of 2.33 mg/dL (H)). Liver Function Tests: Recent Labs  Lab 11/08/20 0155  AST 31  ALT 30  ALKPHOS 79  BILITOT 1.8*  PROT 5.8*  ALBUMIN 2.8*   No results for input(s): LIPASE, AMYLASE in the last 168 hours. No results for input(s): AMMONIA in the last 168 hours. Coagulation profile No results for input(s): INR, PROTIME in the last 168 hours.  CBC: Recent Labs  Lab 11/06/20 1326 11/08/20 0155  WBC 4.2 4.6  HGB 11.7* 10.1*  HCT 38.4 35.4*  MCV 96.0 98.1  PLT 146* 123*   Cardiac Enzymes: No results for input(s): CKTOTAL, CKMB, CKMBINDEX, TROPONINI in the last 168 hours. BNP (last 3 results) Recent Labs    03/02/20 0930  PROBNP 3,278*   CBG: No results for input(s): GLUCAP in the last 168 hours. D-Dimer: No results for input(s): DDIMER in the last 72 hours. Hgb A1c: No results for input(s): HGBA1C in the last 72 hours. Lipid Profile: No results for input(s): CHOL, HDL, LDLCALC, TRIG, CHOLHDL, LDLDIRECT in the last 72 hours. Thyroid function studies: No results for input(s): TSH, T4TOTAL, T3FREE, THYROIDAB in the last 72 hours.  Invalid input(s): FREET3 Anemia work up: No results for input(s): VITAMINB12, FOLATE, FERRITIN, TIBC, IRON, RETICCTPCT in the last 72 hours. Sepsis Labs: Recent Labs  Lab 11/06/20 1326 11/08/20 0155  WBC 4.2 4.6    Microbiology Recent Results (from the past 240 hour(s))  SARS CORONAVIRUS 2 (TAT 6-24 HRS) Nasopharyngeal Nasopharyngeal Swab     Status: None   Collection Time: 11/06/20  8:27 PM   Specimen: Nasopharyngeal Swab  Result Value Ref Range Status   SARS Coronavirus 2 NEGATIVE NEGATIVE Final    Comment: (NOTE) SARS-CoV-2 target nucleic acids are NOT DETECTED.  The SARS-CoV-2 RNA is generally detectable in upper and lower respiratory specimens during the acute phase of  infection. Negative results do not preclude SARS-CoV-2 infection, do not rule out co-infections with other pathogens, and should not be used as the sole basis for treatment or other patient management decisions. Negative results must be combined with clinical observations, patient history, and epidemiological information. The expected result is Negative.  Fact Sheet for Patients: SugarRoll.be  Fact Sheet for Healthcare Providers: https://www.woods-mathews.com/  This test is not yet  approved or cleared by the [place] and  has been authorized for detection and/or diagnosis of SARS-CoV-2 by FDA under an Emergency Use Authorization (EUA). This EUA will remain  in effect (meaning this test can be used) for the duration of the COVID-19 declaration under Se ction 564(b)(1) of the Act, 21 U.S.C. section 360bbb-3(b)(1), unless the authorization is terminated or revoked sooner.  Performed at [facility], [place] [address], [place], [place] [address]     Procedures and diagnostic studies:  ECHOCARDIOGRAM COMPLETE  Result Date: 11/07/2020    ECHOCARDIOGRAM REPORT   Patient Name:   [name] Date of Exam: 11/07/2020 Medical Rec #:  [id number]      Height:       64.0 in Accession #:    [id number]     Weight:       150.0 lb Date of Birth:  [birth year]      BSA:          1.731 m Patient Age:    14 years       BP:           107/62 mmHg Patient Gender: F              HR:           60 bpm. Exam Location:  Inpatient Procedure: 2D Echo, Cardiac Doppler and Color Doppler Indications:    I50.40* Unspecified combined systolic (congestive) and diastolic                 (congestive) heart failure  History:        Patient has prior history of Echocardiogram examinations, most                 recent 07/31/2020. Abnormal ECG and Pacemaker, Mitral Valve                 Disease, Arrythmias:Atrial Fibrillation, Signs/Symptoms:Dyspnea;                 Risk  Factors:Hypertension. Edema. Mitral valve prolapse.                 Hypoxia.  Sonographer:    Roseanna Rainbow RDCS Referring Phys: [phone] JARED M GARDNER  Sonographer Comments: Technically difficult study due to poor echo windows. IMPRESSIONS  1. Left ventricular ejection fraction, by estimation, is 50 to 55%. The left ventricle has low normal function. The left ventricle has no regional wall motion abnormalities. There is mild left ventricular hypertrophy. Left ventricular diastolic parameters are indeterminate.  2. Right ventricular systolic function is moderately reduced. The right ventricular size is moderately enlarged. There is mildly elevated pulmonary artery systolic pressure. The estimated right ventricular systolic pressure is 83.6 mmHg.  3. Right atrial size was severely dilated.  4. The mitral valve is degenerative. Moderate mitral annular calcification. No evidence of mitral stenosis. Moderate mitral valve regurgitation. There is significant prolapse of the anterior leaflet with posterior directed MR jet that appears moderate but may be underestimating as eccentric jet and not well visualized on apical views. Could consider TEE or cardiac MRI for further evaluation  5. Tricuspid valve regurgitation is moderate.  6. The aortic valve is tricuspid. Aortic valve regurgitation is not visualized. Mild to moderate aortic valve sclerosis/calcification is present, without any evidence of aortic stenosis.  7. The inferior vena cava is dilated in size with <50% respiratory variability, suggesting right atrial pressure of 15 mmHg. FINDINGS  Left Ventricle: Left ventricular ejection fraction,  by estimation, is 50 to 55%. The left ventricle has low normal function. The left ventricle has no regional wall motion abnormalities. The left ventricular internal cavity size was normal in size. There is mild left ventricular hypertrophy. Left ventricular diastolic parameters are indeterminate. Right Ventricle: The right ventricular  size is moderately enlarged. Right vetricular wall thickness was not well visualized. Right ventricular systolic function is moderately reduced. There is mildly elevated pulmonary artery systolic pressure. The tricuspid regurgitant velocity is 2.66 m/s, and with an assumed right atrial pressure of 15 mmHg, the estimated right ventricular systolic pressure is 29.5 mmHg. Left Atrium: Left atrial size was normal in size. Right Atrium: Right atrial size was severely dilated. Pericardium: Trivial pericardial effusion is present. Mitral Valve: The mitral valve is degenerative in appearance. Moderate mitral annular calcification. Moderate mitral valve regurgitation. No evidence of mitral valve stenosis. MV peak gradient, 6.2 mmHg. The mean mitral valve gradient is 1.0 mmHg. Tricuspid Valve: The tricuspid valve is normal in structure. Tricuspid valve regurgitation is moderate. Aortic Valve: The aortic valve is tricuspid. Aortic valve regurgitation is not visualized. Mild to moderate aortic valve sclerosis/calcification is present, without any evidence of aortic stenosis. Aortic valve mean gradient measures 3.0 mmHg. Aortic valve peak gradient measures 5.6 mmHg. Aortic valve area, by VTI measures 2.84 cm. Pulmonic Valve: The pulmonic valve was normal in structure. Pulmonic valve regurgitation is mild to moderate. Aorta: The aortic root and ascending aorta are structurally normal, with no evidence of dilitation. Venous: The inferior vena cava is dilated in size with less than 50% respiratory variability, suggesting right atrial pressure of 15 mmHg. IAS/Shunts: The interatrial septum was not well visualized. Additional Comments: There is a small pleural effusion in the left lateral region.  LEFT VENTRICLE PLAX 2D LVIDd:         4.30 cm     Diastology LVIDs:         3.05 cm     LV e' medial:    3.55 cm/s LV PW:         1.00 cm     LV E/e' medial:  32.1 LV IVS:        1.10 cm     LV e' lateral:   6.19 cm/s LVOT diam:     2.00  cm     LV E/e' lateral: 18.4 LV SV:         71 LV SV Index:   41 LVOT Area:     3.14 cm  LV Volumes (MOD) LV vol d, MOD A2C: 79.8 ml LV vol d, MOD A4C: 57.8 ml LV vol s, MOD A2C: 34.6 ml LV vol s, MOD A4C: 26.5 ml LV SV MOD A2C:     45.2 ml LV SV MOD A4C:     57.8 ml LV SV MOD BP:      39.8 ml RIGHT VENTRICLE            IVC RV S prime:     6.58 cm/s  IVC diam: 2.50 cm TAPSE (M-mode): 0.7 cm LEFT ATRIUM             Index       RIGHT ATRIUM           Index LA diam:        3.75 cm 2.17 cm/m  RA Area:     28.90 cm LA Vol (A2C):   40.5 ml 23.39 ml/m RA Volume:   98.10 ml  56.67 ml/m LA Vol (A4C):  56.0 ml 32.35 ml/m LA Biplane Vol: 49.4 ml 28.53 ml/m  AORTIC VALVE                   PULMONIC VALVE AV Area (Vmax):    3.05 cm    PR End Diast Vel: 2.32 msec AV Area (Vmean):   2.88 cm AV Area (VTI):     2.84 cm AV Vmax:           118.50 cm/s AV Vmean:          80.300 cm/s AV VTI:            0.248 m AV Peak Grad:      5.6 mmHg AV Mean Grad:      3.0 mmHg LVOT Vmax:         115.00 cm/s LVOT Vmean:        73.700 cm/s LVOT VTI:          0.225 m LVOT/AV VTI ratio: 0.91  AORTA Ao Root diam: 3.10 cm MITRAL VALVE                TRICUSPID VALVE MV Area (PHT): 3.77 cm     TR Peak grad:   28.3 mmHg MV Peak grad:  6.2 mmHg     TR Vmax:        266.00 cm/s MV Mean grad:  1.0 mmHg MV Vmax:       1.24 m/s     SHUNTS MV Vmean:      47.5 cm/s    Systemic VTI:  0.22 m MV Decel Time: 201 msec     Systemic Diam: 2.00 cm MR Peak grad: 66.3 mmHg MR Mean grad: 37.0 mmHg MR Vmax:      407.00 cm/s MR Vmean:     282.0 cm/s MV E velocity: 114.00 cm/s Oswaldo Milian MD Electronically signed by Oswaldo Milian MD Signature Date/Time: 11/07/2020/8:59:56 PM    Final     Medications:   . ALPRAZolam  0.5 mg Oral QHS  . amiodarone  100 mg Oral Daily  . busPIRone  5 mg Oral BID  . carvedilol  6.25 mg Oral BID  . potassium chloride  20 mEq Oral BID  . Rivaroxaban  15 mg Oral Q supper  . sodium chloride flush  3 mL Intravenous  Q12H   Continuous Infusions: . sodium chloride       LOS: 0 days   Geradine Girt  [facility]   How to contact the [facility] Attending or Consulting provider [place] or covering provider during after hours [place], for this patient?  1. Check the care team in [facility] and look for a) attending/consulting [facility] provider listed and b) the [facility] team listed 2. Log into [email or web address] and use [place]'s universal password to access. If you do not have the password, please contact the hospital operator. 3. Locate the [facility] provider you are looking for under [facility] and page to a number that you can be directly reached. 4. If you still have difficulty reaching the provider, please page the [facility] (Director on Call) for the Hospitalists listed on amion for assistance.  11/08/2020, 2:48 PM

## 2020-11-09 DIAGNOSIS — R0600 Dyspnea, unspecified: Secondary | ICD-10-CM | POA: Diagnosis not present

## 2020-11-09 DIAGNOSIS — N1832 Chronic kidney disease, stage 3b: Secondary | ICD-10-CM | POA: Diagnosis not present

## 2020-11-09 DIAGNOSIS — I5043 Acute on chronic combined systolic (congestive) and diastolic (congestive) heart failure: Secondary | ICD-10-CM | POA: Diagnosis not present

## 2020-11-09 LAB — BASIC METABOLIC PANEL
Anion gap: 7 (ref 5–15)
BUN: 44 mg/dL — ABNORMAL HIGH (ref 8–23)
CO2: 27 mmol/L (ref 22–32)
Calcium: 9 mg/dL (ref 8.9–10.3)
Chloride: 105 mmol/L (ref 98–111)
Creatinine, Ser: 1.93 mg/dL — ABNORMAL HIGH (ref 0.44–1.00)
GFR, Estimated: 25 mL/min — ABNORMAL LOW (ref >60)
Glucose, Bld: 103 mg/dL — ABNORMAL HIGH (ref 70–99)
Potassium: 4.2 mmol/L (ref 3.5–5.1)
Sodium: 139 mmol/L (ref 135–145)

## 2020-11-09 LAB — CBC
HCT: 35.2 % — ABNORMAL LOW (ref 36.0–46.0)
Hemoglobin: 10.4 g/dL — ABNORMAL LOW (ref 12.0–15.0)
MCH: 28.7 pg (ref 26.0–34.0)
MCHC: 29.5 g/dL — ABNORMAL LOW (ref 30.0–36.0)
MCV: 97 fL (ref 80.0–100.0)
Platelets: 127 10*3/uL — ABNORMAL LOW (ref 150–400)
RBC: 3.63 MIL/uL — ABNORMAL LOW (ref 3.87–5.11)
RDW: 16.1 % — ABNORMAL HIGH (ref 11.5–15.5)
WBC: 3.8 10*3/uL — ABNORMAL LOW (ref 4.0–10.5)
nRBC: 0 % (ref 0.0–0.2)

## 2020-11-09 MED ORDER — AMIODARONE HCL 200 MG PO TABS
200.0000 mg | ORAL_TABLET | Freq: Every day | ORAL | Status: DC
  Administered 2020-11-10: 200 mg via ORAL
  Filled 2020-11-09: qty 1

## 2020-11-09 MED ORDER — FUROSEMIDE 10 MG/ML IJ SOLN
40.0000 mg | Freq: Two times a day (BID) | INTRAMUSCULAR | Status: DC
  Administered 2020-11-09 – 2020-11-11 (×4): 40 mg via INTRAVENOUS
  Filled 2020-11-09 (×3): qty 4

## 2020-11-09 NOTE — Progress Notes (Signed)
Progress Note  [name] Date of Encounter: 11/09/2020  Primary Cardiologist:   Virl Axe, MD   Subjective   No pain.  No SOB above baseline.   Inpatient Medications    Scheduled Meds: . ALPRAZolam  0.5 mg Oral QHS  . amiodarone  100 mg Oral Daily  . busPIRone  5 mg Oral BID  . carvedilol  6.25 mg Oral BID  . potassium chloride  20 mEq Oral BID  . Rivaroxaban  15 mg Oral Q supper  . sodium chloride flush  3 mL Intravenous Q12H   Continuous Infusions: . sodium chloride     PRN Meds: sodium chloride, acetaminophen, ondansetron (ZOFRAN) IV, sodium chloride flush   Vital Signs    Vitals:   11/08/20 2054 11/08/20 2235 11/09/20 0442 11/09/20 0825  BP: 115/80 112/68 98/66 (!) 98/6  Pulse: (!) 101 69 71 61  Resp:   18 18  Temp: (!) 97.4 F (36.3 C)  (!) 97.5 F (36.4 C) (!) 97.5 F (36.4 C)  TempSrc: Oral  Oral Oral  SpO2: 100% 93% 97% 93%  Weight:   69.5 kg   Height:        Intake/Output Summary (Last 24 hours) at 11/09/2020 0952 Last data filed at 11/09/2020 0800 Gross per 24 hour  Intake 240 ml  Output 200 ml  Net 40 ml   Filed Weights   11/07/20 1022 11/08/20 0515 11/09/20 0442  Weight: 68.6 kg 68.9 kg 69.5 kg    Telemetry    PAF, AV paced rhythm - Personally Reviewed  ECG    NA - Personally Reviewed  Physical Exam   GEN: No acute distress.   Neck: No  JVD Cardiac: Irregular RR, no murmurs, rubs, or gallops.  Respiratory:     Decreased breath sounds at the bases.   GI: Soft, nontender, non-distended  MS:   Mild edema; No deformity. Neuro:  Nonfocal  Psych: Normal affect   Labs    Chemistry Recent Labs  Lab 11/07/20 0530 11/08/20 0155 11/09/20 0142  NA 140 140 139  K 3.9 4.1 4.2  CL 104 102 105  CO2 24 29 27   GLUCOSE 124* 109* 103*  BUN 42* 46* 44*  CREATININE 1.91* 2.33* 1.93*  CALCIUM 9.3 9.2 9.0  PROT  --  5.8*  --   ALBUMIN  --  2.8*  --   AST  --  31  --   ALT  --  30  --   ALKPHOS  --  79  --    BILITOT  --  1.8*  --   GFRNONAA 25* 20* 25*  ANIONGAP 12 9 7      Hematology Recent Labs  Lab 11/06/20 1326 11/08/20 0155 11/09/20 0142  WBC 4.2 4.6 3.8*  RBC 4.00 3.61* 3.63*  HGB 11.7* 10.1* 10.4*  HCT 38.4 35.4* 35.2*  MCV 96.0 98.1 97.0  MCH 29.3 28.0 28.7  MCHC 30.5 28.5* 29.5*  RDW 16.2* 16.3* 16.1*  PLT 146* 123* 127*    Cardiac EnzymesNo results for input(s): TROPONINI in the last 168 hours. No results for input(s): TROPIPOC in the last 168 hours.   BNP Recent Labs  Lab 11/06/20 1745  BNP 1,049.9*     DDimer No results for input(s): DDIMER in the last 168 hours.   Radiology    DG Chest 2 View  Result Date: 11/08/2020 CLINICAL DATA:  Dyspnea this morning EXAM: CHEST - 2 VIEW COMPARISON:  11/06/2020 FINDINGS: Frontal and  lateral views of the chest demonstrates stable multi lead pacer. Cardiac silhouette remains enlarged. Continued bilateral pleural effusions, left greater than right, with likely left basilar atelectasis. No pneumothorax. IMPRESSION: 1. Stable bilateral pleural effusions and left basilar atelectasis. Electronically Signed   By: Randa Ngo M.D.   On: 11/08/2020 18:48   ECHOCARDIOGRAM COMPLETE  Result Date: 11/07/2020    ECHOCARDIOGRAM REPORT   Patient Name:   [name] Date of Exam: 11/07/2020 Medical Rec #:  [id number]      Height:       64.0 in Accession #:    [id number]     Weight:       150.0 lb Date of Birth:  1930/01/25      BSA:          1.731 m Patient Age:    86 years       BP:           107/62 mmHg Patient Gender: F              HR:           60 bpm. Exam Location:  Inpatient Procedure: 2D Echo, Cardiac Doppler and Color Doppler Indications:    I50.40* Unspecified combined systolic (congestive) and diastolic                 (congestive) heart failure  History:        Patient has prior history of Echocardiogram examinations, most                 recent 07/31/2020. Abnormal ECG and Pacemaker, Mitral Valve                 Disease,  Arrythmias:Atrial Fibrillation, Signs/Symptoms:Dyspnea;                 Risk Factors:Hypertension. Edema. Mitral valve prolapse.                 Hypoxia.  Sonographer:    Roseanna Rainbow RDCS Referring Phys: [id number] JARED M GARDNER  Sonographer Comments: Technically difficult study due to poor echo windows. IMPRESSIONS  1. Left ventricular ejection fraction, by estimation, is 50 to 55%. The left ventricle has low normal function. The left ventricle has no regional wall motion abnormalities. There is mild left ventricular hypertrophy. Left ventricular diastolic parameters are indeterminate.  2. Right ventricular systolic function is moderately reduced. The right ventricular size is moderately enlarged. There is mildly elevated pulmonary artery systolic pressure. The estimated right ventricular systolic pressure is 65.7 mmHg.  3. Right atrial size was severely dilated.  4. The mitral valve is degenerative. Moderate mitral annular calcification. No evidence of mitral stenosis. Moderate mitral valve regurgitation. There is significant prolapse of the anterior leaflet with posterior directed MR jet that appears moderate but may be underestimating as eccentric jet and not well visualized on apical views. Could consider TEE or cardiac MRI for further evaluation  5. Tricuspid valve regurgitation is moderate.  6. The aortic valve is tricuspid. Aortic valve regurgitation is not visualized. Mild to moderate aortic valve sclerosis/calcification is present, without any evidence of aortic stenosis.  7. The inferior vena cava is dilated in size with <50% respiratory variability, suggesting right atrial pressure of 15 mmHg. FINDINGS  Left Ventricle: Left ventricular ejection fraction, by estimation, is 50 to 55%. The left ventricle has low normal function. The left ventricle has no regional wall motion abnormalities. The left ventricular internal cavity size was normal in size. There is  mild left ventricular hypertrophy. Left ventricular  diastolic parameters are indeterminate. Right Ventricle: The right ventricular size is moderately enlarged. Right vetricular wall thickness was not well visualized. Right ventricular systolic function is moderately reduced. There is mildly elevated pulmonary artery systolic pressure. The tricuspid regurgitant velocity is 2.66 m/s, and with an assumed right atrial pressure of 15 mmHg, the estimated right ventricular systolic pressure is 22.9 mmHg. Left Atrium: Left atrial size was normal in size. Right Atrium: Right atrial size was severely dilated. Pericardium: Trivial pericardial effusion is present. Mitral Valve: The mitral valve is degenerative in appearance. Moderate mitral annular calcification. Moderate mitral valve regurgitation. No evidence of mitral valve stenosis. MV peak gradient, 6.2 mmHg. The mean mitral valve gradient is 1.0 mmHg. Tricuspid Valve: The tricuspid valve is normal in structure. Tricuspid valve regurgitation is moderate. Aortic Valve: The aortic valve is tricuspid. Aortic valve regurgitation is not visualized. Mild to moderate aortic valve sclerosis/calcification is present, without any evidence of aortic stenosis. Aortic valve mean gradient measures 3.0 mmHg. Aortic valve peak gradient measures 5.6 mmHg. Aortic valve area, by VTI measures 2.84 cm. Pulmonic Valve: The pulmonic valve was normal in structure. Pulmonic valve regurgitation is mild to moderate. Aorta: The aortic root and ascending aorta are structurally normal, with no evidence of dilitation. Venous: The inferior vena cava is dilated in size with less than 50% respiratory variability, suggesting right atrial pressure of 15 mmHg. IAS/Shunts: The interatrial septum was not well visualized. Additional Comments: There is a small pleural effusion in the left lateral region.  LEFT VENTRICLE PLAX 2D LVIDd:         4.30 cm     Diastology LVIDs:         3.05 cm     LV e' medial:    3.55 cm/s LV PW:         1.00 cm     LV E/e' medial:   32.1 LV IVS:        1.10 cm     LV e' lateral:   6.19 cm/s LVOT diam:     2.00 cm     LV E/e' lateral: 18.4 LV SV:         71 LV SV Index:   41 LVOT Area:     3.14 cm  LV Volumes (MOD) LV vol d, MOD A2C: 79.8 ml LV vol d, MOD A4C: 57.8 ml LV vol s, MOD A2C: 34.6 ml LV vol s, MOD A4C: 26.5 ml LV SV MOD A2C:     45.2 ml LV SV MOD A4C:     57.8 ml LV SV MOD BP:      39.8 ml RIGHT VENTRICLE            IVC RV S prime:     6.58 cm/s  IVC diam: 2.50 cm TAPSE (M-mode): 0.7 cm LEFT ATRIUM             Index       RIGHT ATRIUM           Index LA diam:        3.75 cm 2.17 cm/m  RA Area:     28.90 cm LA Vol (A2C):   40.5 ml 23.39 ml/m RA Volume:   98.10 ml  56.67 ml/m LA Vol (A4C):   56.0 ml 32.35 ml/m LA Biplane Vol: 49.4 ml 28.53 ml/m  AORTIC VALVE  PULMONIC VALVE AV Area (Vmax):    3.05 cm    PR End Diast Vel: 2.32 msec AV Area (Vmean):   2.88 cm AV Area (VTI):     2.84 cm AV Vmax:           118.50 cm/s AV Vmean:          80.300 cm/s AV VTI:            0.248 m AV Peak Grad:      5.6 mmHg AV Mean Grad:      3.0 mmHg LVOT Vmax:         115.00 cm/s LVOT Vmean:        73.700 cm/s LVOT VTI:          0.225 m LVOT/AV VTI ratio: 0.91  AORTA Ao Root diam: 3.10 cm MITRAL VALVE                TRICUSPID VALVE MV Area (PHT): 3.77 cm     TR Peak grad:   28.3 mmHg MV Peak grad:  6.2 mmHg     TR Vmax:        266.00 cm/s MV Mean grad:  1.0 mmHg MV Vmax:       1.24 m/s     SHUNTS MV Vmean:      47.5 cm/s    Systemic VTI:  0.22 m MV Decel Time: 201 msec     Systemic Diam: 2.00 cm MR Peak grad: 66.3 mmHg MR Mean grad: 37.0 mmHg MR Vmax:      407.00 cm/s MR Vmean:     282.0 cm/s MV E velocity: 114.00 cm/s Oswaldo Milian MD Electronically signed by Oswaldo Milian MD Signature Date/Time: 11/07/2020/8:59:56 PM    Final     Cardiac Studies   ECHO:  1. Left ventricular ejection fraction, by estimation, is 50 to 55%. The  left ventricle has low normal function. The left ventricle has no regional  wall  motion abnormalities. There is mild left ventricular hypertrophy.  Left ventricular diastolic  parameters are indeterminate.  2. Right ventricular systolic function is moderately reduced. The right  ventricular size is moderately enlarged. There is mildly elevated  pulmonary artery systolic pressure. The estimated right ventricular  systolic pressure is 16.1 mmHg.  3. Right atrial size was severely dilated.  4. The mitral valve is degenerative. Moderate mitral annular  calcification. No evidence of mitral stenosis. Moderate mitral valve  regurgitation. There is significant prolapse of the anterior leaflet with  posterior directed MR jet that appears moderate  but may be underestimating as eccentric jet and not well visualized on  apical views. Could consider TEE or cardiac MRI for further evaluation  5. Tricuspid valve regurgitation is moderate.  6. The aortic valve is tricuspid. Aortic valve regurgitation is not  visualized. Mild to moderate aortic valve sclerosis/calcification is  present, without any evidence of aortic stenosis.  7. The inferior vena cava is dilated in size with <50% respiratory  variability, suggesting right atrial pressure of 15 mmHg.   Patient Profile     86 y.o. female with a hx of NICM, BiV ICD, atrial fib persistent, high grade AV block with PPM, on amiodarone, hyperthyroidism, anemia and lower ext edema, CKD 3-4  who is being seen today for the evaluation of abnormal echo on this admit for acute CHF at the request of Dr. Eliseo Squires.  Assessment & Plan    Acute on chronic diastolic HF:  Right sided systolic dysfunction as well.  CXR without significant change in effusion and I would not suggest thoracentesis.  She does did have an improvement in her creat.  I will resume Lasix IV today.  She has her feet up and compression stockings on. See device report below.    Atrial fib:  She has had 17 episodes of atrial fib.  We had her device interrogated yesterday.  She  has recent sustained decrease in impedance.  She also has had more atrial fib on her device and has had intermittent episodes repeatedly in on tele here.  I wonder if this is contributing to problems with volume.  I will let Dr. Caryl Comes know and I will increase the amiodarone to 200 mg and defer long term management to Dr. Caryl Comes.    For questions or updates, please contact [place] Please consult [email or web address] for contact info under Cardiology/STEMI.   Signed, Minus Breeding, MD  11/09/2020, 9:52 AM

## 2020-11-09 NOTE — Progress Notes (Signed)
Progress Note    [name]  ZOX:[id number] DOB: [birth year]  DOA: 11/06/2020 PCP: Chesley Noon, MD    Brief Narrative:    Medical records reviewed and are as summarized below:  [name] is an 86 y.o. female with medical history significant of A.Fib on amiodarone and Xarelto, HTN, SSS s/p PPM.  CHF, CKD. Pt with h/o dCHF previously, echo in April 2021 showed newly reduced EF 25-30%, cards suspected this possibly due to her being paced 100% of the time.  Looks like adjustments were made, repeat echo in Sept 2021 showed normal EF. Last month her PCP noted that creat had increased to 1.68, was concerned that she was dehydrated and so stopped her lasix. Since that time she has had worsening edema now progressed to anasarca, SOB, DOE, orthopnea. Seen in cards office and had labs drawn last week on 12/23.  Cards put her on lasix 80mg  daily for 3 days then back to 60mg  daily.   Assessment/Plan:   Principal Problem:   CKD (chronic kidney disease) stage 3, GFR 30-59 ml/min (HCC) Active Problems:   Hypertension   Sick sinus syndrome (HCC)   Paroxysmal atrial fibrillation (HCC)   Biventricular cardiac pacemaker in situ   Acute on chronic combined systolic and diastolic CHF (congestive heart failure) (HCC)   AKI (acute kidney injury) ([place])   Dyspnea   Tricuspid valve regurgitation    Acute on chronic CHF - -echo: There is significant prolapse of the anterior leaflet with  posterior directed MR jet that appears moderate  but may be underestimating as eccentric jet and not well visualized on  apical views. Could consider TEE or cardiac MRI for further evaluation -IV lasix on hold-- ? Low albumin and 3rd spacing/intravascularly dry -cards consult appreciated -daily weights/Strict intake and output -1270ml fluid restriction -compression hose (wears at home)  AKI on CKD 3b - -IV lasix per cards  SSS and biventricular PPM -  -follows with Dr. Caryl Comes  HTN - -Only on  coreg at this point -BP actually on low side  PAF - -Cont amiodarone but at a higher dose -Cont xarelto -per cards: device interrogated yesterday.  She has recent sustained decrease in impedance.  She also has had more atrial fib on her device and has had intermittent episodes repeatedly in on tele here.  I wonder if this is contributing to problems with volume.  I will let Dr. Caryl Comes know and I will increase the amiodarone to 200 mg and defer long term management to Dr. Caryl Comes.     Family Communication/Anticipated D/C date and plan/Code Status   DVT prophylaxis: xarelto Code Status: Full Code.  Disposition Plan: Status is: inpt Spoke with daughter 12/30 The patient will require care spanning > 2 midnights and should be moved to inpatient because: Inpatient level of care appropriate due to severity of illness  Dispo: The patient is from: Home              Anticipated d/c is to: Home              Anticipated d/c date is: 2 days              Patient currently is not medically stable to d/c.         Medical Consultants:    cards     Subjective:   Still SOB with exertion  Objective:    Vitals:   11/08/20 2235 11/09/20 0442 11/09/20 0825 11/09/20 1430  BP: 112/68 98/66 (!) 98/6 110/73  Pulse: 69 71 61 60  Resp:  18 18 18   Temp:  (!) 97.5 F (36.4 C) (!) 97.5 F (36.4 C) 98.6 F (37 C)  TempSrc:  Oral Oral Oral  SpO2: 93% 97% 93% 97%  Weight:  69.5 kg    Height:        Intake/Output Summary (Last 24 hours) at 11/09/2020 1524 Last data filed at 11/09/2020 1115 Gross per 24 hour  Intake 240 ml  Output 250 ml  Net -10 ml   Filed Weights   11/07/20 1022 11/08/20 0515 11/09/20 0442  Weight: 68.6 kg 68.9 kg 69.5 kg    Exam:   General: Appearance:     Overweight female in no acute distress     Lungs:     Diminished, mild increased work of breathing with talking  Heart:    Normal heart rate.   MS:   All extremities are intact.   Neurologic:   Awake,  alert, pleasant and cooperative    Data Reviewed:   I have personally reviewed following labs and imaging studies:  Labs: Labs show the following:   Basic Metabolic Panel: Recent Labs  Lab 11/06/20 1326 11/07/20 0530 11/08/20 0155 11/09/20 0142  NA 138 140 140 139  K 3.4* 3.9 4.1 4.2  CL 98 104 102 105  CO2 26 24 29 27   GLUCOSE 105* 124* 109* 103*  BUN 40* 42* 46* 44*  CREATININE 1.92* 1.91* 2.33* 1.93*  CALCIUM 9.6 9.3 9.2 9.0   GFR Estimated Creatinine Clearance: 18.4 mL/min (A) (by C-G formula based on SCr of 1.93 mg/dL (H)). Liver Function Tests: Recent Labs  Lab 11/08/20 0155  AST 31  ALT 30  ALKPHOS 79  BILITOT 1.8*  PROT 5.8*  ALBUMIN 2.8*   No results for input(s): LIPASE, AMYLASE in the last 168 hours. No results for input(s): AMMONIA in the last 168 hours. Coagulation profile No results for input(s): INR, PROTIME in the last 168 hours.  CBC: Recent Labs  Lab 11/06/20 1326 11/08/20 0155 11/09/20 0142  WBC 4.2 4.6 3.8*  HGB 11.7* 10.1* 10.4*  HCT 38.4 35.4* 35.2*  MCV 96.0 98.1 97.0  PLT 146* 123* 127*   Cardiac Enzymes: No results for input(s): CKTOTAL, CKMB, CKMBINDEX, TROPONINI in the last 168 hours. BNP (last 3 results) Recent Labs    03/02/20 0930  PROBNP 3,278*   CBG: No results for input(s): GLUCAP in the last 168 hours. D-Dimer: No results for input(s): DDIMER in the last 72 hours. Hgb A1c: No results for input(s): HGBA1C in the last 72 hours. Lipid Profile: No results for input(s): CHOL, HDL, LDLCALC, TRIG, CHOLHDL, LDLDIRECT in the last 72 hours. Thyroid function studies: No results for input(s): TSH, T4TOTAL, T3FREE, THYROIDAB in the last 72 hours.  Invalid input(s): FREET3 Anemia work up: No results for input(s): VITAMINB12, FOLATE, FERRITIN, TIBC, IRON, RETICCTPCT in the last 72 hours. Sepsis Labs: Recent Labs  Lab 11/06/20 1326 11/08/20 0155 11/09/20 0142  WBC 4.2 4.6 3.8*    Microbiology Recent Results  (from the past 240 hour(s))  SARS CORONAVIRUS 2 (TAT 6-24 HRS) Nasopharyngeal Nasopharyngeal Swab     Status: None   Collection Time: 11/06/20  8:27 PM   Specimen: Nasopharyngeal Swab  Result Value Ref Range Status   SARS Coronavirus 2 NEGATIVE NEGATIVE Final    Comment: (NOTE) SARS-CoV-2 target nucleic acids are NOT DETECTED.  The SARS-CoV-2 RNA is generally detectable in upper and lower respiratory  specimens during the acute phase of infection. Negative results do not preclude SARS-CoV-2 infection, do not rule out co-infections with other pathogens, and should not be used as the sole basis for treatment or other patient management decisions. Negative results must be combined with clinical observations, patient history, and epidemiological information. The expected result is Negative.  Fact Sheet for Patients: SugarRoll.be  Fact Sheet for Healthcare Providers: https://www.woods-mathews.com/  This test is not yet approved or cleared by the [place] FDA and  has been authorized for detection and/or diagnosis of SARS-CoV-2 by FDA under an Emergency Use Authorization (EUA). This EUA will remain  in effect (meaning this test can be used) for the duration of the COVID-19 declaration under Se ction 564(b)(1) of the Act, 21 U.S.C. section 360bbb-3(b)(1), unless the authorization is terminated or revoked sooner.  Performed at [facility], [place] [address], [place], [place] [address]     Procedures and diagnostic studies:  DG Chest 2 View  Result Date: 11/08/2020 CLINICAL DATA:  Dyspnea this morning EXAM: CHEST - 2 VIEW COMPARISON:  11/06/2020 FINDINGS: Frontal and lateral views of the chest demonstrates stable multi lead pacer. Cardiac silhouette remains enlarged. Continued bilateral pleural effusions, left greater than right, with likely left basilar atelectasis. No pneumothorax. IMPRESSION: 1. Stable bilateral pleural effusions and  left basilar atelectasis. Electronically Signed   By: Randa Ngo M.D.   On: 11/08/2020 18:48   ECHOCARDIOGRAM COMPLETE  Result Date: 11/07/2020    ECHOCARDIOGRAM REPORT   Patient Name:   BRYAH OCHELTREE Date of Exam: 11/07/2020 Medical Rec #:  [id number]      Height:       64.0 in Accession #:    [id number]     Weight:       150.0 lb Date of Birth:  30-Oct-1930      BSA:          1.731 m Patient Age:    79 years       BP:           107/62 mmHg Patient Gender: F              HR:           60 bpm. Exam Location:  Inpatient Procedure: 2D Echo, Cardiac Doppler and Color Doppler Indications:    I50.40* Unspecified combined systolic (congestive) and diastolic                 (congestive) heart failure  History:        Patient has prior history of Echocardiogram examinations, most                 recent 07/31/2020. Abnormal ECG and Pacemaker, Mitral Valve                 Disease, Arrythmias:Atrial Fibrillation, Signs/Symptoms:Dyspnea;                 Risk Factors:Hypertension. Edema. Mitral valve prolapse.                 Hypoxia.  Sonographer:    Roseanna Rainbow RDCS Referring Phys: [phone] JARED M GARDNER  Sonographer Comments: Technically difficult study due to poor echo windows. IMPRESSIONS  1. Left ventricular ejection fraction, by estimation, is 50 to 55%. The left ventricle has low normal function. The left ventricle has no regional wall motion abnormalities. There is mild left ventricular hypertrophy. Left ventricular diastolic parameters are indeterminate.  2. Right ventricular systolic function is moderately  reduced. The right ventricular size is moderately enlarged. There is mildly elevated pulmonary artery systolic pressure. The estimated right ventricular systolic pressure is 02.5 mmHg.  3. Right atrial size was severely dilated.  4. The mitral valve is degenerative. Moderate mitral annular calcification. No evidence of mitral stenosis. Moderate mitral valve regurgitation. There is significant prolapse of the  anterior leaflet with posterior directed MR jet that appears moderate but may be underestimating as eccentric jet and not well visualized on apical views. Could consider TEE or cardiac MRI for further evaluation  5. Tricuspid valve regurgitation is moderate.  6. The aortic valve is tricuspid. Aortic valve regurgitation is not visualized. Mild to moderate aortic valve sclerosis/calcification is present, without any evidence of aortic stenosis.  7. The inferior vena cava is dilated in size with <50% respiratory variability, suggesting right atrial pressure of 15 mmHg. FINDINGS  Left Ventricle: Left ventricular ejection fraction, by estimation, is 50 to 55%. The left ventricle has low normal function. The left ventricle has no regional wall motion abnormalities. The left ventricular internal cavity size was normal in size. There is mild left ventricular hypertrophy. Left ventricular diastolic parameters are indeterminate. Right Ventricle: The right ventricular size is moderately enlarged. Right vetricular wall thickness was not well visualized. Right ventricular systolic function is moderately reduced. There is mildly elevated pulmonary artery systolic pressure. The tricuspid regurgitant velocity is 2.66 m/s, and with an assumed right atrial pressure of 15 mmHg, the estimated right ventricular systolic pressure is 42.7 mmHg. Left Atrium: Left atrial size was normal in size. Right Atrium: Right atrial size was severely dilated. Pericardium: Trivial pericardial effusion is present. Mitral Valve: The mitral valve is degenerative in appearance. Moderate mitral annular calcification. Moderate mitral valve regurgitation. No evidence of mitral valve stenosis. MV peak gradient, 6.2 mmHg. The mean mitral valve gradient is 1.0 mmHg. Tricuspid Valve: The tricuspid valve is normal in structure. Tricuspid valve regurgitation is moderate. Aortic Valve: The aortic valve is tricuspid. Aortic valve regurgitation is not visualized. Mild  to moderate aortic valve sclerosis/calcification is present, without any evidence of aortic stenosis. Aortic valve mean gradient measures 3.0 mmHg. Aortic valve peak gradient measures 5.6 mmHg. Aortic valve area, by VTI measures 2.84 cm. Pulmonic Valve: The pulmonic valve was normal in structure. Pulmonic valve regurgitation is mild to moderate. Aorta: The aortic root and ascending aorta are structurally normal, with no evidence of dilitation. Venous: The inferior vena cava is dilated in size with less than 50% respiratory variability, suggesting right atrial pressure of 15 mmHg. IAS/Shunts: The interatrial septum was not well visualized. Additional Comments: There is a small pleural effusion in the left lateral region.  LEFT VENTRICLE PLAX 2D LVIDd:         4.30 cm     Diastology LVIDs:         3.05 cm     LV e' medial:    3.55 cm/s LV PW:         1.00 cm     LV E/e' medial:  32.1 LV IVS:        1.10 cm     LV e' lateral:   6.19 cm/s LVOT diam:     2.00 cm     LV E/e' lateral: 18.4 LV SV:         71 LV SV Index:   41 LVOT Area:     3.14 cm  LV Volumes (MOD) LV vol d, MOD A2C: 79.8 ml LV vol d, MOD A4C: 57.8  ml LV vol s, MOD A2C: 34.6 ml LV vol s, MOD A4C: 26.5 ml LV SV MOD A2C:     45.2 ml LV SV MOD A4C:     57.8 ml LV SV MOD BP:      39.8 ml RIGHT VENTRICLE            IVC RV S prime:     6.58 cm/s  IVC diam: 2.50 cm TAPSE (M-mode): 0.7 cm LEFT ATRIUM             Index       RIGHT ATRIUM           Index LA diam:        3.75 cm 2.17 cm/m  RA Area:     28.90 cm LA Vol (A2C):   40.5 ml 23.39 ml/m RA Volume:   98.10 ml  56.67 ml/m LA Vol (A4C):   56.0 ml 32.35 ml/m LA Biplane Vol: 49.4 ml 28.53 ml/m  AORTIC VALVE                   PULMONIC VALVE AV Area (Vmax):    3.05 cm    PR End Diast Vel: 2.32 msec AV Area (Vmean):   2.88 cm AV Area (VTI):     2.84 cm AV Vmax:           118.50 cm/s AV Vmean:          80.300 cm/s AV VTI:            0.248 m AV Peak Grad:      5.6 mmHg AV Mean Grad:      3.0 mmHg LVOT Vmax:          115.00 cm/s LVOT Vmean:        73.700 cm/s LVOT VTI:          0.225 m LVOT/AV VTI ratio: 0.91  AORTA Ao Root diam: 3.10 cm MITRAL VALVE                TRICUSPID VALVE MV Area (PHT): 3.77 cm     TR Peak grad:   28.3 mmHg MV Peak grad:  6.2 mmHg     TR Vmax:        266.00 cm/s MV Mean grad:  1.0 mmHg MV Vmax:       1.24 m/s     SHUNTS MV Vmean:      47.5 cm/s    Systemic VTI:  0.22 m MV Decel Time: 201 msec     Systemic Diam: 2.00 cm MR Peak grad: 66.3 mmHg MR Mean grad: 37.0 mmHg MR Vmax:      407.00 cm/s MR Vmean:     282.0 cm/s MV E velocity: 114.00 cm/s Oswaldo Milian MD Electronically signed by Oswaldo Milian MD Signature Date/Time: 11/07/2020/8:59:56 PM    Final     Medications:   . ALPRAZolam  0.5 mg Oral QHS  . [START ON 11/10/2020] amiodarone  200 mg Oral Daily  . busPIRone  5 mg Oral BID  . carvedilol  6.25 mg Oral BID  . furosemide  40 mg Intravenous BID  . potassium chloride  20 mEq Oral BID  . Rivaroxaban  15 mg Oral Q supper  . sodium chloride flush  3 mL Intravenous Q12H   Continuous Infusions: . sodium chloride       LOS: 1 day   Geradine Girt  [facility]   How to contact the [facility] Attending or  Consulting provider [place] or covering provider during after hours [place], for this patient?  1. Check the care team in [facility] and look for a) attending/consulting [facility] provider listed and b) the [facility] team listed 2. Log into [email or web address] and use [place]'s universal password to access. If you do not have the password, please contact the hospital operator. 3. Locate the [facility] provider you are looking for under [facility] and page to a number that you can be directly reached. 4. If you still have difficulty reaching the provider, please page the [facility] (Director on Call) for the Hospitalists listed on amion for assistance.  11/09/2020, 3:24 PM

## 2020-11-10 DIAGNOSIS — I5033 Acute on chronic diastolic (congestive) heart failure: Secondary | ICD-10-CM

## 2020-11-10 DIAGNOSIS — N1832 Chronic kidney disease, stage 3b: Secondary | ICD-10-CM | POA: Diagnosis not present

## 2020-11-10 LAB — CBC
HCT: 35.9 % — ABNORMAL LOW (ref 36.0–46.0)
Hemoglobin: 10.5 g/dL — ABNORMAL LOW (ref 12.0–15.0)
MCH: 28.2 pg (ref 26.0–34.0)
MCHC: 29.2 g/dL — ABNORMAL LOW (ref 30.0–36.0)
MCV: 96.5 fL (ref 80.0–100.0)
Platelets: 120 10*3/uL — ABNORMAL LOW (ref 150–400)
RBC: 3.72 MIL/uL — ABNORMAL LOW (ref 3.87–5.11)
RDW: 16 % — ABNORMAL HIGH (ref 11.5–15.5)
WBC: 3.8 10*3/uL — ABNORMAL LOW (ref 4.0–10.5)
nRBC: 0 % (ref 0.0–0.2)

## 2020-11-10 LAB — BASIC METABOLIC PANEL
Anion gap: 10 (ref 5–15)
BUN: 43 mg/dL — ABNORMAL HIGH (ref 8–23)
CO2: 27 mmol/L (ref 22–32)
Calcium: 9.3 mg/dL (ref 8.9–10.3)
Chloride: 105 mmol/L (ref 98–111)
Creatinine, Ser: 1.87 mg/dL — ABNORMAL HIGH (ref 0.44–1.00)
GFR, Estimated: 26 mL/min — ABNORMAL LOW (ref >60)
Glucose, Bld: 101 mg/dL — ABNORMAL HIGH (ref 70–99)
Potassium: 4.1 mmol/L (ref 3.5–5.1)
Sodium: 142 mmol/L (ref 135–145)

## 2020-11-10 NOTE — Progress Notes (Signed)
Progress Note  [name] Date of Encounter: 11/10/2020  Primary Cardiologist: Virl Axe, MD  Subjective   Feeling better overall, legs are less tight and her abdomen feels less bloated.  Able to lie flat in bed.  Inpatient Medications    Scheduled Meds: . ALPRAZolam  0.5 mg Oral QHS  . amiodarone  200 mg Oral Daily  . busPIRone  5 mg Oral BID  . carvedilol  6.25 mg Oral BID  . furosemide  40 mg Intravenous BID  . potassium chloride  20 mEq Oral BID  . Rivaroxaban  15 mg Oral Q supper  . sodium chloride flush  3 mL Intravenous Q12H   Continuous Infusions: . sodium chloride     PRN Meds: sodium chloride, acetaminophen, ondansetron (ZOFRAN) IV, sodium chloride flush   Vital Signs    Vitals:   11/09/20 1430 11/09/20 1706 11/09/20 2028 11/10/20 0536  BP: 110/73 112/83 109/79 105/80  Pulse: 60  (!) 101   Resp: 18  15 18   Temp: 98.6 F (37 C)  98.6 F (37 C) (!) 97.3 F (36.3 C)  TempSrc: Oral  Oral Oral  SpO2: 97%  98% 96%  Weight:      Height:        Intake/Output Summary (Last 24 hours) at 11/10/2020 0902 Last data filed at 11/10/2020 0100 Gross per 24 hour  Intake --  Output 1550 ml  Net -1550 ml   Filed Weights   11/07/20 1022 11/08/20 0515 11/09/20 0442  Weight: 68.6 kg 68.9 kg 69.5 kg    Telemetry    Dual-chamber paced rhythm.  Personally reviewed.  ECG    No ECG reviewed.  Physical Exam   GEN:  Elderly woman.  No acute distress.   Neck: No JVD. Cardiac: RRR, 2/6 systolic murmur, no gallop.  Respiratory: Nonlabored.  Scattered rhonchi. GI: Soft, nontender, bowel sounds present. MS:  Compression hose in place bilaterally. Neuro:  Nonfocal. Psych: Alert and oriented x 3. Normal affect.  Labs    Chemistry Recent Labs  Lab 11/08/20 0155 11/09/20 0142 11/10/20 0355  NA 140 139 142  K 4.1 4.2 4.1  CL 102 105 105  CO2 29 27 27   GLUCOSE 109* 103* 101*  BUN 46* 44* 43*  CREATININE 2.33* 1.93* 1.87*  CALCIUM 9.2 9.0  9.3  PROT 5.8*  --   --   ALBUMIN 2.8*  --   --   AST 31  --   --   ALT 30  --   --   ALKPHOS 79  --   --   BILITOT 1.8*  --   --   GFRNONAA 20* 25* 26*  ANIONGAP 9 7 10      Hematology Recent Labs  Lab 11/08/20 0155 11/09/20 0142 11/10/20 0355  WBC 4.6 3.8* 3.8*  RBC 3.61* 3.63* 3.72*  HGB 10.1* 10.4* 10.5*  HCT 35.4* 35.2* 35.9*  MCV 98.1 97.0 96.5  MCH 28.0 28.7 28.2  MCHC 28.5* 29.5* 29.2*  RDW 16.3* 16.1* 16.0*  PLT 123* 127* 120*    Cardiac EnzymesNo results for input(s): TROPONINIHS in the last 720 hours.  BNP Recent Labs  Lab 11/06/20 1745  BNP 1,049.9*     Radiology    DG Chest 2 View  Result Date: 11/08/2020 CLINICAL DATA:  Dyspnea this morning EXAM: CHEST - 2 VIEW COMPARISON:  11/06/2020 FINDINGS: Frontal and lateral views of the chest demonstrates stable multi lead pacer. Cardiac silhouette remains enlarged. Continued bilateral  pleural effusions, left greater than right, with likely left basilar atelectasis. No pneumothorax. IMPRESSION: 1. Stable bilateral pleural effusions and left basilar atelectasis. Electronically Signed   By: Randa Ngo M.D.   On: 11/08/2020 18:48    Cardiac Studies   Echocardiogram 11/07/2020: 1. Left ventricular ejection fraction, by estimation, is 50 to 55%. The  left ventricle has low normal function. The left ventricle has no regional  wall motion abnormalities. There is mild left ventricular hypertrophy.  Left ventricular diastolic  parameters are indeterminate.  2. Right ventricular systolic function is moderately reduced. The right  ventricular size is moderately enlarged. There is mildly elevated  pulmonary artery systolic pressure. The estimated right ventricular  systolic pressure is 98.3 mmHg.  3. Right atrial size was severely dilated.  4. The mitral valve is degenerative. Moderate mitral annular  calcification. No evidence of mitral stenosis. Moderate mitral valve  regurgitation. There is significant  prolapse of the anterior leaflet with  posterior directed MR jet that appears moderate  but may be underestimating as eccentric jet and not well visualized on  apical views. Could consider TEE or cardiac MRI for further evaluation  5. Tricuspid valve regurgitation is moderate.  6. The aortic valve is tricuspid. Aortic valve regurgitation is not  visualized. Mild to moderate aortic valve sclerosis/calcification is  present, without any evidence of aortic stenosis.  7. The inferior vena cava is dilated in size with <50% respiratory  variability, suggesting right atrial pressure of 15 mmHg.   Patient Profile     87 y.o. 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  Assessment & Plan    1.  Acute on chronic diastolic heart failure with associated right ventricular dysfunction and moderate pulmonary hypertension.  Symptomatically improving with IV diuresis.  Currently on Lasix 40 mg IV twice daily, approximately 1300 cc out more than in last 24 hours.  LVEF 50 to 55% range at this point.  2.  Paroxysmal to persistent atrial fibrillation per device interrogation.  Could potentially be contributing to problem #1.  Amiodarone dose was uptitrated.  She is on Xarelto for stroke prophylaxis.  CHA2DS2-VASc score is 5.  3.  Anterior mitral leaflet prolapse with at least moderate eccentric mitral regurgitation.  4.  Acute on chronic renal insufficiency, CKD stage IIIb at baseline.  Creatinine improving, down to 1.87.  Continue current dose of IV Lasix with potassium supplement.  Continue Coreg and amiodarone.  Would not pursue further imaging studies for work-up of mitral regurgitation at this time, focus on volume control and medical therapy optimization.  She is  improving symptomatically.  Signed, Rozann Lesches, MD  11/10/2020, 9:02 AM

## 2020-11-10 NOTE — Progress Notes (Signed)
Progress Note    [name]  [id number] DOB: 05/18/1930  DOA: 11/06/2020 PCP: Chesley Noon, MD    Brief Narrative:    Medical records reviewed and are as summarized below:  [name] is an 87 y.o. female with medical history significant of A.Fib on amiodarone and Xarelto, HTN, SSS s/p PPM.  CHF, CKD. Pt with h/o dCHF previously, echo in April 2021 showed newly reduced EF 25-30%, cards suspected this possibly due to her being paced 100% of the time.  Looks like adjustments were made, repeat echo in Sept 2021 showed normal EF. Last month her PCP noted that creat had increased to 1.68, was concerned that she was dehydrated and so stopped her lasix. Since that time she has had worsening edema now progressed to anasarca, SOB, DOE, orthopnea. Seen in cards office and had labs drawn last week on 12/23.  Cards put her on lasix 80mg  daily for 3 days then back to 60mg  daily.   Assessment/Plan:   Principal Problem:   CKD (chronic kidney disease) stage 3, GFR 30-59 ml/min (HCC) Active Problems:   Hypertension   Sick sinus syndrome (HCC)   Paroxysmal atrial fibrillation (HCC)   Biventricular cardiac pacemaker in situ   Acute on chronic combined systolic and diastolic CHF (congestive heart failure) (HCC)   AKI (acute kidney injury) ([place])   Dyspnea   Tricuspid valve regurgitation    Acute on chronic CHF - -echo: There is significant prolapse of the anterior leaflet with  posterior directed MR jet that appears moderate  but may be underestimating as eccentric jet and not well visualized on  apical views. Could consider TEE or cardiac MRI for further evaluation -IV lasix per cards -cards consult appreciated -daily weights/Strict intake and output -1246ml fluid restriction -compression hose (wears at home)  AKI on CKD 3b - -IV lasix per cards -Cr stable  SSS and biventricular PPM -  -follows with Dr. Caryl Comes  HTN - -Only on coreg at this point -BP actually on  low side  PAF - -Cont amiodarone but at a higher dose -Cont xarelto -per cards: device interrogated yesterday.  She has recent sustained decrease in impedance.  She also has had more atrial fib on her device and has had intermittent episodes repeatedly in on tele here.  I wonder if this is contributing to problems with volume.  I will let Dr. Caryl Comes know and I will increase the amiodarone to 200 mg and defer long term management to Dr. Caryl Comes.     Family Communication/Anticipated D/C date and plan/Code Status   DVT prophylaxis: xarelto Code Status: Full Code.  Disposition Plan: Status is: inpt LM for daughter The patient will require care spanning > 2 midnights and should be moved to inpatient because: Inpatient level of care appropriate due to severity of illness  Dispo: The patient is from: Home              Anticipated d/c is to: Home              Anticipated d/c date is: 2 days              Patient currently is not medically stable to d/c.         Medical Consultants:    cards     Subjective:   No current complaints- thinks her swelling is better  Objective:    Vitals:   11/09/20 1430 11/09/20 1706 11/09/20 2028 11/10/20 0536  BP: 110/73  112/83 109/79 105/80  Pulse: 60  (!) 101   Resp: 18  15 18   Temp: 98.6 F (37 C)  98.6 F (37 C) (!) 97.3 F (36.3 C)  TempSrc: Oral  Oral Oral  SpO2: 97%  98% 96%  Weight:      Height:        Intake/Output Summary (Last 24 hours) at 11/10/2020 1316 Last data filed at 11/10/2020 0100 Gross per 24 hour  Intake -  Output 1300 ml  Net -1300 ml   Filed Weights   11/07/20 1022 11/08/20 0515 11/09/20 0442  Weight: 68.6 kg 68.9 kg 69.5 kg    Exam:  General: Appearance:     Overweight female in no acute distress     Lungs:     respirations unlabored  Heart:    Tachycardic. irr  MS:   All extremities are intact.   Neurologic:   Awake, alert, oriented x 3. No apparent focal neurological           defect.      Data  Reviewed:   I have personally reviewed following labs and imaging studies:  Labs: Labs show the following:   Basic Metabolic Panel: Recent Labs  Lab 11/06/20 1326 11/07/20 0530 11/08/20 0155 11/09/20 0142 11/10/20 0355  NA 138 140 140 139 142  K 3.4* 3.9 4.1 4.2 4.1  CL 98 104 102 105 105  CO2 26 24 29 27 27   GLUCOSE 105* 124* 109* 103* 101*  BUN 40* 42* 46* 44* 43*  CREATININE 1.92* 1.91* 2.33* 1.93* 1.87*  CALCIUM 9.6 9.3 9.2 9.0 9.3   GFR Estimated Creatinine Clearance: 19 mL/min (A) (by C-G formula based on SCr of 1.87 mg/dL (H)). Liver Function Tests: Recent Labs  Lab 11/08/20 0155  AST 31  ALT 30  ALKPHOS 79  BILITOT 1.8*  PROT 5.8*  ALBUMIN 2.8*   No results for input(s): LIPASE, AMYLASE in the last 168 hours. No results for input(s): AMMONIA in the last 168 hours. Coagulation profile No results for input(s): INR, PROTIME in the last 168 hours.  CBC: Recent Labs  Lab 11/06/20 1326 11/08/20 0155 11/09/20 0142 11/10/20 0355  WBC 4.2 4.6 3.8* 3.8*  HGB 11.7* 10.1* 10.4* 10.5*  HCT 38.4 35.4* 35.2* 35.9*  MCV 96.0 98.1 97.0 96.5  PLT 146* 123* 127* 120*   Cardiac Enzymes: No results for input(s): CKTOTAL, CKMB, CKMBINDEX, TROPONINI in the last 168 hours. BNP (last 3 results) Recent Labs    03/02/20 0930  PROBNP 3,278*   CBG: No results for input(s): GLUCAP in the last 168 hours. D-Dimer: No results for input(s): DDIMER in the last 72 hours. Hgb A1c: No results for input(s): HGBA1C in the last 72 hours. Lipid Profile: No results for input(s): CHOL, HDL, LDLCALC, TRIG, CHOLHDL, LDLDIRECT in the last 72 hours. Thyroid function studies: No results for input(s): TSH, T4TOTAL, T3FREE, THYROIDAB in the last 72 hours.  Invalid input(s): FREET3 Anemia work up: No results for input(s): VITAMINB12, FOLATE, FERRITIN, TIBC, IRON, RETICCTPCT in the last 72 hours. Sepsis Labs: Recent Labs  Lab 11/06/20 1326 11/08/20 0155 11/09/20 0142  11/10/20 0355  WBC 4.2 4.6 3.8* 3.8*    Microbiology Recent Results (from the past 240 hour(s))  SARS CORONAVIRUS 2 (TAT 6-24 HRS) Nasopharyngeal Nasopharyngeal Swab     Status: None   Collection Time: 11/06/20  8:27 PM   Specimen: Nasopharyngeal Swab  Result Value Ref Range Status   SARS Coronavirus 2 NEGATIVE NEGATIVE Final  Comment: (NOTE) SARS-CoV-2 target nucleic acids are NOT DETECTED.  The SARS-CoV-2 RNA is generally detectable in upper and lower respiratory specimens during the acute phase of infection. Negative results do not preclude SARS-CoV-2 infection, do not rule out co-infections with other pathogens, and should not be used as the sole basis for treatment or other patient management decisions. Negative results must be combined with clinical observations, patient history, and epidemiological information. The expected result is Negative.  Fact Sheet for Patients: SugarRoll.be  Fact Sheet for Healthcare Providers: https://www.woods-mathews.com/  This test is not yet approved or cleared by the [place] FDA and  has been authorized for detection and/or diagnosis of SARS-CoV-2 by FDA under an Emergency Use Authorization (EUA). This EUA will remain  in effect (meaning this test can be used) for the duration of the COVID-19 declaration under Se ction 564(b)(1) of the Act, 21 U.S.C. section 360bbb-3(b)(1), unless the authorization is terminated or revoked sooner.  Performed at [facility], [place] [address], [place], [place] [address]     Procedures and diagnostic studies:  DG Chest 2 View  Result Date: 11/08/2020 CLINICAL DATA:  Dyspnea this morning EXAM: CHEST - 2 VIEW COMPARISON:  11/06/2020 FINDINGS: Frontal and lateral views of the chest demonstrates stable multi lead pacer. Cardiac silhouette remains enlarged. Continued bilateral pleural effusions, left greater than right, with likely left basilar  atelectasis. No pneumothorax. IMPRESSION: 1. Stable bilateral pleural effusions and left basilar atelectasis. Electronically Signed   By: Randa Ngo M.D.   On: 11/08/2020 18:48    Medications:   . ALPRAZolam  0.5 mg Oral QHS  . amiodarone  200 mg Oral Daily  . busPIRone  5 mg Oral BID  . carvedilol  6.25 mg Oral BID  . furosemide  40 mg Intravenous BID  . potassium chloride  20 mEq Oral BID  . Rivaroxaban  15 mg Oral Q supper  . sodium chloride flush  3 mL Intravenous Q12H   Continuous Infusions: . sodium chloride       LOS: 2 days   Geradine Girt  [facility]   How to contact the [facility] Attending or Consulting provider [place] or covering provider during after hours [place], for this patient?  1. Check the care team in [facility] and look for a) attending/consulting TRH provider listed and b) the [facility] team listed 2. Log into [email or web address] and use [place]'s universal password to access. If you do not have the password, please contact the hospital operator. 3. Locate the [facility] provider you are looking for under [facility] and page to a number that you can be directly reached. 4. If you still have difficulty reaching the provider, please page the [facility] (Director on Call) for the Hospitalists listed on amion for assistance.  11/10/2020, 1:16 PM

## 2020-11-11 DIAGNOSIS — N1832 Chronic kidney disease, stage 3b: Secondary | ICD-10-CM | POA: Diagnosis not present

## 2020-11-11 DIAGNOSIS — I480 Paroxysmal atrial fibrillation: Secondary | ICD-10-CM

## 2020-11-11 DIAGNOSIS — I5033 Acute on chronic diastolic (congestive) heart failure: Secondary | ICD-10-CM | POA: Diagnosis not present

## 2020-11-11 DIAGNOSIS — N179 Acute kidney failure, unspecified: Secondary | ICD-10-CM | POA: Diagnosis not present

## 2020-11-11 DIAGNOSIS — R0600 Dyspnea, unspecified: Secondary | ICD-10-CM | POA: Diagnosis not present

## 2020-11-11 LAB — CBC
HCT: 37.3 % (ref 36.0–46.0)
Hemoglobin: 10.8 g/dL — ABNORMAL LOW (ref 12.0–15.0)
MCH: 28.2 pg (ref 26.0–34.0)
MCHC: 29 g/dL — ABNORMAL LOW (ref 30.0–36.0)
MCV: 97.4 fL (ref 80.0–100.0)
Platelets: 126 10*3/uL — ABNORMAL LOW (ref 150–400)
RBC: 3.83 MIL/uL — ABNORMAL LOW (ref 3.87–5.11)
RDW: 16.5 % — ABNORMAL HIGH (ref 11.5–15.5)
WBC: 4.2 10*3/uL (ref 4.0–10.5)
nRBC: 0 % (ref 0.0–0.2)

## 2020-11-11 LAB — BASIC METABOLIC PANEL
Anion gap: 11 (ref 5–15)
BUN: 44 mg/dL — ABNORMAL HIGH (ref 8–23)
CO2: 28 mmol/L (ref 22–32)
Calcium: 9.4 mg/dL (ref 8.9–10.3)
Chloride: 104 mmol/L (ref 98–111)
Creatinine, Ser: 2.15 mg/dL — ABNORMAL HIGH (ref 0.44–1.00)
GFR, Estimated: 22 mL/min — ABNORMAL LOW (ref >60)
Glucose, Bld: 102 mg/dL — ABNORMAL HIGH (ref 70–99)
Potassium: 4.3 mmol/L (ref 3.5–5.1)
Sodium: 143 mmol/L (ref 135–145)

## 2020-11-11 MED ORDER — FUROSEMIDE 10 MG/ML IJ SOLN
INTRAMUSCULAR | Status: AC
  Filled 2020-11-11: qty 4

## 2020-11-11 MED ORDER — AMIODARONE HCL 200 MG PO TABS
200.0000 mg | ORAL_TABLET | Freq: Two times a day (BID) | ORAL | Status: DC
  Filled 2020-11-11: qty 1

## 2020-11-11 MED ORDER — FUROSEMIDE 40 MG PO TABS
60.0000 mg | ORAL_TABLET | Freq: Every day | ORAL | Status: DC
  Administered 2020-11-12 – 2020-11-13 (×2): 60 mg via ORAL
  Filled 2020-11-11 (×2): qty 1

## 2020-11-11 MED ORDER — SODIUM CHLORIDE 0.9 % IV SOLN
1.0000 g | INTRAVENOUS | Status: AC
  Administered 2020-11-11 – 2020-11-13 (×3): 1 g via INTRAVENOUS
  Filled 2020-11-11 (×3): qty 10

## 2020-11-11 MED ORDER — AMIODARONE HCL 200 MG PO TABS
200.0000 mg | ORAL_TABLET | Freq: Two times a day (BID) | ORAL | Status: DC
  Administered 2020-11-11 – 2020-11-21 (×21): 200 mg via ORAL
  Filled 2020-11-11 (×20): qty 1

## 2020-11-11 NOTE — Progress Notes (Signed)
Progress Note  [name] Date of Encounter: 11/11/2020  Primary Cardiologist: Virl Axe, MD  Subjective   Shortness of breath better than at presentation.  Did have some hypoxia on ambulation.  Leg swelling improved.  Inpatient Medications    Scheduled Meds: . ALPRAZolam  0.5 mg Oral QHS  . amiodarone  200 mg Oral Daily  . busPIRone  5 mg Oral BID  . carvedilol  6.25 mg Oral BID  . furosemide  40 mg Intravenous BID  . potassium chloride  20 mEq Oral BID  . Rivaroxaban  15 mg Oral Q supper  . sodium chloride flush  3 mL Intravenous Q12H   Continuous Infusions: . sodium chloride     PRN Meds: sodium chloride, acetaminophen, ondansetron (ZOFRAN) IV, sodium chloride flush   Vital Signs    Vitals:   11/10/20 0536 11/10/20 1502 11/10/20 2041 11/11/20 0645  BP: 105/80  102/82 96/62  Pulse:  61 (!) 108 88  Resp: 18 20 16 16   Temp: (!) 97.3 F (36.3 C) 98.3 F (36.8 C) 97.8 F (36.6 C) 97.6 F (36.4 C)  TempSrc: Oral Oral Oral Oral  SpO2: 96%  97% 100%  Weight:    67.4 kg  Height:        Intake/Output Summary (Last 24 hours) at 11/11/2020 1050 Last data filed at 11/10/2020 2035 Gross per 24 hour  Intake 480 ml  Output 1200 ml  Net -720 ml   Filed Weights   11/08/20 0515 11/09/20 0442 11/11/20 0645  Weight: 68.9 kg 69.5 kg 67.4 kg    Telemetry    Dual-chamber paced rhythm.  Episodes consistent with atrial fibrillation also noted.  Personally reviewed.  ECG    No ECG reviewed.  Physical Exam   GEN:  Elderly woman.  No acute distress.   Neck: No JVD. Cardiac: RRR, 2/6 systolic murmur, no gallop.  Respiratory: Nonlabored.  No wheezing or crackles. GI: Soft, nontender, bowel sounds present. MS:  Edema improved, compression hose in place. Neuro:  Nonfocal. Psych: Alert and oriented x 3. Normal affect.  Labs    Chemistry Recent Labs  Lab 11/08/20 0155 11/09/20 0142 11/10/20 0355 11/11/20 0233  NA 140 139 142 143  K 4.1 4.2  4.1 4.3  CL 102 105 105 104  CO2 29 27 27 28   GLUCOSE 109* 103* 101* 102*  BUN 46* 44* 43* 44*  CREATININE 2.33* 1.93* 1.87* 2.15*  CALCIUM 9.2 9.0 9.3 9.4  PROT 5.8*  --   --   --   ALBUMIN 2.8*  --   --   --   AST 31  --   --   --   ALT 30  --   --   --   ALKPHOS 79  --   --   --   BILITOT 1.8*  --   --   --   GFRNONAA 20* 25* 26* 22*  ANIONGAP 9 7 10 11      Hematology Recent Labs  Lab 11/09/20 0142 11/10/20 0355 11/11/20 0233  WBC 3.8* 3.8* 4.2  RBC 3.63* 3.72* 3.83*  HGB 10.4* 10.5* 10.8*  HCT 35.2* 35.9* 37.3  MCV 97.0 96.5 97.4  MCH 28.7 28.2 28.2  MCHC 29.5* 29.2* 29.0*  RDW 16.1* 16.0* 16.5*  PLT 127* 120* 126*    BNP Recent Labs  Lab 11/06/20 1745  BNP 1,049.9*     Radiology    No results found.  Cardiac Studies   Echocardiogram  11/07/2020: 1. Left ventricular ejection fraction, by estimation, is 50 to 55%. The  left ventricle has low normal function. The left ventricle has no regional  wall motion abnormalities. There is mild left ventricular hypertrophy.  Left ventricular diastolic  parameters are indeterminate.  2. Right ventricular systolic function is moderately reduced. The right  ventricular size is moderately enlarged. There is mildly elevated  pulmonary artery systolic pressure. The estimated right ventricular  systolic pressure is 62.2 mmHg.  3. Right atrial size was severely dilated.  4. The mitral valve is degenerative. Moderate mitral annular  calcification. No evidence of mitral stenosis. Moderate mitral valve  regurgitation. There is significant prolapse of the anterior leaflet with  posterior directed MR jet that appears moderate  but may be underestimating as eccentric jet and not well visualized on  apical views. Could consider TEE or cardiac MRI for further evaluation  5. Tricuspid valve regurgitation is moderate.  6. The aortic valve is tricuspid. Aortic valve regurgitation is not  visualized. Mild to moderate aortic  valve sclerosis/calcification is  present, without any evidence of aortic stenosis.  7. The inferior vena cava is dilated in size with <50% respiratory  variability, suggesting right atrial pressure of 15 mmHg.   Patient Profile     87 y.o. 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  Assessment & Plan    1.  Acute on chronic diastolic heart failure with associated right ventricular dysfunction and moderate pulmonary hypertension.  Symptomatically improving with IV diuresis.  Decompensation also likely associated with increasing atrial fibrillation burden and also mitral regurgitation.  Currently on Lasix 40 mg IV twice daily, additional 500 cc out more than in last 24 hours.  LVEF 50 to 55% range at this point.  2.  Paroxysmal to persistent atrial fibrillation per device interrogation.  Could potentially be contributing to problem #1.  Amiodarone dose was uptitrated.  She is on Xarelto for stroke prophylaxis.  CHA2DS2-VASc score is 5.  3.  Anterior mitral leaflet prolapse with at least moderate eccentric mitral regurgitation.  4.  Acute on chronic renal insufficiency, CKD stage IIIb at baseline.  Creatinine has bumped up to 2.15.  Discontinue IV Lasix today after morning dose, switch back to 60 mg oral in the morning.  Continue Coreg, increase amiodarone to 200 mg twice daily for temporary load.  No further imaging studies for mitral regurgitation as an inpatient.  She would not be a candidate for open mitral valve repair even if severe MR. Not certain whether she would be reasonable consideration for MitraClip - this could be just discussed with the valve team as an outpatient.  Signed, Rozann Lesches, MD  11/11/2020, 10:50 AM

## 2020-11-11 NOTE — Progress Notes (Signed)
Progress Note    [name]  WUJ:[id number] DOB: 05/01/1930  DOA: 11/06/2020 PCP: Chesley Noon, MD    Brief Narrative:    Medical records reviewed and are as summarized below:  [name] is an 87 y.o. female with medical history significant of A.Fib on amiodarone and Xarelto, HTN, SSS s/p PPM.  CHF, CKD. Pt with h/o dCHF previously, echo in April 2021 showed newly reduced EF 25-30%, cards suspected this possibly due to her being paced 100% of the time.  Looks like adjustments were made, repeat echo in Sept 2021 showed normal EF. Last month her PCP noted that creat had increased to 1.68, was concerned that she was dehydrated and so stopped her lasix. Since that time she has had worsening edema now progressed to anasarca, SOB, DOE, orthopnea. Seen in cards office and had labs drawn last week on 12/23.  Cards put her on lasix 80mg  daily for 3 days then back to 60mg  daily.   Assessment/Plan:   Principal Problem:   CKD (chronic kidney disease) stage 3, GFR 30-59 ml/min (HCC) Active Problems:   Hypertension   Sick sinus syndrome (HCC)   Paroxysmal atrial fibrillation (HCC)   Biventricular cardiac pacemaker in situ   Acute on chronic combined systolic and diastolic CHF (congestive heart failure) (HCC)   AKI (acute kidney injury) ([place])   Dyspnea   Tricuspid valve regurgitation    Acute on chronic CHF - -echo: There is significant prolapse of the anterior leaflet with  posterior directed MR jet that appears moderate  but may be underestimating as eccentric jet and not well visualized on  apical views. Could consider TEE or cardiac MRI for further evaluation -IV lasix per cards being changed to PO on 1/2 -cards consult appreciated -daily weights/Strict intake and output -1221ml fluid restriction -compression hose (wears at home)  AKI on CKD 3b - -lasix per cards -Cr stable  SSS and biventricular PPM -  -follows with Dr. Caryl Comes  HTN - -Only on coreg at this  point -BP actually on low side  PAF - -Cont amiodarone but at a higher dose -Cont xarelto -per cards: device interrogated .  She has recent sustained decrease in impedance.  She also has had more atrial fib on her device and has had intermittent episodes repeatedly in on tele here.  I wonder if this is contributing to problems with volume.  I will let Dr. Caryl Comes know and I will increase the amiodarone to 200 mg and defer long term management to Dr. Caryl Comes.   ? UTI -present on admission U/A -dose of IV rocephin   Family Communication/Anticipated D/C date and plan/Code Status   DVT prophylaxis: xarelto Code Status: Full Code.  Disposition Plan: Status is: inpt LM for daughter 1/1 The patient will require care spanning > 2 midnights and should be moved to inpatient because: Inpatient level of care appropriate due to severity of illness  Dispo: The patient is from: Home              Anticipated d/c is to: Home              Anticipated d/c date is:1- 2 days              Patient currently is not medically stable to d/c.         Medical Consultants:    cards     Subjective:   Says she feels bad "all over"  Objective:  Vitals:   11/10/20 1502 11/10/20 2041 11/11/20 0645 11/11/20 1415  BP:  102/82 96/62 106/74  Pulse: 61 (!) 108 88 97  Resp: 20 16 16    Temp: 98.3 F (36.8 C) 97.8 F (36.6 C) 97.6 F (36.4 C) 98.1 F (36.7 C)  TempSrc: Oral Oral Oral Oral  SpO2:  97% 100% 95%  Weight:   67.4 kg   Height:        Intake/Output Summary (Last 24 hours) at 11/11/2020 1455 Last data filed at 11/10/2020 2035 Gross per 24 hour  Intake 240 ml  Output 800 ml  Net -560 ml   Filed Weights   11/08/20 0515 11/09/20 0442 11/11/20 0645  Weight: 68.9 kg 69.5 kg 67.4 kg    Exam:   General: Appearance:     Overweight female in no acute distress     Lungs:     respirations unlabored  Heart:    Normal heart rate. Irregular and fast at times  MS:   All extremities are  intact.   Neurologic:   Awake, alert                        Data Reviewed:   I have personally reviewed following labs and imaging studies:  Labs: Labs show the following:   Basic Metabolic Panel: Recent Labs  Lab 11/07/20 0530 11/08/20 0155 11/09/20 0142 11/10/20 0355 11/11/20 0233  NA 140 140 139 142 143  K 3.9 4.1 4.2 4.1 4.3  CL 104 102 105 105 104  CO2 24 29 27 27 28   GLUCOSE 124* 109* 103* 101* 102*  BUN 42* 46* 44* 43* 44*  CREATININE 1.91* 2.33* 1.93* 1.87* 2.15*  CALCIUM 9.3 9.2 9.0 9.3 9.4   GFR Estimated Creatinine Clearance: 16.3 mL/min (A) (by C-G formula based on SCr of 2.15 mg/dL (H)). Liver Function Tests: Recent Labs  Lab 11/08/20 0155  AST 31  ALT 30  ALKPHOS 79  BILITOT 1.8*  PROT 5.8*  ALBUMIN 2.8*   No results for input(s): LIPASE, AMYLASE in the last 168 hours. No results for input(s): AMMONIA in the last 168 hours. Coagulation profile No results for input(s): INR, PROTIME in the last 168 hours.  CBC: Recent Labs  Lab 11/06/20 1326 11/08/20 0155 11/09/20 0142 11/10/20 0355 11/11/20 0233  WBC 4.2 4.6 3.8* 3.8* 4.2  HGB 11.7* 10.1* 10.4* 10.5* 10.8*  HCT 38.4 35.4* 35.2* 35.9* 37.3  MCV 96.0 98.1 97.0 96.5 97.4  PLT 146* 123* 127* 120* 126*   Cardiac Enzymes: No results for input(s): CKTOTAL, CKMB, CKMBINDEX, TROPONINI in the last 168 hours. BNP (last 3 results) Recent Labs    03/02/20 0930  PROBNP 3,278*   CBG: No results for input(s): GLUCAP in the last 168 hours. D-Dimer: No results for input(s): DDIMER in the last 72 hours. Hgb A1c: No results for input(s): HGBA1C in the last 72 hours. Lipid Profile: No results for input(s): CHOL, HDL, LDLCALC, TRIG, CHOLHDL, LDLDIRECT in the last 72 hours. Thyroid function studies: No results for input(s): TSH, T4TOTAL, T3FREE, THYROIDAB in the last 72 hours.  Invalid input(s): FREET3 Anemia work up: No results for input(s): VITAMINB12, FOLATE, FERRITIN, TIBC, IRON,  RETICCTPCT in the last 72 hours. Sepsis Labs: Recent Labs  Lab 11/08/20 0155 11/09/20 0142 11/10/20 0355 11/11/20 0233  WBC 4.6 3.8* 3.8* 4.2    Microbiology Recent Results (from the past 240 hour(s))  SARS CORONAVIRUS 2 (TAT 6-24 HRS) Nasopharyngeal Nasopharyngeal Swab  Status: None   Collection Time: 11/06/20  8:27 PM   Specimen: Nasopharyngeal Swab  Result Value Ref Range Status   SARS Coronavirus 2 NEGATIVE NEGATIVE Final    Comment: (NOTE) SARS-CoV-2 target nucleic acids are NOT DETECTED.  The SARS-CoV-2 RNA is generally detectable in upper and lower respiratory specimens during the acute phase of infection. Negative results do not preclude SARS-CoV-2 infection, do not rule out co-infections with other pathogens, and should not be used as the sole basis for treatment or other patient management decisions. Negative results must be combined with clinical observations, patient history, and epidemiological information. The expected result is Negative.  Fact Sheet for Patients: SugarRoll.be  Fact Sheet for Healthcare Providers: https://www.woods-mathews.com/  This test is not yet approved or cleared by the [place] FDA and  has been authorized for detection and/or diagnosis of SARS-CoV-2 by FDA under an Emergency Use Authorization (EUA). This EUA will remain  in effect (meaning this test can be used) for the duration of the COVID-19 declaration under Se ction 564(b)(1) of the Act, 21 U.S.C. section 360bbb-3(b)(1), unless the authorization is terminated or revoked sooner.  Performed at [facility], [place] [address], [place], [place] [address]     Procedures and diagnostic studies:  No results found.  Medications:   . ALPRAZolam  0.5 mg Oral QHS  . amiodarone  200 mg Oral BID  . busPIRone  5 mg Oral BID  . carvedilol  6.25 mg Oral BID  . [START ON 11/12/2020] furosemide  60 mg Oral Daily  . potassium chloride   20 mEq Oral BID  . Rivaroxaban  15 mg Oral Q supper  . sodium chloride flush  3 mL Intravenous Q12H   Continuous Infusions: . sodium chloride    . cefTRIAXone (ROCEPHIN)  IV       LOS: 3 days   Geradine Girt  [facility]   How to contact the [facility] Attending or Consulting provider [place] or covering provider during after hours [place], for this patient?  1. Check the care team in [facility] and look for a) attending/consulting TRH provider listed and b) the [facility] team listed 2. Log into [email or web address] and use [place]'s universal password to access. If you do not have the password, please contact the hospital operator. 3. Locate the [facility] provider you are looking for under [facility] and page to a number that you can be directly reached. 4. If you still have difficulty reaching the provider, please page the [facility] (Director on Call) for the Hospitalists listed on amion for assistance.  11/11/2020, 2:55 PM

## 2020-11-11 NOTE — Plan of Care (Signed)
°  Problem: Fluid Volume: °Goal: Compliance with measures to maintain balanced fluid volume will improve °Outcome: Progressing °  °Problem: Nutritional: °Goal: Ability to make healthy dietary choices will improve °Outcome: Progressing °  °

## 2020-11-11 NOTE — Progress Notes (Signed)
Ambulated in hallway with walker and stand by assist. O2 sats on RA started at 92% and dropped to 85% after ambulating 50 ft. Increased DOE. Placed back on 2 Liters back to room. Will ambulate with oxygen after pt recovers.

## 2020-11-12 ENCOUNTER — Encounter: Payer: Medicare Other | Admitting: Student

## 2020-11-12 DIAGNOSIS — N1832 Chronic kidney disease, stage 3b: Secondary | ICD-10-CM | POA: Diagnosis not present

## 2020-11-12 DIAGNOSIS — R0600 Dyspnea, unspecified: Secondary | ICD-10-CM | POA: Diagnosis not present

## 2020-11-12 DIAGNOSIS — I509 Heart failure, unspecified: Secondary | ICD-10-CM | POA: Diagnosis not present

## 2020-11-12 DIAGNOSIS — I5043 Acute on chronic combined systolic (congestive) and diastolic (congestive) heart failure: Secondary | ICD-10-CM | POA: Diagnosis not present

## 2020-11-12 LAB — BASIC METABOLIC PANEL
Anion gap: 11 (ref 5–15)
BUN: 40 mg/dL — ABNORMAL HIGH (ref 8–23)
CO2: 27 mmol/L (ref 22–32)
Calcium: 9.4 mg/dL (ref 8.9–10.3)
Chloride: 103 mmol/L (ref 98–111)
Creatinine, Ser: 2.02 mg/dL — ABNORMAL HIGH (ref 0.44–1.00)
GFR, Estimated: 23 mL/min — ABNORMAL LOW (ref >60)
Glucose, Bld: 122 mg/dL — ABNORMAL HIGH (ref 70–99)
Potassium: 4.7 mmol/L (ref 3.5–5.1)
Sodium: 141 mmol/L (ref 135–145)

## 2020-11-12 LAB — MAGNESIUM: Magnesium: 1.9 mg/dL (ref 1.7–2.4)

## 2020-11-12 MED ORDER — CARVEDILOL 3.125 MG PO TABS: 3.1250 mg | ORAL_TABLET | Freq: Two times a day (BID) | ORAL | Status: DC

## 2020-11-12 MED ORDER — METOPROLOL TARTRATE 12.5 MG HALF TABLET
12.5000 mg | ORAL_TABLET | Freq: Two times a day (BID) | ORAL | Status: DC
  Administered 2020-11-12 – 2020-11-16 (×4): 12.5 mg via ORAL
  Filled 2020-11-12 (×8): qty 1

## 2020-11-12 NOTE — Progress Notes (Signed)
Progress Note  [name] Date of Encounter: 11/12/2020  [facility] Cardiologist: Virl Axe, MD   Subjective   She feels much better today, was able to rest last night. Still with orthopnea.  Inpatient Medications    Scheduled Meds: . ALPRAZolam  0.5 mg Oral QHS  . amiodarone  200 mg Oral BID  . busPIRone  5 mg Oral BID  . carvedilol  6.25 mg Oral BID  . furosemide  60 mg Oral Daily  . potassium chloride  20 mEq Oral BID  . Rivaroxaban  15 mg Oral Q supper  . sodium chloride flush  3 mL Intravenous Q12H   Continuous Infusions: . sodium chloride    . cefTRIAXone (ROCEPHIN)  IV 1 g (11/11/20 1515)   PRN Meds: sodium chloride, acetaminophen, ondansetron (ZOFRAN) IV, sodium chloride flush   Vital Signs    Vitals:   11/11/20 1415 11/11/20 2100 11/12/20 0621 11/12/20 0624  BP: 106/74 93/82 100/76   Pulse: 97 100 99   Resp:  18 18   Temp: 98.1 F (36.7 C) 98 F (36.7 C) 98.6 F (37 C)   TempSrc: Oral Oral Oral   SpO2: 95% 97% 94%   Weight:    67.4 kg  Height:        Intake/Output Summary (Last 24 hours) at 11/12/2020 0808 Last data filed at 11/12/2020 0600 Gross per 24 hour  Intake 220 ml  Output 300 ml  Net -80 ml   Last 3 Weights 11/12/2020 11/11/2020 11/09/2020  Weight (lbs) 148 lb 9.4 oz 148 lb 9.6 oz 153 lb 3.5 oz  Weight (kg) 67.4 kg 67.405 kg 69.5 kg      Telemetry    BiV pacing - Personally Reviewed  ECG    No new tracings - Personally Reviewed  Physical Exam   GEN: No acute distress.   Neck: No JVD Cardiac: RRR, no murmurs, rubs, or gallops.  Respiratory: Respirations unlabored, occasional crackles in left base GI: Soft, nontender, non-distended  MS: trace B LE edema; No deformity. Neuro:  Nonfocal  Psych: Normal affect   Labs    High Sensitivity Troponin:  No results for input(s): TROPONINIHS in the last 720 hours.    Chemistry Recent Labs  Lab 11/08/20 0155 11/09/20 0142 11/10/20 0355 11/11/20 0233  11/12/20 0213  NA 140   < > 142 143 141  K 4.1   < > 4.1 4.3 4.7  CL 102   < > 105 104 103  CO2 29   < > 27 28 27   GLUCOSE 109*   < > 101* 102* 122*  BUN 46*   < > 43* 44* 40*  CREATININE 2.33*   < > 1.87* 2.15* 2.02*  CALCIUM 9.2   < > 9.3 9.4 9.4  PROT 5.8*  --   --   --   --   ALBUMIN 2.8*  --   --   --   --   AST 31  --   --   --   --   ALT 30  --   --   --   --   ALKPHOS 79  --   --   --   --   BILITOT 1.8*  --   --   --   --   GFRNONAA 20*   < > 26* 22* 23*  ANIONGAP 9   < > 10 11 11    < > = values in this interval not  displayed.     Hematology Recent Labs  Lab 11/09/20 0142 11/10/20 0355 11/11/20 0233  WBC 3.8* 3.8* 4.2  RBC 3.63* 3.72* 3.83*  HGB 10.4* 10.5* 10.8*  HCT 35.2* 35.9* 37.3  MCV 97.0 96.5 97.4  MCH 28.7 28.2 28.2  MCHC 29.5* 29.2* 29.0*  RDW 16.1* 16.0* 16.5*  PLT 127* 120* 126*    BNP Recent Labs  Lab 11/06/20 1745  BNP 1,049.9*     DDimer No results for input(s): DDIMER in the last 168 hours.   Radiology    No results found.  Cardiac Studies   Echocardiogram 11/07/2020: 1. Left ventricular ejection fraction, by estimation, is 50 to 55%. The  left ventricle has low normal function. The left ventricle has no regional  wall motion abnormalities. There is mild left ventricular hypertrophy.  Left ventricular diastolic  parameters are indeterminate.  2. Right ventricular systolic function is moderately reduced. The right  ventricular size is moderately enlarged. There is mildly elevated  pulmonary artery systolic pressure. The estimated right ventricular  systolic pressure is 16.0 mmHg.  3. Right atrial size was severely dilated.  4. The mitral valve is degenerative. Moderate mitral annular  calcification. No evidence of mitral stenosis. Moderate mitral valve  regurgitation. There is significant prolapse of the anterior leaflet with  posterior directed MR jet that appears moderate  but may be underestimating as eccentric jet and  not well visualized on  apical views. Could consider TEE or cardiac MRI for further evaluation  5. Tricuspid valve regurgitation is moderate.  6. The aortic valve is tricuspid. Aortic valve regurgitation is not  visualized. Mild to moderate aortic valve sclerosis/calcification is  present, without any evidence of aortic stenosis.  7. The inferior vena cava is dilated in size with <50% respiratory  variability, suggesting right atrial pressure of 15 mmHg.   Patient Profile     87 y.o. 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 Of note, PCP noted sCr increased to 1.68 and stopped lasix due to concern for dehydration (  Assessment & Plan    Acute on chronic diastolic heart failure Hx of systolic heart failure - EF 25-30% Right ventricular dysfunction Moderate pulmonary hypertension - EF 25-30% in April 2021 felt to be due to 100% pacing - adjustments made to PPM --> repeat echo 07/2020 with normalized EF - last month, PCP suspected over-diuresis and stopped lasix --> presented with lower extremity edema, anasarca, DOE and orthopnea - IV diuresis transitioned to PO lasix 60 mg daily yesterday - overall net negative 2 L, I&Os incomplete - weight is 148 lbs from peak of 153 lbs this admission - she was wearing supplemental O2 when I entered the room, but maintained adequate saturations with it off during my exam - will need to ambulate in hall without O2 to prepare for discharge   Mitral regurgitation - not a surgical candidate - can consider evaluation by structural heart team should her MR worsen   Paroxysmal to persistent atrial fibrillation - amiodarone increased to 200 mg BID - will likely continue this for 14 days at discharge - Afib may be  contributing to HFpEF exacerbation - continue xarelto for stroke prevention - reduced dose of 15 mg xarelto - no bleeding   Acute on chronic renal  insufficiency - sCr improved to 2.02 (2.12), from peak 2.33   SSS s/p PPM - BiV pacing   Hypertension - continue coreg - pressures marginal - may need to reduce coreg or transition to lopressor      For questions or updates, please contact [place] Please consult [email or web address] for contact info under        Signed, Ledora Bottcher, PA  11/12/2020, 8:08 AM

## 2020-11-12 NOTE — TOC Initial Note (Addendum)
Transition of Care [facility]) - Initial/Assessment Note    Patient Details  Name: [name] MRN: [id number] Date of Birth: July 12, 1930  [facility]) CM/SW Contact:    Bethena Roys, RN Phone Number: 11/12/2020, 5:21 PM  Clinical Narrative:  Patient presented from home with spouse. Patient states she has been caring for her husband. Patient has support of daughters. Case Manager tried to call Butch Penny and the phone went to voicemail. Patient states she has a rollator in the home that she uses. Patient states that her husband has used [facility] in the past for home health services. Physical and Occupational Therapy orders entered in Epic to see if patient will need any HH Services once she leaves. Patient does not wear oxygen at home and will need an ambulatory saturation before she leaves. Case Manager is following for home and durable medical equipment needs.     1815 11-12-20 Case Manager was abl to speak with the daughter in the room regarding home care needs. Patient and spouse have PCS in the home with alternating times. Family has assistance on Sundays and three days during the week from 9 am -12 pm. Daughter is looking for additional help with personal care services. Brochures provided. If patient needs Home health please call Butch Penny to see which agency she will want to use for patient. Medicare.gov list provided. Case Manager will continue to follow.   Expected Discharge Plan: [place] Barriers to Discharge: Continued Medical Work up   Patient Goals and CMS Choice Patient states their goals for this hospitalization and ongoing recovery are:: to return home      Expected Discharge Plan and Services Expected Discharge Plan: [place] In-house Referral: NA Discharge Planning Services: CM Consult   Living arrangements for the past 2 months: Single Family Home                  Prior Living Arrangements/Services Living  arrangements for the past 2 months: Single Family Home Lives with:: Spouse (has support of daughters [name] and Butch Penny) Patient language and need for interpreter reviewed:: Yes        Need for Family Participation in Patient Care: Yes (Comment) Care giver support system in place?: Yes (comment) Current home services: DME (Pt uses a rollator in the home.) Criminal Activity/Legal Involvement Pertinent to Current Situation/Hospitalization: No - Comment as needed  Activities of Daily Living Home Assistive Devices/Equipment: Walker (specify type) ADL Screening (condition at time of admission) Patient's cognitive ability adequate to safely complete daily activities?: Yes Is the patient deaf or have difficulty hearing?: Yes Does the patient have difficulty seeing, even when wearing glasses/contacts?: No Does the patient have difficulty concentrating, remembering, or making decisions?: No Patient able to express need for assistance with ADLs?: Yes Does the patient have difficulty dressing or bathing?: No Independently performs ADLs?: Yes (appropriate for developmental age) Does the patient have difficulty walking or climbing stairs?: Yes Weakness of Legs: Both Weakness of Arms/Hands: None  Emotional Assessment Appearance:: Appears stated age Attitude/Demeanor/Rapport: Engaged Affect (typically observed): Appropriate Orientation: : Oriented to Self,Oriented to Place,Oriented to  Time,Oriented to Situation Alcohol / Substance Use: Not Applicable Psych Involvement: No (comment)  Admission diagnosis:  Acute on chronic combined systolic and diastolic CHF (congestive heart failure) ([place]) [I50.43] Acute on chronic congestive heart failure, unspecified heart failure type ([place]) [I50.9] Dyspnea [R06.00] Patient Active Problem List   Diagnosis Date Noted  . Dyspnea 11/08/2020  .  Acute on chronic combined systolic and diastolic CHF (congestive heart failure) ([place]) 11/06/2020  . CKD (chronic kidney  disease) stage 3, GFR 30-59 ml/min (HCC) 11/06/2020  . AKI (acute kidney injury) ([place]) 11/06/2020  . Orthostatic lightheadedness 08/06/2020  . Tricuspid valve regurgitation 05/2020  . Persistent atrial fibrillation ([place])   . Acute systolic heart failure ([place]) 03/26/2020  . Biventricular cardiac pacemaker in situ 01/23/2020  . Acute respiratory failure with hypoxia ([place]) 01/20/2019  . Paroxysmal atrial fibrillation (HCC)   . Sick sinus syndrome ([place]) 01/05/2017  . Overweight (BMI 25.0-29.9) 12/19/2015  . S/P left THA, AA 12/18/2015  . Chronic venous insufficiency 11/16/2015  . Varicose veins of both lower extremities 11/16/2015  . Edema 05/20/2012  . Bradycardia   . Hypertension   . Hyperlipidemia    PCP:  Chesley Noon, MD Pharmacy:   [facility] [place], [address] [place] [facility] [address] Phone: [phone] Fax: [phone]   Readmission Risk Interventions No flowsheet data found.

## 2020-11-12 NOTE — Progress Notes (Signed)
Progress Note    [name]  IRW:[id number] DOB: December 13, 1929  DOA: 11/06/2020 PCP: Chesley Noon, MD    Brief Narrative:    Medical records reviewed and are as summarized below:  [name] is an 87 y.o. female with medical history significant of A.Fib on amiodarone and Xarelto, HTN, SSS s/p PPM.  CHF, CKD. Pt with h/o dCHF previously, echo in April 2021 showed newly reduced EF 25-30%, cards suspected this possibly due to her being paced 100% of the time.  Looks like adjustments were made, repeat echo in Sept 2021 showed normal EF. Last month her PCP noted that creat had increased to 1.68, was concerned that she was dehydrated and so stopped her lasix. Since that time she has had worsening edema now progressed to anasarca, SOB, DOE, orthopnea. Seen in cards office and had labs drawn last week on 12/23.  Cards put her on lasix 80mg  daily for 3 days then back to 60mg  daily.   Assessment/Plan:   Principal Problem:   CKD (chronic kidney disease) stage 3, GFR 30-59 ml/min (HCC) Active Problems:   Hypertension   Sick sinus syndrome (HCC)   Paroxysmal atrial fibrillation (HCC)   Biventricular cardiac pacemaker in situ   Acute on chronic combined systolic and diastolic CHF (congestive heart failure) (HCC)   AKI (acute kidney injury) ([place])   Dyspnea   Tricuspid valve regurgitation    Acute on chronic CHF - -echo: There is significant prolapse of the anterior leaflet with  posterior directed MR jet that appears moderate  but may be underestimating as eccentric jet and not well visualized on  apical views. Could consider TEE or cardiac MRI for further evaluation -IV lasix per cards being changed to PO on 1/2 -cards consult appreciated: She would not be a candidate for open mitral valve repair even if severe MR. Not certain whether she would be reasonable consideration for MitraClip - this could be just discussed with the valve team as an outpatient. -daily weights/Strict  intake and output -1223ml fluid restriction -compression hose (wears at home)  AKI on CKD 3b - -lasix per cards -Cr stable  SSS and biventricular PPM -  -follows with Dr. Caryl Comes  HTN - -coreg changed to metoprolol  PAF - -Cont amiodarone but at a higher dose -Cont xarelto -change coreg to metoprolol -per cards: device interrogated .  She has recent sustained decrease in impedance.  She also has had more atrial fib on her device and has had intermittent episodes repeatedly in on tele here.  I wonder if this is contributing to problems with volume.  I will let Dr. Caryl Comes know and I will increase the amiodarone to 200 mg and defer long term management to Dr. Caryl Comes.   ? UTI -present on admission U/A -dose of IV rocephin x3 days   Family Communication/Anticipated D/C date and plan/Code Status   DVT prophylaxis: xarelto Code Status: Full Code.  Disposition Plan: Status is: inpt LM for daughter 1/1 The patient will require care spanning > 2 midnights and should be moved to inpatient because: Inpatient level of care appropriate due to severity of illness  Dispo: The patient is from: Home              Anticipated d/c is to: Home              Anticipated d/c date is: in AM              Patient currently is  not medically stable to d/c. needs walking O2 eval         Medical Consultants:    cards     Subjective:   feels better today-- up walking in room  Objective:    Vitals:   11/11/20 2100 11/12/20 0621 11/12/20 0624 11/12/20 1206  BP: 93/82 100/76  102/78  Pulse: 100 99  (!) 109  Resp: 18 18  18   Temp: 98 F (36.7 C) 98.6 F (37 C)  98.2 F (36.8 C)  TempSrc: Oral Oral  Oral  SpO2: 97% 94%  96%  Weight:   67.4 kg   Height:        Intake/Output Summary (Last 24 hours) at 11/12/2020 1623 Last data filed at 11/12/2020 1200 Gross per 24 hour  Intake 223 ml  Output 300 ml  Net -77 ml   Filed Weights   11/09/20 0442 11/11/20 0645 11/12/20 0624  Weight: 69.5  kg 67.4 kg 67.4 kg    Exam:    General: Appearance:     Overweight female in no acute distress- up at sink     Lungs:      respirations unlabored  Heart:    Tachycardic. irregular  MS:   All extremities are intact.   Neurologic:   Awake, alert- pleasant and cooperative    Data Reviewed:   I have personally reviewed following labs and imaging studies:  Labs: Labs show the following:   Basic Metabolic Panel: Recent Labs  Lab 11/08/20 0155 11/09/20 0142 11/10/20 0355 11/11/20 0233 11/12/20 0213  NA 140 139 142 143 141  K 4.1 4.2 4.1 4.3 4.7  CL 102 105 105 104 103  CO2 29 27 27 28 27   GLUCOSE 109* 103* 101* 102* 122*  BUN 46* 44* 43* 44* 40*  CREATININE 2.33* 1.93* 1.87* 2.15* 2.02*  CALCIUM 9.2 9.0 9.3 9.4 9.4  MG  --   --   --   --  1.9   GFR Estimated Creatinine Clearance: 17.3 mL/min (A) (by C-G formula based on SCr of 2.02 mg/dL (H)). Liver Function Tests: Recent Labs  Lab 11/08/20 0155  AST 31  ALT 30  ALKPHOS 79  BILITOT 1.8*  PROT 5.8*  ALBUMIN 2.8*   No results for input(s): LIPASE, AMYLASE in the last 168 hours. No results for input(s): AMMONIA in the last 168 hours. Coagulation profile No results for input(s): INR, PROTIME in the last 168 hours.  CBC: Recent Labs  Lab 11/06/20 1326 11/08/20 0155 11/09/20 0142 11/10/20 0355 11/11/20 0233  WBC 4.2 4.6 3.8* 3.8* 4.2  HGB 11.7* 10.1* 10.4* 10.5* 10.8*  HCT 38.4 35.4* 35.2* 35.9* 37.3  MCV 96.0 98.1 97.0 96.5 97.4  PLT 146* 123* 127* 120* 126*   Cardiac Enzymes: No results for input(s): CKTOTAL, CKMB, CKMBINDEX, TROPONINI in the last 168 hours. BNP (last 3 results) Recent Labs    03/02/20 0930  PROBNP 3,278*   CBG: No results for input(s): GLUCAP in the last 168 hours. D-Dimer: No results for input(s): DDIMER in the last 72 hours. Hgb A1c: No results for input(s): HGBA1C in the last 72 hours. Lipid Profile: No results for input(s): CHOL, HDL, LDLCALC, TRIG, CHOLHDL, LDLDIRECT  in the last 72 hours. Thyroid function studies: No results for input(s): TSH, T4TOTAL, T3FREE, THYROIDAB in the last 72 hours.  Invalid input(s): FREET3 Anemia work up: No results for input(s): VITAMINB12, FOLATE, FERRITIN, TIBC, IRON, RETICCTPCT in the last 72 hours. Sepsis Labs: Recent Labs  Lab 11/08/20  0155 11/09/20 0142 11/10/20 0355 11/11/20 0233  WBC 4.6 3.8* 3.8* 4.2    Microbiology Recent Results (from the past 240 hour(s))  SARS CORONAVIRUS 2 (TAT 6-24 HRS) Nasopharyngeal Nasopharyngeal Swab     Status: None   Collection Time: 11/06/20  8:27 PM   Specimen: Nasopharyngeal Swab  Result Value Ref Range Status   SARS Coronavirus 2 NEGATIVE NEGATIVE Final    Comment: (NOTE) SARS-CoV-2 target nucleic acids are NOT DETECTED.  The SARS-CoV-2 RNA is generally detectable in upper and lower respiratory specimens during the acute phase of infection. Negative results do not preclude SARS-CoV-2 infection, do not rule out co-infections with other pathogens, and should not be used as the sole basis for treatment or other patient management decisions. Negative results must be combined with clinical observations, patient history, and epidemiological information. The expected result is Negative.  Fact Sheet for Patients: SugarRoll.be  Fact Sheet for Healthcare Providers: https://www.woods-mathews.com/  This test is not yet approved or cleared by the [place] FDA and  has been authorized for detection and/or diagnosis of SARS-CoV-2 by FDA under an Emergency Use Authorization (EUA). This EUA will remain  in effect (meaning this test can be used) for the duration of the COVID-19 declaration under Se ction 564(b)(1) of the Act, 21 U.S.C. section 360bbb-3(b)(1), unless the authorization is terminated or revoked sooner.  Performed at [facility], [place] [address] [place],  [address]     Procedures and diagnostic  studies:  No results found.  Medications:   . ALPRAZolam  0.5 mg Oral QHS  . amiodarone  200 mg Oral BID  . busPIRone  5 mg Oral BID  . furosemide  60 mg Oral Daily  . metoprolol tartrate  12.5 mg Oral BID  . potassium chloride  20 mEq Oral BID  . Rivaroxaban  15 mg Oral Q supper  . sodium chloride flush  3 mL Intravenous Q12H   Continuous Infusions: . sodium chloride    . cefTRIAXone (ROCEPHIN)  IV 1 g (11/12/20 1558)     LOS: 4 days   Geradine Girt  [facility]   How to contact the [facility] Attending or Consulting provider [place] or covering provider during after hours [place], for this patient?  1. Check the care team in [facility] and look for a) attending/consulting TRH provider listed and b) the [facility] team listed 2. Log into [email or web address] and use [place]'s universal password to access. If you do not have the password, please contact the hospital operator. 3. Locate the [facility] provider you are looking for under [facility] and page to a number that you can be directly reached. 4. If you still have difficulty reaching the provider, please page the [facility] (Director on Call) for the Hospitalists listed on amion for assistance.  11/12/2020, 4:23 PM

## 2020-11-12 NOTE — Progress Notes (Addendum)
Progress Note  [name] Date of Encounter: 11/12/2020  [facility] Cardiologist: Virl Axe, MD   Subjective   She feels much better today, was able to rest last night. Still with orthopnea.  Inpatient Medications    Scheduled Meds: . ALPRAZolam  0.5 mg Oral QHS  . amiodarone  200 mg Oral BID  . busPIRone  5 mg Oral BID  . carvedilol  6.25 mg Oral BID  . furosemide  60 mg Oral Daily  . potassium chloride  20 mEq Oral BID  . Rivaroxaban  15 mg Oral Q supper  . sodium chloride flush  3 mL Intravenous Q12H   Continuous Infusions: . sodium chloride    . cefTRIAXone (ROCEPHIN)  IV 1 g (11/11/20 1515)   PRN Meds: sodium chloride, acetaminophen, ondansetron (ZOFRAN) IV, sodium chloride flush   Vital Signs    Vitals:   11/11/20 1415 11/11/20 2100 11/12/20 0621 11/12/20 0624  BP: 106/74 93/82 100/76   Pulse: 97 100 99   Resp:  18 18   Temp: 98.1 F (36.7 C) 98 F (36.7 C) 98.6 F (37 C)   TempSrc: Oral Oral Oral   SpO2: 95% 97% 94%   Weight:    67.4 kg  Height:        Intake/Output Summary (Last 24 hours) at 11/12/2020 0808 Last data filed at 11/12/2020 0600 Gross per 24 hour  Intake 220 ml  Output 300 ml  Net -80 ml   Last 3 Weights 11/12/2020 11/11/2020 11/09/2020  Weight (lbs) 148 lb 9.4 oz 148 lb 9.6 oz 153 lb 3.5 oz  Weight (kg) 67.4 kg 67.405 kg 69.5 kg      Telemetry    BiV pacing - Personally Reviewed  ECG    No new tracings - Personally Reviewed  Physical Exam   GEN: No acute distress.   Neck: No JVD Cardiac: RRR, no murmurs, rubs, or gallops.  Respiratory: Respirations unlabored, occasional crackles in left base GI: Soft, nontender, non-distended  MS: trace B LE edema; No deformity. Neuro:  Nonfocal  Psych: Normal affect   Labs    High Sensitivity Troponin:  No results for input(s): TROPONINIHS in the last 720 hours.    Chemistry Recent Labs  Lab 11/08/20 0155 11/09/20 0142 11/10/20 0355 11/11/20 0233  11/12/20 0213  NA 140   < > 142 143 141  K 4.1   < > 4.1 4.3 4.7  CL 102   < > 105 104 103  CO2 29   < > 27 28 27   GLUCOSE 109*   < > 101* 102* 122*  BUN 46*   < > 43* 44* 40*  CREATININE 2.33*   < > 1.87* 2.15* 2.02*  CALCIUM 9.2   < > 9.3 9.4 9.4  PROT 5.8*  --   --   --   --   ALBUMIN 2.8*  --   --   --   --   AST 31  --   --   --   --   ALT 30  --   --   --   --   ALKPHOS 79  --   --   --   --   BILITOT 1.8*  --   --   --   --   GFRNONAA 20*   < > 26* 22* 23*  ANIONGAP 9   < > 10 11 11    < > = values in this interval not  displayed.     Hematology Recent Labs  Lab 11/09/20 0142 11/10/20 0355 11/11/20 0233  WBC 3.8* 3.8* 4.2  RBC 3.63* 3.72* 3.83*  HGB 10.4* 10.5* 10.8*  HCT 35.2* 35.9* 37.3  MCV 97.0 96.5 97.4  MCH 28.7 28.2 28.2  MCHC 29.5* 29.2* 29.0*  RDW 16.1* 16.0* 16.5*  PLT 127* 120* 126*    BNP Recent Labs  Lab 11/06/20 1745  BNP 1,049.9*     DDimer No results for input(s): DDIMER in the last 168 hours.   Radiology    No results found.  Cardiac Studies   Echocardiogram 11/07/2020: 1. Left ventricular ejection fraction, by estimation, is 50 to 55%. The  left ventricle has low normal function. The left ventricle has no regional  wall motion abnormalities. There is mild left ventricular hypertrophy.  Left ventricular diastolic  parameters are indeterminate.  2. Right ventricular systolic function is moderately reduced. The right  ventricular size is moderately enlarged. There is mildly elevated  pulmonary artery systolic pressure. The estimated right ventricular  systolic pressure is 27.0 mmHg.  3. Right atrial size was severely dilated.  4. The mitral valve is degenerative. Moderate mitral annular  calcification. No evidence of mitral stenosis. Moderate mitral valve  regurgitation. There is significant prolapse of the anterior leaflet with  posterior directed MR jet that appears moderate  but may be underestimating as eccentric jet and  not well visualized on  apical views. Could consider TEE or cardiac MRI for further evaluation  5. Tricuspid valve regurgitation is moderate.  6. The aortic valve is tricuspid. Aortic valve regurgitation is not  visualized. Mild to moderate aortic valve sclerosis/calcification is  present, without any evidence of aortic stenosis.  7. The inferior vena cava is dilated in size with <50% respiratory  variability, suggesting right atrial pressure of 15 mmHg.   Patient Profile     87 y.o. 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 Of note, PCP noted sCr increased to 1.68 and stopped lasix due to concern for dehydration (  Assessment & Plan    Acute on chronic diastolic heart failure Hx of systolic heart failure - EF 25-30% Right ventricular dysfunction Moderate pulmonary hypertension - EF 25-30% in April 2021 felt to be due to 100% pacing - adjustments made to PPM --> repeat echo 07/2020  LVEF 50 to 55% - last month, PCP suspected over-diuresis and stopped lasix --> presented with lower extremity edema, anasarca, DOE and orthopnea - IV diuresis transitioned to PO lasix 60 mg daily yesterday - overall net negative 2 L this admit  , I&Os incomplete - weight is 148 lbs from peak of 153 lbs this admission - she was wearing supplemental O2 when I entered the room, but maintained adequate saturations with it off during my exam - will need to ambulate in hall without O2 to prepare for discharge   Mitral regurgitation - not a surgical candidate - could  consider evaluation by structural heart team should her MR worsen For MitraClip   Paroxysmal to persistent atrial fibrillation - amiodarone increased to 200 mg BID - Will continue this after d/c   Big question  is how long  Do not want to taper too quickly with recurrence of afib   As afib may be  contributing to HFpEF exacerbation - continue xarelto for stroke prevention - reduced dose of 15 mg xarelto - no bleeding   Acute on chronic renal insufficiency - sCr improved to 2.02 (2.12), from peak 2.33   SSS s/p PPM - s/p PPM, upgraded to BiV pacer in July 2021   Hypertension - continue coreg - pressures marginal - may need to reduce coreg or transition to lopressor      For questions or updates, please contact [facility] Please consult [email or web address] for contact info under        Signed, Ledora Bottcher, PA  11/12/2020, 8:08 AM     Pt seen and examined   I have amended note above to reflect my findings Pt is comfortable in bed   Has been walking some  ON exam: Lungs are clear to auscultation Cardiac exam:  RRR  No S3 Abdomen is supple Ext are without edema  Pt maintaining SR on amiodarone   I would recomm keeping on 200 bid and tapering as outpt  Not too quickly.    Would like to make sure pt is comfortable ambulating before goes home   Dorris Carnes MD

## 2020-11-12 NOTE — Discharge Summary (Addendum)
error 

## 2020-11-12 NOTE — Plan of Care (Signed)
  Problem: Fluid Volume: Goal: Compliance with measures to maintain balanced fluid volume will improve Outcome: Progressing   

## 2020-11-13 ENCOUNTER — Other Ambulatory Visit: Payer: Medicare Other

## 2020-11-13 DIAGNOSIS — N390 Urinary tract infection, site not specified: Secondary | ICD-10-CM | POA: Diagnosis not present

## 2020-11-13 DIAGNOSIS — N1832 Chronic kidney disease, stage 3b: Secondary | ICD-10-CM | POA: Diagnosis not present

## 2020-11-13 DIAGNOSIS — I509 Heart failure, unspecified: Secondary | ICD-10-CM | POA: Diagnosis not present

## 2020-11-13 DIAGNOSIS — I5043 Acute on chronic combined systolic (congestive) and diastolic (congestive) heart failure: Secondary | ICD-10-CM | POA: Diagnosis not present

## 2020-11-13 MED ORDER — FUROSEMIDE 10 MG/ML IJ SOLN
80.0000 mg | Freq: Once | INTRAMUSCULAR | Status: AC
  Administered 2020-11-13: 80 mg via INTRAVENOUS
  Filled 2020-11-13: qty 8

## 2020-11-13 NOTE — Progress Notes (Addendum)
Pt ambulated in hallway without oxygen and sats were 89-93% without distress. Carroll Kinds RN

## 2020-11-13 NOTE — Progress Notes (Signed)
Progress Note  [name] Date of Encounter: 11/12/2020  [facility] Cardiologist: Virl Axe, MD   Subjective   Pt a little SOB this AM  No CP   Concerned ankles up a little   Inpatient Medications    Scheduled Meds: . ALPRAZolam  0.5 mg Oral QHS  . amiodarone  200 mg Oral BID  . busPIRone  5 mg Oral BID  . carvedilol  6.25 mg Oral BID  . furosemide  60 mg Oral Daily  . potassium chloride  20 mEq Oral BID  . Rivaroxaban  15 mg Oral Q supper  . sodium chloride flush  3 mL Intravenous Q12H   Continuous Infusions: . sodium chloride    . cefTRIAXone (ROCEPHIN)  IV 1 g (11/11/20 1515)   PRN Meds: sodium chloride, acetaminophen, ondansetron (ZOFRAN) IV, sodium chloride flush   Vital Signs    Vitals:   11/11/20 1415 11/11/20 2100 11/12/20 0621 11/12/20 0624  BP: 106/74 93/82 100/76   Pulse: 97 100 99   Resp:  18 18   Temp: 98.1 F (36.7 C) 98 F (36.7 C) 98.6 F (37 C)   TempSrc: Oral Oral Oral   SpO2: 95% 97% 94%   Weight:    67.4 kg  Height:        Intake/Output Summary (Last 24 hours) at 11/12/2020 0808 Last data filed at 11/12/2020 0600 Gross per 24 hour  Intake 220 ml  Output 300 ml  Net -80 ml   Last 3 Weights 11/12/2020 11/11/2020 11/09/2020  Weight (lbs) 148 lb 9.4 oz 148 lb 9.6 oz 153 lb 3.5 oz  Weight (kg) 67.4 kg 67.405 kg 69.5 kg      Telemetry    BiV pacing   Abupt changes in HR/ rhythm  AV paced the ventricular paced about 100   Personally Reviewed  ECG    No new tracings - Personally Reviewed  Physical Exam   GEN: No acute distress.   Neck: JVP is increased Cardiac: RRR, no murmurs, rubs, or gallops.  Respiratory: Crackles at L base   GI: Soft, nontender, non-distended  MS: trace B LE edema; No deformity. Neuro:  Nonfocal  Psych: Normal affect   Labs    High Sensitivity Troponin:  No results for input(s): TROPONINIHS in the last 720 hours.    Chemistry Recent Labs  Lab 11/08/20 0155 11/09/20 0142  11/10/20 0355 11/11/20 0233 11/12/20 0213  NA 140   < > 142 143 141  K 4.1   < > 4.1 4.3 4.7  CL 102   < > 105 104 103  CO2 29   < > 27 28 27   GLUCOSE 109*   < > 101* 102* 122*  BUN 46*   < > 43* 44* 40*  CREATININE 2.33*   < > 1.87* 2.15* 2.02*  CALCIUM 9.2   < > 9.3 9.4 9.4  PROT 5.8*  --   --   --   --   ALBUMIN 2.8*  --   --   --   --   AST 31  --   --   --   --   ALT 30  --   --   --   --   ALKPHOS 79  --   --   --   --   BILITOT 1.8*  --   --   --   --   GFRNONAA 20*   < > 26* 22* 23*  ANIONGAP 9   < > 10 11 11    < > = values in this interval not displayed.     Hematology Recent Labs  Lab 11/09/20 0142 11/10/20 0355 11/11/20 0233  WBC 3.8* 3.8* 4.2  RBC 3.63* 3.72* 3.83*  HGB 10.4* 10.5* 10.8*  HCT 35.2* 35.9* 37.3  MCV 97.0 96.5 97.4  MCH 28.7 28.2 28.2  MCHC 29.5* 29.2* 29.0*  RDW 16.1* 16.0* 16.5*  PLT 127* 120* 126*    BNP Recent Labs  Lab 11/06/20 1745  BNP 1,049.9*     DDimer No results for input(s): DDIMER in the last 168 hours.   Radiology    No results found.  Cardiac Studies   Echocardiogram 11/07/2020: 1. Left ventricular ejection fraction, by estimation, is 50 to 55%. The  left ventricle has low normal function. The left ventricle has no regional  wall motion abnormalities. There is mild left ventricular hypertrophy.  Left ventricular diastolic  parameters are indeterminate.  2. Right ventricular systolic function is moderately reduced. The right  ventricular size is moderately enlarged. There is mildly elevated  pulmonary artery systolic pressure. The estimated right ventricular  systolic pressure is 00.9 mmHg.  3. Right atrial size was severely dilated.  4. The mitral valve is degenerative. Moderate mitral annular  calcification. No evidence of mitral stenosis. Moderate mitral valve  regurgitation. There is significant prolapse of the anterior leaflet with  posterior directed MR jet that appears moderate  but may be  underestimating as eccentric jet and not well visualized on  apical views. Could consider TEE or cardiac MRI for further evaluation  5. Tricuspid valve regurgitation is moderate.  6. The aortic valve is tricuspid. Aortic valve regurgitation is not  visualized. Mild to moderate aortic valve sclerosis/calcification is  present, without any evidence of aortic stenosis.  7. The inferior vena cava is dilated in size with <50% respiratory  variability, suggesting right atrial pressure of 15 mmHg.   Patient Profile     87 y.o. 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 Of note, PCP noted sCr increased to 1.68 and stopped lasix due to concern for dehydration (  Assessment & Plan    Acute on chronic diastolic heart failure Hx of systolic heart failure - EF 25-30% Right ventricular dysfunction Moderate pulmonary hypertension - EF 25-30% in April 2021 felt to be due to 100% pacing - adjustments made to PPM --> repeat echo 07/2020 with normalized EF - last month, PCP suspected over-diuresis and stopped lasix --> presented with lower extremity edema, anasarca, DOE and orthopnea -Pt with increased SOB this AM   She has diuresed 2.5 L since admit    I would recomm repeat IV lasix today  Follow    Mitral regurgitation -MR at least moderate    Medical Rx for now   Will reivew echo     Paroxysmal to persistent atrial fibrillation - Patient on amiodarone 200 bid     Still having intermitt jumps in HR   ? If PM mediated tachycardia   Will have device interrogated   Rates are not extreme.   But, pt with LV dysfunction may be very sensitive     Acute on chronic renal insufficiency - No labs today   WIll recheck  BMET  and BNP  SSS s/p PPM - s/p PPM      Hypertension Pt switched from Coreg to metoprolol with marginal BP BP is OK today   Follow   On tartrate right now.         For questions or updates, please contact [place] Please consult [email or web address] for contact info under        Signed, Ledora Bottcher, PA  11/12/2020, 8:08 AM

## 2020-11-13 NOTE — Progress Notes (Signed)
Progress Note    [name]  NAT:[id number] DOB: [birth year]  DOA: 11/06/2020 PCP: Chesley Noon, MD    Brief Narrative:    Medical records reviewed and are as summarized below:  [name] is an 87 y.o. female with medical history significant of A.Fib on amiodarone and Xarelto, HTN, SSS s/p PPM.  CHF, CKD. Pt with h/o dCHF previously, echo in April 2021 showed newly reduced EF 25-30%, cards suspected this possibly due to her being paced 100% of the time.  Looks like adjustments were made, repeat echo in Sept 2021 showed normal EF. Last month her PCP noted that creat had increased to 1.68, was concerned that she was dehydrated and so stopped her lasix. Since that time she has had worsening edema now progressed to anasarca, SOB, DOE, orthopnea. Seen in cards office and had labs drawn last week on 12/23.  Cards put her on lasix 80mg  daily for 3 days then back to 60mg  daily.  Slow to improve with treatment of presumed UTI and adjustment of rate controlling meds and diuretics   Assessment/Plan:   Principal Problem:   CKD (chronic kidney disease) stage 3, GFR 30-59 ml/min (HCC) Active Problems:   Hypertension   Sick sinus syndrome (HCC)   Paroxysmal atrial fibrillation (HCC)   Biventricular cardiac pacemaker in situ   Acute on chronic combined systolic and diastolic CHF (congestive heart failure) (HCC)   AKI (acute kidney injury) ([facility])   Dyspnea   Tricuspid valve regurgitation    Acute on chronic CHF - -echo: There is significant prolapse of the anterior leaflet with  posterior directed MR jet that appears moderate  but may be underestimating as eccentric jet and not well visualized on  apical views. Could consider TEE or cardiac MRI for further evaluation -IV lasix per cards being changed to PO on 1/2 -cards consult appreciated: She would not be a candidate for open mitral valve repair even if severe MR. Not certain whether she would be reasonable consideration for  MitraClip - this could be just discussed with the valve team as an outpatient. -daily weights/Strict intake and output -1236ml fluid restriction -compression hose (wears at home)  AKI on CKD 3b - -lasix per cards- given IV dose 1/4 -Cr stable  SSS and biventricular PPM -  -follows with Dr. Caryl Comes  HTN - -coreg changed to metoprolol  PAF - -Cont amiodarone but at a higher dose -Cont xarelto -change coreg to metoprolol -per cards: device interrogated .  She has recent sustained decrease in impedance.  She also has had more atrial fib on her device and has had intermittent episodes repeatedly in on tele here.  I wonder if this is contributing to problems with volume.  I will let Dr. Caryl Comes know and I will increase the amiodarone to 200 mg and defer long term management to Dr. Caryl Comes.   ? UTI -present on admission U/A -dose of IV rocephin x3 days- completed 1/4   Family Communication/Anticipated D/C date and plan/Code Status   DVT prophylaxis: xarelto Code Status: Full Code.  Disposition Plan: Status is: inpt LM for daughter 1/3 The patient will require care spanning > 2 midnights and should be moved to inpatient because: Inpatient level of care appropriate due to severity of illness  Dispo: The patient is from: Home              Anticipated d/c is to: Home  Anticipated d/c date is: in AM              Patient currently is not medically stable to d/c.  Cardiology sign off         Medical Consultants:    cards     Subjective:   Eating ok No fever, no chills +edema around ankles  Objective:    Vitals:   11/12/20 1206 11/12/20 1633 11/12/20 2040 11/13/20 0428  BP: 102/78 125/75 114/81 111/77  Pulse: (!) 109 64 (!) 113 94  Resp: 18 18 16 18   Temp: 98.2 F (36.8 C) 98.7 F (37.1 C) 97.8 F (36.6 C) (!) 97.3 F (36.3 C)  TempSrc: Oral Oral Oral Oral  SpO2: 96%  98% 94%  Weight:      Height:        Intake/Output Summary (Last 24 hours) at  11/13/2020 1622 Last data filed at 11/12/2020 2046 Gross per 24 hour  Intake -  Output 300 ml  Net -300 ml   Filed Weights   11/09/20 0442 11/11/20 0645 11/12/20 0624  Weight: 69.5 kg 67.4 kg 67.4 kg    Exam:   General: Appearance:     Overweight female in no acute distress     Lungs:     Not on O2, respirations unlabored  Heart:    Normal heart rate. irr  MS:   All extremities are intact.   Neurologic:   Awake, alert     Data Reviewed:   I have personally reviewed following labs and imaging studies:  Labs: Labs show the following:   Basic Metabolic Panel: Recent Labs  Lab 11/08/20 0155 11/09/20 0142 11/10/20 0355 11/11/20 0233 11/12/20 0213  NA 140 139 142 143 141  K 4.1 4.2 4.1 4.3 4.7  CL 102 105 105 104 103  CO2 29 27 27 28 27   GLUCOSE 109* 103* 101* 102* 122*  BUN 46* 44* 43* 44* 40*  CREATININE 2.33* 1.93* 1.87* 2.15* 2.02*  CALCIUM 9.2 9.0 9.3 9.4 9.4  MG  --   --   --   --  1.9   GFR Estimated Creatinine Clearance: 17.3 mL/min (A) (by C-G formula based on SCr of 2.02 mg/dL (H)). Liver Function Tests: Recent Labs  Lab 11/08/20 0155  AST 31  ALT 30  ALKPHOS 79  BILITOT 1.8*  PROT 5.8*  ALBUMIN 2.8*   No results for input(s): LIPASE, AMYLASE in the last 168 hours. No results for input(s): AMMONIA in the last 168 hours. Coagulation profile No results for input(s): INR, PROTIME in the last 168 hours.  CBC: Recent Labs  Lab 11/08/20 0155 11/09/20 0142 11/10/20 0355 11/11/20 0233  WBC 4.6 3.8* 3.8* 4.2  HGB 10.1* 10.4* 10.5* 10.8*  HCT 35.4* 35.2* 35.9* 37.3  MCV 98.1 97.0 96.5 97.4  PLT 123* 127* 120* 126*   Cardiac Enzymes: No results for input(s): CKTOTAL, CKMB, CKMBINDEX, TROPONINI in the last 168 hours. BNP (last 3 results) Recent Labs    03/02/20 0930  PROBNP 3,278*   CBG: No results for input(s): GLUCAP in the last 168 hours. D-Dimer: No results for input(s): DDIMER in the last 72 hours. Hgb A1c: No results for  input(s): HGBA1C in the last 72 hours. Lipid Profile: No results for input(s): CHOL, HDL, LDLCALC, TRIG, CHOLHDL, LDLDIRECT in the last 72 hours. Thyroid function studies: No results for input(s): TSH, T4TOTAL, T3FREE, THYROIDAB in the last 72 hours.  Invalid input(s): FREET3 Anemia work up: No results for input(s):  VITAMINB12, FOLATE, FERRITIN, TIBC, IRON, RETICCTPCT in the last 72 hours. Sepsis Labs: Recent Labs  Lab 11/08/20 0155 11/09/20 0142 11/10/20 0355 11/11/20 0233  WBC 4.6 3.8* 3.8* 4.2    Microbiology Recent Results (from the past 240 hour(s))  SARS CORONAVIRUS 2 (TAT 6-24 HRS) Nasopharyngeal Nasopharyngeal Swab     Status: None   Collection Time: 11/06/20  8:27 PM   Specimen: Nasopharyngeal Swab  Result Value Ref Range Status   SARS Coronavirus 2 NEGATIVE NEGATIVE Final    Comment: (NOTE) SARS-CoV-2 target nucleic acids are NOT DETECTED.  The SARS-CoV-2 RNA is generally detectable in upper and lower respiratory specimens during the acute phase of infection. Negative results do not preclude SARS-CoV-2 infection, do not rule out co-infections with other pathogens, and should not be used as the sole basis for treatment or other patient management decisions. Negative results must be combined with clinical observations, patient history, and epidemiological information. The expected result is Negative.  Fact Sheet for Patients: SugarRoll.be  Fact Sheet for Healthcare Providers: https://www.woods-mathews.com/  This test is not yet approved or cleared by the [place] FDA and  has been authorized for detection and/or diagnosis of SARS-CoV-2 by FDA under an Emergency Use Authorization (EUA). This EUA will remain  in effect (meaning this test can be used) for the duration of the COVID-19 declaration under Se ction 564(b)(1) of the Act, 21 U.S.C. section 360bbb-3(b)(1), unless the authorization is terminated or revoked  sooner.  Performed at [facility], [place] [address], [place],  [address]     Procedures and diagnostic studies:  No results found.  Medications:   . ALPRAZolam  0.5 mg Oral QHS  . amiodarone  200 mg Oral BID  . busPIRone  5 mg Oral BID  . metoprolol tartrate  12.5 mg Oral BID  . potassium chloride  20 mEq Oral BID  . Rivaroxaban  15 mg Oral Q supper  . sodium chloride flush  3 mL Intravenous Q12H   Continuous Infusions: . sodium chloride    . cefTRIAXone (ROCEPHIN)  IV 1 g (11/13/20 1604)     LOS: 5 days   Geradine Girt  [facility]   How to contact the [facility] Attending or Consulting provider [place] or covering provider during after hours [place], for this patient?  1. Check the care team in [facility] and look for a) attending/consulting TRH provider listed and b) the [facility] team listed 2. Log into [email or web address] and use [place]'s universal password to access. If you do not have the password, please contact the hospital operator. 3. Locate the [facility] provider you are looking for under [facility] and page to a number that you can be directly reached. 4. If you still have difficulty reaching the provider, please page the [facility] (Director on Call) for the Hospitalists listed on amion for assistance.  11/13/2020, 4:22 PM

## 2020-11-13 NOTE — TOC Transition Note (Signed)
Transition of Care [facility]) - CM/SW Discharge Note   Patient Details  Name: [name] MRN: [id number] Date of Birth: Mar 03, 1930  Transition of Care [facility]) CM/SW Contact:  Zenon Mayo, RN Phone Number: 11/13/2020, 12:55 PM   Clinical Narrative:    NCM spoke with Butch Penny the daughter,she states she does not have a preference for the [facility] agency but she states they were not satisfied with [employer] for the home oxygen that they have for her Dad. NCM made [facility] to [facility] with [facility] for HHPT, he is able to take referral. Soc will begin 24 to 48hrs post dc.   Final next level of care: [place] Barriers to Discharge: Continued Medical Work up   Patient Goals and CMS Choice Patient states their goals for this hospitalization and ongoing recovery are:: to return home      Discharge Placement                       Discharge Plan and Services In-house Referral: NA Discharge Planning Services: CM Consult              DME Agency: NA       HH Arranged: PT HH Agency: [place] Date [facility] Agency Contacted: 11/13/20 Time [place]: [id number] Representative spoke with at [place]: [place] ([place]) Interventions     Readmission Risk Interventions No flowsheet data found.

## 2020-11-13 NOTE — Progress Notes (Signed)
Occupational Therapy Evaluation Patient Details Name: [name] MRN: [id number] DOB: [birth year] Today's Date: 11/13/2020    History of Present Illness 87 y.o. female with a hx of NICM, BiV ICD, atrial fib persistent, high grade AV block with PPM, on amiodarone, hyperthyroidism, anemia and lower ext edema, CKD 3-4  who is being seen for the evaluation of acute on chronic diastolic heart failure with right ventricular dysfunction and PAF   Clinical Impression   Pt reports being mod indep at baseline with all ADL's and using Rollator for mobility primarily in the home, endorsing that she uses seat on rollator when onset of fatigue occurs. Husband has aide that comes 4x week to A with his ADL's who will also occasionally assist with IADL's. Pt with decreased activity tolerance, balance and strength leading to need for min guard for transfers and standing ADL's also with self reported fatigue after brief transfer training. OT will continue to follow acutely, with rec listed below.     Follow Up Recommendations  Home health OT;Supervision - Intermittent    Equipment Recommendations  Other (comment) (may benefit from long handled tools for increased ease and safety with ADL's)    Recommendations for Other Services       Precautions / Restrictions Precautions Precautions: Fall Restrictions Weight Bearing Restrictions: No      Mobility Bed Mobility Overal bed mobility: Modified Independent             General bed mobility comments: HOB elevated    Transfers Overall transfer level: Needs assistance   Transfers: Sit to/from Stand Sit to Stand: Min guard         General transfer comment: VC for hand placement for transition to and from standing for safety    Balance Overall balance assessment: Needs assistance Sitting-balance support: Feet supported Sitting balance-Leahy Scale: Fair     Standing balance support: Single extremity supported Standing balance-Leahy  Scale: Poor                             ADL either performed or assessed with clinical judgement   ADL Overall ADL's : Needs assistance/impaired Eating/Feeding: Independent   Grooming: Min guard;Standing           Upper Body Dressing : Supervision/safety;Sitting   Lower Body Dressing: Min guard   Toilet Transfer: Min guard;RW   Toileting- Clothing Manipulation and Hygiene: Min guard       Functional mobility during ADLs: Min guard;Rolling walker General ADL Comments: overall pt presents with good safety and awareness of limitations, reporting fatigue and SOB with only brief transfers and standing tasks leading to need for rest break for recovery. demo's benefit form min guard for safety with task completion, no LOB.     Vision Baseline Vision/History: Wears glasses Wears Glasses: At all times       Perception     Praxis      Pertinent Vitals/Pain Pain Assessment: No/denies pain     Hand Dominance Right   Extremity/Trunk Assessment Upper Extremity Assessment Upper Extremity Assessment: Overall WFL for tasks assessed   Lower Extremity Assessment Lower Extremity Assessment: Defer to PT evaluation       Communication Communication Communication: HOH   Cognition Arousal/Alertness: Awake/alert Behavior During Therapy: WFL for tasks assessed/performed Overall Cognitive Status: Within Functional Limits for tasks assessed  General Comments  On RA on arrival, with exertion fluctuating 88-90 with rest and breathing support improved to 92% after activity on RA.    Exercises     Shoulder Instructions      Home Living Family/patient expects to be discharged to:: Private residence Living Arrangements: Spouse/significant other Available Help at Discharge: Friend(s);Available PRN/intermittently Type of Home: House Home Access: Level entry     Home Layout: One level     Bathroom Shower/Tub:  Occupational psychologist: Handicapped height Bathroom Accessibility: Yes   Home Equipment: Environmental consultant - 4 wheels;Shower seat;Bedside commode;Grab bars - tub/shower;Grab bars - toilet          Prior Functioning/Environment Level of Independence: Independent with assistive device(s)        Comments: some A throughout week for IADL's from husband Aide that comes 4x.week        OT Problem List: Decreased strength;Decreased activity tolerance;Impaired balance (sitting and/or standing);Decreased safety awareness;Decreased knowledge of use of DME or AE;Cardiopulmonary status limiting activity      OT Treatment/Interventions: Self-care/ADL training;Therapeutic exercise;DME and/or AE instruction;Therapeutic activities;Patient/family education;Energy conservation    OT Goals(Current goals can be found in the care plan section) Acute Rehab OT Goals Patient Stated Goal: I want to be as indep as I can OT Goal Formulation: With patient Time For Goal Achievement: 11/27/20 Potential to Achieve Goals: Good  OT Frequency: Min 2X/week   Barriers to D/C:            Co-evaluation              AM-PAC OT "6 Clicks" Daily Activity     Outcome Measure Help from another person eating meals?: None Help from another person taking care of personal grooming?: None Help from another person toileting, which includes using toliet, bedpan, or urinal?: A Little Help from another person bathing (including washing, rinsing, drying)?: A Little Help from another person to put on and taking off regular upper body clothing?: A Little Help from another person to put on and taking off regular lower body clothing?: A Little 6 Click Score: 20   End of Session Equipment Utilized During Treatment: Rolling walker  Activity Tolerance: Patient tolerated treatment well Patient left: in chair;with call bell/phone within reach  OT Visit Diagnosis: Muscle weakness (generalized) (M62.81)                 Time: 0762-2633 OT Time Calculation (min): 12 min Charges:  OT General Charges $OT Visit: 1 Visit OT Evaluation $OT Eval Low Complexity: 1 Low  Malania Gawthrop OTR/L acute rehab services Office: [phone]  Toula Moos Aunya Lemler 11/13/2020, 2:23 PM

## 2020-11-13 NOTE — Evaluation (Signed)
Physical Therapy Evaluation Patient Details Name: [name] MRN: [id number] DOB: 05-Oct-1930 Today's Date: 11/13/2020   History of Present Illness  87 y.o. female with a hx of NICM, BiV ICD, atrial fib persistent, high grade AV block with PPM, on amiodarone, hyperthyroidism, anemia and lower ext edema, CKD 3-4  who is being seen for the evaluation of acute on chronic diastolic heart failure with right ventricular dysfunction and PAF  Clinical Impression  Pt participated in session with increased time needed to follow commands. Pt with increased concern regarding O2 throughout session, lowest SpO2 90% following ambulation. Pt I prior to admission with use of rollator. Pt states she does household chores for husband and has an aide come in at least 3.5 hours daily to assist with husband and can use them for her as well. Pt requiring min guard during ambulation and increased cueing for hand placement for safety. Pt with balance and strength deficits noted during transfer. Pt will benefit from skilled PT to address deficits to maximize independence with functional moiblity prior to discharge.     Follow Up Recommendations Home health PT;Supervision - Intermittent    Equipment Recommendations  None recommended by PT    Recommendations for Other Services       Precautions / Restrictions Precautions Precautions: Fall Restrictions Weight Bearing Restrictions: No      Mobility  Bed Mobility Overal bed mobility: Modified Independent             General bed mobility comments: HOB elevated    Transfers Overall transfer level: Needs assistance   Transfers: Sit to/from Stand Sit to Stand: Min guard         General transfer comment: verbal cueing for hand placement on RW  Ambulation/Gait Ambulation/Gait assistance: Min guard Gait Distance (Feet): 100 Feet Assistive device: Rolling walker (2 wheeled) Gait Pattern/deviations: Trunk flexed;Step-through pattern;Decreased stride  length Gait velocity: decreased velocity      Stairs            Wheelchair Mobility    Modified Rankin (Stroke Patients Only)       Balance Overall balance assessment: Needs assistance Sitting-balance support: Feet supported Sitting balance-Leahy Scale: Fair     Standing balance support: Single extremity supported Standing balance-Leahy Scale: Poor                               Pertinent Vitals/Pain Pain Assessment: No/denies pain    Home Living Family/patient expects to be discharged to:: Private residence Living Arrangements: Spouse/significant other Available Help at Discharge: Friend(s);Available PRN/intermittently;Other (Comment) (pt's husband has help during the day wtih bathing and dressing; 3.5 hours everyday and says they can also help her) Type of Home: House Home Access: Level entry     Home Layout: One level Home Equipment: Walker - 4 wheels;Shower seat;Bedside commode;Grab bars - tub/shower;Grab bars - toilet      Prior Function Level of Independence: Independent with assistive device(s)         Comments: pt does household chores     Hand Dominance   Dominant Hand: Right    Extremity/Trunk Assessment   Upper Extremity Assessment Upper Extremity Assessment: Overall WFL for tasks assessed    Lower Extremity Assessment Lower Extremity Assessment: Generalized weakness       Communication   Communication: HOH  Cognition Arousal/Alertness: Awake/alert Behavior During Therapy: WFL for tasks assessed/performed Overall Cognitive Status: Within Functional Limits for tasks assessed  General Comments  VSS throughout session off O2    Exercises     Assessment/Plan    PT Assessment Patient needs continued PT services  PT Problem List Decreased strength;Decreased mobility;Decreased coordination;Decreased activity tolerance;Decreased balance       PT Treatment  Interventions Therapeutic exercise;DME instruction;Gait training;Balance training;Patient/family education    PT Goals (Current goals can be found in the Care Plan section)  Acute Rehab PT Goals Patient Stated Goal: I dont want to have to use oxygen PT Goal Formulation: With patient Time For Goal Achievement: 11/27/20 Potential to Achieve Goals: Good    Frequency Min 3X/week   Barriers to discharge        Co-evaluation               AM-PAC PT "6 Clicks" Mobility  Outcome Measure Help needed turning from your back to your side while in a flat bed without using bedrails?: None Help needed moving from lying on your back to sitting on the side of a flat bed without using bedrails?: A Little Help needed moving to and from a bed to a chair (including a wheelchair)?: A Little Help needed standing up from a chair using your arms (e.g., wheelchair or bedside chair)?: A Little Help needed to walk in hospital room?: A Little Help needed climbing 3-5 steps with a railing? : A Lot 6 Click Score: 18    End of Session Equipment Utilized During Treatment: Gait belt Activity Tolerance: Patient tolerated treatment well Patient left: in chair;with call bell/phone within reach Nurse Communication: Mobility status PT Visit Diagnosis: Unsteadiness on feet (R26.81);Muscle weakness (generalized) (M62.81);Difficulty in walking, not elsewhere classified (R26.2)    Time: 2458-0998 PT Time Calculation (min) (ACUTE ONLY): 32 min   Charges:   PT Evaluation $PT Eval Low Complexity: 1 Low PT Treatments $Therapeutic Activity: 8-22 mins       Lyanne Co, DPT Acute Rehabilitation Services [phone]  [place] 11/13/2020, 11:57 AM

## 2020-11-14 DIAGNOSIS — I5043 Acute on chronic combined systolic (congestive) and diastolic (congestive) heart failure: Secondary | ICD-10-CM | POA: Diagnosis not present

## 2020-11-14 DIAGNOSIS — N1832 Chronic kidney disease, stage 3b: Secondary | ICD-10-CM | POA: Diagnosis not present

## 2020-11-14 LAB — CBC
HCT: 36.7 % (ref 36.0–46.0)
Hemoglobin: 11.1 g/dL — ABNORMAL LOW (ref 12.0–15.0)
MCH: 29.1 pg (ref 26.0–34.0)
MCHC: 30.2 g/dL (ref 30.0–36.0)
MCV: 96.3 fL (ref 80.0–100.0)
Platelets: 132 10*3/uL — ABNORMAL LOW (ref 150–400)
RBC: 3.81 MIL/uL — ABNORMAL LOW (ref 3.87–5.11)
RDW: 16.8 % — ABNORMAL HIGH (ref 11.5–15.5)
WBC: 5.7 10*3/uL (ref 4.0–10.5)
nRBC: 0 % (ref 0.0–0.2)

## 2020-11-14 LAB — BASIC METABOLIC PANEL
Anion gap: 12 (ref 5–15)
BUN: 39 mg/dL — ABNORMAL HIGH (ref 8–23)
CO2: 24 mmol/L (ref 22–32)
Calcium: 9.1 mg/dL (ref 8.9–10.3)
Chloride: 105 mmol/L (ref 98–111)
Creatinine, Ser: 2.09 mg/dL — ABNORMAL HIGH (ref 0.44–1.00)
GFR, Estimated: 22 mL/min — ABNORMAL LOW (ref >60)
Glucose, Bld: 87 mg/dL (ref 70–99)
Potassium: 4.7 mmol/L (ref 3.5–5.1)
Sodium: 141 mmol/L (ref 135–145)

## 2020-11-14 MED ORDER — FUROSEMIDE 10 MG/ML IJ SOLN
80.0000 mg | Freq: Once | INTRAMUSCULAR | Status: AC
  Administered 2020-11-14: 80 mg via INTRAVENOUS
  Filled 2020-11-14: qty 8

## 2020-11-14 MED ORDER — FUROSEMIDE 10 MG/ML IJ SOLN
80.0000 mg | Freq: Two times a day (BID) | INTRAMUSCULAR | Status: DC
  Administered 2020-11-14 – 2020-11-17 (×6): 80 mg via INTRAVENOUS
  Filled 2020-11-14 (×6): qty 8

## 2020-11-14 NOTE — Progress Notes (Signed)
Pt was standing at sink bathing and began to feel like she was going to pass out. Had been sit down in chair. After few min of sitting sats were 95% on RA, unsure what sats were originally because pt did not have sat probe on while bathing. BP would not result while sitting in chair. Pt placed back in bed and BP 101/68 HR 99. Dr. Harrington Challenger in room. New orders given. Stated to hold metoprolol and give IV lasix. Cont to monitor. Carroll Kinds RN

## 2020-11-14 NOTE — Care Management Important Message (Signed)
Important Message  Patient Details  Name: [name] MRN: [id number] Date of Birth: 1930/07/30   Medicare Important Message Given:  Yes     [name] 11/14/2020, 9:21 AM

## 2020-11-14 NOTE — Progress Notes (Addendum)
Progress Note  [name] Date of Encounter: 11/14/2020  [facility] Cardiologist: Virl Axe, MD   Subjective   Upon entering the room, [place] was off for breakfast and O2 sat was 78% on room air. Applied [place] and she recovered to 92%. She continues to elevate HOB, but this is normal for her. Continues to have LE edema. PPM interrogation with increased thoracic impedence  Inpatient Medications    Scheduled Meds: . ALPRAZolam  0.5 mg Oral QHS  . amiodarone  200 mg Oral BID  . busPIRone  5 mg Oral BID  . metoprolol tartrate  12.5 mg Oral BID  . potassium chloride  20 mEq Oral BID  . Rivaroxaban  15 mg Oral Q supper  . sodium chloride flush  3 mL Intravenous Q12H   Continuous Infusions: . sodium chloride     PRN Meds: sodium chloride, acetaminophen, ondansetron (ZOFRAN) IV, sodium chloride flush   Vital Signs    Vitals:   11/13/20 0428 11/13/20 1832 11/13/20 2144 11/14/20 0404  BP: 111/77 111/70 114/77   Pulse: 94 60 62 90  Resp: 18 18 18    Temp: (!) 97.3 F (36.3 C) (!) 97.3 F (36.3 C) (!) 97.4 F (36.3 C)   TempSrc: Oral Axillary Oral   SpO2: 94% 99% 97% (!) 89%  Weight:    67.3 kg  Height:        Intake/Output Summary (Last 24 hours) at 11/14/2020 0801 Last data filed at 11/14/2020 0000 Gross per 24 hour  Intake 720 ml  Output 1000 ml  Net -280 ml   Last 3 Weights 11/14/2020 11/12/2020 11/11/2020  Weight (lbs) 148 lb 4.8 oz 148 lb 9.4 oz 148 lb 9.6 oz  Weight (kg) 67.268 kg 67.4 kg 67.405 kg      Telemetry    A-paced, V-paced, some episodes of A-sense, V-pace - Personally Reviewed  ECG    No new tracings - Personally Reviewed  Physical Exam   GEN: elderly female in no acute distress.   Neck: + JVD Cardiac: RRR, no murmurs, rubs, or gallops.  Respiratory: crackles in bases GI: Soft, nontender, non-distended  MS: 2 + B LE edema Neuro:  Nonfocal  Psych: Normal affect   Labs    High Sensitivity Troponin:  No results for input(s):  TROPONINIHS in the last 720 hours.    Chemistry Recent Labs  Lab 11/08/20 0155 11/09/20 0142 11/11/20 0233 11/12/20 0213 11/14/20 0431  NA 140   < > 143 141 141  K 4.1   < > 4.3 4.7 4.7  CL 102   < > 104 103 105  CO2 29   < > 28 27 24   GLUCOSE 109*   < > 102* 122* 87  BUN 46*   < > 44* 40* 39*  CREATININE 2.33*   < > 2.15* 2.02* 2.09*  CALCIUM 9.2   < > 9.4 9.4 9.1  PROT 5.8*  --   --   --   --   ALBUMIN 2.8*  --   --   --   --   AST 31  --   --   --   --   ALT 30  --   --   --   --   ALKPHOS 79  --   --   --   --   BILITOT 1.8*  --   --   --   --   GFRNONAA 20*   < > 22*  23* 22*  ANIONGAP 9   < > 11 11 12    < > = values in this interval not displayed.     Hematology Recent Labs  Lab 11/10/20 0355 11/11/20 0233 11/14/20 0431  WBC 3.8* 4.2 5.7  RBC 3.72* 3.83* 3.81*  HGB 10.5* 10.8* 11.1*  HCT 35.9* 37.3 36.7  MCV 96.5 97.4 96.3  MCH 28.2 28.2 29.1  MCHC 29.2* 29.0* 30.2  RDW 16.0* 16.5* 16.8*  PLT 120* 126* 132*    BNPNo results for input(s): BNP, PROBNP in the last 168 hours.   DDimer No results for input(s): DDIMER in the last 168 hours.   Radiology    No results found.  Cardiac Studies   Echocardiogram 11/07/2020: 1. Left ventricular ejection fraction, by estimation, is 50 to 55%. The  left ventricle has low normal function. The left ventricle has no regional  wall motion abnormalities. There is mild left ventricular hypertrophy.  Left ventricular diastolic  parameters are indeterminate.  2. Right ventricular systolic function is moderately reduced. The right  ventricular size is moderately enlarged. There is mildly elevated  pulmonary artery systolic pressure. The estimated right ventricular  systolic pressure is 46.5 mmHg.  3. Right atrial size was severely dilated.  4. The mitral valve is degenerative. Moderate mitral annular  calcification. No evidence of mitral stenosis. Moderate mitral valve  regurgitation. There is significant  prolapse of the anterior leaflet with  posterior directed MR jet that appears moderate  but may be underestimating as eccentric jet and not well visualized on  apical views. Could consider TEE or cardiac MRI for further evaluation  5. Tricuspid valve regurgitation is moderate.  6. The aortic valve is tricuspid. Aortic valve regurgitation is not  visualized. Mild to moderate aortic valve sclerosis/calcification is  present, without any evidence of aortic stenosis.  7. The inferior vena cava is dilated in size with <50% respiratory  variability, suggesting right atrial pressure of 15 mmHg.   Patient Profile     87 y.o. 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 Of note, PCP noted sCr increased to 1.68 and stopped lasix due to concern for dehydration.  Assessment & Plan    Acute on chronic diastolic heart failure Hx of systolic heart failure (EF 25-30%) Right ventricular dysfunction Moderate pulmonary hypertension - EF 25-30% in April 2021 felt due to 100% pacing --> repeat echo with normalized EF after adjustments in PPM - last month, PCP suspected over-diuresis and stopped lasix --> presented with lower extremity edema, anasarca, DOE, and orthopnea - repeated IV lasix yesterday for increased SOB yesterday morning - she is diuresing on 80 mg IV lasix x 1 dose - overall net negative 2.7 L with 1L urine output yesterday - coreg switched to metoprolol 12.5 mg BID - she continues to be volume up - I will resume 80 mg IV lasix BID, check BMP and BNP in the AM   PAF Chronic anticoagulation - amiodarone has been increased to 200 mg BID - was on 100 mg daily at home - continue 15 mg xarelto - no bleeding  - device interrogation shows no Afib since  11/08/20, but 5.4% 12/23-12/30 - would opt to continue on higher dose of amiodarone for now   Moderate MR - monitor with  echo in 12 months   SSS s/p PPM - device interrogated - 100% V pacing - optivol also shows fluid overload   CKD stage III - IV - sCr 2.09 (2.02), K 4.7     For questions or updates, please contact [facility] Please consult [email or web address] for contact info under        Signed, Ledora Bottcher, PA  11/14/2020, 8:01 AM    Patient seen and examined   I agree with findings of A [facility] above When I examined patient seh was feeling good   Brushing teeth while standing at sink Lungs relatively clear Cardiac RRR  No S3  Ext without edema  Soon after leaving room nursing called  Pt dizzy, weak   Sats OK (even on RA), BP OK Taken to bed  Plan :   Continue to follow    Give addiontal lasix as sats marginal at times  Check orthostatics later     Follow  No plans for d/c today    Dorris Carnes MD

## 2020-11-14 NOTE — Progress Notes (Signed)
Progress Note    [name]  BOF:[id number] DOB: [birth year]  DOA: 11/06/2020 PCP: Chesley Noon, MD    Brief Narrative:    Medical records reviewed and are as summarized below:  [name] is an 87 y.o. female with medical history significant of A.Fib on amiodarone and Xarelto, HTN, SSS s/p PPM.  CHF, CKD. Pt with h/o dCHF previously, echo in April 2021 showed newly reduced EF 25-30%, cards suspected this possibly due to her being paced 100% of the time.  Looks like adjustments were made, repeat echo in Sept 2021 showed normal EF. Last month her PCP noted that creat had increased to 1.68, was concerned that she was dehydrated and so stopped her lasix. Since that time she has had worsening edema now progressed to anasarca, SOB, DOE, orthopnea. Seen in cards office and had labs drawn last week on 12/23.  Cards put her on lasix 80mg  daily for 3 days then back to 60mg  daily.  Slow to improve with treatment of presumed UTI and adjustment of rate controlling meds and diuretics   Assessment/Plan:   Principal Problem:   CKD (chronic kidney disease) stage 3, GFR 30-59 ml/min (HCC) Active Problems:   Hypertension   Sick sinus syndrome (HCC)   Paroxysmal atrial fibrillation (HCC)   Biventricular cardiac pacemaker in situ   Acute on chronic combined systolic and diastolic CHF (congestive heart failure) (HCC)   AKI (acute kidney injury) ([place])   Dyspnea   Tricuspid valve regurgitation    Acute on chronic diastolic congestive heart failure: Cardiology on board.  Patient still appears volume overloaded.  They have started her on Lasix IV 80 mg twice daily.  Patient still complains of some shortness of breath.  Appreciate cardiology help.  Fluid balance net negative about 2.7 L so far.  AKI on CKD 3b -creatinine has remained fairly stable around 2.1 since last 3 days.  Monitor closely.  SSS and biventricular PPM -  -follows with Dr. Caryl Comes  HTN -Controlled.  Continue low-dose  metoprolol.  PAF -remains on amiodarone 200 mg p.o. twice daily as well as Xarelto.  Managed by cardiology.  ? UTI -present on admission U/A -dose of IV rocephin x3 days- completed 1/4   Family Communication/Anticipated D/C date and plan/Code Status   DVT prophylaxis: xarelto Code Status: Full Code.  Disposition Plan: Status is: inpt LM for daughter 1/3 The patient will require care spanning > 2 midnights and should be moved to inpatient because: Inpatient level of care appropriate due to severity of illness  Dispo: The patient is from: Home              Anticipated d/c is to: Home              Anticipated d/c date is: 1 to 2 days              Patient currently is not medically stable to d/c.           Medical Consultants:    cards     Subjective:   Seen and examined.  Feels better but is still with shortness of breath with activity.  No other complaint.  Objective:    Vitals:   11/13/20 0428 11/13/20 1832 11/13/20 2144 11/14/20 0404  BP: 111/77 111/70 114/77   Pulse: 94 60 62 90  Resp: 18 18 18    Temp: (!) 97.3 F (36.3 C) (!) 97.3 F (36.3 C) (!) 97.4 F (36.3 C)  TempSrc: Oral Axillary Oral   SpO2: 94% 99% 97% (!) 89%  Weight:    67.3 kg  Height:        Intake/Output Summary (Last 24 hours) at 11/14/2020 1301 Last data filed at 11/14/2020 0000 Gross per 24 hour  Intake 720 ml  Output 1000 ml  Net -280 ml   Filed Weights   11/11/20 0645 11/12/20 0624 11/14/20 0404  Weight: 67.4 kg 67.4 kg 67.3 kg    Exam:  General exam: Appears calm and comfortable  Respiratory system: Crackles at the bases bilaterally. Respiratory effort normal. Cardiovascular system: S1 & S2 heard, RRR. No JVD, murmurs, rubs, gallops or clicks. No pedal edema. Gastrointestinal system: Abdomen is nondistended, soft and nontender. No organomegaly or masses felt. Normal bowel sounds heard. Central nervous system: Alert and oriented. No focal neurological  deficits. Extremities: Symmetric 5 x 5 power. Skin: No rashes, lesions or ulcers.  Psychiatry: Judgement and insight appear normal. Mood & affect appropriate.   Data Reviewed:   I have personally reviewed following labs and imaging studies:  Labs: Labs show the following:   Basic Metabolic Panel: Recent Labs  Lab 11/09/20 0142 11/10/20 0355 11/11/20 0233 11/12/20 0213 11/14/20 0431  NA 139 142 143 141 141  K 4.2 4.1 4.3 4.7 4.7  CL 105 105 104 103 105  CO2 27 27 28 27 24   GLUCOSE 103* 101* 102* 122* 87  BUN 44* 43* 44* 40* 39*  CREATININE 1.93* 1.87* 2.15* 2.02* 2.09*  CALCIUM 9.0 9.3 9.4 9.4 9.1  MG  --   --   --  1.9  --    GFR Estimated Creatinine Clearance: 16.7 mL/min (A) (by C-G formula based on SCr of 2.09 mg/dL (H)). Liver Function Tests: Recent Labs  Lab 11/08/20 0155  AST 31  ALT 30  ALKPHOS 79  BILITOT 1.8*  PROT 5.8*  ALBUMIN 2.8*   No results for input(s): LIPASE, AMYLASE in the last 168 hours. No results for input(s): AMMONIA in the last 168 hours. Coagulation profile No results for input(s): INR, PROTIME in the last 168 hours.  CBC: Recent Labs  Lab 11/08/20 0155 11/09/20 0142 11/10/20 0355 11/11/20 0233 11/14/20 0431  WBC 4.6 3.8* 3.8* 4.2 5.7  HGB 10.1* 10.4* 10.5* 10.8* 11.1*  HCT 35.4* 35.2* 35.9* 37.3 36.7  MCV 98.1 97.0 96.5 97.4 96.3  PLT 123* 127* 120* 126* 132*   Cardiac Enzymes: No results for input(s): CKTOTAL, CKMB, CKMBINDEX, TROPONINI in the last 168 hours. BNP (last 3 results) Recent Labs    03/02/20 0930  PROBNP 3,278*   CBG: No results for input(s): GLUCAP in the last 168 hours. D-Dimer: No results for input(s): DDIMER in the last 72 hours. Hgb A1c: No results for input(s): HGBA1C in the last 72 hours. Lipid Profile: No results for input(s): CHOL, HDL, LDLCALC, TRIG, CHOLHDL, LDLDIRECT in the last 72 hours. Thyroid function studies: No results for input(s): TSH, T4TOTAL, T3FREE, THYROIDAB in the last 72  hours.  Invalid input(s): FREET3 Anemia work up: No results for input(s): VITAMINB12, FOLATE, FERRITIN, TIBC, IRON, RETICCTPCT in the last 72 hours. Sepsis Labs: Recent Labs  Lab 11/09/20 0142 11/10/20 0355 11/11/20 0233 11/14/20 0431  WBC 3.8* 3.8* 4.2 5.7    Microbiology Recent Results (from the past 240 hour(s))  SARS CORONAVIRUS 2 (TAT 6-24 HRS) Nasopharyngeal Nasopharyngeal Swab     Status: None   Collection Time: 11/06/20  8:27 PM   Specimen: Nasopharyngeal Swab  Result Value Ref Range  Status   SARS Coronavirus 2 NEGATIVE NEGATIVE Final    Comment: (NOTE) SARS-CoV-2 target nucleic acids are NOT DETECTED.  The SARS-CoV-2 RNA is generally detectable in upper and lower respiratory specimens during the acute phase of infection. Negative results do not preclude SARS-CoV-2 infection, do not rule out co-infections with other pathogens, and should not be used as the sole basis for treatment or other patient management decisions. Negative results must be combined with clinical observations, patient history, and epidemiological information. The expected result is Negative.  Fact Sheet for Patients: SugarRoll.be  Fact Sheet for Healthcare Providers: https://www.woods-mathews.com/  This test is not yet approved or cleared by the [place] FDA and  has been authorized for detection and/or diagnosis of SARS-CoV-2 by FDA under an Emergency Use Authorization (EUA). This EUA will remain  in effect (meaning this test can be used) for the duration of the COVID-19 declaration under Se ction 564(b)(1) of the Act, 21 U.S.C. section 360bbb-3(b)(1), unless the authorization is terminated or revoked sooner.  Performed at [facility], [place] [address] [place], [place] [address]     Procedures and diagnostic studies:  No results found.  Medications:   . ALPRAZolam  0.5 mg Oral QHS  . amiodarone  200 mg Oral BID  . busPIRone  5  mg Oral BID  . furosemide  80 mg Intravenous BID  . metoprolol tartrate  12.5 mg Oral BID  . potassium chloride  20 mEq Oral BID  . Rivaroxaban  15 mg Oral Q supper  . sodium chloride flush  3 mL Intravenous Q12H   Continuous Infusions: . sodium chloride       LOS: 6 days   Darliss Cheney  [facility]   How to contact the [facility] Attending or Consulting provider [place] or covering provider during after hours [place], for this patient?  1. Check the care team in [facility] and look for a) attending/consulting [facility] provider listed and b) the [facility] team listed 2. Log into [email or web address] and use [place]'s universal password to access. If you do not have the password, please contact the hospital operator. 3. Locate the [facility] provider you are looking for under [facility] and page to a number that you can be directly reached. 4. If you still have difficulty reaching the provider, please page the [facility] (Director on Call) for the Hospitalists listed on amion for assistance.  11/14/2020, 1:01 PM

## 2020-11-15 ENCOUNTER — Inpatient Hospital Stay (HOSPITAL_COMMUNITY): Payer: Medicare Other

## 2020-11-15 DIAGNOSIS — I5021 Acute systolic (congestive) heart failure: Secondary | ICD-10-CM

## 2020-11-15 DIAGNOSIS — I5043 Acute on chronic combined systolic (congestive) and diastolic (congestive) heart failure: Secondary | ICD-10-CM | POA: Diagnosis not present

## 2020-11-15 DIAGNOSIS — N1832 Chronic kidney disease, stage 3b: Secondary | ICD-10-CM | POA: Diagnosis not present

## 2020-11-15 LAB — CBC WITH DIFFERENTIAL/PLATELET
Abs Immature Granulocytes: 0.02 10*3/uL (ref 0.00–0.07)
Basophils Absolute: 0 10*3/uL (ref 0.0–0.1)
Basophils Relative: 1 %
Eosinophils Absolute: 0.1 10*3/uL (ref 0.0–0.5)
Eosinophils Relative: 1 %
HCT: 37.6 % (ref 36.0–46.0)
Hemoglobin: 10.9 g/dL — ABNORMAL LOW (ref 12.0–15.0)
Immature Granulocytes: 0 %
Lymphocytes Relative: 16 %
Lymphs Abs: 0.7 10*3/uL (ref 0.7–4.0)
MCH: 28.2 pg (ref 26.0–34.0)
MCHC: 29 g/dL — ABNORMAL LOW (ref 30.0–36.0)
MCV: 97.4 fL (ref 80.0–100.0)
Monocytes Absolute: 0.4 10*3/uL (ref 0.1–1.0)
Monocytes Relative: 9 %
Neutro Abs: 3.4 10*3/uL (ref 1.7–7.7)
Neutrophils Relative %: 73 %
Platelets: 139 10*3/uL — ABNORMAL LOW (ref 150–400)
RBC: 3.86 MIL/uL — ABNORMAL LOW (ref 3.87–5.11)
RDW: 16.7 % — ABNORMAL HIGH (ref 11.5–15.5)
WBC: 4.6 10*3/uL (ref 4.0–10.5)
nRBC: 0 % (ref 0.0–0.2)

## 2020-11-15 LAB — BASIC METABOLIC PANEL
Anion gap: 11 (ref 5–15)
BUN: 40 mg/dL — ABNORMAL HIGH (ref 8–23)
CO2: 27 mmol/L (ref 22–32)
Calcium: 9.1 mg/dL (ref 8.9–10.3)
Chloride: 101 mmol/L (ref 98–111)
Creatinine, Ser: 2.18 mg/dL — ABNORMAL HIGH (ref 0.44–1.00)
GFR, Estimated: 21 mL/min — ABNORMAL LOW (ref >60)
Glucose, Bld: 141 mg/dL — ABNORMAL HIGH (ref 70–99)
Potassium: 4 mmol/L (ref 3.5–5.1)
Sodium: 139 mmol/L (ref 135–145)

## 2020-11-15 LAB — ECHOCARDIOGRAM LIMITED
Height: 62 in
MV M vel: 4.5 m/s
MV Peak grad: 81 mmHg
Radius: 0.6 cm
Weight: 2372.8 oz

## 2020-11-15 LAB — MAGNESIUM: Magnesium: 1.8 mg/dL (ref 1.7–2.4)

## 2020-11-15 NOTE — Progress Notes (Addendum)
Progress Note  [name] Date of Encounter: 11/15/2020  [facility] Cardiologist: Virl Axe, MD   Subjective   Sitting upright in bed with O2 sats in the low 80s off O2 via [place]. Improved to mid 90s with O2 via [place] in place. She has no complaints this morning. Hasn't been out of bed. Denies SOB at rest. No complaints of chest pain or palpitaitons.  Inpatient Medications    Scheduled Meds: . ALPRAZolam  0.5 mg Oral QHS  . amiodarone  200 mg Oral BID  . busPIRone  5 mg Oral BID  . furosemide  80 mg Intravenous BID  . metoprolol tartrate  12.5 mg Oral BID  . potassium chloride  20 mEq Oral BID  . Rivaroxaban  15 mg Oral Q supper  . sodium chloride flush  3 mL Intravenous Q12H   Continuous Infusions: . sodium chloride     PRN Meds: sodium chloride, acetaminophen, ondansetron (ZOFRAN) IV, sodium chloride flush   Vital Signs    Vitals:   11/14/20 0404 11/14/20 1443 11/14/20 2031 11/15/20 0608  BP:  (!) 90/59 105/75 102/73  Pulse: 90 60 81 96  Resp:  18 18 16   Temp:  (!) 97.5 F (36.4 C) 97.8 F (36.6 C) 98.4 F (36.9 C)  TempSrc:  Oral Oral Oral  SpO2: (!) 89% 97% 99% 97%  Weight: 67.3 kg   67.3 kg  Height:        Intake/Output Summary (Last 24 hours) at 11/15/2020 0717 Last data filed at 11/15/2020 8416 Gross per 24 hour  Intake --  Output 1000 ml  Net -1000 ml   Last 3 Weights 11/15/2020 11/14/2020 11/12/2020  Weight (lbs) 148 lb 4.8 oz 148 lb 4.8 oz 148 lb 9.4 oz  Weight (kg) 67.268 kg 67.268 kg 67.4 kg      Telemetry    AV paced with A-sense, V-paced - Personally Reviewed  ECG    No new tracings - Personally Reviewed  Physical Exam   GEN: No acute distress.   Neck: No JVD Cardiac: RRR, no murmurs, rubs, or gallops.  Respiratory: Slight inspiratory wheeze with crackles at lung bases L>R. GI: Soft, nontender, non-distended  MS: 1+ LE edema; No deformity. Neuro:  Nonfocal  Psych: Normal affect   Labs    High Sensitivity Troponin:   No results for input(s): TROPONINIHS in the last 720 hours.    Chemistry Recent Labs  Lab 11/11/20 0233 11/12/20 0213 11/14/20 0431  NA 143 141 141  K 4.3 4.7 4.7  CL 104 103 105  CO2 28 27 24   GLUCOSE 102* 122* 87  BUN 44* 40* 39*  CREATININE 2.15* 2.02* 2.09*  CALCIUM 9.4 9.4 9.1  GFRNONAA 22* 23* 22*  ANIONGAP 11 11 12      Hematology Recent Labs  Lab 11/10/20 0355 11/11/20 0233 11/14/20 0431  WBC 3.8* 4.2 5.7  RBC 3.72* 3.83* 3.81*  HGB 10.5* 10.8* 11.1*  HCT 35.9* 37.3 36.7  MCV 96.5 97.4 96.3  MCH 28.2 28.2 29.1  MCHC 29.2* 29.0* 30.2  RDW 16.0* 16.5* 16.8*  PLT 120* 126* 132*    BNPNo results for input(s): BNP, PROBNP in the last 168 hours.   DDimer No results for input(s): DDIMER in the last 168 hours.   Radiology    No results found.  Cardiac Studies   Echocardiogram 11/07/2020: 1. Left ventricular ejection fraction, by estimation, is 50 to 55%. The  left ventricle has low normal function. The  left ventricle has no regional  wall motion abnormalities. There is mild left ventricular hypertrophy.  Left ventricular diastolic  parameters are indeterminate.  2. Right ventricular systolic function is moderately reduced. The right  ventricular size is moderately enlarged. There is mildly elevated  pulmonary artery systolic pressure. The estimated right ventricular  systolic pressure is 16.1 mmHg.  3. Right atrial size was severely dilated.  4. The mitral valve is degenerative. Moderate mitral annular  calcification. No evidence of mitral stenosis. Moderate mitral valve  regurgitation. There is significant prolapse of the anterior leaflet with  posterior directed MR jet that appears moderate  but may be underestimating as eccentric jet and not well visualized on  apical views. Could consider TEE or cardiac MRI for further evaluation  5. Tricuspid valve regurgitation is moderate.  6. The aortic valve is tricuspid. Aortic valve regurgitation is  not  visualized. Mild to moderate aortic valve sclerosis/calcification is  present, without any evidence of aortic stenosis.  7. The inferior vena cava is dilated in size with <50% respiratory  variability, suggesting right atrial pressure of 15 mmHg.   Patient Profile     87 y.o. 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 Of note, PCP noted sCr increased to 1.68 and stopped lasix due to concern for dehydration.  Assessment & Plan    1. Acute on chronic combined CHF: patient presented with LE edema, DOE, and orthopnea in the setting of discontinuation of her lasix by PCP 1 month ago. She has a history of NICM with EF 25-30% 02/2020 in the setting of V-pacing - PPM settings were adjusted and LVEF normalized. RV function however is down  She has been diuresing with IV lasix 80mg  BID with UOP -1030mL in the past 24 hours with -3.7L this admission, though no intake documented. Weight appears stagnant at 148lbs over the past several days, down from peak 153lbs this admission. She was transitioned from carvedilol to metoprolol due to soft BPs - Continue IV lasix 80mg  BID - Continue metoprolol 12.5mg  BID - Continue to monitor strict I&Os and daily weights - Continue to monitor electrolytes closely and replete as needed to maintain K >4, Mg >2  2. Paroxysmal atrial fibrillation: device interrogation shows no Afib since 11/08/20 though did have a 5.4% burden 12/23-12/30. Her amiodarone was increased from 100mg  daily to 200mg  BID - Continue metoprolol for rate control - Continue amiodarone for rhythm control - Continue xarelto for stroke ppx  3. SSS s/p PPM: device interrogated this admission with 100% V-pacing. Optivol showed fluid overload - Continue routine  outpatient monitoring  4. Mitral regurgitation: moderate on echo this admission.  - Continue routine annual outpatient monitoring  5. CKD stage III-IV: Cr. 2.09 yesterday, up slightly from 1.9 on admission       For questions or updates, please contact [place] Please consult [email or web address] for contact info under        Signed, Abigail Butts, PA-C  11/15/2020, 7:17 AM    Patient seen and examined   I agree with findings as noteed above   Pt this am appears worse than yesterday  Sats dropped   ON exam:  Lungs with rales at bases Cardiac RRR  No S3 Ext with 1+ edema  Pacing (AV then  V paced)  Recommm  Will diurese today  I have just reviewed echo from 12/31   Her RV is dilated, function depressed.   Very differnet from echo in September    Will get limited to reeval now that diuresed some     Repeat CXR  Initial Xray with effusions   Dorris Carnes MD

## 2020-11-15 NOTE — Progress Notes (Signed)
Progress Note    [name]  PIR:[id number] DOB: 09-Feb-1930  DOA: 11/06/2020 PCP: Chesley Noon, MD    Brief Narrative:    Medical records reviewed and are as summarized below:  [name] is an 87 y.o. female with medical history significant of A.Fib on amiodarone and Xarelto, HTN, SSS s/p PPM.  CHF, CKD. Pt with h/o dCHF previously, echo in April 2021 showed newly reduced EF 25-30%, cards suspected this possibly due to her being paced 100% of the time.  Looks like adjustments were made, repeat echo in Sept 2021 showed normal EF. Last month her PCP noted that creat had increased to 1.68, was concerned that she was dehydrated and so stopped her lasix. Since that time she has had worsening edema now progressed to anasarca, SOB, DOE, orthopnea. Seen in cards office and had labs drawn last week on 12/23.  Cards put her on lasix 80mg  daily for 3 days then back to 60mg  daily.  Slow to improve with treatment of presumed UTI and adjustment of rate controlling meds and diuretics   Assessment/Plan:   Principal Problem:   CKD (chronic kidney disease) stage 3, GFR 30-59 ml/min (HCC) Active Problems:   Hypertension   Sick sinus syndrome (HCC)   Paroxysmal atrial fibrillation (HCC)   Biventricular cardiac pacemaker in situ   Acute on chronic combined systolic and diastolic CHF (congestive heart failure) (HCC)   AKI (acute kidney injury) ([place])   Dyspnea   Tricuspid valve regurgitation    Acute on chronic diastolic congestive heart failure: Cardiology on board.  Patient still appears volume overloaded however improved compared to yesterday.  She remains on Lasix IV 80 mg twice daily.  Patient still complains of some shortness of breath.  Appreciate cardiology help.  Fluid balance net negative about 3.7 L so far.  AKI on CKD 3b -creatinine has remained fairly stable around 2.1 since last 4-5 days.  Monitor closely.  SSS and biventricular PPM -  -follows with Dr. Caryl Comes  HTN  -Controlled.  Continue low-dose metoprolol.  PAF -remains on amiodarone 200 mg p.o. twice daily as well as Xarelto.  Managed by cardiology.  ? UTI -present on admission U/A -dose of IV rocephin x3 days- completed 1/4   Family Communication/Anticipated D/C date and plan/Code Status   DVT prophylaxis: xarelto Code Status: Full Code.  Disposition Plan: Status is: inpt LM for daughter 1/3 The patient will require care spanning > 2 midnights and should be moved to inpatient because: Inpatient level of care appropriate due to severity of illness  Dispo: The patient is from: Home              Anticipated d/c is to: Home              Anticipated d/c date is: 1 to 2 days              Patient currently is not medically stable to d/c.           Medical Consultants:    cards     Subjective:  Seen and examined.  Still complains of shortness of breath but improving compared to yesterday.  No other complaint.  Objective:    Vitals:   11/14/20 0404 11/14/20 1443 11/14/20 2031 11/15/20 0608  BP:  (!) 90/59 105/75 102/73  Pulse: 90 60 81 96  Resp:  18 18 16   Temp:  (!) 97.5 F (36.4 C) 97.8 F (36.6 C) 98.4 F (36.9 C)  TempSrc:  Oral Oral Oral  SpO2: (!) 89% 97% 99% 97%  Weight: 67.3 kg   67.3 kg  Height:        Intake/Output Summary (Last 24 hours) at 11/15/2020 1243 Last data filed at 11/15/2020 0614 Gross per 24 hour  Intake --  Output 1000 ml  Net -1000 ml   Filed Weights   11/12/20 0624 11/14/20 0404 11/15/20 0608  Weight: 67.4 kg 67.3 kg 67.3 kg    Exam:  General exam: Appears calm and comfortable  Respiratory system: Crackles at the bases bilaterally. Respiratory effort normal. Cardiovascular system: S1 & S2 heard, irregularly irregular rate and rhythm. No JVD, murmurs, rubs, gallops or clicks. No pedal edema. Gastrointestinal system: Abdomen is nondistended, soft and nontender. No organomegaly or masses felt. Normal bowel sounds heard. Central nervous  system: Alert and oriented. No focal neurological deficits. Extremities: Symmetric 5 x 5 power. Skin: No rashes, lesions or ulcers.  Psychiatry: Judgement and insight appear normal. Mood & affect appropriate.   Data Reviewed:   I have personally reviewed following labs and imaging studies:  Labs: Labs show the following:   Basic Metabolic Panel: Recent Labs  Lab 11/10/20 0355 11/11/20 0233 11/12/20 0213 11/14/20 0431 11/15/20 0912  NA 142 143 141 141 139  K 4.1 4.3 4.7 4.7 4.0  CL 105 104 103 105 101  CO2 27 28 27 24 27   GLUCOSE 101* 102* 122* 87 141*  BUN 43* 44* 40* 39* 40*  CREATININE 1.87* 2.15* 2.02* 2.09* 2.18*  CALCIUM 9.3 9.4 9.4 9.1 9.1  MG  --   --  1.9  --  1.8   GFR Estimated Creatinine Clearance: 16.1 mL/min (A) (by C-G formula based on SCr of 2.18 mg/dL (H)). Liver Function Tests: No results for input(s): AST, ALT, ALKPHOS, BILITOT, PROT, ALBUMIN in the last 168 hours. No results for input(s): LIPASE, AMYLASE in the last 168 hours. No results for input(s): AMMONIA in the last 168 hours. Coagulation profile No results for input(s): INR, PROTIME in the last 168 hours.  CBC: Recent Labs  Lab 11/09/20 0142 11/10/20 0355 11/11/20 0233 11/14/20 0431 11/15/20 0912  WBC 3.8* 3.8* 4.2 5.7 4.6  NEUTROABS  --   --   --   --  3.4  HGB 10.4* 10.5* 10.8* 11.1* 10.9*  HCT 35.2* 35.9* 37.3 36.7 37.6  MCV 97.0 96.5 97.4 96.3 97.4  PLT 127* 120* 126* 132* 139*   Cardiac Enzymes: No results for input(s): CKTOTAL, CKMB, CKMBINDEX, TROPONINI in the last 168 hours. BNP (last 3 results) Recent Labs    03/02/20 0930  PROBNP 3,278*   CBG: No results for input(s): GLUCAP in the last 168 hours. D-Dimer: No results for input(s): DDIMER in the last 72 hours. Hgb A1c: No results for input(s): HGBA1C in the last 72 hours. Lipid Profile: No results for input(s): CHOL, HDL, LDLCALC, TRIG, CHOLHDL, LDLDIRECT in the last 72 hours. Thyroid function studies: No results  for input(s): TSH, T4TOTAL, T3FREE, THYROIDAB in the last 72 hours.  Invalid input(s): FREET3 Anemia work up: No results for input(s): VITAMINB12, FOLATE, FERRITIN, TIBC, IRON, RETICCTPCT in the last 72 hours. Sepsis Labs: Recent Labs  Lab 11/10/20 0355 11/11/20 0233 11/14/20 0431 11/15/20 0912  WBC 3.8* 4.2 5.7 4.6    Microbiology Recent Results (from the past 240 hour(s))  SARS CORONAVIRUS 2 (TAT 6-24 HRS) Nasopharyngeal Nasopharyngeal Swab     Status: None   Collection Time: 11/06/20  8:27 PM   Specimen: Nasopharyngeal  Swab  Result Value Ref Range Status   SARS Coronavirus 2 NEGATIVE NEGATIVE Final    Comment: (NOTE) SARS-CoV-2 target nucleic acids are NOT DETECTED.  The SARS-CoV-2 RNA is generally detectable in upper and lower respiratory specimens during the acute phase of infection. Negative results do not preclude SARS-CoV-2 infection, do not rule out co-infections with other pathogens, and should not be used as the sole basis for treatment or other patient management decisions. Negative results must be combined with clinical observations, patient history, and epidemiological information. The expected result is Negative.  Fact Sheet for Patients: SugarRoll.be  Fact Sheet for Healthcare Providers: https://www.woods-mathews.com/  This test is not yet approved or cleared by the [place] FDA and  has been authorized for detection and/or diagnosis of SARS-CoV-2 by FDA under an Emergency Use Authorization (EUA). This EUA will remain  in effect (meaning this test can be used) for the duration of the COVID-19 declaration under Se ction 564(b)(1) of the Act, 21 U.S.C. section 360bbb-3(b)(1), unless the authorization is terminated or revoked sooner.  Performed at [facility], [place] [address] [place], [place] [address]     Procedures and diagnostic studies:  DG Chest 2 View  Result Date: 11/15/2020 CLINICAL DATA:   Shortness of breath EXAM: CHEST - 2 VIEW COMPARISON:  11/08/2020 FINDINGS: Stable positioning of a left-sided implanted cardiac device. Stable cardiomegaly. Persistent layering bilateral pleural effusions, left slightly greater than right. Mild hazy bibasilar opacities, similar to prior. No pneumothorax. Degenerative changes of the bilateral glenohumeral joints. IMPRESSION: Persistent layering bilateral pleural effusions, left slightly greater than right. Hazy bibasilar opacities, favor atelectasis. Electronically Signed   By: Davina Poke D.O.   On: 11/15/2020 11:33    Medications:   . ALPRAZolam  0.5 mg Oral QHS  . amiodarone  200 mg Oral BID  . busPIRone  5 mg Oral BID  . furosemide  80 mg Intravenous BID  . metoprolol tartrate  12.5 mg Oral BID  . potassium chloride  20 mEq Oral BID  . Rivaroxaban  15 mg Oral Q supper  . sodium chloride flush  3 mL Intravenous Q12H   Continuous Infusions: . sodium chloride       LOS: 7 days   Darliss Cheney  [facility]   How to contact the [facility] Attending or Consulting provider [place] or covering provider during after hours [place], for this patient?  1. Check the care team in [facility] and look for a) attending/consulting TR[facility] provider listed and b) the [facility] team listed 2. Log into [email or web address] and use [place]'s universal password to access. If you do not have the password, please contact the hospital operator. 3. Locate the [facility] provider you are looking for under [facility] and page to a number that you can be directly reached. 4. If you still have difficulty reaching the provider, please page the [facility] (Director on Call) for the Hospitalists listed on amion for assistance.  11/15/2020, 12:43 PM

## 2020-11-15 NOTE — Progress Notes (Signed)
Metoprolol held this AM for BP's in the low 100's and 1 with below 100 SBP. Roby Lofts PA-C updated. Request for parameter . Do not give metoprolol if  SBP below 100 moving forward.

## 2020-11-15 NOTE — Progress Notes (Signed)
  Echocardiogram 2D Echocardiogram has been performed.  [name] 11/15/2020, 3:16 PM

## 2020-11-15 NOTE — Progress Notes (Signed)
Occupational Therapy Treatment Patient Details Name: TRANAE Roberson MRN: [id number] DOB: [birth year] Today's Date: 11/15/2020    History of present illness 87 y.o. female with a hx of NICM, BiV ICD, atrial fib persistent, high grade AV block with PPM, on amiodarone, hyperthyroidism, anemia and lower ext edema, CKD 3-4  who is being seen for the evaluation of acute on chronic diastolic heart failure with right ventricular dysfunction and PAF   OT comments  OT initially asked to hold by MD.  Patient's ICD was being adjusted due to sustained HR at 100-105 at rest.  OT checked back at the end of the day, resting HR 78 BPM.  Patient agreeable to mobility in room for bathroom use, and stand grooming task after bathroom use and before supper.  After mobility and grooming task HR steady at 88 BPM, but after 5 minutes, HR returned to 75.  OT will continue to follow her in the acute setting.  O2 at 2 L via [place].  HH has been recommended.    Follow Up Recommendations  Home health OT;Supervision - Intermittent    Equipment Recommendations  Other (comment)    Recommendations for Other Services      Precautions / Restrictions Precautions Precautions: Fall Precaution Comments: HR Restrictions Weight Bearing Restrictions: No       Mobility Bed Mobility Overal bed mobility: Modified Independent                Transfers Overall transfer level: Needs assistance   Transfers: Sit to/from Stand Sit to Stand: Min guard         General transfer comment: Assist for lines and safety    Balance           Standing balance support: Bilateral upper extremity supported Standing balance-Leahy Scale: Poor Standing balance comment: UE support and supervision                           ADL either performed or assessed with clinical judgement   ADL       Grooming: Min guard;Standing                   Toilet Transfer: Min guard;RW   Toileting- Clothing Manipulation and  Hygiene: Min guard       Functional mobility during ADLs: Min guard;Rolling walker                                                                                           Pertinent Vitals/ Pain       Pain Assessment: No/denies pain                                                          Frequency  Min 2X/week        Progress Toward Goals  OT Goals(current goals can now be found in the care plan section)  Progress towards OT goals: Progressing toward  goals  Acute Rehab OT Goals Patient Stated Goal: I want to be as indep as I can OT Goal Formulation: With patient Time For Goal Achievement: 11/27/20 Potential to Achieve Goals: Good  Plan Discharge plan remains appropriate    Co-evaluation                 AM-PAC OT "6 Clicks" Daily Activity     Outcome Measure   Help from another person eating meals?: None Help from another person taking care of personal grooming?: A Little Help from another person toileting, which includes using toliet, bedpan, or urinal?: A Little Help from another person bathing (including washing, rinsing, drying)?: A Little Help from another person to put on and taking off regular upper body clothing?: A Little Help from another person to put on and taking off regular lower body clothing?: A Little 6 Click Score: 19    End of Session Equipment Utilized During Treatment: Rolling walker  OT Visit Diagnosis: Muscle weakness (generalized) (M62.81)   Activity Tolerance Patient tolerated treatment well   Patient Left in chair;with call bell/phone within reach;with chair alarm set   Nurse Communication          Time: [phone] OT Time Calculation (min): 17 min  Charges: OT General Charges $OT Visit: 1 Visit OT Treatments $Self Care/Home Management : 8-22 mins  11/15/2020  Rich, OTR/L  Acute Rehabilitation Services  Office:  [place] 11/15/2020, 5:48 PM

## 2020-11-15 NOTE — Progress Notes (Signed)
Reviewed telemetry with EP service  Concern for tracking at upper HR of device REcomm switching to DDIR at max rate of 70    This may be contributing to CHF  Limiited echo today RV is dilated and function depressed   Worse than what was seen in echoes from  September and April TR is eccentric and appears to be more significant than in previous echoes ? Related to lead Large pleural effusion noted  CXR today with large layering effusoin noted  Would recomm thoracentesis under USN guidance.      Dorris Carnes MD

## 2020-11-15 NOTE — Progress Notes (Addendum)
Physical Therapy Treatment Patient Details Name: [name] MRN: [id number] DOB: 07-24-1930 Today's Date: 11/15/2020    History of Present Illness 87 y.o. female with a hx of NICM, BiV ICD, atrial fib persistent, high grade AV block with PPM, on amiodarone, hyperthyroidism, anemia and lower ext edema, CKD 3-4  who is being seen for the evaluation of acute on chronic diastolic heart failure with right ventricular dysfunction and PAF    PT Comments    Pt making steady progress with mobility. Requiring supplemental O2 at rest and with activity. Checked orthostatics as below. No orthostasis and denied lightheadedness.    Orthostatic BPs  Supine 107/71  Sitting 98/74  Standing 101/80     Follow Up Recommendations  Home health PT;Supervision - Intermittent     Equipment Recommendations  None recommended by PT    Recommendations for Other Services       Precautions / Restrictions Precautions Precautions: Fall    Mobility  Bed Mobility Overal bed mobility: Modified Independent             General bed mobility comments: HOB elevated  Transfers Overall transfer level: Needs assistance Equipment used: 4-wheeled walker Transfers: Sit to/from Stand Sit to Stand: Min guard         General transfer comment: Assist for lines and safety  Ambulation/Gait Ambulation/Gait assistance: Min guard Gait Distance (Feet): 200 Feet Assistive device: 4-wheeled walker Gait Pattern/deviations: Step-through pattern;Decreased stride length Gait velocity: decr Gait velocity interpretation: 1.31 - 2.62 ft/sec, indicative of limited community ambulator General Gait Details: Assist for safety and lines.   Stairs             Wheelchair Mobility    Modified Rankin (Stroke Patients Only)       Balance Overall balance assessment: Needs assistance Sitting-balance support: Feet supported Sitting balance-Leahy Scale: Fair     Standing balance support: Single extremity  supported Standing balance-Leahy Scale: Poor Standing balance comment: UE support and supervision                            Cognition Arousal/Alertness: Awake/alert Behavior During Therapy: WFL for tasks assessed/performed Overall Cognitive Status: Within Functional Limits for tasks assessed                                        Exercises      General Comments General comments (skin integrity, edema, etc.): Amb on 2L with SpO2 91%.      Pertinent Vitals/Pain Pain Assessment: No/denies pain    Home Living                      Prior Function            PT Goals (current goals can now be found in the care plan section) Progress towards PT goals: Progressing toward goals    Frequency    Min 3X/week      PT Plan Current plan remains appropriate    Co-evaluation              AM-PAC PT "6 Clicks" Mobility   Outcome Measure  Help needed turning from your back to your side while in a flat bed without using bedrails?: None Help needed moving from lying on your back to sitting on the side of a flat bed without using bedrails?: None Help  needed moving to and from a bed to a chair (including a wheelchair)?: A Little Help needed standing up from a chair using your arms (e.g., wheelchair or bedside chair)?: A Little Help needed to walk in hospital room?: A Little Help needed climbing 3-5 steps with a railing? : A Lot 6 Click Score: 19    End of Session Equipment Utilized During Treatment: Gait belt;Oxygen Activity Tolerance: Patient tolerated treatment well Patient left: in chair;with call bell/phone within reach;with chair alarm set Nurse Communication: Mobility status PT Visit Diagnosis: Unsteadiness on feet (R26.81);Muscle weakness (generalized) (M62.81);Difficulty in walking, not elsewhere classified (R26.2)     Time: [id number] PT Time Calculation (min) (ACUTE ONLY): 24 min  Charges:  $Gait Training: 23-37 mins                      [place] Pager [phone] Office [place] 11/15/2020, 11:33 AM

## 2020-11-16 ENCOUNTER — Inpatient Hospital Stay (HOSPITAL_COMMUNITY): Payer: Medicare Other

## 2020-11-16 DIAGNOSIS — N1832 Chronic kidney disease, stage 3b: Secondary | ICD-10-CM | POA: Diagnosis not present

## 2020-11-16 DIAGNOSIS — I5043 Acute on chronic combined systolic (congestive) and diastolic (congestive) heart failure: Secondary | ICD-10-CM | POA: Diagnosis not present

## 2020-11-16 HISTORY — PX: IR THORACENTESIS ASP PLEURAL SPACE W/IMG GUIDE: IMG5380

## 2020-11-16 LAB — CBC WITH DIFFERENTIAL/PLATELET
Abs Immature Granulocytes: 0.01 10*3/uL (ref 0.00–0.07)
Basophils Absolute: 0 10*3/uL (ref 0.0–0.1)
Basophils Relative: 1 %
Eosinophils Absolute: 0.1 10*3/uL (ref 0.0–0.5)
Eosinophils Relative: 2 %
HCT: 37.2 % (ref 36.0–46.0)
Hemoglobin: 11.2 g/dL — ABNORMAL LOW (ref 12.0–15.0)
Immature Granulocytes: 0 %
Lymphocytes Relative: 16 %
Lymphs Abs: 0.9 10*3/uL (ref 0.7–4.0)
MCH: 29.2 pg (ref 26.0–34.0)
MCHC: 30.1 g/dL (ref 30.0–36.0)
MCV: 96.9 fL (ref 80.0–100.0)
Monocytes Absolute: 0.6 10*3/uL (ref 0.1–1.0)
Monocytes Relative: 11 %
Neutro Abs: 3.7 10*3/uL (ref 1.7–7.7)
Neutrophils Relative %: 70 %
Platelets: 131 10*3/uL — ABNORMAL LOW (ref 150–400)
RBC: 3.84 MIL/uL — ABNORMAL LOW (ref 3.87–5.11)
RDW: 16.6 % — ABNORMAL HIGH (ref 11.5–15.5)
WBC: 5.3 10*3/uL (ref 4.0–10.5)
nRBC: 0 % (ref 0.0–0.2)

## 2020-11-16 LAB — BASIC METABOLIC PANEL
Anion gap: 10 (ref 5–15)
BUN: 36 mg/dL — ABNORMAL HIGH (ref 8–23)
CO2: 30 mmol/L (ref 22–32)
Calcium: 9.1 mg/dL (ref 8.9–10.3)
Chloride: 100 mmol/L (ref 98–111)
Creatinine, Ser: 1.99 mg/dL — ABNORMAL HIGH (ref 0.44–1.00)
GFR, Estimated: 24 mL/min — ABNORMAL LOW (ref >60)
Glucose, Bld: 103 mg/dL — ABNORMAL HIGH (ref 70–99)
Potassium: 4.3 mmol/L (ref 3.5–5.1)
Sodium: 140 mmol/L (ref 135–145)

## 2020-11-16 LAB — GLUCOSE, PLEURAL OR PERITONEAL FLUID: Glucose, Fluid: 122 mg/dL

## 2020-11-16 LAB — MAGNESIUM: Magnesium: 1.8 mg/dL (ref 1.7–2.4)

## 2020-11-16 LAB — LACTATE DEHYDROGENASE, PLEURAL OR PERITONEAL FLUID: LD, Fluid: 53 U/L — ABNORMAL HIGH (ref 3–23)

## 2020-11-16 LAB — PROTEIN, PLEURAL OR PERITONEAL FLUID: Total protein, fluid: <3 g/dL

## 2020-11-16 MED ORDER — LIDOCAINE HCL (PF) 1 % IJ SOLN
INTRAMUSCULAR | Status: DC | PRN
  Administered 2020-11-16: 10 mL

## 2020-11-16 MED ORDER — LIDOCAINE HCL 1 % IJ SOLN
INTRAMUSCULAR | Status: AC
  Filled 2020-11-16: qty 20

## 2020-11-16 MED ORDER — METOLAZONE 5 MG PO TABS
2.5000 mg | ORAL_TABLET | Freq: Once | ORAL | Status: AC
  Administered 2020-11-16: 2.5 mg via ORAL
  Filled 2020-11-16: qty 1

## 2020-11-16 NOTE — Progress Notes (Signed)
Physical Therapy Treatment Patient Details Name: [name] MRN: [id number] DOB: Jun 17, 1930 Today's Date: 11/16/2020    History of Present Illness 87 y.o. female with a hx of NICM, BiV ICD, atrial fib persistent, high grade AV block with PPM, on amiodarone, hyperthyroidism, anemia and lower ext edema, CKD 3-4  who is being seen for the evaluation of acute on chronic diastolic heart failure with right ventricular dysfunction and PAF    PT Comments    Pt with improved dyspnea and able to amb on RA with SpO2 90-92% when good pleth. SpO2 at rest 95%. Left O2 off and nurse aware. Continue to recommend HHPT after DC.    Follow Up Recommendations  Home health PT;Supervision - Intermittent     Equipment Recommendations  None recommended by PT    Recommendations for Other Services       Precautions / Restrictions Precautions Precautions: Fall    Mobility  Bed Mobility Overal bed mobility: Modified Independent             General bed mobility comments: HOB elevated and incr time  Transfers Overall transfer level: Needs assistance Equipment used: 4-wheeled walker Transfers: Sit to/from Stand Sit to Stand: Min guard         General transfer comment: Assist for lines and safety  Ambulation/Gait Ambulation/Gait assistance: Min guard Gait Distance (Feet): 160 Feet Assistive device: 4-wheeled walker Gait Pattern/deviations: Step-through pattern;Decreased stride length Gait velocity: decr Gait velocity interpretation: 1.31 - 2.62 ft/sec, indicative of limited community ambulator General Gait Details: Assist for safety and lines.   Stairs             Wheelchair Mobility    Modified Rankin (Stroke Patients Only)       Balance Overall balance assessment: Needs assistance Sitting-balance support: Feet supported Sitting balance-Leahy Scale: Fair     Standing balance support: Single extremity supported Standing balance-Leahy Scale: Poor Standing balance  comment: UE support and supervision                            Cognition Arousal/Alertness: Awake/alert Behavior During Therapy: WFL for tasks assessed/performed Overall Cognitive Status: Within Functional Limits for tasks assessed                                        Exercises      General Comments General comments (skin integrity, edema, etc.): Amb on RA with SpO2 90-92%      Pertinent Vitals/Pain Pain Assessment: No/denies pain    Home Living                      Prior Function            PT Goals (current goals can now be found in the care plan section) Progress towards PT goals: Progressing toward goals    Frequency    Min 3X/week      PT Plan Current plan remains appropriate    Co-evaluation              AM-PAC PT "6 Clicks" Mobility   Outcome Measure  Help needed turning from your back to your side while in a flat bed without using bedrails?: None Help needed moving from lying on your back to sitting on the side of a flat bed without using bedrails?: None Help needed moving to  and from a bed to a chair (including a wheelchair)?: A Little Help needed standing up from a chair using your arms (e.g., wheelchair or bedside chair)?: A Little Help needed to walk in hospital room?: A Little Help needed climbing 3-5 steps with a railing? : A Lot 6 Click Score: 19    End of Session Equipment Utilized During Treatment: Gait belt Activity Tolerance: Patient tolerated treatment well Patient left: in chair;with call bell/phone within reach;with family/visitor present Nurse Communication: Mobility status;Other (comment) (Left O2 off) PT Visit Diagnosis: Unsteadiness on feet (R26.81);Muscle weakness (generalized) (M62.81);Difficulty in walking, not elsewhere classified (R26.2)     Time: 1405-1440 PT Time Calculation (min) (ACUTE ONLY): 35 min  Charges:  $Gait Training: 23-37 mins                     [place] Pager [phone] Office [place] 11/16/2020, 4:06 PM

## 2020-11-16 NOTE — Progress Notes (Signed)
Progress Note  [name] Date of Encounter: 11/16/2020  [facility] Cardiologist: Virl Axe, MD   Subjective   Breathing is OK in bed  No CP   Inpatient Medications    Scheduled Meds: . ALPRAZolam  0.5 mg Oral QHS  . amiodarone  200 mg Oral BID  . busPIRone  5 mg Oral BID  . furosemide  80 mg Intravenous BID  . metoprolol tartrate  12.5 mg Oral BID  . potassium chloride  20 mEq Oral BID  . Rivaroxaban  15 mg Oral Q supper  . sodium chloride flush  3 mL Intravenous Q12H   Continuous Infusions: . sodium chloride     PRN Meds: sodium chloride, acetaminophen, ondansetron (ZOFRAN) IV, sodium chloride flush   Vital Signs    Vitals:   11/15/20 0608 11/15/20 1415 11/15/20 2215 11/16/20 0440  BP: 102/73 99/70 115/79 105/66  Pulse: 96 99 70 70  Resp: 16 20 20    Temp: 98.4 F (36.9 C) (!) 97.3 F (36.3 C) 98.1 F (36.7 C) 98.1 F (36.7 C)  TempSrc: Oral  Oral Oral  SpO2: 97% 95% 100% 99%  Weight: 67.3 kg   67.1 kg  Height:        Intake/Output Summary (Last 24 hours) at 11/16/2020 0756 Last data filed at 11/16/2020 0500 Gross per 24 hour  Intake 360 ml  Output 400 ml  Net -40 ml   Net I/O   -3.8 L   Last 3 Weights 11/16/2020 11/15/2020 11/14/2020  Weight (lbs) 147 lb 14.9 oz 148 lb 4.8 oz 148 lb 4.8 oz  Weight (kg) 67.1 kg 67.268 kg 67.268 kg      Telemetry    AV paced with A-sense, V-paced - Personally Reviewed  ECG    No new tracings - Personally Reviewed  Physical Exam   GEN: No acute distress.   Neck: increased JVP  Cardiac: RRR,Gr II-III/VI systolic murmur LSB to apex   Respiratory: Decreased BS at base   GI: Soft, nontender, non-distended  MS: 1+ LE edema; No deformity. Neuro:  Nonfocal  Psych: Normal affect   Labs    High Sensitivity Troponin:  No results for input(s): TROPONINIHS in the last 720 hours.    Chemistry Recent Labs  Lab 11/14/20 0431 11/15/20 0912 11/16/20 0405  NA 141 139 140  K 4.7 4.0 4.3  CL 105  101 100  CO2 24 27 30   GLUCOSE 87 141* 103*  BUN 39* 40* 36*  CREATININE 2.09* 2.18* 1.99*  CALCIUM 9.1 9.1 9.1  GFRNONAA 22* 21* 24*  ANIONGAP 12 11 10      Hematology Recent Labs  Lab 11/14/20 0431 11/15/20 0912 11/16/20 0405  WBC 5.7 4.6 5.3  RBC 3.81* 3.86* 3.84*  HGB 11.1* 10.9* 11.2*  HCT 36.7 37.6 37.2  MCV 96.3 97.4 96.9  MCH 29.1 28.2 29.2  MCHC 30.2 29.0* 30.1  RDW 16.8* 16.7* 16.6*  PLT 132* 139* 131*    BNPNo results for input(s): BNP, PROBNP in the last 168 hours.   DDimer No results for input(s): DDIMER in the last 168 hours.   Radiology    DG Chest 2 View  Result Date: 11/15/2020 CLINICAL DATA:  Shortness of breath EXAM: CHEST - 2 VIEW COMPARISON:  11/08/2020 FINDINGS: Stable positioning of a left-sided implanted cardiac device. Stable cardiomegaly. Persistent layering bilateral pleural effusions, left slightly greater than right. Mild hazy bibasilar opacities, similar to prior. No pneumothorax. Degenerative changes of the bilateral glenohumeral  joints. IMPRESSION: Persistent layering bilateral pleural effusions, left slightly greater than right. Hazy bibasilar opacities, favor atelectasis. Electronically Signed   By: Davina Poke D.O.   On: 11/15/2020 11:33   ECHOCARDIOGRAM LIMITED  Result Date: 11/15/2020    ECHOCARDIOGRAM LIMITED REPORT   Patient Name:   [name] Date of Exam: 11/15/2020 Medical Rec #:  [id number]      Height:       62.0 in Accession #:    [id number]     Weight:       148.3 lb Date of Birth:  1930-04-22      BSA:          1.684 m Patient Age:    89 years       BP:           99/70 mmHg Patient Gender: F              HR:           98 bpm. Exam Location:  Inpatient Procedure: Limited Echo, Limited Color Doppler and Cardiac Doppler Indications:    acute systolic chf  History:        Patient has prior history of Echocardiogram examinations, most                 recent 12/09/2019. Pacemaker, chronic kidney disease,                  Arrythmias:Atrial Fibrillation; Risk Factors:Hypertension.  Sonographer:    Johny Chess Referring Phys: [id number] Mattheu Brodersen V Rozann Holts IMPRESSIONS  1. LVEF slightly decreased from the prior study, now 45-50% with diffuse hypokinesis, otherwise no change from the prior study on 11/07/2020.  2. Left ventricular ejection fraction, by estimation, is 45 to 50%. The left ventricle has mildly decreased function. The left ventricle demonstrates global hypokinesis.  3. Right ventricular systolic function is moderately reduced. The right ventricular size is moderately enlarged. There is mildly elevated pulmonary artery systolic pressure. The estimated right ventricular systolic pressure is 51.0 mmHg.  4. Left atrial size was severely dilated.  5. Right atrial size was moderately dilated.  6. A small pericardial effusion is present. The pericardial effusion is posterior to the left ventricle. Moderate pleural effusion in the left lateral region.  7. Moderate mitral regurgitation however can be underestimated due to eccentric jet. The mitral valve is degenerative. Moderate mitral valve regurgitation. No evidence of mitral stenosis. There is moderate prolapse of multiple segments of the anterior leaflet of the mitral valve. Moderate mitral annular calcification.  8. Tricuspid valve regurgitation is severe.  9. The aortic valve is normal in structure. There is moderate calcification of the aortic valve. There is moderate thickening of the aortic valve. Aortic valve regurgitation is trivial. Mild to moderate aortic valve sclerosis/calcification is present, without any evidence of aortic stenosis. 10. The inferior vena cava is dilated in size with <50% respiratory variability, suggesting right atrial pressure of 15 mmHg. FINDINGS  Left Ventricle: Left ventricular ejection fraction, by estimation, is 45 to 50%. The left ventricle has mildly decreased function. The left ventricle demonstrates global hypokinesis. The left ventricular  internal cavity size was normal in size. There is  no left ventricular hypertrophy. Right Ventricle: The right ventricular size is moderately enlarged. No increase in right ventricular wall thickness. Right ventricular systolic function is moderately reduced. There is mildly elevated pulmonary artery systolic pressure. The tricuspid regurgitant velocity is 2.52 m/s, and with an assumed right atrial pressure of 15 mmHg, the  estimated right ventricular systolic pressure is 25.4 mmHg. Left Atrium: Left atrial size was severely dilated. Right Atrium: Right atrial size was moderately dilated. Pericardium: A small pericardial effusion is present. The pericardial effusion is posterior to the left ventricle. Mitral Valve: Moderate mitral regurgitation however can be underestimated due to eccentric jet. The mitral valve is degenerative in appearance. There is moderate prolapse of multiple segments of the anterior leaflet of the mitral valve. There is moderate  thickening of the mitral valve leaflet(s). Moderate mitral annular calcification. Moderate mitral valve regurgitation. No evidence of mitral valve stenosis. Tricuspid Valve: The tricuspid valve is normal in structure. Tricuspid valve regurgitation is severe. No evidence of tricuspid stenosis. Aortic Valve: The aortic valve is normal in structure. There is moderate calcification of the aortic valve. There is moderate thickening of the aortic valve. Aortic valve regurgitation is trivial. Mild to moderate aortic valve sclerosis/calcification is present, without any evidence of aortic stenosis. Pulmonic Valve: The pulmonic valve was normal in structure. Pulmonic valve regurgitation is mild. No evidence of pulmonic stenosis. Aorta: The aortic root is normal in size and structure. Venous: The inferior vena cava is dilated in size with less than 50% respiratory variability, suggesting right atrial pressure of 15 mmHg. IAS/Shunts: No atrial level shunt detected by color flow  Doppler. Additional Comments: A pacer wire is visualized in the right atrium and right ventricle. There is a moderate pleural effusion in the left lateral region. RIGHT VENTRICLE            IVC RV S prime:     7.94 cm/s  IVC diam: 2.30 cm TAPSE (M-mode): 1.3 cm AORTIC VALVE LVOT Vmax:   47.60 cm/s LVOT Vmean:  31.200 cm/s LVOT VTI:    0.071 m MR Peak grad:    81.0 mmHg   TRICUSPID VALVE MR Mean grad:    46.0 mmHg   TR Peak grad:   25.4 mmHg MR Vmax:         450.00 cm/s TR Vmax:        252.00 cm/s MR Vmean:        319.0 cm/s MR PISA:         2.26 cm    SHUNTS MR PISA Eff ROA: 20 mm      Systemic VTI: 0.07 m MR PISA Radius:  0.60 cm Ena Dawley MD Electronically signed by Ena Dawley MD Signature Date/Time: 11/15/2020/8:51:23 PM    Final     Cardiac Studies   Echo 11/16/19  1. LVEF slightly decreased from the prior study, now 45-50% with diffuse hypokinesis, otherwise no change from the prior study on 11/07/2020. 2. Left ventricular ejection fraction, by estimation, is 45 to 50%. The left ventricle has mildly decreased function. The left ventricle demonstrates global hypokinesis. 3. Right ventricular systolic function is moderately reduced. The right ventricular size is moderately enlarged. There is mildly elevated pulmonary artery systolic pressure. The estimated right ventricular systolic pressure is 27.0 mmHg. 4. Left atrial size was severely dilated. 5. Right atrial size was moderately dilated. 6. A small pericardial effusion is present. The pericardial effusion is posterior to the left ventricle. Moderate pleural effusion in the left lateral region. 7. Moderate mitral regurgitation however can be underestimated due to eccentric jet. The mitral valve is degenerative. Moderate mitral valve regurgitation. No evidence of mitral stenosis. There is moderate prolapse of multiple segments of the anterior leaflet of the mitral valve. Moderate mitral annular calcification. 8. Tricuspid valve  regurgitation is severe. 9. The aortic  valve is normal in structure. There is moderate calcification of the aortic valve. There is moderate thickening of the aortic valve. Aortic valve regurgitation is trivial. Mild to moderate aortic valve sclerosis/calcification is present, without any evidence of aortic stenosis. 10. The inferior vena cava is dilated in size with <50% respiratory variability, suggesting right atrial pressure of 15 mmHg. Echocardiogram 11/07/2020: 1. Left ventricular ejection fraction, by estimation, is 50 to 55%. The  left ventricle has low normal function. The left ventricle has no regional  wall motion abnormalities. There is mild left ventricular hypertrophy.  Left ventricular diastolic  parameters are indeterminate.  2. Right ventricular systolic function is moderately reduced. The right  ventricular size is moderately enlarged. There is mildly elevated  pulmonary artery systolic pressure. The estimated right ventricular  systolic pressure is 09.8 mmHg.  3. Right atrial size was severely dilated.  4. The mitral valve is degenerative. Moderate mitral annular  calcification. No evidence of mitral stenosis. Moderate mitral valve  regurgitation. There is significant prolapse of the anterior leaflet with  posterior directed MR jet that appears moderate  but may be underestimating as eccentric jet and not well visualized on  apical views. Could consider TEE or cardiac MRI for further evaluation  5. Tricuspid valve regurgitation is moderate.  6. The aortic valve is tricuspid. Aortic valve regurgitation is not  visualized. Mild to moderate aortic valve sclerosis/calcification is  present, without any evidence of aortic stenosis.  7. The inferior vena cava is dilated in size with <50% respiratory  variability, suggesting right atrial pressure of 15 mmHg.   Patient Profile     87 y.o. female with a hx of NICM, BiV ICD, atrial fib persistent, high grade AV block  with PPM, on amiodarone, hyperthyroidism, anemia and lower ext edema, CKD 3-4who is being seen for the evaluation of acute on chronic diastolic heart failure with right ventricular dysfunction and PAF. Of note, PCP noted sCr increased to 1.68 and stopped lasix due to concern for dehydration.  Assessment & Plan    1. Acute on chronic combined CHF: patient presented with LE edema, DOE, and orthopnea in the setting of discontinuation of her lasix by PCP 1 month ago.  history of NICM with EF 25-30% 02/2020 in the setting of V-pacing - She underwent BiV upgrade in July.   IN early November 2021 she was doing good  NO edema  (seen in clinic by Olin Pia)   In Dec 2021 she was seen by Claudina Lick.  Edema present    Lasix increased transiently to 80 then down to 60    She continued to have edema and weakness and brought to ED  Since admit she has been diuresing some but not much   SOme days exam worse than others.   She is a net neg by I/O but wt not much different     Yesterday pacer reprogrammed to DDIR to see if this contributing  Pt put on for thoracentesis today for large pleural effusion WIll add 2.5 mg Zaroxylyn today     2.5 and and continue BID 80 lasix .  >2  Echo this admit showed LVEF was improved from echo pre BiV pacer but RV function down from echo done in Sept 2021   TR appeared more prominent.   Now at least moderate if not severe   QUestion if related to lead implant from BIV  With worsening TR  THis may be the continued problem for pt  2. Paroxysmal atrial fibrillation: device interrogation shows no Afib since 11/08/20 though did have a 5.4% burden 12/23-12/30. Her amiodarone was increased from 100mg  daily to 200mg  BID - Continue metoprolol for rate control - Continue amiodarone for rhythm control - Continue xarelto for stroke ppx WIll continue higher dose for short term and follow as outpt with reinterrogation of pacer   Patient will do better if can maintain SR   3. SSS s/p PPM and  then BiV upgrade in 05/2020 :Device was reprogrammed yesterday to Page.  COncern that it was tracking an atrial tach and pacing at upper 90s    NOw, top rate at 70    4. Mitral regurgitation: moderate on echo this admission.  - Continue routine annual outpatient monitoring  5  TR   Eccentric jet   Severe  5. CKD stage III-IV: Cr 1.99  Near baseline  WIll give Zaroxylyn today with lasix       For questions or updates, please contact [place] Please consult [email or web address] for contact info under        Signed, Dorris Carnes, MD  11/16/2020, 7:56 AM

## 2020-11-16 NOTE — Care Management Important Message (Signed)
Important Message  Patient Details  Name: [name] MRN: [id number] Date of Birth: [birth year]   Medicare Important Message Given:  Yes     [name] 11/16/2020, 12:46 PM

## 2020-11-16 NOTE — Progress Notes (Signed)
Progress Note    [name]  TGG:[id number] DOB: Jan 25, 1930  DOA: 11/06/2020 PCP: Chesley Noon, MD    Brief Narrative:    Medical records reviewed and are as summarized below:  [name] is an 87 y.o. female with medical history significant of A.Fib on amiodarone and Xarelto, HTN, SSS s/p PPM.  CHF, CKD. Pt with h/o dCHF previously, echo in April 2021 showed newly reduced EF 25-30%, cards suspected this possibly due to her being paced 100% of the time.  Looks like adjustments were made, repeat echo in Sept 2021 showed normal EF. Last month her PCP noted that creat had increased to 1.68, was concerned that she was dehydrated and so stopped her lasix. Since that time she has had worsening edema now progressed to anasarca, SOB, DOE, orthopnea. Seen in cards office and had labs drawn last week on 12/23.  Cards put her on lasix 80mg  daily for 3 days then back to 60mg  daily.  Slow to improve with treatment of presumed UTI and adjustment of rate controlling meds and diuretics   Assessment/Plan:   Principal Problem:   CKD (chronic kidney disease) stage 3, GFR 30-59 ml/min (HCC) Active Problems:   Hypertension   Sick sinus syndrome (HCC)   Paroxysmal atrial fibrillation (HCC)   Biventricular cardiac pacemaker in situ   Acute on chronic combined systolic and diastolic CHF (congestive heart failure) (HCC)   AKI (acute kidney injury) ([place])   Dyspnea   Tricuspid valve regurgitation    Acute on chronic diastolic congestive heart failure: Cardiology on board. history of NICM with EF 25-30% 02/2020 in the setting of V-pacing - She underwent BiV upgrade in July.   IN early November 2021 she was doing good  NO edema  (seen in clinic by Olin Pia)   In Dec 2021 she was seen by Claudina Lick. She remains on Lasix IV 80 mg twice daily. Patient feels much better after thoracentesis. Fluid balance net negative about 3.7 L so far. Appreciate cardiology help and management deferred to  them.  Left pleural effusion: Status post thoracentesis with 400 mL of hazy amber fluid.  AKI on CKD 3b -creatinine has remained fairly stable around 2.1 since last 4-5 days but has improved a little bit today.  Monitor closely.  SSS and biventricular PPM -  -follows with Dr. Caryl Comes  HTN -Controlled.  Continue low-dose metoprolol.  PAF -remains on amiodarone 200 mg p.o. twice daily as well as Xarelto.  Managed by cardiology.  ? UTI -present on admission U/A -dose of IV rocephin x3 days- completed 1/4   Family Communication/Anticipated D/C date and plan/Code Status   DVT prophylaxis: xarelto Code Status: Full Code.  Disposition Plan: Status is: inpt Family communication: Daughter at the bedside, discussed with her. The patient will require care spanning > 2 midnights and should be moved to inpatient because: Inpatient level of care appropriate due to severity of illness  Dispo: The patient is from: Home              Anticipated d/c is to: Home              Anticipated d/c date is: 1 to 2 days              Patient currently is not medically stable to d/c.           Medical Consultants:    cards     Subjective:  Patient seen and examined. Daughter at  the bedside. Patient feels much better after thoracentesis. No new complaint.  Objective:    Vitals:   11/15/20 1415 11/15/20 2215 11/16/20 0440 11/16/20 1046  BP: 99/70 115/79 105/66 104/62  Pulse: 99 70 70 70  Resp: 20 20    Temp: (!) 97.3 F (36.3 C) 98.1 F (36.7 C) 98.1 F (36.7 C)   TempSrc:  Oral Oral   SpO2: 95% 100% 99%   Weight:   67.1 kg   Height:        Intake/Output Summary (Last 24 hours) at 11/16/2020 1346 Last data filed at 11/16/2020 0500 Gross per 24 hour  Intake 360 ml  Output 400 ml  Net -40 ml   Filed Weights   11/14/20 0404 11/15/20 0608 11/16/20 0440  Weight: 67.3 kg 67.3 kg 67.1 kg    Exam:  General exam: Appears calm and comfortable  Respiratory system: Very faint crackles  bibasilar. Respiratory effort normal. Cardiovascular system: S1 & S2 heard, RRR. No JVD, murmurs, rubs, gallops or clicks. No pedal edema. Gastrointestinal system: Abdomen is nondistended, soft and nontender. No organomegaly or masses felt. Normal bowel sounds heard. Central nervous system: Alert and oriented. No focal neurological deficits. Extremities: Symmetric 5 x 5 power. Skin: No rashes, lesions or ulcers.  Psychiatry: Judgement and insight appear normal. Mood & affect appropriate.    Data Reviewed:   I have personally reviewed following labs and imaging studies:  Labs: Labs show the following:   Basic Metabolic Panel: Recent Labs  Lab 11/11/20 0233 11/12/20 0213 11/14/20 0431 11/15/20 0912 11/16/20 0405  NA 143 141 141 139 140  K 4.3 4.7 4.7 4.0 4.3  CL 104 103 105 101 100  CO2 28 27 24 27 30   GLUCOSE 102* 122* 87 141* 103*  BUN 44* 40* 39* 40* 36*  CREATININE 2.15* 2.02* 2.09* 2.18* 1.99*  CALCIUM 9.4 9.4 9.1 9.1 9.1  MG  --  1.9  --  1.8 1.8   GFR Estimated Creatinine Clearance: 17.6 mL/min (A) (by C-G formula based on SCr of 1.99 mg/dL (H)). Liver Function Tests: No results for input(s): AST, ALT, ALKPHOS, BILITOT, PROT, ALBUMIN in the last 168 hours. No results for input(s): LIPASE, AMYLASE in the last 168 hours. No results for input(s): AMMONIA in the last 168 hours. Coagulation profile No results for input(s): INR, PROTIME in the last 168 hours.  CBC: Recent Labs  Lab 11/10/20 0355 11/11/20 0233 11/14/20 0431 11/15/20 0912 11/16/20 0405  WBC 3.8* 4.2 5.7 4.6 5.3  NEUTROABS  --   --   --  3.4 3.7  HGB 10.5* 10.8* 11.1* 10.9* 11.2*  HCT 35.9* 37.3 36.7 37.6 37.2  MCV 96.5 97.4 96.3 97.4 96.9  PLT 120* 126* 132* 139* 131*   Cardiac Enzymes: No results for input(s): CKTOTAL, CKMB, CKMBINDEX, TROPONINI in the last 168 hours. BNP (last 3 results) Recent Labs    03/02/20 0930  PROBNP 3,278*   CBG: No results for input(s): GLUCAP in the last 168  hours. D-Dimer: No results for input(s): DDIMER in the last 72 hours. Hgb A1c: No results for input(s): HGBA1C in the last 72 hours. Lipid Profile: No results for input(s): CHOL, HDL, LDLCALC, TRIG, CHOLHDL, LDLDIRECT in the last 72 hours. Thyroid function studies: No results for input(s): TSH, T4TOTAL, T3FREE, THYROIDAB in the last 72 hours.  Invalid input(s): FREET3 Anemia work up: No results for input(s): VITAMINB12, FOLATE, FERRITIN, TIBC, IRON, RETICCTPCT in the last 72 hours. Sepsis Labs: Recent Labs  Lab  11/11/20 0233 11/14/20 0431 11/15/20 0912 11/16/20 0405  WBC 4.2 5.7 4.6 5.3    Microbiology Recent Results (from the past 240 hour(s))  SARS CORONAVIRUS 2 (TAT 6-24 HRS) Nasopharyngeal Nasopharyngeal Swab     Status: None   Collection Time: 11/06/20  8:27 PM   Specimen: Nasopharyngeal Swab  Result Value Ref Range Status   SARS Coronavirus 2 NEGATIVE NEGATIVE Final    Comment: (NOTE) SARS-CoV-2 target nucleic acids are NOT DETECTED.  The SARS-CoV-2 RNA is generally detectable in upper and lower respiratory specimens during the acute phase of infection. Negative results do not preclude SARS-CoV-2 infection, do not rule out co-infections with other pathogens, and should not be used as the sole basis for treatment or other patient management decisions. Negative results must be combined with clinical observations, patient history, and epidemiological information. The expected result is Negative.  Fact Sheet for Patients: SugarRoll.be  Fact Sheet for Healthcare Providers: https://www.woods-mathews.com/  This test is not yet approved or cleared by the [place] FDA and  has been authorized for detection and/or diagnosis of SARS-CoV-2 by FDA under an Emergency Use Authorization (EUA). This EUA will remain  in effect (meaning this test can be used) for the duration of the COVID-19 declaration under Se ction 564(b)(1) of  the Act, 21 U.S.C. section 360bbb-3(b)(1), unless the authorization is terminated or revoked sooner.  Performed at [facility], [place] [address] [place], [place] [address]     Procedures and diagnostic studies:  DG Chest 1 View  Result Date: 11/16/2020 CLINICAL DATA:  Status post left thoracentesis EXAM: CHEST  1 VIEW COMPARISON:  11/15/2020 FINDINGS: Interval decrease of left pleural effusion.  No pneumothorax. Unchanged cardiomegaly. No significant abnormality of the right lung. Linear edge along the inferior aspect of the right lateral lung base is likely a skin fold. Unchanged left chest wall pacemaker. IMPRESSION: No pneumothorax status post left thoracentesis. Electronically Signed   By: Miachel Roux M.D.   On: 11/16/2020 09:30   DG Chest 2 View  Result Date: 11/15/2020 CLINICAL DATA:  Shortness of breath EXAM: CHEST - 2 VIEW COMPARISON:  11/08/2020 FINDINGS: Stable positioning of a left-sided implanted cardiac device. Stable cardiomegaly. Persistent layering bilateral pleural effusions, left slightly greater than right. Mild hazy bibasilar opacities, similar to prior. No pneumothorax. Degenerative changes of the bilateral glenohumeral joints. IMPRESSION: Persistent layering bilateral pleural effusions, left slightly greater than right. Hazy bibasilar opacities, favor atelectasis. Electronically Signed   By: Davina Poke D.O.   On: 11/15/2020 11:33   ECHOCARDIOGRAM LIMITED  Result Date: 11/15/2020    ECHOCARDIOGRAM LIMITED REPORT   Patient Name:   [name] Date of Exam: 11/15/2020 Medical Rec #:  [id number]      Height:       62.0 in Accession #:    [id number]     Weight:       148.3 lb Date of Birth:  [birth year]      BSA:          1.684 m Patient Age:    71 years       BP:           99/70 mmHg Patient Gender: F              HR:           98 bpm. Exam Location:  Inpatient Procedure: Limited Echo, Limited Color Doppler and Cardiac Doppler Indications:    acute systolic chf   History:  Patient has prior history of Echocardiogram examinations, most                 recent 12/09/2019. Pacemaker, chronic kidney disease,                 Arrythmias:Atrial Fibrillation; Risk Factors:Hypertension.  Sonographer:    Johny Chess Referring Phys: [id number] PAULA V ROSS IMPRESSIONS  1. LVEF slightly decreased from the prior study, now 45-50% with diffuse hypokinesis, otherwise no change from the prior study on 11/07/2020.  2. Left ventricular ejection fraction, by estimation, is 45 to 50%. The left ventricle has mildly decreased function. The left ventricle demonstrates global hypokinesis.  3. Right ventricular systolic function is moderately reduced. The right ventricular size is moderately enlarged. There is mildly elevated pulmonary artery systolic pressure. The estimated right ventricular systolic pressure is 19.3 mmHg.  4. Left atrial size was severely dilated.  5. Right atrial size was moderately dilated.  6. A small pericardial effusion is present. The pericardial effusion is posterior to the left ventricle. Moderate pleural effusion in the left lateral region.  7. Moderate mitral regurgitation however can be underestimated due to eccentric jet. The mitral valve is degenerative. Moderate mitral valve regurgitation. No evidence of mitral stenosis. There is moderate prolapse of multiple segments of the anterior leaflet of the mitral valve. Moderate mitral annular calcification.  8. Tricuspid valve regurgitation is severe.  9. The aortic valve is normal in structure. There is moderate calcification of the aortic valve. There is moderate thickening of the aortic valve. Aortic valve regurgitation is trivial. Mild to moderate aortic valve sclerosis/calcification is present, without any evidence of aortic stenosis. 10. The inferior vena cava is dilated in size with <50% respiratory variability, suggesting right atrial pressure of 15 mmHg. FINDINGS  Left Ventricle: Left ventricular ejection  fraction, by estimation, is 45 to 50%. The left ventricle has mildly decreased function. The left ventricle demonstrates global hypokinesis. The left ventricular internal cavity size was normal in size. There is  no left ventricular hypertrophy. Right Ventricle: The right ventricular size is moderately enlarged. No increase in right ventricular wall thickness. Right ventricular systolic function is moderately reduced. There is mildly elevated pulmonary artery systolic pressure. The tricuspid regurgitant velocity is 2.52 m/s, and with an assumed right atrial pressure of 15 mmHg, the estimated right ventricular systolic pressure is 79.0 mmHg. Left Atrium: Left atrial size was severely dilated. Right Atrium: Right atrial size was moderately dilated. Pericardium: A small pericardial effusion is present. The pericardial effusion is posterior to the left ventricle. Mitral Valve: Moderate mitral regurgitation however can be underestimated due to eccentric jet. The mitral valve is degenerative in appearance. There is moderate prolapse of multiple segments of the anterior leaflet of the mitral valve. There is moderate  thickening of the mitral valve leaflet(s). Moderate mitral annular calcification. Moderate mitral valve regurgitation. No evidence of mitral valve stenosis. Tricuspid Valve: The tricuspid valve is normal in structure. Tricuspid valve regurgitation is severe. No evidence of tricuspid stenosis. Aortic Valve: The aortic valve is normal in structure. There is moderate calcification of the aortic valve. There is moderate thickening of the aortic valve. Aortic valve regurgitation is trivial. Mild to moderate aortic valve sclerosis/calcification is present, without any evidence of aortic stenosis. Pulmonic Valve: The pulmonic valve was normal in structure. Pulmonic valve regurgitation is mild. No evidence of pulmonic stenosis. Aorta: The aortic root is normal in size and structure. Venous: The inferior vena cava is  dilated in size with less  than 50% respiratory variability, suggesting right atrial pressure of 15 mmHg. IAS/Shunts: No atrial level shunt detected by color flow Doppler. Additional Comments: A pacer wire is visualized in the right atrium and right ventricle. There is a moderate pleural effusion in the left lateral region. RIGHT VENTRICLE            IVC RV S prime:     7.94 cm/s  IVC diam: 2.30 cm TAPSE (M-mode): 1.3 cm AORTIC VALVE LVOT Vmax:   47.60 cm/s LVOT Vmean:  31.200 cm/s LVOT VTI:    0.071 m MR Peak grad:    81.0 mmHg   TRICUSPID VALVE MR Mean grad:    46.0 mmHg   TR Peak grad:   25.4 mmHg MR Vmax:         450.00 cm/s TR Vmax:        252.00 cm/s MR Vmean:        319.0 cm/s MR PISA:         2.26 cm    SHUNTS MR PISA Eff ROA: 20 mm      Systemic VTI: 0.07 m MR PISA Radius:  0.60 cm Ena Dawley MD Electronically signed by Ena Dawley MD Signature Date/Time: 11/15/2020/8:51:23 PM    Final    IR THORACENTESIS ASP PLEURAL SPACE W/IMG GUIDE  Result Date: 11/16/2020 INDICATION: Patient with history of acute on chronic HF, CKD stage III-IV, dyspnea, and bilateral pleural effusions, L>R. Request is made for diagnostic therapeutic left thoracentesis. EXAM: ULTRASOUND GUIDED DIAGNOSTIC AND THERAPEUTIC LEFT THORACENTESIS MEDICATIONS: 8 mL 1% Lidocaine COMPLICATIONS: None immediate. PROCEDURE: An ultrasound guided thoracentesis was thoroughly discussed with the patient and questions answered. The benefits, risks, alternatives and complications were also discussed. The patient understands and wishes to proceed with the procedure. Written consent was obtained. Ultrasound was performed to localize and mark an adequate pocket of fluid in the left chest. The area was then prepped and draped in the normal sterile fashion. 1% Lidocaine was used for local anesthesia. Under ultrasound guidance a 6 Fr Safe-T-Centesis catheter was introduced. Thoracentesis was performed. The catheter was removed and a dressing applied.  FINDINGS: A total of approximately 400 mL of hazy amber fluid was removed. Samples were sent to the laboratory as requested by the clinical team. IMPRESSION: Successful ultrasound guided left thoracentesis yielding 400 mL of pleural fluid. Read by: Earley Abide, PA-C Electronically Signed   By: Jerilynn Mages.  Shick M.D.   On: 11/16/2020 09:44    Medications:   . ALPRAZolam  0.5 mg Oral QHS  . amiodarone  200 mg Oral BID  . busPIRone  5 mg Oral BID  . furosemide  80 mg Intravenous BID  . lidocaine      . metoprolol tartrate  12.5 mg Oral BID  . potassium chloride  20 mEq Oral BID  . Rivaroxaban  15 mg Oral Q supper  . sodium chloride flush  3 mL Intravenous Q12H   Continuous Infusions: . sodium chloride       LOS: 8 days   Darliss Cheney  [facility]   How to contact the [facility] Attending or Consulting provider [place] or covering provider during after hours [place], for this patient?  1. Check the care team in [facility] and look for a) attending/consulting [facility] provider listed and b) the [facility] team listed 2. Log into [email or web address] and use [place] universal password to access. If you do not have the password, please contact the hospital operator. 3. Locate the [facility]  provider you are looking for under [facility] and page to a number that you can be directly reached. 4. If you still have difficulty reaching the provider, please page the [facility] (Director on Call) for the Hospitalists listed on amion for assistance.  11/16/2020, 1:46 PM

## 2020-11-16 NOTE — Procedures (Signed)
PROCEDURE SUMMARY:  Successful image-guided left thoracentesis. Yielded 400 milliliters of hazy amber fluid. Patient tolerated procedure well. No immediate complications. EBL < 1 mL.  Specimen was sent for labs. CXR ordered.  Please see imaging section of Epic for full dictation.   Claris Pong Oaklyn Jakubek PA-C 11/16/2020 9:14 AM

## 2020-11-17 DIAGNOSIS — N1832 Chronic kidney disease, stage 3b: Secondary | ICD-10-CM | POA: Diagnosis not present

## 2020-11-17 DIAGNOSIS — I5043 Acute on chronic combined systolic (congestive) and diastolic (congestive) heart failure: Secondary | ICD-10-CM | POA: Diagnosis not present

## 2020-11-17 LAB — COMPREHENSIVE METABOLIC PANEL
ALT: 42 U/L (ref 0–44)
AST: 53 U/L — ABNORMAL HIGH (ref 15–41)
Albumin: 2.8 g/dL — ABNORMAL LOW (ref 3.5–5.0)
Alkaline Phosphatase: 87 U/L (ref 38–126)
Anion gap: 12 (ref 5–15)
BUN: 37 mg/dL — ABNORMAL HIGH (ref 8–23)
CO2: 34 mmol/L — ABNORMAL HIGH (ref 22–32)
Calcium: 9 mg/dL (ref 8.9–10.3)
Chloride: 95 mmol/L — ABNORMAL LOW (ref 98–111)
Creatinine, Ser: 1.95 mg/dL — ABNORMAL HIGH (ref 0.44–1.00)
GFR, Estimated: 24 mL/min — ABNORMAL LOW (ref >60)
Glucose, Bld: 86 mg/dL (ref 70–99)
Potassium: 3.5 mmol/L (ref 3.5–5.1)
Sodium: 141 mmol/L (ref 135–145)
Total Bilirubin: 2.3 mg/dL — ABNORMAL HIGH (ref 0.3–1.2)
Total Protein: 6 g/dL — ABNORMAL LOW (ref 6.5–8.1)

## 2020-11-17 LAB — MAGNESIUM: Magnesium: 1.8 mg/dL (ref 1.7–2.4)

## 2020-11-17 MED ORDER — POTASSIUM CHLORIDE CRYS ER 20 MEQ PO TBCR
20.0000 meq | EXTENDED_RELEASE_TABLET | Freq: Every day | ORAL | Status: DC
  Administered 2020-11-17 – 2020-11-21 (×5): 20 meq via ORAL
  Filled 2020-11-17 (×5): qty 1

## 2020-11-17 MED ORDER — FUROSEMIDE 80 MG PO TABS: 80.0000 mg | ORAL_TABLET | Freq: Every day | ORAL | Status: DC

## 2020-11-17 MED ORDER — FUROSEMIDE 10 MG/ML IJ SOLN
80.0000 mg | Freq: Every day | INTRAMUSCULAR | Status: DC
  Administered 2020-11-17 – 2020-11-20 (×4): 80 mg via INTRAVENOUS
  Filled 2020-11-17 (×4): qty 8

## 2020-11-17 MED ORDER — BISACODYL 10 MG RE SUPP
10.0000 mg | Freq: Once | RECTAL | Status: DC
  Filled 2020-11-17: qty 1

## 2020-11-17 MED ORDER — DIPHENHYDRAMINE HCL 50 MG/ML IJ SOLN: 25.0000 mg | Freq: Once | INTRAMUSCULAR | Status: DC

## 2020-11-17 NOTE — Progress Notes (Incomplete)
Progress Note  [name] Date of Encounter: 11/17/2020  [facility] Cardiologist: Virl Axe, MD   Subjective     Inpatient Medications    Scheduled Meds: . ALPRAZolam  0.5 mg Oral QHS  . amiodarone  200 mg Oral BID  . busPIRone  5 mg Oral BID  . furosemide  80 mg Intravenous BID  . metoprolol tartrate  12.5 mg Oral BID  . potassium chloride  20 mEq Oral BID  . Rivaroxaban  15 mg Oral Q supper  . sodium chloride flush  3 mL Intravenous Q12H   Continuous Infusions: . sodium chloride     PRN Meds: sodium chloride, acetaminophen, lidocaine (PF), ondansetron (ZOFRAN) IV, sodium chloride flush   Vital Signs    Vitals:   11/16/20 1046 11/16/20 1600 11/16/20 1800 11/16/20 2044  BP: 104/62   111/73  Pulse: 70   70  Resp:    18  Temp:    98.8 F (37.1 C)  TempSrc:    Oral  SpO2:  95% 96% 100%  Weight:      Height:        Intake/Output Summary (Last 24 hours) at 11/17/2020 0553 Last data filed at 11/16/2020 2130 Gross per 24 hour  Intake 360 ml  Output 1700 ml  Net -1340 ml   Net I/O   -5  L   Last 3 Weights 11/16/2020 11/15/2020 11/14/2020  Weight (lbs) 147 lb 14.9 oz 148 lb 4.8 oz 148 lb 4.8 oz  Weight (kg) 67.1 kg 67.268 kg 67.268 kg      Telemetry    AV paced with A-sense, V-paced - Personally Reviewed  ECG    No new tracings - Personally Reviewed  Physical Exam   GEN: No acute distress.   Neck: increased JVP  Cardiac: RRR,Gr II-III/VI systolic murmur LSB to apex   Respiratory: Decreased BS at base   GI: Soft, nontender, non-distended  MS: 1+ LE edema; No deformity. Neuro:  Nonfocal  Psych: Normal affect   Labs    High Sensitivity Troponin:  No results for input(s): TROPONINIHS in the last 720 hours.    Chemistry Recent Labs  Lab 11/14/20 0431 11/15/20 0912 11/16/20 0405  NA 141 139 140  K 4.7 4.0 4.3  CL 105 101 100  CO2 24 27 30   GLUCOSE 87 141* 103*  BUN 39* 40* 36*  CREATININE 2.09* 2.18* 1.99*  CALCIUM 9.1  9.1 9.1  GFRNONAA 22* 21* 24*  ANIONGAP 12 11 10      Hematology Recent Labs  Lab 11/14/20 0431 11/15/20 0912 11/16/20 0405  WBC 5.7 4.6 5.3  RBC 3.81* 3.86* 3.84*  HGB 11.1* 10.9* 11.2*  HCT 36.7 37.6 37.2  MCV 96.3 97.4 96.9  MCH 29.1 28.2 29.2  MCHC 30.2 29.0* 30.1  RDW 16.8* 16.7* 16.6*  PLT 132* 139* 131*    BNPNo results for input(s): BNP, PROBNP in the last 168 hours.   DDimer No results for input(s): DDIMER in the last 168 hours.   Radiology    DG Chest 1 View  Result Date: 11/16/2020 CLINICAL DATA:  Status post left thoracentesis EXAM: CHEST  1 VIEW COMPARISON:  11/15/2020 FINDINGS: Interval decrease of left pleural effusion.  No pneumothorax. Unchanged cardiomegaly. No significant abnormality of the right lung. Linear edge along the inferior aspect of the right lateral lung base is likely a skin fold. Unchanged left chest wall pacemaker. IMPRESSION: No pneumothorax status post left thoracentesis. Electronically Signed  By: Sharen Heck  Mir M.D.   On: 11/16/2020 09:30   DG Chest 2 View  Result Date: 11/15/2020 CLINICAL DATA:  Shortness of breath EXAM: CHEST - 2 VIEW COMPARISON:  11/08/2020 FINDINGS: Stable positioning of a left-sided implanted cardiac device. Stable cardiomegaly. Persistent layering bilateral pleural effusions, left slightly greater than right. Mild hazy bibasilar opacities, similar to prior. No pneumothorax. Degenerative changes of the bilateral glenohumeral joints. IMPRESSION: Persistent layering bilateral pleural effusions, left slightly greater than right. Hazy bibasilar opacities, favor atelectasis. Electronically Signed   By: Davina Poke D.O.   On: 11/15/2020 11:33   ECHOCARDIOGRAM LIMITED  Result Date: 11/15/2020    ECHOCARDIOGRAM LIMITED REPORT   Patient Name:   [name] Date of Exam: 11/15/2020 Medical Rec #:  [id number]      Height:       62.0 in Accession #:    [id number]     Weight:       148.3 lb Date of Birth:  [birth year]      BSA:           1.684 m Patient Age:    87 years       BP:           99/70 mmHg Patient Gender: F              HR:           98 bpm. Exam Location:  Inpatient Procedure: Limited Echo, Limited Color Doppler and Cardiac Doppler Indications:    acute systolic chf  History:        Patient has prior history of Echocardiogram examinations, most                 recent 12/09/2019. Pacemaker, chronic kidney disease,                 Arrythmias:Atrial Fibrillation; Risk Factors:Hypertension.  Sonographer:    Johny Chess Referring Phys: [id number] Alanys Godino V Leisa Gault IMPRESSIONS  1. LVEF slightly decreased from the prior study, now 45-50% with diffuse hypokinesis, otherwise no change from the prior study on 11/07/2020.  2. Left ventricular ejection fraction, by estimation, is 45 to 50%. The left ventricle has mildly decreased function. The left ventricle demonstrates global hypokinesis.  3. Right ventricular systolic function is moderately reduced. The right ventricular size is moderately enlarged. There is mildly elevated pulmonary artery systolic pressure. The estimated right ventricular systolic pressure is 85.6 mmHg.  4. Left atrial size was severely dilated.  5. Right atrial size was moderately dilated.  6. A small pericardial effusion is present. The pericardial effusion is posterior to the left ventricle. Moderate pleural effusion in the left lateral region.  7. Moderate mitral regurgitation however can be underestimated due to eccentric jet. The mitral valve is degenerative. Moderate mitral valve regurgitation. No evidence of mitral stenosis. There is moderate prolapse of multiple segments of the anterior leaflet of the mitral valve. Moderate mitral annular calcification.  8. Tricuspid valve regurgitation is severe.  9. The aortic valve is normal in structure. There is moderate calcification of the aortic valve. There is moderate thickening of the aortic valve. Aortic valve regurgitation is trivial. Mild to moderate aortic valve  sclerosis/calcification is present, without any evidence of aortic stenosis. 10. The inferior vena cava is dilated in size with <50% respiratory variability, suggesting right atrial pressure of 15 mmHg. FINDINGS  Left Ventricle: Left ventricular ejection fraction, by estimation, is 45 to 50%. The left ventricle has mildly decreased function. The left  ventricle demonstrates global hypokinesis. The left ventricular internal cavity size was normal in size. There is  no left ventricular hypertrophy. Right Ventricle: The right ventricular size is moderately enlarged. No increase in right ventricular wall thickness. Right ventricular systolic function is moderately reduced. There is mildly elevated pulmonary artery systolic pressure. The tricuspid regurgitant velocity is 2.52 m/s, and with an assumed right atrial pressure of 15 mmHg, the estimated right ventricular systolic pressure is 02.4 mmHg. Left Atrium: Left atrial size was severely dilated. Right Atrium: Right atrial size was moderately dilated. Pericardium: A small pericardial effusion is present. The pericardial effusion is posterior to the left ventricle. Mitral Valve: Moderate mitral regurgitation however can be underestimated due to eccentric jet. The mitral valve is degenerative in appearance. There is moderate prolapse of multiple segments of the anterior leaflet of the mitral valve. There is moderate  thickening of the mitral valve leaflet(s). Moderate mitral annular calcification. Moderate mitral valve regurgitation. No evidence of mitral valve stenosis. Tricuspid Valve: The tricuspid valve is normal in structure. Tricuspid valve regurgitation is severe. No evidence of tricuspid stenosis. Aortic Valve: The aortic valve is normal in structure. There is moderate calcification of the aortic valve. There is moderate thickening of the aortic valve. Aortic valve regurgitation is trivial. Mild to moderate aortic valve sclerosis/calcification is present, without  any evidence of aortic stenosis. Pulmonic Valve: The pulmonic valve was normal in structure. Pulmonic valve regurgitation is mild. No evidence of pulmonic stenosis. Aorta: The aortic root is normal in size and structure. Venous: The inferior vena cava is dilated in size with less than 50% respiratory variability, suggesting right atrial pressure of 15 mmHg. IAS/Shunts: No atrial level shunt detected by color flow Doppler. Additional Comments: A pacer wire is visualized in the right atrium and right ventricle. There is a moderate pleural effusion in the left lateral region. RIGHT VENTRICLE            IVC RV S prime:     7.94 cm/s  IVC diam: 2.30 cm TAPSE (M-mode): 1.3 cm AORTIC VALVE LVOT Vmax:   47.60 cm/s LVOT Vmean:  31.200 cm/s LVOT VTI:    0.071 m MR Peak grad:    81.0 mmHg   TRICUSPID VALVE MR Mean grad:    46.0 mmHg   TR Peak grad:   25.4 mmHg MR Vmax:         450.00 cm/s TR Vmax:        252.00 cm/s MR Vmean:        319.0 cm/s MR PISA:         2.26 cm    SHUNTS MR PISA Eff ROA: 20 mm      Systemic VTI: 0.07 m MR PISA Radius:  0.60 cm Ena Dawley MD Electronically signed by Ena Dawley MD Signature Date/Time: 11/15/2020/8:51:23 PM    Final    IR THORACENTESIS ASP PLEURAL SPACE W/IMG GUIDE  Result Date: 11/16/2020 INDICATION: Patient with history of acute on chronic HF, CKD stage III-IV, dyspnea, and bilateral pleural effusions, L>R. Request is made for diagnostic therapeutic left thoracentesis. EXAM: ULTRASOUND GUIDED DIAGNOSTIC AND THERAPEUTIC LEFT THORACENTESIS MEDICATIONS: 8 mL 1% Lidocaine COMPLICATIONS: None immediate. PROCEDURE: An ultrasound guided thoracentesis was thoroughly discussed with the patient and questions answered. The benefits, risks, alternatives and complications were also discussed. The patient understands and wishes to proceed with the procedure. Written consent was obtained. Ultrasound was performed to localize and mark an adequate pocket of fluid in the left chest. The area  was then prepped and draped in the normal sterile fashion. 1% Lidocaine was used for local anesthesia. Under ultrasound guidance a 6 Fr Safe-T-Centesis catheter was introduced. Thoracentesis was performed. The catheter was removed and a dressing applied. FINDINGS: A total of approximately 400 mL of hazy amber fluid was removed. Samples were sent to the laboratory as requested by the clinical team. IMPRESSION: Successful ultrasound guided left thoracentesis yielding 400 mL of pleural fluid. Read by: Earley Abide, PA-C Electronically Signed   By: Jerilynn Mages.  Shick M.D.   On: 11/16/2020 09:44    Cardiac Studies   Echo 11/16/19  1. LVEF slightly decreased from the prior study, now 45-50% with diffuse hypokinesis, otherwise no change from the prior study on 11/07/2020. 2. Left ventricular ejection fraction, by estimation, is 45 to 50%. The left ventricle has mildly decreased function. The left ventricle demonstrates global hypokinesis. 3. Right ventricular systolic function is moderately reduced. The right ventricular size is moderately enlarged. There is mildly elevated pulmonary artery systolic pressure. The estimated right ventricular systolic pressure is 66.5 mmHg. 4. Left atrial size was severely dilated. 5. Right atrial size was moderately dilated. 6. A small pericardial effusion is present. The pericardial effusion is posterior to the left ventricle. Moderate pleural effusion in the left lateral region. 7. Moderate mitral regurgitation however can be underestimated due to eccentric jet. The mitral valve is degenerative. Moderate mitral valve regurgitation. No evidence of mitral stenosis. There is moderate prolapse of multiple segments of the anterior leaflet of the mitral valve. Moderate mitral annular calcification. 8. Tricuspid valve regurgitation is severe. 9. The aortic valve is normal in structure. There is moderate calcification of the aortic valve. There is moderate thickening of the aortic  valve. Aortic valve regurgitation is trivial. Mild to moderate aortic valve sclerosis/calcification is present, without any evidence of aortic stenosis. 10. The inferior vena cava is dilated in size with <50% respiratory variability, suggesting right atrial pressure of 15 mmHg. Echocardiogram 11/07/2020: 1. Left ventricular ejection fraction, by estimation, is 50 to 55%. The  left ventricle has low normal function. The left ventricle has no regional  wall motion abnormalities. There is mild left ventricular hypertrophy.  Left ventricular diastolic  parameters are indeterminate.  2. Right ventricular systolic function is moderately reduced. The right  ventricular size is moderately enlarged. There is mildly elevated  pulmonary artery systolic pressure. The estimated right ventricular  systolic pressure is 99.3 mmHg.  3. Right atrial size was severely dilated.  4. The mitral valve is degenerative. Moderate mitral annular  calcification. No evidence of mitral stenosis. Moderate mitral valve  regurgitation. There is significant prolapse of the anterior leaflet with  posterior directed MR jet that appears moderate  but may be underestimating as eccentric jet and not well visualized on  apical views. Could consider TEE or cardiac MRI for further evaluation  5. Tricuspid valve regurgitation is moderate.  6. The aortic valve is tricuspid. Aortic valve regurgitation is not  visualized. Mild to moderate aortic valve sclerosis/calcification is  present, without any evidence of aortic stenosis.  7. The inferior vena cava is dilated in size with <50% respiratory  variability, suggesting right atrial pressure of 15 mmHg.   Patient Profile     87 y.o. female with a hx of NICM, BiV ICD, atrial fib persistent, high grade AV block with PPM, on amiodarone, hyperthyroidism, anemia and lower ext edema, CKD 3-4who is being seen for the evaluation of acute on chronic diastolic heart failure with  right ventricular  dysfunction and PAF. Of note, PCP noted sCr increased to 1.68 and stopped lasix due to concern for dehydration.  Assessment & Plan    1. Acute on chronic combined CHF: patient presented with LE edema, DOE, and orthopnea in the setting of discontinuation of her lasix by PCP 1 month ago.  history of NICM with EF 25-30% 02/2020 in the setting of V-pacing - She underwent BiV upgrade in July.   IN early November 2021 she was doing good  NO edema  (seen in clinic by Olin Pia)   In Dec 2021 she was seen by Claudina Lick.  Edema present    Lasix increased transiently to 80 then down to 60    She continued to have edema and weakness and brought to ED  Since admit she has been diuresing some but not much   SOme days exam worse than others.   She is a net neg by I/O but wt not much different     Yesterday pacer reprogrammed to DDIR to see if this contributing  Pt put on for thoracentesis today for large pleural effusion WIll add 2.5 mg Zaroxylyn today     2.5 and and continue BID 80 lasix .  >2  Echo this admit showed LVEF was improved from echo pre BiV pacer but RV function down from echo done in Sept 2021   TR appeared more prominent.   Now at least moderate if not severe   QUestion if related to lead implant from BIV  With worsening TR  THis may be the continued problem for pt    2. Paroxysmal atrial fibrillation: device interrogation shows no Afib since 11/08/20 though did have a 5.4% burden 12/23-12/30. Her amiodarone was increased from 100mg  daily to 200mg  BID - Continue metoprolol for rate control - Continue amiodarone for rhythm control - Continue xarelto for stroke ppx WIll continue higher dose for short term and follow as outpt with reinterrogation of pacer   Patient will do better if can maintain SR   3. SSS s/p PPM and then BiV upgrade in 05/2020 :Device was reprogrammed yesterday to [place].  COncern that it was tracking an atrial tach and pacing at upper 90s    NOw, top rate at 70     4. Mitral regurgitation: moderate on echo this admission.  - Continue routine annual outpatient monitoring  5  TR   Eccentric jet   Severe  5. CKD stage III-IV: Cr 1.99  Near baseline  WIll give Zaroxylyn today with lasix       For questions or updates, please contact [place] Please consult [email or web address] for contact info under        Signed, Dorris Carnes, MD  11/17/2020, 5:53 AM

## 2020-11-17 NOTE — Progress Notes (Signed)
Progress Note  [name] Date of Encounter: 11/17/2020  [facility] Cardiologist: Virl Axe, MD   Subjective   Breathing is OK in bed  No CP   Inpatient Medications    Scheduled Meds: . ALPRAZolam  0.5 mg Oral QHS  . amiodarone  200 mg Oral BID  . busPIRone  5 mg Oral BID  . furosemide  80 mg Intravenous BID  . metoprolol tartrate  12.5 mg Oral BID  . potassium chloride  20 mEq Oral BID  . Rivaroxaban  15 mg Oral Q supper  . sodium chloride flush  3 mL Intravenous Q12H   Continuous Infusions: . sodium chloride     PRN Meds: sodium chloride, acetaminophen, lidocaine (PF), ondansetron (ZOFRAN) IV, sodium chloride flush   Vital Signs    Vitals:   11/16/20 1600 11/16/20 1800 11/16/20 2044 11/17/20 0851  BP:   111/73 (!) 97/59  Pulse:   70 71  Resp:   18   Temp:   98.8 F (37.1 C)   TempSrc:   Oral   SpO2: 95% 96% 100%   Weight:      Height:        Intake/Output Summary (Last 24 hours) at 11/17/2020 0940 Last data filed at 11/16/2020 2130 Gross per 24 hour  Intake 360 ml  Output 1700 ml  Net -1340 ml   Net I/O   -3.8 L   Last 3 Weights 11/16/2020 11/15/2020 11/14/2020  Weight (lbs) 147 lb 14.9 oz 148 lb 4.8 oz 148 lb 4.8 oz  Weight (kg) 67.1 kg 67.268 kg 67.268 kg      Telemetry    AV paced with A-sense, V-paced - Personally Reviewed  ECG    No new tracings - Personally Reviewed  Physical Exam   GEN: No acute distress.   Neck: increased JVP  Cardiac: RRR,Gr II-III/VI systolic murmur LSB to apex   Respiratory: Decreased BS at base   GI: Soft, nontender, non-distended  MS: 1+ LE edema; No deformity. Neuro:  Nonfocal  Psych: Normal affect   Labs    High Sensitivity Troponin:  No results for input(s): TROPONINIHS in the last 720 hours.    Chemistry Recent Labs  Lab 11/15/20 0912 11/16/20 0405 11/17/20 0748  NA 139 140 141  K 4.0 4.3 3.5  CL 101 100 95*  CO2 27 30 34*  GLUCOSE 141* 103* 86  BUN 40* 36* 37*  CREATININE  2.18* 1.99* 1.95*  CALCIUM 9.1 9.1 9.0  PROT  --   --  6.0*  ALBUMIN  --   --  2.8*  AST  --   --  53*  ALT  --   --  42  ALKPHOS  --   --  87  BILITOT  --   --  2.3*  GFRNONAA 21* 24* 24*  ANIONGAP 11 10 12      Hematology Recent Labs  Lab 11/14/20 0431 11/15/20 0912 11/16/20 0405  WBC 5.7 4.6 5.3  RBC 3.81* 3.86* 3.84*  HGB 11.1* 10.9* 11.2*  HCT 36.7 37.6 37.2  MCV 96.3 97.4 96.9  MCH 29.1 28.2 29.2  MCHC 30.2 29.0* 30.1  RDW 16.8* 16.7* 16.6*  PLT 132* 139* 131*    BNPNo results for input(s): BNP, PROBNP in the last 168 hours.   DDimer No results for input(s): DDIMER in the last 168 hours.   Radiology    DG Chest 1 View  Result Date: 11/16/2020 CLINICAL DATA:  Status post  left thoracentesis EXAM: CHEST  1 VIEW COMPARISON:  11/15/2020 FINDINGS: Interval decrease of left pleural effusion.  No pneumothorax. Unchanged cardiomegaly. No significant abnormality of the right lung. Linear edge along the inferior aspect of the right lateral lung base is likely a skin fold. Unchanged left chest wall pacemaker. IMPRESSION: No pneumothorax status post left thoracentesis. Electronically Signed   By: Miachel Roux M.D.   On: 11/16/2020 09:30   DG Chest 2 View  Result Date: 11/15/2020 CLINICAL DATA:  Shortness of breath EXAM: CHEST - 2 VIEW COMPARISON:  11/08/2020 FINDINGS: Stable positioning of a left-sided implanted cardiac device. Stable cardiomegaly. Persistent layering bilateral pleural effusions, left slightly greater than right. Mild hazy bibasilar opacities, similar to prior. No pneumothorax. Degenerative changes of the bilateral glenohumeral joints. IMPRESSION: Persistent layering bilateral pleural effusions, left slightly greater than right. Hazy bibasilar opacities, favor atelectasis. Electronically Signed   By: Davina Poke D.O.   On: 11/15/2020 11:33   ECHOCARDIOGRAM LIMITED  Result Date: 11/15/2020    ECHOCARDIOGRAM LIMITED REPORT   Patient Name:   [name] Date of  Exam: 11/15/2020 Medical Rec #:  [id number]      Height:       62.0 in Accession #:    [id number]     Weight:       148.3 lb Date of Birth:  [birth year]      BSA:          1.684 m Patient Age:    64 years       BP:           99/70 mmHg Patient Gender: F              HR:           98 bpm. Exam Location:  Inpatient Procedure: Limited Echo, Limited Color Doppler and Cardiac Doppler Indications:    acute systolic chf  History:        Patient has prior history of Echocardiogram examinations, most                 recent 12/09/2019. Pacemaker, chronic kidney disease,                 Arrythmias:Atrial Fibrillation; Risk Factors:Hypertension.  Sonographer:    Johny Chess Referring Phys: [id number] Madox Corkins V Morine Kohlman IMPRESSIONS  1. LVEF slightly decreased from the prior study, now 45-50% with diffuse hypokinesis, otherwise no change from the prior study on 11/07/2020.  2. Left ventricular ejection fraction, by estimation, is 45 to 50%. The left ventricle has mildly decreased function. The left ventricle demonstrates global hypokinesis.  3. Right ventricular systolic function is moderately reduced. The right ventricular size is moderately enlarged. There is mildly elevated pulmonary artery systolic pressure. The estimated right ventricular systolic pressure is 33.8 mmHg.  4. Left atrial size was severely dilated.  5. Right atrial size was moderately dilated.  6. A small pericardial effusion is present. The pericardial effusion is posterior to the left ventricle. Moderate pleural effusion in the left lateral region.  7. Moderate mitral regurgitation however can be underestimated due to eccentric jet. The mitral valve is degenerative. Moderate mitral valve regurgitation. No evidence of mitral stenosis. There is moderate prolapse of multiple segments of the anterior leaflet of the mitral valve. Moderate mitral annular calcification.  8. Tricuspid valve regurgitation is severe.  9. The aortic valve is normal in structure. There is moderate  calcification of the aortic valve. There is moderate thickening of the aortic valve.  Aortic valve regurgitation is trivial. Mild to moderate aortic valve sclerosis/calcification is present, without any evidence of aortic stenosis. 10. The inferior vena cava is dilated in size with <50% respiratory variability, suggesting right atrial pressure of 15 mmHg. FINDINGS  Left Ventricle: Left ventricular ejection fraction, by estimation, is 45 to 50%. The left ventricle has mildly decreased function. The left ventricle demonstrates global hypokinesis. The left ventricular internal cavity size was normal in size. There is  no left ventricular hypertrophy. Right Ventricle: The right ventricular size is moderately enlarged. No increase in right ventricular wall thickness. Right ventricular systolic function is moderately reduced. There is mildly elevated pulmonary artery systolic pressure. The tricuspid regurgitant velocity is 2.52 m/s, and with an assumed right atrial pressure of 15 mmHg, the estimated right ventricular systolic pressure is 13.0 mmHg. Left Atrium: Left atrial size was severely dilated. Right Atrium: Right atrial size was moderately dilated. Pericardium: A small pericardial effusion is present. The pericardial effusion is posterior to the left ventricle. Mitral Valve: Moderate mitral regurgitation however can be underestimated due to eccentric jet. The mitral valve is degenerative in appearance. There is moderate prolapse of multiple segments of the anterior leaflet of the mitral valve. There is moderate  thickening of the mitral valve leaflet(s). Moderate mitral annular calcification. Moderate mitral valve regurgitation. No evidence of mitral valve stenosis. Tricuspid Valve: The tricuspid valve is normal in structure. Tricuspid valve regurgitation is severe. No evidence of tricuspid stenosis. Aortic Valve: The aortic valve is normal in structure. There is moderate calcification of the aortic valve. There is  moderate thickening of the aortic valve. Aortic valve regurgitation is trivial. Mild to moderate aortic valve sclerosis/calcification is present, without any evidence of aortic stenosis. Pulmonic Valve: The pulmonic valve was normal in structure. Pulmonic valve regurgitation is mild. No evidence of pulmonic stenosis. Aorta: The aortic root is normal in size and structure. Venous: The inferior vena cava is dilated in size with less than 50% respiratory variability, suggesting right atrial pressure of 15 mmHg. IAS/Shunts: No atrial level shunt detected by color flow Doppler. Additional Comments: A pacer wire is visualized in the right atrium and right ventricle. There is a moderate pleural effusion in the left lateral region. RIGHT VENTRICLE            IVC RV S prime:     7.94 cm/s  IVC diam: 2.30 cm TAPSE (M-mode): 1.3 cm AORTIC VALVE LVOT Vmax:   47.60 cm/s LVOT Vmean:  31.200 cm/s LVOT VTI:    0.071 m MR Peak grad:    81.0 mmHg   TRICUSPID VALVE MR Mean grad:    46.0 mmHg   TR Peak grad:   25.4 mmHg MR Vmax:         450.00 cm/s TR Vmax:        252.00 cm/s MR Vmean:        319.0 cm/s MR PISA:         2.26 cm    SHUNTS MR PISA Eff ROA: 20 mm      Systemic VTI: 0.07 m MR PISA Radius:  0.60 cm Ena Dawley MD Electronically signed by Ena Dawley MD Signature Date/Time: 11/15/2020/8:51:23 PM    Final    IR THORACENTESIS ASP PLEURAL SPACE W/IMG GUIDE  Result Date: 11/16/2020 INDICATION: Patient with history of acute on chronic HF, CKD stage III-IV, dyspnea, and bilateral pleural effusions, L>R. Request is made for diagnostic therapeutic left thoracentesis. EXAM: ULTRASOUND GUIDED DIAGNOSTIC AND THERAPEUTIC LEFT THORACENTESIS MEDICATIONS: 8  mL 1% Lidocaine COMPLICATIONS: None immediate. PROCEDURE: An ultrasound guided thoracentesis was thoroughly discussed with the patient and questions answered. The benefits, risks, alternatives and complications were also discussed. The patient understands and wishes to  proceed with the procedure. Written consent was obtained. Ultrasound was performed to localize and mark an adequate pocket of fluid in the left chest. The area was then prepped and draped in the normal sterile fashion. 1% Lidocaine was used for local anesthesia. Under ultrasound guidance a 6 Fr Safe-T-Centesis catheter was introduced. Thoracentesis was performed. The catheter was removed and a dressing applied. FINDINGS: A total of approximately 400 mL of hazy amber fluid was removed. Samples were sent to the laboratory as requested by the clinical team. IMPRESSION: Successful ultrasound guided left thoracentesis yielding 400 mL of pleural fluid. Read by: Earley Abide, PA-C Electronically Signed   By: Jerilynn Mages.  Shick M.D.   On: 11/16/2020 09:44    Cardiac Studies   Echo 11/16/19  1. LVEF slightly decreased from the prior study, now 45-50% with diffuse hypokinesis, otherwise no change from the prior study on 11/07/2020. 2. Left ventricular ejection fraction, by estimation, is 45 to 50%. The left ventricle has mildly decreased function. The left ventricle demonstrates global hypokinesis. 3. Right ventricular systolic function is moderately reduced. The right ventricular size is moderately enlarged. There is mildly elevated pulmonary artery systolic pressure. The estimated right ventricular systolic pressure is 28.7 mmHg. 4. Left atrial size was severely dilated. 5. Right atrial size was moderately dilated. 6. A small pericardial effusion is present. The pericardial effusion is posterior to the left ventricle. Moderate pleural effusion in the left lateral region. 7. Moderate mitral regurgitation however can be underestimated due to eccentric jet. The mitral valve is degenerative. Moderate mitral valve regurgitation. No evidence of mitral stenosis. There is moderate prolapse of multiple segments of the anterior leaflet of the mitral valve. Moderate mitral annular calcification. 8. Tricuspid valve  regurgitation is severe. 9. The aortic valve is normal in structure. There is moderate calcification of the aortic valve. There is moderate thickening of the aortic valve. Aortic valve regurgitation is trivial. Mild to moderate aortic valve sclerosis/calcification is present, without any evidence of aortic stenosis. 10. The inferior vena cava is dilated in size with <50% respiratory variability, suggesting right atrial pressure of 15 mmHg. Echocardiogram 11/07/2020: 1. Left ventricular ejection fraction, by estimation, is 50 to 55%. The  left ventricle has low normal function. The left ventricle has no regional  wall motion abnormalities. There is mild left ventricular hypertrophy.  Left ventricular diastolic  parameters are indeterminate.  2. Right ventricular systolic function is moderately reduced. The right  ventricular size is moderately enlarged. There is mildly elevated  pulmonary artery systolic pressure. The estimated right ventricular  systolic pressure is 68.1 mmHg.  3. Right atrial size was severely dilated.  4. The mitral valve is degenerative. Moderate mitral annular  calcification. No evidence of mitral stenosis. Moderate mitral valve  regurgitation. There is significant prolapse of the anterior leaflet with  posterior directed MR jet that appears moderate  but may be underestimating as eccentric jet and not well visualized on  apical views. Could consider TEE or cardiac MRI for further evaluation  5. Tricuspid valve regurgitation is moderate.  6. The aortic valve is tricuspid. Aortic valve regurgitation is not  visualized. Mild to moderate aortic valve sclerosis/calcification is  present, without any evidence of aortic stenosis.  7. The inferior vena cava is dilated in size with <50% respiratory  variability, suggesting right atrial pressure of 15 mmHg.   Patient Profile     87 y.o. female with a hx of NICM, BiV ICD, atrial fib persistent, high grade AV block  with PPM, on amiodarone, hyperthyroidism, anemia and lower ext edema, CKD 3-4who is being seen for the evaluation of acute on chronic diastolic heart failure with right ventricular dysfunction and PAF. Of note, PCP noted sCr increased to 1.68 and stopped lasix due to concern for dehydration.  Assessment & Plan    1. Acute systolic CHF   Pt with hx of systolic CHF " from pacing   Underwent BIV upgrade in July 2021  Seen in NOvember    Doing good   IN Dec edema   Lasix adjusted   Admitted with worsing ing edema    SInce admit she has diuresed some  Pacer reprogrammed to [place] since it appearted to be tracking atrial rhythm  Pt underwent thoracentesis yesterday for 400 cc pleural fluids Review of echo:  Pt has severe TR   May be from pacer wire.   JVP increased ; LE present as well    Not sure will be able to optimize fluids further Pt is very fragile   WIll need oral dose of lasix  80 daily     2. Paroxysmal atrial fibrillation: device interrogation shows no Afib since 11/08/20 though did have a 5.4% burden 12/23-12/30. Her amiodarone was increased from 100mg  daily to 200mg  BID - - Continue amiodarone for rhythm control - Continue xarelto for stroke ppx Pulled b blocker off as BP low  WIll continue higher dose for short term and follow as outpt with reinterrogation of pacer   Patient will do better if can maintain SR   3. SSS s/p PPM and then BiV upgrade in 05/2020 :Device was reprogrammed  to [place].  COncern that it was tracking an atrial tach and pacing at upper 90s    NOw, top rate at 70  Pacer wire may be interfering with TV leaflet leading to TR      4. Mitral regurgitation: moderate on echo this admission.  -  5  TR   Eccentric jet   Severe  See comments above  WIll review with EP   5. CKD stage III-IV: Cr 1.95       For questions or updates, please contact [place] Please consult [email or web address] for contact info under        Signed, Dorris Carnes, MD  11/17/2020, 9:40 AM

## 2020-11-17 NOTE — Progress Notes (Signed)
Progress Note    CALIANNA KIM  HUT:[id number] DOB: 1929/12/11  DOA: 11/06/2020 PCP: Chesley Noon, MD    Brief Narrative:    Medical records reviewed and are as summarized below:  [name] is an 87 y.o. female with medical history significant of A.Fib on amiodarone and Xarelto, HTN, SSS s/p PPM.  CHF, CKD. Pt with h/o dCHF previously, echo in April 2021 showed newly reduced EF 25-30%, cards suspected this possibly due to her being paced 100% of the time.  Looks like adjustments were made, repeat echo in Sept 2021 showed normal EF. Last month her PCP noted that creat had increased to 1.68, was concerned that she was dehydrated and so stopped her lasix. Since that time she has had worsening edema now progressed to anasarca, SOB, DOE, orthopnea. Seen in cards office and had labs drawn last week on 12/23.  Cards put her on lasix 80mg  daily for 3 days then back to 60mg  daily.  Slow to improve with treatment of presumed UTI and adjustment of rate controlling meds and diuretics   Assessment/Plan:   Principal Problem:   CKD (chronic kidney disease) stage 3, GFR 30-59 ml/min (HCC) Active Problems:   Hypertension   Sick sinus syndrome (HCC)   Paroxysmal atrial fibrillation (HCC)   Biventricular cardiac pacemaker in situ   Acute on chronic combined systolic and diastolic CHF (congestive heart failure) (HCC)   AKI (acute kidney injury) ([place])   Dyspnea   Tricuspid valve regurgitation    Acute on chronic diastolic congestive heart failure/left pleural effusion: Cardiology on board. history of NICM with EF 25-30% 02/2020 in the setting of V-pacing - She underwent BiV upgrade in July.   IN early November 2021 she was doing good  NO edema  (seen in clinic by Olin Pia)   In Dec 2021 she was seen by Claudina Lick.. Patient felt much better after thoracentesis on 11/16/2020.  She is feeling better but is still requiring 2 L of oxygen and has crackles at the bases bilaterally with pitting edema  in lower extremities.  Cardiology has switched her to oral Lasix.  After my discussion with Dr. Harrington Challenger, she switched her back to IV Lasix.  Fluid balance net negative about 5.1 L so far. Appreciate cardiology help and management deferred to them.  Left pleural effusion: Status post thoracentesis with 400 mL of hazy amber fluid on 11/16/2020.  AKI on CKD 3b -creatinine has remained fairly stable around 2.1 since last 4-5 days but has improved a little bit again today.  Monitor closely.  SSS and biventricular PPM -  -follows with Dr. Caryl Comes  HTN -Controlled.  Continue low-dose metoprolol.  PAF -remains on amiodarone 200 mg p.o. twice daily as well as Xarelto.  Managed by cardiology.  ? UTI -present on admission U/A -dose of IV rocephin x3 days- completed 1/4   Family Communication/Anticipated D/C date and plan/Code Status   DVT prophylaxis: xarelto Code Status: Full Code.  Disposition Plan: Status is: inpt Family communication: Daughter at the bedside, discussed with her. The patient will require care spanning > 2 midnights and should be moved to inpatient because: Inpatient level of care appropriate due to severity of illness  Dispo: The patient is from: Home              Anticipated d/c is to: Home              Anticipated d/c date is: 1 to 2 days  Patient currently is not medically stable to d/c.           Medical Consultants:    cards     Subjective:  Seen and examined.  Despite of requiring 2 L of oxygen and crackles on the lung exam, she denied any chest pain or shortness of breath.  She would like to go home as soon as possible.  Objective:    Vitals:   11/16/20 1600 11/16/20 1800 11/16/20 2044 11/17/20 0851  BP:   111/73 (!) 97/59  Pulse:   70 71  Resp:   18   Temp:   98.8 F (37.1 C)   TempSrc:   Oral   SpO2: 95% 96% 100%   Weight:      Height:        Intake/Output Summary (Last 24 hours) at 11/17/2020 1156 Last data filed at 11/16/2020  2130 Gross per 24 hour  Intake 360 ml  Output 1700 ml  Net -1340 ml   Filed Weights   11/14/20 0404 11/15/20 0608 11/16/20 0440  Weight: 67.3 kg 67.3 kg 67.1 kg    Exam:  General exam: Appears calm and comfortable  Respiratory system: Crackles at the bases bilaterally. Respiratory effort normal. Cardiovascular system: S1 & S2 heard, RRR. No JVD, murmurs, rubs, gallops or clicks.  +2 pitting edema right lower extremity and +1 pitting edema left lower extremity Gastrointestinal system: Abdomen is nondistended, soft and nontender. No organomegaly or masses felt. Normal bowel sounds heard. Central nervous system: Alert and oriented. No focal neurological deficits. Extremities: Symmetric 5 x 5 power. Skin: No rashes, lesions or ulcers.  Psychiatry: Judgement and insight appear normal. Mood & affect appropriate.    Data Reviewed:   I have personally reviewed following labs and imaging studies:  Labs: Labs show the following:   Basic Metabolic Panel: Recent Labs  Lab 11/12/20 0213 11/14/20 0431 11/15/20 0912 11/16/20 0405 11/17/20 0748  NA 141 141 139 140 141  K 4.7 4.7 4.0 4.3 3.5  CL 103 105 101 100 95*  CO2 27 24 27 30  34*  GLUCOSE 122* 87 141* 103* 86  BUN 40* 39* 40* 36* 37*  CREATININE 2.02* 2.09* 2.18* 1.99* 1.95*  CALCIUM 9.4 9.1 9.1 9.1 9.0  MG 1.9  --  1.8 1.8 1.8   GFR Estimated Creatinine Clearance: 17.9 mL/min (A) (by C-G formula based on SCr of 1.95 mg/dL (H)). Liver Function Tests: Recent Labs  Lab 11/17/20 0748  AST 53*  ALT 42  ALKPHOS 87  BILITOT 2.3*  PROT 6.0*  ALBUMIN 2.8*   No results for input(s): LIPASE, AMYLASE in the last 168 hours. No results for input(s): AMMONIA in the last 168 hours. Coagulation profile No results for input(s): INR, PROTIME in the last 168 hours.  CBC: Recent Labs  Lab 11/11/20 0233 11/14/20 0431 11/15/20 0912 11/16/20 0405  WBC 4.2 5.7 4.6 5.3  NEUTROABS  --   --  3.4 3.7  HGB 10.8* 11.1* 10.9* 11.2*   HCT 37.3 36.7 37.6 37.2  MCV 97.4 96.3 97.4 96.9  PLT 126* 132* 139* 131*   Cardiac Enzymes: No results for input(s): CKTOTAL, CKMB, CKMBINDEX, TROPONINI in the last 168 hours. BNP (last 3 results) Recent Labs    03/02/20 0930  PROBNP 3,278*   CBG: No results for input(s): GLUCAP in the last 168 hours. D-Dimer: No results for input(s): DDIMER in the last 72 hours. Hgb A1c: No results for input(s): HGBA1C in the last 72 hours. Lipid  Profile: No results for input(s): CHOL, HDL, LDLCALC, TRIG, CHOLHDL, LDLDIRECT in the last 72 hours. Thyroid function studies: No results for input(s): TSH, T4TOTAL, T3FREE, THYROIDAB in the last 72 hours.  Invalid input(s): FREET3 Anemia work up: No results for input(s): VITAMINB12, FOLATE, FERRITIN, TIBC, IRON, RETICCTPCT in the last 72 hours. Sepsis Labs: Recent Labs  Lab 11/11/20 0233 11/14/20 0431 11/15/20 0912 11/16/20 0405  WBC 4.2 5.7 4.6 5.3    Microbiology No results found for this or any previous visit (from the past 240 hour(s)).  Procedures and diagnostic studies:  DG Chest 1 View  Result Date: 11/16/2020 CLINICAL DATA:  Status post left thoracentesis EXAM: CHEST  1 VIEW COMPARISON:  11/15/2020 FINDINGS: Interval decrease of left pleural effusion.  No pneumothorax. Unchanged cardiomegaly. No significant abnormality of the right lung. Linear edge along the inferior aspect of the right lateral lung base is likely a skin fold. Unchanged left chest wall pacemaker. IMPRESSION: No pneumothorax status post left thoracentesis. Electronically Signed   By: Miachel Roux M.D.   On: 11/16/2020 09:30   ECHOCARDIOGRAM LIMITED  Result Date: 11/15/2020    ECHOCARDIOGRAM LIMITED REPORT   Patient Name:   GRICEL COPEN Date of Exam: 11/15/2020 Medical Rec #:  [id number]      Height:       62.0 in Accession #:    [id number]     Weight:       148.3 lb Date of Birth:  October 09, 1930      BSA:          1.684 m Patient Age:    37 years       BP:            99/70 mmHg Patient Gender: F              HR:           98 bpm. Exam Location:  Inpatient Procedure: Limited Echo, Limited Color Doppler and Cardiac Doppler Indications:    acute systolic chf  History:        Patient has prior history of Echocardiogram examinations, most                 recent 12/09/2019. Pacemaker, chronic kidney disease,                 Arrythmias:Atrial Fibrillation; Risk Factors:Hypertension.  Sonographer:    Johny Chess Referring Phys: [id number] PAULA V ROSS IMPRESSIONS  1. LVEF slightly decreased from the prior study, now 45-50% with diffuse hypokinesis, otherwise no change from the prior study on 11/07/2020.  2. Left ventricular ejection fraction, by estimation, is 45 to 50%. The left ventricle has mildly decreased function. The left ventricle demonstrates global hypokinesis.  3. Right ventricular systolic function is moderately reduced. The right ventricular size is moderately enlarged. There is mildly elevated pulmonary artery systolic pressure. The estimated right ventricular systolic pressure is 06.2 mmHg.  4. Left atrial size was severely dilated.  5. Right atrial size was moderately dilated.  6. A small pericardial effusion is present. The pericardial effusion is posterior to the left ventricle. Moderate pleural effusion in the left lateral region.  7. Moderate mitral regurgitation however can be underestimated due to eccentric jet. The mitral valve is degenerative. Moderate mitral valve regurgitation. No evidence of mitral stenosis. There is moderate prolapse of multiple segments of the anterior leaflet of the mitral valve. Moderate mitral annular calcification.  8. Tricuspid valve regurgitation is severe.  9. The aortic valve is  normal in structure. There is moderate calcification of the aortic valve. There is moderate thickening of the aortic valve. Aortic valve regurgitation is trivial. Mild to moderate aortic valve sclerosis/calcification is present, without any evidence of aortic  stenosis. 10. The inferior vena cava is dilated in size with <50% respiratory variability, suggesting right atrial pressure of 15 mmHg. FINDINGS  Left Ventricle: Left ventricular ejection fraction, by estimation, is 45 to 50%. The left ventricle has mildly decreased function. The left ventricle demonstrates global hypokinesis. The left ventricular internal cavity size was normal in size. There is  no left ventricular hypertrophy. Right Ventricle: The right ventricular size is moderately enlarged. No increase in right ventricular wall thickness. Right ventricular systolic function is moderately reduced. There is mildly elevated pulmonary artery systolic pressure. The tricuspid regurgitant velocity is 2.52 m/s, and with an assumed right atrial pressure of 15 mmHg, the estimated right ventricular systolic pressure is 71.6 mmHg. Left Atrium: Left atrial size was severely dilated. Right Atrium: Right atrial size was moderately dilated. Pericardium: A small pericardial effusion is present. The pericardial effusion is posterior to the left ventricle. Mitral Valve: Moderate mitral regurgitation however can be underestimated due to eccentric jet. The mitral valve is degenerative in appearance. There is moderate prolapse of multiple segments of the anterior leaflet of the mitral valve. There is moderate  thickening of the mitral valve leaflet(s). Moderate mitral annular calcification. Moderate mitral valve regurgitation. No evidence of mitral valve stenosis. Tricuspid Valve: The tricuspid valve is normal in structure. Tricuspid valve regurgitation is severe. No evidence of tricuspid stenosis. Aortic Valve: The aortic valve is normal in structure. There is moderate calcification of the aortic valve. There is moderate thickening of the aortic valve. Aortic valve regurgitation is trivial. Mild to moderate aortic valve sclerosis/calcification is present, without any evidence of aortic stenosis. Pulmonic Valve: The pulmonic valve  was normal in structure. Pulmonic valve regurgitation is mild. No evidence of pulmonic stenosis. Aorta: The aortic root is normal in size and structure. Venous: The inferior vena cava is dilated in size with less than 50% respiratory variability, suggesting right atrial pressure of 15 mmHg. IAS/Shunts: No atrial level shunt detected by color flow Doppler. Additional Comments: A pacer wire is visualized in the right atrium and right ventricle. There is a moderate pleural effusion in the left lateral region. RIGHT VENTRICLE            IVC RV S prime:     7.94 cm/s  IVC diam: 2.30 cm TAPSE (M-mode): 1.3 cm AORTIC VALVE LVOT Vmax:   47.60 cm/s LVOT Vmean:  31.200 cm/s LVOT VTI:    0.071 m MR Peak grad:    81.0 mmHg   TRICUSPID VALVE MR Mean grad:    46.0 mmHg   TR Peak grad:   25.4 mmHg MR Vmax:         450.00 cm/s TR Vmax:        252.00 cm/s MR Vmean:        319.0 cm/s MR PISA:         2.26 cm    SHUNTS MR PISA Eff ROA: 20 mm      Systemic VTI: 0.07 m MR PISA Radius:  0.60 cm Ena Dawley MD Electronically signed by Ena Dawley MD Signature Date/Time: 11/15/2020/8:51:23 PM    Final    IR THORACENTESIS ASP PLEURAL SPACE W/IMG GUIDE  Result Date: 11/16/2020 INDICATION: Patient with history of acute on chronic HF, CKD stage III-IV, dyspnea, and bilateral pleural  effusions, L>R. Request is made for diagnostic therapeutic left thoracentesis. EXAM: ULTRASOUND GUIDED DIAGNOSTIC AND THERAPEUTIC LEFT THORACENTESIS MEDICATIONS: 8 mL 1% Lidocaine COMPLICATIONS: None immediate. PROCEDURE: An ultrasound guided thoracentesis was thoroughly discussed with the patient and questions answered. The benefits, risks, alternatives and complications were also discussed. The patient understands and wishes to proceed with the procedure. Written consent was obtained. Ultrasound was performed to localize and mark an adequate pocket of fluid in the left chest. The area was then prepped and draped in the normal sterile fashion. 1%  Lidocaine was used for local anesthesia. Under ultrasound guidance a 6 Fr Safe-T-Centesis catheter was introduced. Thoracentesis was performed. The catheter was removed and a dressing applied. FINDINGS: A total of approximately 400 mL of hazy amber fluid was removed. Samples were sent to the laboratory as requested by the clinical team. IMPRESSION: Successful ultrasound guided left thoracentesis yielding 400 mL of pleural fluid. Read by: Earley Abide, PA-C Electronically Signed   By: Jerilynn Mages.  Shick M.D.   On: 11/16/2020 09:44    Medications:   . ALPRAZolam  0.5 mg Oral QHS  . amiodarone  200 mg Oral BID  . busPIRone  5 mg Oral BID  . furosemide  80 mg Intravenous Daily  . potassium chloride  20 mEq Oral Daily  . Rivaroxaban  15 mg Oral Q supper  . sodium chloride flush  3 mL Intravenous Q12H   Continuous Infusions: . sodium chloride       LOS: 9 days   Darliss Cheney  [facility]   How to contact the [facility] Attending or Consulting provider [place] or covering provider during after hours [place], for this patient?  1. Check the care team in [facility] and look for a) attending/consulting [facility] provider listed and b) the [facility] team listed 2. Log into [email or web address] and use [place]'s universal password to access. If you do not have the password, please contact the hospital operator. 3. Locate the [facility] provider you are looking for under [facility] and page to a number that you can be directly reached. 4. If you still have difficulty reaching the provider, please page the [facility] (Director on Call) for the Hospitalists listed on amion for assistance.  11/17/2020, 11:56 AM

## 2020-11-18 ENCOUNTER — Inpatient Hospital Stay (HOSPITAL_COMMUNITY): Payer: Medicare Other

## 2020-11-18 DIAGNOSIS — L8990 Pressure ulcer of unspecified site, unspecified stage: Secondary | ICD-10-CM | POA: Insufficient documentation

## 2020-11-18 DIAGNOSIS — N1832 Chronic kidney disease, stage 3b: Secondary | ICD-10-CM | POA: Diagnosis not present

## 2020-11-18 DIAGNOSIS — I5043 Acute on chronic combined systolic (congestive) and diastolic (congestive) heart failure: Secondary | ICD-10-CM | POA: Diagnosis not present

## 2020-11-18 LAB — BASIC METABOLIC PANEL
Anion gap: 13 (ref 5–15)
BUN: 36 mg/dL — ABNORMAL HIGH (ref 8–23)
CO2: 33 mmol/L — ABNORMAL HIGH (ref 22–32)
Calcium: 9.3 mg/dL (ref 8.9–10.3)
Chloride: 93 mmol/L — ABNORMAL LOW (ref 98–111)
Creatinine, Ser: 1.78 mg/dL — ABNORMAL HIGH (ref 0.44–1.00)
GFR, Estimated: 27 mL/min — ABNORMAL LOW (ref >60)
Glucose, Bld: 94 mg/dL (ref 70–99)
Potassium: 2.8 mmol/L — ABNORMAL LOW (ref 3.5–5.1)
Sodium: 139 mmol/L (ref 135–145)

## 2020-11-18 MED ORDER — POTASSIUM CHLORIDE CRYS ER 20 MEQ PO TBCR
40.0000 meq | EXTENDED_RELEASE_TABLET | ORAL | Status: AC
  Administered 2020-11-18 (×2): 40 meq via ORAL
  Filled 2020-11-18 (×2): qty 2

## 2020-11-18 NOTE — Progress Notes (Signed)
Progress Note  [name] Date of Encounter: 11/18/2020  [facility] Cardiologist: Virl Axe, MD   Subjective   Pt with FM on for O2 due to nose bleed  Comfortable in bed   No CP   Inpatient Medications    Scheduled Meds: . ALPRAZolam  0.5 mg Oral QHS  . amiodarone  200 mg Oral BID  . bisacodyl  10 mg Rectal Once  . busPIRone  5 mg Oral BID  . furosemide  80 mg Intravenous Daily  . potassium chloride  20 mEq Oral Daily  . Rivaroxaban  15 mg Oral Q supper  . sodium chloride flush  3 mL Intravenous Q12H   Continuous Infusions: . sodium chloride     PRN Meds: sodium chloride, acetaminophen, lidocaine (PF), ondansetron (ZOFRAN) IV, sodium chloride flush   Vital Signs    Vitals:   11/16/20 2044 11/17/20 0851 11/17/20 1956 11/18/20 0549  BP: 111/73 (!) 97/59 111/67 105/66  Pulse: 70 71 75 70  Resp: 18  20 18   Temp: 98.8 F (37.1 C)  98.8 F (37.1 C) 97.9 F (36.6 C)  TempSrc: Oral  Oral Oral  SpO2: 100%   93%  Weight:      Height:        Intake/Output Summary (Last 24 hours) at 11/18/2020 8119 Last data filed at 11/18/2020 0143 Gross per 24 hour  Intake --  Output 1850 ml  Net -1850 ml   Net I/O   -3.8 L   Last 3 Weights 11/16/2020 11/15/2020 11/14/2020  Weight (lbs) 147 lb 14.9 oz 148 lb 4.8 oz 148 lb 4.8 oz  Weight (kg) 67.1 kg 67.268 kg 67.268 kg      Telemetry    AV paced   70 - Personally Reviewed  ECG    No new tracings - Personally Reviewed  Physical Exam   GEN: No acute distress.   Neck: increased JVP  Cardiac: RRR,Gr II-III/VI systolic murmur LSB to apex   Respiratory: Decreased BS at R base  GI: Soft, nontender, non-distended  MS: No LE  edema; No deformity. Neuro:  Nonfocal  Psych: Normal affect   Labs    High Sensitivity Troponin:  No results for input(s): TROPONINIHS in the last 720 hours.    Chemistry Recent Labs  Lab 11/15/20 0912 11/16/20 0405 11/17/20 0748  NA 139 140 141  K 4.0 4.3 3.5  CL 101 100  95*  CO2 27 30 34*  GLUCOSE 141* 103* 86  BUN 40* 36* 37*  CREATININE 2.18* 1.99* 1.95*  CALCIUM 9.1 9.1 9.0  PROT  --   --  6.0*  ALBUMIN  --   --  2.8*  AST  --   --  53*  ALT  --   --  42  ALKPHOS  --   --  87  BILITOT  --   --  2.3*  GFRNONAA 21* 24* 24*  ANIONGAP 11 10 12      Hematology Recent Labs  Lab 11/14/20 0431 11/15/20 0912 11/16/20 0405  WBC 5.7 4.6 5.3  RBC 3.81* 3.86* 3.84*  HGB 11.1* 10.9* 11.2*  HCT 36.7 37.6 37.2  MCV 96.3 97.4 96.9  MCH 29.1 28.2 29.2  MCHC 30.2 29.0* 30.1  RDW 16.8* 16.7* 16.6*  PLT 132* 139* 131*    BNPNo results for input(s): BNP, PROBNP in the last 168 hours.   DDimer No results for input(s): DDIMER in the last 168 hours.   Radiology  DG Chest 1 View  Result Date: 11/16/2020 CLINICAL DATA:  Status post left thoracentesis EXAM: CHEST  1 VIEW COMPARISON:  11/15/2020 FINDINGS: Interval decrease of left pleural effusion.  No pneumothorax. Unchanged cardiomegaly. No significant abnormality of the right lung. Linear edge along the inferior aspect of the right lateral lung base is likely a skin fold. Unchanged left chest wall pacemaker. IMPRESSION: No pneumothorax status post left thoracentesis. Electronically Signed   By: Miachel Roux M.D.   On: 11/16/2020 09:30   IR THORACENTESIS ASP PLEURAL SPACE W/IMG GUIDE  Result Date: 11/16/2020 INDICATION: Patient with history of acute on chronic HF, CKD stage III-IV, dyspnea, and bilateral pleural effusions, L>R. Request is made for diagnostic therapeutic left thoracentesis. EXAM: ULTRASOUND GUIDED DIAGNOSTIC AND THERAPEUTIC LEFT THORACENTESIS MEDICATIONS: 8 mL 1% Lidocaine COMPLICATIONS: None immediate. PROCEDURE: An ultrasound guided thoracentesis was thoroughly discussed with the patient and questions answered. The benefits, risks, alternatives and complications were also discussed. The patient understands and wishes to proceed with the procedure. Written consent was obtained. Ultrasound was  performed to localize and mark an adequate pocket of fluid in the left chest. The area was then prepped and draped in the normal sterile fashion. 1% Lidocaine was used for local anesthesia. Under ultrasound guidance a 6 Fr Safe-T-Centesis catheter was introduced. Thoracentesis was performed. The catheter was removed and a dressing applied. FINDINGS: A total of approximately 400 mL of hazy amber fluid was removed. Samples were sent to the laboratory as requested by the clinical team. IMPRESSION: Successful ultrasound guided left thoracentesis yielding 400 mL of pleural fluid. Read by: Earley Abide, PA-C Electronically Signed   By: Jerilynn Mages.  Shick M.D.   On: 11/16/2020 09:44    Cardiac Studies   Echo 11/16/19  1. LVEF slightly decreased from the prior study, now 45-50% with diffuse hypokinesis, otherwise no change from the prior study on 11/07/2020. 2. Left ventricular ejection fraction, by estimation, is 45 to 50%. The left ventricle has mildly decreased function. The left ventricle demonstrates global hypokinesis. 3. Right ventricular systolic function is moderately reduced. The right ventricular size is moderately enlarged. There is mildly elevated pulmonary artery systolic pressure. The estimated right ventricular systolic pressure is 97.9 mmHg. 4. Left atrial size was severely dilated. 5. Right atrial size was moderately dilated. 6. A small pericardial effusion is present. The pericardial effusion is posterior to the left ventricle. Moderate pleural effusion in the left lateral region. 7. Moderate mitral regurgitation however can be underestimated due to eccentric jet. The mitral valve is degenerative. Moderate mitral valve regurgitation. No evidence of mitral stenosis. There is moderate prolapse of multiple segments of the anterior leaflet of the mitral valve. Moderate mitral annular calcification. 8. Tricuspid valve regurgitation is severe. 9. The aortic valve is normal in structure. There is  moderate calcification of the aortic valve. There is moderate thickening of the aortic valve. Aortic valve regurgitation is trivial. Mild to moderate aortic valve sclerosis/calcification is present, without any evidence of aortic stenosis. 10. The inferior vena cava is dilated in size with <50% respiratory variability, suggesting right atrial pressure of 15 mmHg. Echocardiogram 11/07/2020: 1. Left ventricular ejection fraction, by estimation, is 50 to 55%. The  left ventricle has low normal function. The left ventricle has no regional  wall motion abnormalities. There is mild left ventricular hypertrophy.  Left ventricular diastolic  parameters are indeterminate.  2. Right ventricular systolic function is moderately reduced. The right  ventricular size is moderately enlarged. There is mildly elevated  pulmonary artery  systolic pressure. The estimated right ventricular  systolic pressure is 28.3 mmHg.  3. Right atrial size was severely dilated.  4. The mitral valve is degenerative. Moderate mitral annular  calcification. No evidence of mitral stenosis. Moderate mitral valve  regurgitation. There is significant prolapse of the anterior leaflet with  posterior directed MR jet that appears moderate  but may be underestimating as eccentric jet and not well visualized on  apical views. Could consider TEE or cardiac MRI for further evaluation  5. Tricuspid valve regurgitation is moderate.  6. The aortic valve is tricuspid. Aortic valve regurgitation is not  visualized. Mild to moderate aortic valve sclerosis/calcification is  present, without any evidence of aortic stenosis.  7. The inferior vena cava is dilated in size with <50% respiratory  variability, suggesting right atrial pressure of 15 mmHg.   Patient Profile     87 y.o. female with a hx of NICM, BiV ICD, atrial fib persistent, high grade AV block with PPM, on amiodarone, hyperthyroidism, anemia and lower ext edema, CKD  3-4who is being seen for the evaluation of acute on chronic diastolic heart failure with right ventricular dysfunction and PAF. Of note, PCP noted sCr increased to 1.68 and stopped lasix due to concern for dehydration.  Assessment & Plan    1. Acute systolic CHF   Pt with hx of systolic CHF " from pacing   Underwent BIV upgrade in July 2021  Seen in NOvember    Doing good   IN Dec edema   Lasix adjusted   Admitted with worsing ing edema    SInce admit she has diuresed some  Pacer reprogrammed to Page since it appearted to be tracking atrial rhythm  Pt underwent thoracentesis yesterday for 400 cc pleural fluids Review of echo:  Pt has severe TR   May be from pacer wire.   JVP increased ; LE present as well    Not sure will be able to optimize fluids further Note she is getting IV lasix today    Would like to see in chair sitting later today     2. Paroxysmal atrial fibrillation: device interrogation shows no Afib since 11/08/20 though did have a 5.4% burden 12/23-12/30. Her amiodarone was increased from 100mg  daily to 200mg  BID - - Continue amiodarone for rhythm control  Higher dose now the ndecreased at some point when gets a load   Want to keep in SR   - Continue xarelto for stroke ppx Pulled b blocker off as BP low     3. SSS s/p PPM and then BiV upgrade in 05/2020 :Device was reprogrammed  to Page.  COncern that it was tracking an atrial tach and pacing at upper 90s    NOw, top rate at 70  Pacer wire may be interfering with TV leaflet leading to TR   This explains JVP and LE edema.     4. Mitral regurgitation: moderate on echo this admission.  -  5  TR   Eccentric jet   Severe  See comments above  WIll review with EP   5. CKD stage III-IV: Cr 1.78      For questions or updates, please contact [place] Please consult [email or web address] for contact info under        Signed, Dorris Carnes, MD  11/18/2020, 7:12 AM

## 2020-11-18 NOTE — Progress Notes (Addendum)
Progress Note    [name]  KYH:[id number] DOB: Mar 10, 1930  DOA: 11/06/2020 PCP: Chesley Noon, MD    Brief Narrative:    Medical records reviewed and are as summarized below:  [name] is an 87 y.o. female with medical history significant of A.Fib on amiodarone and Xarelto, HTN, SSS s/p PPM.  CHF, CKD. Pt with h/o dCHF previously, echo in April 2021 showed newly reduced EF 25-30%, cards suspected this possibly due to her being paced 100% of the time.  Looks like adjustments were made, repeat echo in Sept 2021 showed normal EF. Last month her PCP noted that creat had increased to 1.68, was concerned that she was dehydrated and so stopped her lasix. Since that time she has had worsening edema now progressed to anasarca, SOB, DOE, orthopnea. Seen in cards office and had labs drawn last week on 12/23.  Cards put her on lasix 80mg  daily for 3 days then back to 60mg  daily.  Slow to improve with treatment of presumed UTI and adjustment of rate controlling meds and diuretics   Assessment/Plan:   Principal Problem:   CKD (chronic kidney disease) stage 3, GFR 30-59 ml/min (HCC) Active Problems:   Hypertension   Sick sinus syndrome (HCC)   Paroxysmal atrial fibrillation (HCC)   Biventricular cardiac pacemaker in situ   Acute on chronic combined systolic and diastolic CHF (congestive heart failure) (HCC)   AKI (acute kidney injury) ([facility])   Dyspnea   Tricuspid valve regurgitation   Pressure injury of skin    Acute on chronic diastolic congestive heart failure/left pleural effusion: Cardiology on board. history of NICM with EF 25-30% 02/2020 in the setting of V-pacing - She underwent BiV upgrade in July.   IN early November 2021 she was doing good  NO edema  (seen in clinic by Olin Pia)   In Dec 2021 she was seen by Claudina Lick.. Patient felt much better after thoracentesis on 11/16/2020.  She is feeling better but is still requiring 2-3 L of oxygen and still has crackles at the  bases bilaterally with pitting edema in lower extremities.  Has positive JVD but per cardiology, this is due to her severe TR and would never go away.  Has been challenging patient so far.  Continues to be on IV Lasix.  Cardiology managing primarily.  Hypokalemia: 2.8 potassium.  Will replace.  Recheck in the morning.  Left pleural effusion: Status post thoracentesis with 400 mL of hazy amber fluid on 11/16/2020.  AKI on CKD 3b -Baseline around 1.7 creatinine.  Came in with 1.9 which peaked to 2.4 and has started to improve.  SSS and biventricular PPM -  -follows with Dr. Caryl Comes  HTN -Controlled.  Continue low-dose metoprolol.  PAF -remains on amiodarone 200 mg p.o. twice daily as well as Xarelto.  Managed by cardiology.  ? UTI -present on admission U/A -dose of IV rocephin x3 days- completed 1/4   Family Communication/Anticipated D/C date and plan/Code Status   DVT prophylaxis: xarelto Code Status: Full Code.  Disposition Plan: Status is: inpt Family communication: None.  Discussed with patient herself. The patient will require care spanning > 2 midnights and should be moved to inpatient because: Inpatient level of care appropriate due to severity of illness  Dispo: The patient is from: Home              Anticipated d/c is to: Home  Anticipated d/c date is: 1 to 2 days              Patient currently is not medically stable to d/c.           Medical Consultants:    cards     Subjective:  Seen and examined.  Pleasant as always.  Denies any shortness of breath.  Now on facemask due to nosebleed which spontaneously stopped but requiring only 2 L of oxygen.  Objective:    Vitals:   11/17/20 0851 11/17/20 1956 11/18/20 0549 11/18/20 1000  BP: (!) 97/59 111/67 105/66 114/69  Pulse: 71 75 70 70  Resp:  20 18 20   Temp:  98.8 F (37.1 C) 97.9 F (36.6 C) (!) 97.2 F (36.2 C)  TempSrc:  Oral Oral Oral  SpO2:   93% 98%  Weight:      Height:         Intake/Output Summary (Last 24 hours) at 11/18/2020 1306 Last data filed at 11/18/2020 0143 Gross per 24 hour  Intake --  Output 750 ml  Net -750 ml   Filed Weights   11/14/20 0404 11/15/20 0608 11/16/20 0440  Weight: 67.3 kg 67.3 kg 67.1 kg    Exam:  General exam: Appears calm and comfortable  Respiratory system: Crackles bilaterally. Respiratory effort normal. Cardiovascular system: S1 & S2 heard, RRR. No JVD, murmurs, rubs, gallops or clicks.  +1-2 pitting edema bilateral lower extremity positive JVD Gastrointestinal system: Abdomen is nondistended, soft and nontender. No organomegaly or masses felt. Normal bowel sounds heard. Central nervous system: Alert and oriented. No focal neurological deficits. Extremities: Symmetric 5 x 5 power. Skin: No rashes, lesions or ulcers.  Psychiatry: Judgement and insight appear normal. Mood & affect appropriate.   Data Reviewed:   I have personally reviewed following labs and imaging studies:  Labs: Labs show the following:   Basic Metabolic Panel: Recent Labs  Lab 11/12/20 0213 11/14/20 0431 11/15/20 0912 11/16/20 0405 11/17/20 0748 11/18/20 0805  NA 141 141 139 140 141 139  K 4.7 4.7 4.0 4.3 3.5 2.8*  CL 103 105 101 100 95* 93*  CO2 27 24 27 30  34* 33*  GLUCOSE 122* 87 141* 103* 86 94  BUN 40* 39* 40* 36* 37* 36*  CREATININE 2.02* 2.09* 2.18* 1.99* 1.95* 1.78*  CALCIUM 9.4 9.1 9.1 9.1 9.0 9.3  MG 1.9  --  1.8 1.8 1.8  --    GFR Estimated Creatinine Clearance: 19.6 mL/min (A) (by C-G formula based on SCr of 1.78 mg/dL (H)). Liver Function Tests: Recent Labs  Lab 11/17/20 0748  AST 53*  ALT 42  ALKPHOS 87  BILITOT 2.3*  PROT 6.0*  ALBUMIN 2.8*   No results for input(s): LIPASE, AMYLASE in the last 168 hours. No results for input(s): AMMONIA in the last 168 hours. Coagulation profile No results for input(s): INR, PROTIME in the last 168 hours.  CBC: Recent Labs  Lab 11/14/20 0431 11/15/20 0912  11/16/20 0405  WBC 5.7 4.6 5.3  NEUTROABS  --  3.4 3.7  HGB 11.1* 10.9* 11.2*  HCT 36.7 37.6 37.2  MCV 96.3 97.4 96.9  PLT 132* 139* 131*   Cardiac Enzymes: No results for input(s): CKTOTAL, CKMB, CKMBINDEX, TROPONINI in the last 168 hours. BNP (last 3 results) Recent Labs    03/02/20 0930  PROBNP 3,278*   CBG: No results for input(s): GLUCAP in the last 168 hours. D-Dimer: No results for input(s): DDIMER in the last 72  hours. Hgb A1c: No results for input(s): HGBA1C in the last 72 hours. Lipid Profile: No results for input(s): CHOL, HDL, LDLCALC, TRIG, CHOLHDL, LDLDIRECT in the last 72 hours. Thyroid function studies: No results for input(s): TSH, T4TOTAL, T3FREE, THYROIDAB in the last 72 hours.  Invalid input(s): FREET3 Anemia work up: No results for input(s): VITAMINB12, FOLATE, FERRITIN, TIBC, IRON, RETICCTPCT in the last 72 hours. Sepsis Labs: Recent Labs  Lab 11/14/20 0431 11/15/20 0912 11/16/20 0405  WBC 5.7 4.6 5.3    Microbiology No results found for this or any previous visit (from the past 240 hour(s)).  Procedures and diagnostic studies:  DG CHEST PORT 1 VIEW  Result Date: 11/18/2020 CLINICAL DATA:  Shortness of breath. EXAM: PORTABLE CHEST 1 VIEW COMPARISON:  Chest x-rays dated 11/16/2020 and 11/15/2020. FINDINGS: Stable cardiomegaly. LEFT chest wall pacemaker/ICD apparatus appears stable in position. Stable small LEFT pleural effusion. Lungs otherwise clear. No pneumothorax is seen. IMPRESSION: Stable small LEFT pleural effusion. No new lung findings. Stable cardiomegaly. Electronically Signed   By: Franki Cabot M.D.   On: 11/18/2020 09:03    Medications:   . ALPRAZolam  0.5 mg Oral QHS  . amiodarone  200 mg Oral BID  . bisacodyl  10 mg Rectal Once  . busPIRone  5 mg Oral BID  . furosemide  80 mg Intravenous Daily  . potassium chloride  20 mEq Oral Daily  . potassium chloride  40 mEq Oral Q4H  . Rivaroxaban  15 mg Oral Q supper  . sodium  chloride flush  3 mL Intravenous Q12H   Continuous Infusions: . sodium chloride       LOS: 10 days   Darliss Cheney , MD [facility]   How to contact the [facility] Attending or Consulting provider [place] or covering provider during after hours [place], for this patient?  1. Check the care team in [facility] and look for a) attending/consulting [facility] provider listed and b) the [facility] team listed 2. Log into [email or web address] and use [place] universal password to access. If you do not have the password, please contact the hospital operator. 3. Locate the [facility] provider you are looking for under [facility] and page to a number that you can be directly reached. 4. If you still have difficulty reaching the provider, please page the [facility] (Director on Call) for the Hospitalists listed on amion for assistance.  11/18/2020, 1:06 PM

## 2020-11-19 DIAGNOSIS — N1832 Chronic kidney disease, stage 3b: Secondary | ICD-10-CM | POA: Diagnosis not present

## 2020-11-19 DIAGNOSIS — I5043 Acute on chronic combined systolic (congestive) and diastolic (congestive) heart failure: Secondary | ICD-10-CM | POA: Diagnosis not present

## 2020-11-19 LAB — BASIC METABOLIC PANEL
Anion gap: 10 (ref 5–15)
BUN: 33 mg/dL — ABNORMAL HIGH (ref 8–23)
CO2: 34 mmol/L — ABNORMAL HIGH (ref 22–32)
Calcium: 9.3 mg/dL (ref 8.9–10.3)
Chloride: 96 mmol/L — ABNORMAL LOW (ref 98–111)
Creatinine, Ser: 1.8 mg/dL — ABNORMAL HIGH (ref 0.44–1.00)
GFR, Estimated: 27 mL/min — ABNORMAL LOW (ref >60)
Glucose, Bld: 91 mg/dL (ref 70–99)
Potassium: 3.8 mmol/L (ref 3.5–5.1)
Sodium: 140 mmol/L (ref 135–145)

## 2020-11-19 LAB — PH, BODY FLUID: pH, Body Fluid: 7.7

## 2020-11-19 LAB — MAGNESIUM: Magnesium: 1.7 mg/dL (ref 1.7–2.4)

## 2020-11-19 NOTE — Progress Notes (Signed)
Progress Note    LICIA HARL  LKT:[id number] DOB: 08-04-1930  DOA: 11/06/2020 PCP: Chesley Noon, MD    Brief Narrative:    Medical records reviewed and are as summarized below:  [name] is an 87 y.o. female with medical history significant of A.Fib on amiodarone and Xarelto, HTN, SSS s/p PPM.  CHF, CKD. Pt with h/o dCHF previously, echo in April 2021 showed newly reduced EF 25-30%, cards suspected this possibly due to her being paced 100% of the time.  Looks like adjustments were made, repeat echo in Sept 2021 showed normal EF. Last month her PCP noted that creat had increased to 1.68, was concerned that she was dehydrated and so stopped her lasix. Since that time she has had worsening edema now progressed to anasarca, SOB, DOE, orthopnea. Seen in cards office and had labs drawn last week on 12/23.  Cards put her on lasix 80mg  daily for 3 days then back to 60mg  daily.  Slow to improve with treatment of presumed UTI and adjustment of rate controlling meds and diuretics   Assessment/Plan:   Principal Problem:   CKD (chronic kidney disease) stage 3, GFR 30-59 ml/min (HCC) Active Problems:   Hypertension   Sick sinus syndrome (HCC)   Paroxysmal atrial fibrillation (HCC)   Biventricular cardiac pacemaker in situ   Acute on chronic combined systolic and diastolic CHF (congestive heart failure) (HCC)   AKI (acute kidney injury) ([place])   Dyspnea   Tricuspid valve regurgitation   Pressure injury of skin    Acute on chronic diastolic congestive heart failure/left pleural effusion: Cardiology on board. history of NICM with EF 25-30% 02/2020 in the setting of V-pacing - She underwent BiV upgrade in July.   IN early November 2021 she was doing good  NO edema  (seen in clinic by Olin Pia)   In Dec 2021 she was seen by Claudina Lick.. Patient felt much better after thoracentesis on 11/16/2020.  She is feeling better but is still requiring 2-3 L of oxygen and still has crackles at the  bases bilaterally with pitting edema in lower extremities.  Has positive JVD but per cardiology, this is due to her severe TR and would never go away.  Per cardiology note, she was walking in the hallway without requiring any oxygen.  Continues to be on IV Lasix.  Cardiology managing primarily.  Awaiting for cardiology to clear for discharge.  Hypokalemia: Resolved.  Left pleural effusion: Status post thoracentesis with 400 mL of hazy amber fluid on 11/16/2020.  AKI on CKD 3b -Baseline around 1.7 creatinine.  Came in with 1.9 which peaked to 2.4 and has started to improve.  SSS and biventricular PPM -  -follows with Dr. Caryl Comes  HTN -Controlled.  Continue low-dose metoprolol.  PAF -remains on amiodarone 200 mg p.o. twice daily as well as Xarelto.  Managed by cardiology.  ? UTI -present on admission U/A -dose of IV rocephin x3 days- completed 1/4   Family Communication/Anticipated D/C date and plan/Code Status   DVT prophylaxis: xarelto Code Status: Full Code.  Disposition Plan: Status is: inpt Family communication: None.  Discussed with patient herself.  Status is: Inpatient  Remains inpatient appropriate because:Inpatient level of care appropriate due to severity of illness   Dispo: The patient is from: Home              Anticipated d/c is to: Home  Anticipated d/c date is: 1 day              Patient currently is not medically stable to d/c.  Will be discharged when cleared by cardiology              Medical Consultants:    cards     Subjective:  Seen and examined.  Continues to feel well without having any symptoms but at the same time she continues to require oxygen and has crackles.  Edema has resolved today.  She tells me she feels like "a million dollar" and was asking when she can go home.  Objective:    Vitals:   11/18/20 2021 11/18/20 2153 11/19/20 0500 11/19/20 0548  BP: 110/69 (!) 105/58  114/67  Pulse: 73 70  70  Resp: 18   18   Temp: 98.2 F (36.8 C)   97.8 F (36.6 C)  TempSrc: Oral   Oral  SpO2: 98% 100%  97%  Weight:   62.7 kg   Height:        Intake/Output Summary (Last 24 hours) at 11/19/2020 1335 Last data filed at 11/19/2020 0653 Gross per 24 hour  Intake --  Output 120 ml  Net -120 ml   Filed Weights   11/15/20 0608 11/16/20 0440 11/19/20 0500  Weight: 67.3 kg 67.1 kg 62.7 kg    Exam:  General exam: Appears calm and comfortable  Respiratory system: Crackles bilaterally. Respiratory effort normal. Cardiovascular system: S1 & S2 heard, RRR. No JVD, murmurs, rubs, gallops or clicks. No pedal edema.  Bilateral positive JVD Gastrointestinal system: Abdomen is nondistended, soft and nontender. No organomegaly or masses felt. Normal bowel sounds heard. Central nervous system: Alert and oriented. No focal neurological deficits. Extremities: Symmetric 5 x 5 power. Skin: No rashes, lesions or ulcers.  Psychiatry: Judgement and insight appear normal. Mood & affect appropriate.    Data Reviewed:   I have personally reviewed following labs and imaging studies:  Labs: Labs show the following:   Basic Metabolic Panel: Recent Labs  Lab 11/15/20 0912 11/16/20 0405 11/17/20 0748 11/18/20 0805 11/19/20 0803  NA 139 140 141 139 140  K 4.0 4.3 3.5 2.8* 3.8  CL 101 100 95* 93* 96*  CO2 27 30 34* 33* 34*  GLUCOSE 141* 103* 86 94 91  BUN 40* 36* 37* 36* 33*  CREATININE 2.18* 1.99* 1.95* 1.78* 1.80*  CALCIUM 9.1 9.1 9.0 9.3 9.3  MG 1.8 1.8 1.8  --  1.7   GFR Estimated Creatinine Clearance: 18.8 mL/min (A) (by C-G formula based on SCr of 1.8 mg/dL (H)). Liver Function Tests: Recent Labs  Lab 11/17/20 0748  AST 53*  ALT 42  ALKPHOS 87  BILITOT 2.3*  PROT 6.0*  ALBUMIN 2.8*   No results for input(s): LIPASE, AMYLASE in the last 168 hours. No results for input(s): AMMONIA in the last 168 hours. Coagulation profile No results for input(s): INR, PROTIME in the last 168  hours.  CBC: Recent Labs  Lab 11/14/20 0431 11/15/20 0912 11/16/20 0405  WBC 5.7 4.6 5.3  NEUTROABS  --  3.4 3.7  HGB 11.1* 10.9* 11.2*  HCT 36.7 37.6 37.2  MCV 96.3 97.4 96.9  PLT 132* 139* 131*   Cardiac Enzymes: No results for input(s): CKTOTAL, CKMB, CKMBINDEX, TROPONINI in the last 168 hours. BNP (last 3 results) Recent Labs    03/02/20 0930  PROBNP 3,278*   CBG: No results for input(s): GLUCAP in the last 168 hours.  D-Dimer: No results for input(s): DDIMER in the last 72 hours. Hgb A1c: No results for input(s): HGBA1C in the last 72 hours. Lipid Profile: No results for input(s): CHOL, HDL, LDLCALC, TRIG, CHOLHDL, LDLDIRECT in the last 72 hours. Thyroid function studies: No results for input(s): TSH, T4TOTAL, T3FREE, THYROIDAB in the last 72 hours.  Invalid input(s): FREET3 Anemia work up: No results for input(s): VITAMINB12, FOLATE, FERRITIN, TIBC, IRON, RETICCTPCT in the last 72 hours. Sepsis Labs: Recent Labs  Lab 11/14/20 0431 11/15/20 0912 11/16/20 0405  WBC 5.7 4.6 5.3    Microbiology No results found for this or any previous visit (from the past 240 hour(s)).  Procedures and diagnostic studies:  DG CHEST PORT 1 VIEW  Result Date: 11/18/2020 CLINICAL DATA:  Shortness of breath. EXAM: PORTABLE CHEST 1 VIEW COMPARISON:  Chest x-rays dated 11/16/2020 and 11/15/2020. FINDINGS: Stable cardiomegaly. LEFT chest wall pacemaker/ICD apparatus appears stable in position. Stable small LEFT pleural effusion. Lungs otherwise clear. No pneumothorax is seen. IMPRESSION: Stable small LEFT pleural effusion. No new lung findings. Stable cardiomegaly. Electronically Signed   By: Franki Cabot M.D.   On: 11/18/2020 09:03    Medications:   . ALPRAZolam  0.5 mg Oral QHS  . amiodarone  200 mg Oral BID  . bisacodyl  10 mg Rectal Once  . busPIRone  5 mg Oral BID  . furosemide  80 mg Intravenous Daily  . potassium chloride  20 mEq Oral Daily  . Rivaroxaban  15 mg Oral Q  supper  . sodium chloride flush  3 mL Intravenous Q12H   Continuous Infusions: . sodium chloride       LOS: 11 days   Darliss Cheney , MD [facility]   How to contact the [facility] Attending or Consulting provider [place] or covering provider during after hours [place], for this patient?  1. Check the care team in [facility] and look for a) attending/consulting [facility] provider listed and b) the [facility] team listed 2. Log into [email or web address] and use [place]'s universal password to access. If you do not have the password, please contact the hospital operator. 3. Locate the [facility] provider you are looking for under [facility] and page to a number that you can be directly reached. 4. If you still have difficulty reaching the provider, please page the [facility] (Director on Call) for the Hospitalists listed on amion for assistance.  11/19/2020, 1:35 PM

## 2020-11-19 NOTE — Progress Notes (Addendum)
Progress Note  [name] Date of Encounter: 11/19/2020  Primary Cardiologist: Virl Axe, MD  Subjective   She reports she is feeling much better but still requiring O2 to maintain sats. Reports edema has resolved.  Inpatient Medications    Scheduled Meds: . ALPRAZolam  0.5 mg Oral QHS  . amiodarone  200 mg Oral BID  . bisacodyl  10 mg Rectal Once  . busPIRone  5 mg Oral BID  . furosemide  80 mg Intravenous Daily  . potassium chloride  20 mEq Oral Daily  . Rivaroxaban  15 mg Oral Q supper  . sodium chloride flush  3 mL Intravenous Q12H   Continuous Infusions: . sodium chloride     PRN Meds: sodium chloride, acetaminophen, lidocaine (PF), ondansetron (ZOFRAN) IV, sodium chloride flush   Vital Signs    Vitals:   11/18/20 2021 11/18/20 2153 11/19/20 0500 11/19/20 0548  BP: 110/69 (!) 105/58  114/67  Pulse: 73 70  70  Resp: 18   18  Temp: 98.2 F (36.8 C)   97.8 F (36.6 C)  TempSrc: Oral   Oral  SpO2: 98% 100%  97%  Weight:   62.7 kg   Height:        Intake/Output Summary (Last 24 hours) at 11/19/2020 0745 Last data filed at 11/19/2020 0653 Gross per 24 hour  Intake 3 ml  Output 120 ml  Net -117 ml   Last 3 Weights 11/19/2020 11/16/2020 11/15/2020  Weight (lbs) 138 lb 3.2 oz 147 lb 14.9 oz 148 lb 4.8 oz  Weight (kg) 62.687 kg 67.1 kg 67.268 kg     Telemetry    BiV paced, appears NSR - Personally Reviewed  Physical Exam   GEN: No acute distress, lying recumbent about 45 degrees without dyspnea.  HEENT: Normocephalic, atraumatic, sclera non-icteric. Neck: No JVD or bruits. Cardiac: RRR, 3/6 LSB to apex, no rubs or gallops.  Radials/DP/PT 1+ and equal bilaterally.  Respiratory: diffusely diminished to auscultation bilaterally. Breathing is unlabored. GI: Soft, nontender, non-distended, BS +x 4. MS: no deformity. Extremities: No clubbing or cyanosis. No edema. Distal pedal pulses are 2+ and equal bilaterally. Neuro:  AAOx3. Follows  commands. Psych:  Responds to questions appropriately with a normal affect.  Labs    High Sensitivity Troponin:  No results for input(s): TROPONINIHS in the last 720 hours.    Cardiac EnzymesNo results for input(s): TROPONINI in the last 168 hours. No results for input(s): TROPIPOC in the last 168 hours.   Chemistry Recent Labs  Lab 11/16/20 0405 11/17/20 0748 11/18/20 0805  NA 140 141 139  K 4.3 3.5 2.8*  CL 100 95* 93*  CO2 30 34* 33*  GLUCOSE 103* 86 94  BUN 36* 37* 36*  CREATININE 1.99* 1.95* 1.78*  CALCIUM 9.1 9.0 9.3  PROT  --  6.0*  --   ALBUMIN  --  2.8*  --   AST  --  53*  --   ALT  --  42  --   ALKPHOS  --  87  --   BILITOT  --  2.3*  --   GFRNONAA 24* 24* 27*  ANIONGAP 10 12 13      Hematology Recent Labs  Lab 11/14/20 0431 11/15/20 0912 11/16/20 0405  WBC 5.7 4.6 5.3  RBC 3.81* 3.86* 3.84*  HGB 11.1* 10.9* 11.2*  HCT 36.7 37.6 37.2  MCV 96.3 97.4 96.9  MCH 29.1 28.2 29.2  MCHC 30.2 29.0* 30.1  RDW  16.8* 16.7* 16.6*  PLT 132* 139* 131*    BNPNo results for input(s): BNP, PROBNP in the last 168 hours.   DDimer No results for input(s): DDIMER in the last 168 hours.   Radiology    DG CHEST PORT 1 VIEW  Result Date: 11/18/2020 CLINICAL DATA:  Shortness of breath. EXAM: PORTABLE CHEST 1 VIEW COMPARISON:  Chest x-rays dated 11/16/2020 and 11/15/2020. FINDINGS: Stable cardiomegaly. LEFT chest wall pacemaker/ICD apparatus appears stable in position. Stable small LEFT pleural effusion. Lungs otherwise clear. No pneumothorax is seen. IMPRESSION: Stable small LEFT pleural effusion. No new lung findings. Stable cardiomegaly. Electronically Signed   By: Franki Cabot M.D.   On: 11/18/2020 09:03    Cardiac Studies   2D Echo Limited 11/15/20  1. LVEF slightly decreased from the prior study, now 45-50% with diffuse  hypokinesis, otherwise no change from the prior study on 11/07/2020.  2. Left ventricular ejection fraction, by estimation, is 45 to 50%. The   left ventricle has mildly decreased function. The left ventricle  demonstrates global hypokinesis.  3. Right ventricular systolic function is moderately reduced. The right  ventricular size is moderately enlarged. There is mildly elevated  pulmonary artery systolic pressure. The estimated right ventricular  systolic pressure is 40.9 mmHg.  4. Left atrial size was severely dilated.  5. Right atrial size was moderately dilated.  6. A small pericardial effusion is present. The pericardial effusion is  posterior to the left ventricle. Moderate pleural effusion in the left  lateral region.  7. Moderate mitral regurgitation however can be underestimated due to  eccentric jet. The mitral valve is degenerative. Moderate mitral valve  regurgitation. No evidence of mitral stenosis. There is moderate prolapse  of multiple segments of the anterior  leaflet of the mitral valve. Moderate mitral annular calcification.  8. Tricuspid valve regurgitation is severe.  9. The aortic valve is normal in structure. There is moderate  calcification of the aortic valve. There is moderate thickening of the  aortic valve. Aortic valve regurgitation is trivial. Mild to moderate  aortic valve sclerosis/calcification is present,  without any evidence of aortic stenosis.  10. The inferior vena cava is dilated in size with <50% respiratory  variability, suggesting right atrial pressure of 15 mmHg.   2D echo 11/07/20  1. Left ventricular ejection fraction, by estimation, is 50 to 55%. The  left ventricle has low normal function. The left ventricle has no regional  wall motion abnormalities. There is mild left ventricular hypertrophy.  Left ventricular diastolic  parameters are indeterminate.  2. Right ventricular systolic function is moderately reduced. The right  ventricular size is moderately enlarged. There is mildly elevated  pulmonary artery systolic pressure. The estimated right ventricular   systolic pressure is 81.1 mmHg.  3. Right atrial size was severely dilated.  4. The mitral valve is degenerative. Moderate mitral annular  calcification. No evidence of mitral stenosis. Moderate mitral valve  regurgitation. There is significant prolapse of the anterior leaflet with  posterior directed MR jet that appears moderate  but may be underestimating as eccentric jet and not well visualized on  apical views. Could consider TEE or cardiac MRI for further evaluation  5. Tricuspid valve regurgitation is moderate.  6. The aortic valve is tricuspid. Aortic valve regurgitation is not  visualized. Mild to moderate aortic valve sclerosis/calcification is  present, without any evidence of aortic stenosis.  7. The inferior vena cava is dilated in size with <50% respiratory  variability, suggesting right atrial  pressure of 15 mmHg.   Patient Profile     87 y.o. female with history of NICM and chronic systolic CHF, persistent atrial fib on amiodarone therapy, high grade AV block with pacemaker, hyperthyroidism, mitral valve prolapse, anemia, LE edema, CKD stage III-IV. She had a drop in her EF in 02/2020 felt possibly pacemaker induced therefore PPM upgraded to CRT-P in 05/2020 with subsequent recovery in LV function 07/2020. She was recently seen as OP for worsening edema with OP diuretic adjustment She did not improve and presented 11/06/20 with worsening edema, SOB, DOE, orthopnea. 2D echo 11/07/20 showed EF 50-55%, moderate RV function, mildly elevated PASP, moderate MR with significant prolapse, moderate TR, dilated IVC. Limited echo 11/16/19 showed EF 45-50%, global HK, moderately reduced RV function with mildly elevated PASP, severe LAE, moderate RAE, small pericardial effusion, moderate MR with +MVP, dilated IVC.  Assessment & Plan    1. Acute on chronic systolic CHF, also with pleural effusion - EF 45-50% with MR/TR, moderate RV dysfunction as above - has diuresed 13lb thus far - I/O's  incomplete, but -7L with data we do have - request strict I/Os - underwent thoracentesis 11/17/19 with 400cc, do not see fluid was sent for cytology - OP weight when doing well was 132.7lb in 09/2020, currently at 138.2lb - continue diuresis for now but await labs given hypokalemia yesterday as well - will review with MD given first day rounding  2. Acute hypoxic respiratory failure - still requiring supplemental O2 via [place]/mask - O2 sats around 92% this AM, not on home O2 prior to admission - if this does not improve may need to consider pulm eval given amiodarone - CXR 11/19/19 with small left pleural effusion without new lung findings - continue diuresis as above  3. Mitral regurgitation with mitral valve prolapse, also severe tricuspid regurgitation - per previous notes, TR may be from pacer wire and Dr. Harrington Challenger planned to discuss with EP - Dr. Acie Fredrickson was on this weekend as well so will see if he was aware of those discussions - there is also question of whether mitral regurgitation should be further evaluated with TEE or MRI, will review with MD  4. Persistent atrial fib - per recent rounding note, no afib since 11/08/20 but 5.4% 12/23-12/30 - amiodarone recently increased to 200mg  BID, was on 100mg  daily at home - previous recs were to continue this higher dose for now and so will await rounding MD input on this - check thyroid in AM given chronic amiodarone use - home carvedilol appears on hold with borderline low-normal BP - on anticoag with Xarelto, dose adjusted for renal function  5. SSS s/p PPM with BiV upgrade in 05/2020 - device interrogated earlier this admission per report with 100% V pacing and optivol with fluid overload  6. CKD stage III-IV - recent AKI with Cr 2.41 prior to admission, remaining stable this admission  7. Hypokalemia - internal medicine has been managing - no labs yet this AM, have been ordered and are pending - recommend daily BMET  8.  Anemia/thrombocytopenia - Hgb similar to OP but decreased platelets is relatively new - continue to trend  For questions or updates, please contact [place] Please consult [email or web address] for contact info under Cardiology/STEMI.  Signed, Charlie Pitter, PA-C 11/19/2020, 7:45 AM    Attending Note:   The patient was seen and examined.  Agree with assessment and plan as noted above.  Changes made to the above note as needed.  Patient  seen and independently examined with  Dayna Dunn,PA .   We discussed all aspects of the encounter. I agree with the assessment and plan as stated above.  1.   Acute on chronic systolic CHF - associated with MR and TR Has diuresed 4.6 liters ( there are comments that her I/O are not correct)  Has also had a throacentesis  I was able to witness her ambulating in the halls - she is walking without any supplemntal O2 and kept her sats up  She is feeling much better  2.   MR, TR:   At this point , I would recommend medical therapy  I would hesitate putting her through a valve repair surgery  ? Candidate for mitraclip for her MV disease.       I have spent a total of 40 minutes with patient reviewing hospital  notes , telemetry, EKGs, labs and examining patient as well as establishing an assessment and plan that was discussed with the patient. > 50% of time was spent in direct patient care.    Thayer Headings, Brooke Bonito., MD, [facility] 11/19/2020, 11:27 AM [address]  [place] Pager [phone]

## 2020-11-19 NOTE — Plan of Care (Signed)
°  Problem: Fluid Volume: °Goal: Compliance with measures to maintain balanced fluid volume will improve °Outcome: Progressing °  °Problem: Nutritional: °Goal: Ability to make healthy dietary choices will improve °Outcome: Progressing °  °

## 2020-11-19 NOTE — Progress Notes (Addendum)
Physical Therapy Treatment Patient Details Name: [name] MRN: [id number] DOB: 29-Apr-1930 Today's Date: 11/19/2020    History of Present Illness 87 y.o. female with a hx of NICM, BiV ICD, atrial fib persistent, high grade AV block with PPM, on amiodarone, hyperthyroidism, anemia and lower ext edema, CKD 3-4  who is being seen for the evaluation of acute on chronic diastolic heart failure with right ventricular dysfunction and PAF    PT Comments    Pt continues to progress with mobility. Pt on 3L O2 via face mask when I entered with SpO2 probe on toe and reading 98%. Removed face mask. With any movement or activity SpO2 pleth erratic. Placed new probe on earlobe with good pleth. Able to amb on RA with SpO2 >94%. No dyspnea. Left O2 off of pt after session with resting SpO2 100%.     Follow Up Recommendations  Home health PT;Supervision - Intermittent     Equipment Recommendations  None recommended by PT    Recommendations for Other Services       Precautions / Restrictions Precautions Precautions: Fall    Mobility  Bed Mobility Overal bed mobility: Modified Independent             General bed mobility comments: HOB elevated and incr time  Transfers Overall transfer level: Needs assistance Equipment used: 4-wheeled walker Transfers: Sit to/from Stand Sit to Stand: Supervision         General transfer comment: Assist for lines and safety  Ambulation/Gait Ambulation/Gait assistance: Supervision Gait Distance (Feet): 200 Feet Assistive device: 4-wheeled walker Gait Pattern/deviations: Step-through pattern;Decreased stride length Gait velocity: decr Gait velocity interpretation: 1.31 - 2.62 ft/sec, indicative of limited community ambulator General Gait Details: Assist for safety and lines.   Stairs             Wheelchair Mobility    Modified Rankin (Stroke Patients Only)       Balance Overall balance assessment: Needs  assistance Sitting-balance support: Feet supported Sitting balance-Leahy Scale: Fair     Standing balance support: Single extremity supported Standing balance-Leahy Scale: Poor Standing balance comment: UE support and supervision                            Cognition Arousal/Alertness: Awake/alert Behavior During Therapy: WFL for tasks assessed/performed Overall Cognitive Status: Within Functional Limits for tasks assessed                                        Exercises      General Comments General comments (skin integrity, edema, etc.): Amb on RA with SpO2 >94% with good pleth and probe on earlobe.      Pertinent Vitals/Pain Pain Assessment: No/denies pain    Home Living                      Prior Function            PT Goals (current goals can now be found in the care plan section) Progress towards PT goals: Progressing toward goals    Frequency    Min 3X/week      PT Plan Current plan remains appropriate    Co-evaluation              AM-PAC PT "6 Clicks" Mobility   Outcome Measure  Help needed turning from your  back to your side while in a flat bed without using bedrails?: None Help needed moving from lying on your back to sitting on the side of a flat bed without using bedrails?: None Help needed moving to and from a bed to a chair (including a wheelchair)?: A Little Help needed standing up from a chair using your arms (e.g., wheelchair or bedside chair)?: A Little Help needed to walk in hospital room?: A Little Help needed climbing 3-5 steps with a railing? : A Lot 6 Click Score: 19    End of Session   Activity Tolerance: Patient tolerated treatment well Patient left: in chair;with call bell/phone within reach;with family/visitor present;with chair alarm set Nurse Communication: Mobility status;Other (comment) (Left O2 off) PT Visit Diagnosis: Unsteadiness on feet (R26.81);Muscle weakness (generalized)  (M62.81);Difficulty in walking, not elsewhere classified (R26.2)     Time: 1103-1130 PT Time Calculation (min) (ACUTE ONLY): 27 min  Charges:  $Gait Training: 23-37 mins                     [place] Pager [phone] Office [place] 11/19/2020, 12:33 PM

## 2020-11-19 NOTE — Progress Notes (Signed)
Occupational Therapy Treatment Patient Details Name: [name] MRN: [id number] DOB: 1930/06/14 Today's Date: 11/19/2020    History of present illness 87 y.o. female with a hx of NICM, BiV ICD, atrial fib persistent, high grade AV block with PPM, on amiodarone, hyperthyroidism, anemia and lower ext edema, CKD 3-4  who is being seen for the evaluation of acute on chronic diastolic heart failure with right ventricular dysfunction and PAF   OT comments  Pt received seated on toilet attempting to have BM. Pt reports pain from constipation, however pt agreeable to OT session. Pt continues to present with decreased activity tolerance and generalized weakness impacting pts ability to complete BADLs. Pt currently requires min guard for UB ADLs at sink, and min guard for LB ADLs from sitting. Pt complete household distance functional mobility with RW and min guard assist needing multimodal cues for RW mgmt as pt noted to leave RW behind during mobility tasks. Pt on RA during session, difficult to obtain O2 reading from ear probe however did get reading of 87% on OTAs pulse ox with sats increasing to 90% quickly. pt did not appear SOB and declines s/s of dyspnea. Pt would continue to benefit from skilled occupational therapy while admitted and after d/c to address the below listed limitations in order to improve overall functional mobility and facilitate independence with BADL participation. DC plan remains appropriate, will follow acutely per POC.    Follow Up Recommendations  Home health OT;Supervision - Intermittent    Equipment Recommendations  Other (comment) (may benefit from long handled tools for increased ease and safety with ADL's)    Recommendations for Other Services      Precautions / Restrictions Precautions Precautions: Fall Precaution Comments: HR Restrictions Weight Bearing Restrictions: No       Mobility Bed Mobility Overal bed mobility: Modified Independent              General bed mobility comments: elevated HOB with use of rail to return to supine  Transfers Overall transfer level: Needs assistance Equipment used: Rolling walker (2 wheeled) Transfers: Sit to/from Stand Sit to Stand: Min assist         General transfer comment: MIN A to power up from toilet needing cues for hand placement on grab bars    Balance Overall balance assessment: Needs assistance Sitting-balance support: Feet supported Sitting balance-Leahy Scale: Fair     Standing balance support: Single extremity supported;During functional activity Standing balance-Leahy Scale: Poor Standing balance comment: at least one UE supported during ADL tasks at sink                           ADL either performed or assessed with clinical judgement   ADL Overall ADL's : Needs assistance/impaired     Grooming: Wash/dry hands;Standing;Min guard       Lower Body Bathing: Min guard;Sitting/lateral leans Lower Body Bathing Details (indicate cue type and reason): simulated via posterior pericare from sitting on toilet         Toilet Transfer: Min guard;RW;Ambulation;Grab bars;Regular Toilet;Minimal assistance Toilet Transfer Details (indicate cue type and reason): cues for hand placement and assist to power into standing from low toilet seat Toileting- Clothing Manipulation and Hygiene: Min guard;Sitting/lateral lean;Supervision/safety;Set up [place] Manipulation Details (indicate cue type and reason): posterior pericare from toilet with set- up - supervision   Tub/Shower Transfer Details (indicate cue type and reason): pt reports walkin tub at home Functional mobility during ADLs:  Min guard;Rolling walker;Cueing for safety General ADL Comments: pt greeted on toilet reporting constipation attempting to void bowels with limited success. pt continues to present with increased pain ( from constipation), decreased activity tolerance and mild dyspnes with exertion on  RA     Vision       Perception     Praxis      Cognition Arousal/Alertness: Awake/alert Behavior During Therapy: WFL for tasks assessed/performed Overall Cognitive Status: Impaired/Different from baseline Area of Impairment: Attention;Safety/judgement                   Current Attention Level: Selective     Safety/Judgement: Decreased awareness of safety     General Comments: perseverating on constipation needing cues to reorient back to session, multimodal cues to manage RW as pt noted to attempt to walk off without AD        Exercises     Shoulder Instructions       General Comments pt on RA during session , difficult to obtain O2 reading from ear probe however did get reading of 87% on OTAs pulse ox with sats increasing to 90% quickly. pt did not appear SOB and declines s/s of dyspnea. pt reports pulse ox at home and able to state appropriate O2 reading    Pertinent Vitals/ Pain       Pain Assessment: Faces Faces Pain Scale: Hurts even more Pain Location: buttock Pain Descriptors / Indicators: Pressure;Other (Comment) (constipated) Pain Intervention(s): Monitored during session;Other (comment) (asked Rn to administer prune juice)  Home Living                                          Prior Functioning/Environment              Frequency  Min 2X/week        Progress Toward Goals  OT Goals(current goals can now be found in the care plan section)  Progress towards OT goals: Progressing toward goals  Acute Rehab OT Goals Patient Stated Goal: I want to be as indep as I can OT Goal Formulation: With patient Time For Goal Achievement: 11/27/20 Potential to Achieve Goals: Good ADL Goals Pt Will Perform Grooming: with modified independence;sitting Pt Will Perform Upper Body Dressing: with set-up;sitting Pt Will Perform Lower Body Dressing: with modified independence;sit to/from stand Pt Will Transfer to Toilet: with modified  independence;ambulating  Plan Discharge plan remains appropriate;Frequency remains appropriate    Co-evaluation                 AM-PAC OT "6 Clicks" Daily Activity     Outcome Measure   Help from another person eating meals?: None Help from another person taking care of personal grooming?: A Little Help from another person toileting, which includes using toliet, bedpan, or urinal?: A Little Help from another person bathing (including washing, rinsing, drying)?: A Little Help from another person to put on and taking off regular upper body clothing?: A Little Help from another person to put on and taking off regular lower body clothing?: A Little 6 Click Score: 19    End of Session Equipment Utilized During Treatment: Rolling walker  OT Visit Diagnosis: Muscle weakness (generalized) (M62.81)   Activity Tolerance Patient tolerated treatment well   Patient Left in bed;with call bell/phone within reach;with bed alarm set   Nurse Communication Mobility status;Other (comment) (requesting prune juice)  Time: 8182-9937 OT Time Calculation (min): 14 min  Charges: OT General Charges $OT Visit: 1 Visit OT Treatments $Self Care/Home Management : 8-22 mins  Harley Alto., COTA/L Acute Rehabilitation Services [phone]    Precious Haws 11/19/2020, 4:40 PM

## 2020-11-20 DIAGNOSIS — I5043 Acute on chronic combined systolic (congestive) and diastolic (congestive) heart failure: Secondary | ICD-10-CM | POA: Diagnosis not present

## 2020-11-20 DIAGNOSIS — Z950 Presence of cardiac pacemaker: Secondary | ICD-10-CM | POA: Diagnosis not present

## 2020-11-20 DIAGNOSIS — N1832 Chronic kidney disease, stage 3b: Secondary | ICD-10-CM | POA: Diagnosis not present

## 2020-11-20 LAB — CBC
HCT: 37.1 % (ref 36.0–46.0)
Hemoglobin: 11.1 g/dL — ABNORMAL LOW (ref 12.0–15.0)
MCH: 28.8 pg (ref 26.0–34.0)
MCHC: 29.9 g/dL — ABNORMAL LOW (ref 30.0–36.0)
MCV: 96.4 fL (ref 80.0–100.0)
Platelets: 138 10*3/uL — ABNORMAL LOW (ref 150–400)
RBC: 3.85 MIL/uL — ABNORMAL LOW (ref 3.87–5.11)
RDW: 17.1 % — ABNORMAL HIGH (ref 11.5–15.5)
WBC: 4.3 10*3/uL (ref 4.0–10.5)
nRBC: 0 % (ref 0.0–0.2)

## 2020-11-20 LAB — BASIC METABOLIC PANEL
Anion gap: 12 (ref 5–15)
BUN: 34 mg/dL — ABNORMAL HIGH (ref 8–23)
CO2: 34 mmol/L — ABNORMAL HIGH (ref 22–32)
Calcium: 9.4 mg/dL (ref 8.9–10.3)
Chloride: 96 mmol/L — ABNORMAL LOW (ref 98–111)
Creatinine, Ser: 1.85 mg/dL — ABNORMAL HIGH (ref 0.44–1.00)
GFR, Estimated: 26 mL/min — ABNORMAL LOW (ref >60)
Glucose, Bld: 103 mg/dL — ABNORMAL HIGH (ref 70–99)
Potassium: 3.7 mmol/L (ref 3.5–5.1)
Sodium: 142 mmol/L (ref 135–145)

## 2020-11-20 LAB — TSH: TSH: 2.834 u[IU]/mL (ref 0.350–4.500)

## 2020-11-20 LAB — T4, FREE: Free T4: 1.53 ng/dL — ABNORMAL HIGH (ref 0.61–1.12)

## 2020-11-20 MED ORDER — TORSEMIDE 20 MG PO TABS: 40.0000 mg | ORAL_TABLET | Freq: Every day | ORAL | Status: DC

## 2020-11-20 NOTE — Progress Notes (Signed)
Occupational Therapy Treatment Patient Details Name: [name] MRN: [id number] DOB: [birth year] Today's Date: 11/20/2020    History of present illness 87 y.o. female with a hx of NICM, BiV ICD, atrial fib persistent, high grade AV block with PPM, on amiodarone, hyperthyroidism, anemia and lower ext edema, CKD 3-4  who is being seen for the evaluation of acute on chronic diastolic heart failure with right ventricular dysfunction and PAF   OT comments  Pt standing >54mins with RW for stability. Pt's O2 >90% with exertion throughout tasks. Pt ambulating with RW and prefers rollator to RW for home. Pt limited by decreased strength and decreased activity tolerance. OTR education to pt and pt's daughter in room for energy conservation techniques.Pt supervisionA with RW for ADL tasks. Pt would benefit from continued OT skilled services. OT following acutely. VSS on RA >90% O2 with exertion.    Follow Up Recommendations  Home health OT;Supervision - Intermittent    Equipment Recommendations  3 in 1 bedside commode    Recommendations for Other Services      Precautions / Restrictions Precautions Precautions: Fall Restrictions Weight Bearing Restrictions: No       Mobility Bed Mobility Overal bed mobility: Modified Independent                Transfers Overall transfer level: Needs assistance Equipment used: Rolling walker (2 wheeled) Transfers: Sit to/from [employer] Transfers Sit to Stand: Supervision         General transfer comment: no physical assist    Balance Overall balance assessment: Needs assistance Sitting-balance support: Feet supported Sitting balance-Leahy Scale: Fair     Standing balance support: Bilateral upper extremity supported Standing balance-Leahy Scale: Poor Standing balance comment: BUE support on rollator                           ADL either performed or assessed with clinical judgement   ADL Overall ADL's : Needs  assistance/impaired Eating/Feeding: Set up;Sitting   Grooming: Supervision/safety;Standing Grooming Details (indicate cue type and reason): no physical assist. pt standing >5 mins with RW for stability                 Toilet Transfer: Supervision/safety;Ambulation;RW;Grab bars;Regular Museum/gallery exhibitions officer and Hygiene: Supervision/safety;Cueing for safety;Sitting/lateral lean;Sit to/from stand Toileting - Clothing Manipulation Details (indicate cue type and reason): standing for pericare     Functional mobility during ADLs: Supervision/safety;Rolling walker;Cueing for safety General ADL Comments: Pt's O2 >90% with exertion throughout tasks. Pt ambulating with RW and prefers rollator to RW for home. Pt limited by decreased strength and decreased activity tolerance. OTR education to pt and pt's daughter in room for energy conservation techniques.     Vision       Perception     Praxis      Cognition Arousal/Alertness: Awake/alert Behavior During Therapy: WFL for tasks assessed/performed Overall Cognitive Status: Within Functional Limits for tasks assessed                                 General Comments: Pt following all commands and reports that she is feeling better, but realizes that she is going to beed HH therapies to return to PLOF. Pt's daughter present in room        Exercises Exercises: General Lower Extremity General Exercises - Lower Extremity Long Arc Quad: AROM;Both;10 reps;Seated Hip ABduction/ADduction: AROM;Both;10 reps;Seated;Standing (  seated pillow squeezes X10; standing hip abduction X10) Hip Flexion/Marching: AROM;Both;10 reps;Standing (using walker)   Shoulder Instructions       General Comments Energy conservation technique worksheet    Pertinent Vitals/ Pain       Pain Assessment: No/denies pain  Home Living                                          Prior Functioning/Environment               Frequency  Min 2X/week        Progress Toward Goals  OT Goals(current goals can now be found in the care plan section)  Progress towards OT goals: Progressing toward goals  Acute Rehab OT Goals Patient Stated Goal: to be independent OT Goal Formulation: With patient Time For Goal Achievement: 11/27/20 Potential to Achieve Goals: Good ADL Goals Pt Will Perform Grooming: with modified independence;sitting Pt Will Perform Upper Body Dressing: with set-up;sitting Pt Will Perform Lower Body Dressing: with modified independence;sit to/from stand Pt Will Transfer to Toilet: with modified independence;ambulating  Plan Discharge plan needs to be updated    Co-evaluation                 AM-PAC OT "6 Clicks" Daily Activity     Outcome Measure   Help from another person eating meals?: None Help from another person taking care of personal grooming?: A Little Help from another person toileting, which includes using toliet, bedpan, or urinal?: A Little Help from another person bathing (including washing, rinsing, drying)?: A Little Help from another person to put on and taking off regular upper body clothing?: A Little Help from another person to put on and taking off regular lower body clothing?: A Little 6 Click Score: 19    End of Session Equipment Utilized During Treatment: Rolling walker  OT Visit Diagnosis: Muscle weakness (generalized) (M62.81)   Activity Tolerance Patient tolerated treatment well   Patient Left in chair;with call bell/phone within reach;with chair alarm set;with family/visitor present   Nurse Communication Mobility status        Time: 1212-1300 OT Time Calculation (min): 48 min  Charges: OT General Charges $OT Visit: 1 Visit OT Treatments $Self Care/Home Management : 23-37 mins $Therapeutic Activity: 8-22 mins  Jefferey Pica, OTR/L Acute Rehabilitation Services Pager: [phone] Office: [phone]    Nathalia Wismer C 11/20/2020,  2:43 PM

## 2020-11-20 NOTE — Progress Notes (Addendum)
Progress Note  [name] Date of Encounter: 11/20/2020  Primary Cardiologist: Virl Axe, MD  Subjective   She is feeling significantly better this morning. Was 98% RA per vitals this morning but still with mask on this AM and telemetry currently not hooked up. Nurse reports night shift had noted some desaturations overnight. Per PT notes was having issues with erratic reading which picked up better once placed on earlobe.  Inpatient Medications    Scheduled Meds: . ALPRAZolam  0.5 mg Oral QHS  . amiodarone  200 mg Oral BID  . bisacodyl  10 mg Rectal Once  . busPIRone  5 mg Oral BID  . furosemide  80 mg Intravenous Daily  . potassium chloride  20 mEq Oral Daily  . Rivaroxaban  15 mg Oral Q supper  . sodium chloride flush  3 mL Intravenous Q12H   Continuous Infusions: . sodium chloride     PRN Meds: sodium chloride, acetaminophen, lidocaine (PF), ondansetron (ZOFRAN) IV, sodium chloride flush   Vital Signs    Vitals:   11/19/20 0500 11/19/20 0548 11/19/20 2133 11/20/20 0649  BP:  114/67 113/63 120/74  Pulse:  70 70 69  Resp:  18 16 18   Temp:  97.8 F (36.6 C) 98.1 F (36.7 C) (!) 97.4 F (36.3 C)  TempSrc:  Oral Oral Oral  SpO2:  97% 98% 98%  Weight: 62.7 kg   62 kg  Height:       No intake or output data in the 24 hours ending 11/20/20 0914 Last 3 Weights 11/20/2020 11/19/2020 11/16/2020  Weight (lbs) 136 lb 11 oz 138 lb 3.2 oz 147 lb 14.9 oz  Weight (kg) 62 kg 62.687 kg 67.1 kg     Telemetry    BiV paced appears NSR - Personally Reviewed  Physical Exam   GEN: No acute distress.  HEENT: Normocephalic, atraumatic, sclera non-icteric. Neck: No JVD or bruits. Cardiac: RRR 3/6 SEM LSB to apex, no rubs or gallops.  Radials/DP/PT 1+ and equal bilaterally.  Respiratory: diminished BS at right base, otherwise clear to auscultation bilaterally. Breathing is unlabored. GI: Soft, nontender, non-distended, BS +x 4. MS: no  deformity. Extremities: No clubbing or cyanosis. No edema. Distal pedal pulses are 2+ and equal bilaterally. Neuro:  AAOx3. Follows commands. Psych:  Responds to questions appropriately with a normal affect.  Labs    High Sensitivity Troponin:  No results for input(s): TROPONINIHS in the last 720 hours.    Cardiac EnzymesNo results for input(s): TROPONINI in the last 168 hours. No results for input(s): TROPIPOC in the last 168 hours.   Chemistry Recent Labs  Lab 11/17/20 0748 11/18/20 0805 11/19/20 0803 11/20/20 0309  NA 141 139 140 142  K 3.5 2.8* 3.8 3.7  CL 95* 93* 96* 96*  CO2 34* 33* 34* 34*  GLUCOSE 86 94 91 103*  BUN 37* 36* 33* 34*  CREATININE 1.95* 1.78* 1.80* 1.85*  CALCIUM 9.0 9.3 9.3 9.4  PROT 6.0*  --   --   --   ALBUMIN 2.8*  --   --   --   AST 53*  --   --   --   ALT 42  --   --   --   ALKPHOS 87  --   --   --   BILITOT 2.3*  --   --   --   GFRNONAA 24* 27* 27* 26*  ANIONGAP 12 13 10 12      Hematology  Recent Labs  Lab 11/15/20 0912 11/16/20 0405 11/20/20 0309  WBC 4.6 5.3 4.3  RBC 3.86* 3.84* 3.85*  HGB 10.9* 11.2* 11.1*  HCT 37.6 37.2 37.1  MCV 97.4 96.9 96.4  MCH 28.2 29.2 28.8  MCHC 29.0* 30.1 29.9*  RDW 16.7* 16.6* 17.1*  PLT 139* 131* 138*    BNPNo results for input(s): BNP, PROBNP in the last 168 hours.   DDimer No results for input(s): DDIMER in the last 168 hours.   Radiology    No results found.  Cardiac Studies   2D Echo Limited 11/15/20  1. LVEF slightly decreased from the prior study, now 45-50% with diffuse  hypokinesis, otherwise no change from the prior study on 11/07/2020.  2. Left ventricular ejection fraction, by estimation, is 45 to 50%. The  left ventricle has mildly decreased function. The left ventricle  demonstrates global hypokinesis.  3. Right ventricular systolic function is moderately reduced. The right  ventricular size is moderately enlarged. There is mildly elevated  pulmonary artery systolic  pressure. The estimated right ventricular  systolic pressure is 17.5 mmHg.  4. Left atrial size was severely dilated.  5. Right atrial size was moderately dilated.  6. A small pericardial effusion is present. The pericardial effusion is  posterior to the left ventricle. Moderate pleural effusion in the left  lateral region.  7. Moderate mitral regurgitation however can be underestimated due to  eccentric jet. The mitral valve is degenerative. Moderate mitral valve  regurgitation. No evidence of mitral stenosis. There is moderate prolapse  of multiple segments of the anterior  leaflet of the mitral valve. Moderate mitral annular calcification.  8. Tricuspid valve regurgitation is severe.  9. The aortic valve is normal in structure. There is moderate  calcification of the aortic valve. There is moderate thickening of the  aortic valve. Aortic valve regurgitation is trivial. Mild to moderate  aortic valve sclerosis/calcification is present,  without any evidence of aortic stenosis.  10. The inferior vena cava is dilated in size with <50% respiratory  variability, suggesting right atrial pressure of 15 mmHg.   2D echo 11/07/20  1. Left ventricular ejection fraction, by estimation, is 50 to 55%. The  left ventricle has low normal function. The left ventricle has no regional  wall motion abnormalities. There is mild left ventricular hypertrophy.  Left ventricular diastolic  parameters are indeterminate.  2. Right ventricular systolic function is moderately reduced. The right  ventricular size is moderately enlarged. There is mildly elevated  pulmonary artery systolic pressure. The estimated right ventricular  systolic pressure is 10.2 mmHg.  3. Right atrial size was severely dilated.  4. The mitral valve is degenerative. Moderate mitral annular  calcification. No evidence of mitral stenosis. Moderate mitral valve  regurgitation. There is significant prolapse of the anterior  leaflet with  posterior directed MR jet that appears moderate  but may be underestimating as eccentric jet and not well visualized on  apical views. Could consider TEE or cardiac MRI for further evaluation  5. Tricuspid valve regurgitation is moderate.  6. The aortic valve is tricuspid. Aortic valve regurgitation is not  visualized. Mild to moderate aortic valve sclerosis/calcification is  present, without any evidence of aortic stenosis.  7. The inferior vena cava is dilated in size with <50% respiratory  variability, suggesting right atrial pressure of 15 mmHg.   Patient Profile     87 y.o. female with history of NICM and chronic systolic CHF, persistent atrial fib on amiodarone therapy, high  grade AV block with pacemaker, hyperthyroidism, mitral valve prolapse, anemia, LE edema, CKD stage III-IV. She had a drop in her EF in 02/2020 felt possibly pacemaker induced therefore PPM upgraded to CRT-P in 05/2020 with subsequent recovery in LV function 07/2020. She was recently seen as OP for worsening edema with OP diuretic adjustment She did not improve and presented 11/06/20 with worsening edema, SOB, DOE, orthopnea. 2D echo 11/07/20 showed EF 50-55%, moderate RV function, mildly elevated PASP, moderate MR with significant prolapse, moderate TR, dilated IVC. Limited echo 11/16/19 showed EF 45-50%, global HK, moderately reduced RV function with mildly elevated PASP, severe LAE, moderate RAE, small pericardial effusion, moderate MR with +MVP, dilated IVC.  Assessment & Plan    1. Acute on chronic systolic CHF, also with pleural effusion - EF 45-50% with MR/TR, moderate RV dysfunction as above - has diuresed 15lb thus far - I/O's incomplete - underwent thoracentesis 11/17/19 with 400cc, do not see fluid was sent for cytology - suspect nearing endpoint of diuresis, will review home recs with MD  2. Acute hypoxic respiratory failure - erratic readings noted as above but improved this AM - will need  final eval for home O2 prior to dc  3. Mitral regurgitation with mitral valve prolapse, also severe tricuspid regurgitation - per previous notes, TR may be from pacer wire, also was question of whether mitral regurgitation should be further evaluated with TEE or MRI - Dr. Acie Fredrickson recommends medical management for now without putting her through procedures  4. Persistent atrial fib - per recent rounding note, no afib since 11/08/20 but 5.4% 12/23-12/30 - amiodarone recently increased to 200mg  BID, was on 100mg  daily at home - previous recs were to continue this higher dose for now and so will await rounding MD input on this - needs f/u of thyroid function as OP given amiodarone use - home carvedilol appears on hold with borderline low-normal BP - can consider resumption today vs change to metoprolol - on anticoag with Xarelto, dose adjusted for renal function  5. SSS s/p PPM with BiV upgrade in 05/2020 - device interrogated earlier this admission per report with 100% V pacing and optivol with fluid overload  6. CKD stage III-IV - recent AKI with Cr 2.41 prior to admission, improved since then and remaining stable this admission  7. Hypokalemia - internal medicine has been managing  8. Anemia/thrombocytopenia - Hgb similar to OP but decreased platelets is relatively new - continue to trend  Tentatively arranged next available post hosp f/u 1/26.  For questions or updates, please contact [place] Please consult [email or web address] for contact info under Cardiology/STEMI.  Signed, Charlie Pitter, PA-C 11/20/2020, 9:14 AM     Attending Note:   The patient was seen and examined.  Agree with assessment and plan as noted above.  Changes made to the above note as needed.  Patient seen and independently examined with  Melina Copa, PA .   We discussed all aspects of the encounter. I agree with the assessment and plan as stated above.  1.   CHF :  Has diuresed nicely.  She also has had  a thoracentesis.  She is breathing much better. We were able to see her again in volume on her pacer device.  She will need to be monitored very closely watching for fluid accumulation.  I have changed the IV lasix to PO torsemide 40 mg a day  Lets plan on watching her for a day to make sure she is  stable  2.  Atrial fib:   Her amio has been increased to 200 mg bid.   I have discussed with EP . They will follow up with her soon  She has not had as much afib since increasing the amio.   Will anticipate reducing the amio to 200 mg a day when she is seen   3.  MR / TR :  I would recommend medical therapy for her MR and TR.   She could be evaluated by the mitraclip team but I suspect she may be too frail for this type of procedure.  4. sSS :  S/p pacer       I have spent a total of 40 minutes with patient reviewing hospital  notes , telemetry, EKGs, labs and examining patient as well as establishing an assessment and plan that was discussed with the patient. > 50% of time was spent in direct patient care.    Thayer Headings, Brooke Bonito., MD, [facility] 11/20/2020, 10:56 AM [address]  [place] Pager [phone]

## 2020-11-20 NOTE — Consult Note (Signed)
   [facility] CM Inpatient Consult   11/20/2020  [name] 07-29-1930 [id number]  [facility] [ACO] Patient: [facility]  PCP: Chesley Noon, MD, [facility]  We have reviewed your referral request and assigned this patient for outreach.   Natividad Brood, RN BSN [facility] Liaison  [phone] business mobile phone Toll free office [phone]  Fax number: [phone] [email or web address]

## 2020-11-20 NOTE — TOC Progression Note (Signed)
Transition of Care (TOC) - Progression Note  Marvetta Gibbons RN, BSN Transitions of Care Unit 4E- RN Case Manager See Treatment Team for direct phone # Cross Coverage for [place]   Patient Details  Name: [name] MRN: [id number] Date of Birth: 05/09/1930  Transition of Care [facility]) CM/SW Contact  Dahlia Client Romeo Rabon, RN Phone Number: 11/20/2020, 4:11 PM  Clinical Narrative:    Asked by MD to speak with pt's daughter at bedside regarding [place] and transition plan. Went to room and spoke with patient and daughter Butch Penny about [facility] arrangements and transition plan. Confirmed [place] arrangements with [facility] for HHPT- explained how [facility] will come out to home average 2x per week and contact them within 48 hr post discharge. Also discussed private duty assistance which they already have in place for pt's spouse- they plan to add assistance for patient (maybe just at night)- explained that this service not covered under Medicare and will be out of pocket expense for whatever they decide they need and set up.  Pt reports she uses DME that is her spouse's - per PT pt would benefit from having her own rollator- DME order requested from MD- daughter ok with using [employer] in house provider for delivery to the room- Call made to [employer] D[employer] line for request.   Also discussed [facility] and possible referral to them to see if pt eligible for additional support from [facility]- daughter and pt agreeable to [facility] referral- call made to [place] with [facility]- per [place] pt's PCP is not in network but will be able to use Cardiology to set up telephonic [facility] f/u- daughter updated that [facility] would be reaching out via phone call.    Expected Discharge Plan: [place] Barriers to Discharge: Continued Medical Work up  Expected Discharge Plan and Services Expected Discharge Plan: [place] In-house Referral: [facility] Discharge Planning Services: CM Consult Post Acute Care Choice: [place] arrangements for the past 2 months: Single Family Home                 DME Arranged: Walker rolling with seat DME Agency: [employer] Date DME Agency Contacted: 11/20/20 Time DME Agency Contacted: [id number] Representative spoke with at DME Agency: [place]: PT [place]: [place] Date [place]: 11/13/20 Time [place]: 1255 Representative spoke with at [place]: [place] ([place]) Interventions    Readmission Risk Interventions No flowsheet data found.

## 2020-11-20 NOTE — Progress Notes (Addendum)
Per epic chat discussion with Oda Kilts, Dr. Acie Fredrickson and Dr. Doristine Bosworth, will cancel 1/26 followup since Jonni Sanger will plan to see the patient in follow-up at a closer appt 1/21 instead. Appt is now listed on AVS.

## 2020-11-20 NOTE — Progress Notes (Signed)
Progress Note    [name]  SEG:[id number] DOB: [birth year]  DOA: 11/06/2020 PCP: Chesley Noon, MD    Brief Narrative:    Medical records reviewed and are as summarized below:  [name] is an 87 y.o. female with medical history significant of A.Fib on amiodarone and Xarelto, HTN, SSS s/p PPM.  CHF, CKD. Pt with h/o dCHF previously, echo in April 2021 showed newly reduced EF 25-30%, cards suspected this possibly due to her being paced 100% of the time.  Looks like adjustments were made, repeat echo in Sept 2021 showed normal EF. Last month her PCP noted that creat had increased to 1.68, was concerned that she was dehydrated and so stopped her lasix. Since that time she has had worsening edema now progressed to anasarca, SOB, DOE, orthopnea. Seen in cards office and had labs drawn last week on 12/23.  Cards put her on lasix 80mg  daily for 3 days then back to 60mg  daily.  Slow to improve with treatment of presumed UTI and adjustment of rate controlling meds and diuretics   Assessment/Plan:   Principal Problem:   CKD (chronic kidney disease) stage 3, GFR 30-59 ml/min (HCC) Active Problems:   Hypertension   Sick sinus syndrome (HCC)   Paroxysmal atrial fibrillation (HCC)   Biventricular cardiac pacemaker in situ   Acute on chronic combined systolic and diastolic CHF (congestive heart failure) (HCC)   AKI (acute kidney injury) ([place])   Dyspnea   Tricuspid valve regurgitation   Pressure injury of skin    Acute on chronic diastolic congestive heart failure/left pleural effusion: Cardiology on board. history of NICM with EF 25-30% 02/2020 in the setting of V-pacing - She underwent BiV upgrade in July.   IN early November 2021 she was doing good  NO edema  (seen in clinic by Olin Pia)   In Dec 2021 she was seen by Claudina Lick.. Patient felt much better after thoracentesis on 11/16/2020.  She is now much better and not hypoxic even off of oxygen.  Lungs clear too.  Has positive  JVD but per cardiology, this is due to her severe TR and would never go away.  Per cardiology note, she was walking in the hallway without requiring any oxygen.  Continues to be on IV Lasix.  Cardiology managing primarily.  Cardiology plans to switch her to oral torsemide today and watch overnight.  Hypokalemia: Resolved.  Left pleural effusion: Status post thoracentesis with 400 mL of hazy amber fluid on 11/16/2020.  AKI on CKD 3b -Baseline around 1.7 creatinine.  Came in with 1.9 which peaked to 2.4 which has improved and has stabilized around 1.8.  This is going to be her new baseline.  SSS and biventricular PPM -  -follows with Dr. Caryl Comes  HTN -Controlled.  Continue low-dose metoprolol.  PAF -remains on amiodarone 200 mg p.o. twice daily as well as Xarelto.  Managed by cardiology.  ? UTI -present on admission U/A -dose of IV rocephin x3 days- completed 1/4   Family Communication/Anticipated D/C date and plan/Code Status   DVT prophylaxis: xarelto Code Status: Full Code.  Disposition Plan: Status is: inpt Family communication: Daughter at the bedside.  Long discussion.  Status is: Inpatient  Remains inpatient appropriate because:Inpatient level of care appropriate due to severity of illness   Dispo: The patient is from: Home              Anticipated d/c is to: Home  Anticipated d/c date is: 1 day              Patient currently is not medically stable to d/c.                Medical Consultants:    cards     Subjective:  Patient seen and examined.  Her daughter was at the bedside.  Right of the back, daughter was upset as she had just finished talking with Dr. Acie Fredrickson of cardiology.  She was upset that she was told that her mother is ready for discharge today.  She was feeling overwhelmed because she already has to take care of her father and she was not sure if she is capable of providing the care that her mother needs at the same time.  Wants PT OT  to assess her.  I explained to her that PT OT has assessed her and they have recommended home health.  He still wants PT OT to reassess her and show her the needs that her mother is requiring at home and also wants full PT OT arranged at home and wants to talk to Education officer, museum.  I have sent a message to case manager to discuss this with patient's daughter and I also communicated with patient's nurse to reach out to PT OT to come assess patient today while the daughter is present so she can see herself.  Objective:    Vitals:   11/19/20 0500 11/19/20 0548 11/19/20 2133 11/20/20 0649  BP:  114/67 113/63 120/74  Pulse:  70 70 69  Resp:  18 16 18   Temp:  97.8 F (36.6 C) 98.1 F (36.7 C) (!) 97.4 F (36.3 C)  TempSrc:  Oral Oral Oral  SpO2:  97% 98% 98%  Weight: 62.7 kg   62 kg  Height:        Intake/Output Summary (Last 24 hours) at 11/20/2020 1109 Last data filed at 11/20/2020 1000 Gross per 24 hour  Intake 466 ml  Output --  Net 466 ml   Filed Weights   11/16/20 0440 11/19/20 0500 11/20/20 0649  Weight: 67.1 kg 62.7 kg 62 kg    Exam:  General exam: Appears calm and comfortable  Respiratory system: Clear to auscultation. Respiratory effort normal. Cardiovascular system: S1 & S2 heard, RRR. No JVD, murmurs, rubs, gallops or clicks. No pedal edema. Gastrointestinal system: Abdomen is nondistended, soft and nontender. No organomegaly or masses felt. Normal bowel sounds heard. Central nervous system: Alert and oriented. No focal neurological deficits. Extremities: Symmetric 5 x 5 power. Skin: No rashes, lesions or ulcers.  Psychiatry: Judgement and insight appear normal. Mood & affect appropriate.    Data Reviewed:   I have personally reviewed following labs and imaging studies:  Labs: Labs show the following:   Basic Metabolic Panel: Recent Labs  Lab 11/15/20 0912 11/16/20 0405 11/17/20 0748 11/18/20 0805 11/19/20 0803 11/20/20 0309  NA 139 140 141 139 140 142  K  4.0 4.3 3.5 2.8* 3.8 3.7  CL 101 100 95* 93* 96* 96*  CO2 27 30 34* 33* 34* 34*  GLUCOSE 141* 103* 86 94 91 103*  BUN 40* 36* 37* 36* 33* 34*  CREATININE 2.18* 1.99* 1.95* 1.78* 1.80* 1.85*  CALCIUM 9.1 9.1 9.0 9.3 9.3 9.4  MG 1.8 1.8 1.8  --  1.7  --    GFR Estimated Creatinine Clearance: 18.2 mL/min (A) (by C-G formula based on SCr of 1.85 mg/dL (H)). Liver Function Tests: Recent Labs  Lab 11/17/20 0748  AST 53*  ALT 42  ALKPHOS 87  BILITOT 2.3*  PROT 6.0*  ALBUMIN 2.8*   No results for input(s): LIPASE, AMYLASE in the last 168 hours. No results for input(s): AMMONIA in the last 168 hours. Coagulation profile No results for input(s): INR, PROTIME in the last 168 hours.  CBC: Recent Labs  Lab 11/14/20 0431 11/15/20 0912 11/16/20 0405 11/20/20 0309  WBC 5.7 4.6 5.3 4.3  NEUTROABS  --  3.4 3.7  --   HGB 11.1* 10.9* 11.2* 11.1*  HCT 36.7 37.6 37.2 37.1  MCV 96.3 97.4 96.9 96.4  PLT 132* 139* 131* 138*   Cardiac Enzymes: No results for input(s): CKTOTAL, CKMB, CKMBINDEX, TROPONINI in the last 168 hours. BNP (last 3 results) Recent Labs    03/02/20 0930  PROBNP 3,278*   CBG: No results for input(s): GLUCAP in the last 168 hours. D-Dimer: No results for input(s): DDIMER in the last 72 hours. Hgb A1c: No results for input(s): HGBA1C in the last 72 hours. Lipid Profile: No results for input(s): CHOL, HDL, LDLCALC, TRIG, CHOLHDL, LDLDIRECT in the last 72 hours. Thyroid function studies: Recent Labs    11/20/20 0309  TSH 2.834   Anemia work up: No results for input(s): VITAMINB12, FOLATE, FERRITIN, TIBC, IRON, RETICCTPCT in the last 72 hours. Sepsis Labs: Recent Labs  Lab 11/14/20 0431 11/15/20 0912 11/16/20 0405 11/20/20 0309  WBC 5.7 4.6 5.3 4.3    Microbiology No results found for this or any previous visit (from the past 240 hour(s)).  Procedures and diagnostic studies:  No results found.  Medications:   . ALPRAZolam  0.5 mg Oral QHS  .  amiodarone  200 mg Oral BID  . bisacodyl  10 mg Rectal Once  . busPIRone  5 mg Oral BID  . potassium chloride  20 mEq Oral Daily  . Rivaroxaban  15 mg Oral Q supper  . sodium chloride flush  3 mL Intravenous Q12H  . torsemide  40 mg Oral Daily   Continuous Infusions: . sodium chloride       LOS: 12 days   Darliss Cheney , MD [facility]   How to contact the [facility] Attending or Consulting provider Will or covering provider during after hours [place], for this patient?  1. Check the care team in [facility] and look for a) attending/consulting TRH provider listed and b) the [facility] team listed 2. Log into [email or web address] and use 's universal password to access. If you do not have the password, please contact the hospital operator. 3. Locate the [facility] provider you are looking for under [facility] and page to a number that you can be directly reached. 4. If you still have difficulty reaching the provider, please page the [facility] (Director on Call) for the Hospitalists listed on amion for assistance.  11/20/2020, 11:09 AM

## 2020-11-20 NOTE — Progress Notes (Addendum)
Physical Therapy Treatment Patient Details Name: [name] MRN: [id number] DOB: 09/02/1930 Today's Date: 11/20/2020    History of Present Illness 87 y.o. female with a hx of NICM, BiV ICD, atrial fib persistent, high grade AV block with PPM, on amiodarone, hyperthyroidism, anemia and lower ext edema, CKD 3-4  who is being seen for the evaluation of acute on chronic diastolic heart failure with right ventricular dysfunction and PAF    PT Comments    Pt progressing towards goals. Requiring supervision for safety for mobility using rollator. Gave HEP as well and reviewed with pt and pt's daughter. Daughter present and all questions answered. Updated DME recommendations, as pt reports rollator that she has been using is her husband's. Will continue to follow acutely.    Med[employer] Access Code: [email or web address]  Follow Up Recommendations  Home health PT;Supervision - Intermittent     Equipment Recommendations  Other (comment) (4 wheeled walker with seat (rollator))    Recommendations for Other Services       Precautions / Restrictions Precautions Precautions: Fall Restrictions Weight Bearing Restrictions: No    Mobility  Bed Mobility Overal bed mobility: Modified Independent                Transfers Overall transfer level: Needs assistance Equipment used: 4-wheeled walker Transfers: Sit to/from Stand Sit to Stand: Supervision         General transfer comment: Supervision for safety. Cues for hand placement.  Ambulation/Gait Ambulation/Gait assistance: Supervision Gait Distance (Feet): 100 Feet (X2) Assistive device: 4-wheeled walker Gait Pattern/deviations: Step-through pattern;Decreased stride length Gait velocity: Decreased   General Gait Details: Overall steady with use of rollator. Daughter requesting to see pt ambulate after pt back in room, so performed second ambulation trial. oxygen sats >90% on RA with probe on earlobe and good waveform. Educated about  walking program to perform at home.   Stairs             Wheelchair Mobility    Modified Rankin (Stroke Patients Only)       Balance Overall balance assessment: Needs assistance Sitting-balance support: Feet supported Sitting balance-Leahy Scale: Fair     Standing balance support: Bilateral upper extremity supported Standing balance-Leahy Scale: Poor Standing balance comment: BUE support on rollator                            Cognition Arousal/Alertness: Awake/alert Behavior During Therapy: WFL for tasks assessed/performed Overall Cognitive Status: Within Functional Limits for tasks assessed                                        Exercises General Exercises - Lower Extremity Long Arc Quad: AROM;Both;10 reps;Seated Hip ABduction/ADduction: AROM;Both;10 reps;Seated;Standing (seated pillow squeezes X10; standing hip abduction X10) Hip Flexion/Marching: AROM;Both;10 reps;Standing (using walker)    General Comments General comments (skin integrity, edema, etc.): Gave exercise program through medbridge. Access Code: [email or web address]      Pertinent Vitals/Pain Pain Assessment: No/denies pain    Home Living                      Prior Function            PT Goals (current goals can now be found in the care plan section) Acute Rehab PT Goals Patient Stated Goal: to be independent PT Goal Formulation: With patient Time  For Goal Achievement: 11/27/20 Potential to Achieve Goals: Good Progress towards PT goals: Progressing toward goals    Frequency    Min 3X/week      PT Plan Equipment recommendations need to be updated    Co-evaluation              AM-PAC PT "6 Clicks" Mobility   Outcome Measure  Help needed turning from your back to your side while in a flat bed without using bedrails?: None Help needed moving from lying on your back to sitting on the side of a flat bed without using bedrails?: None Help needed  moving to and from a bed to a chair (including a wheelchair)?: A Little Help needed standing up from a chair using your arms (e.g., wheelchair or bedside chair)?: A Little Help needed to walk in hospital room?: A Little Help needed climbing 3-5 steps with a railing? : A Little 6 Click Score: 20    End of Session Equipment Utilized During Treatment: Gait belt Activity Tolerance: Patient tolerated treatment well Patient left: in bed;with family/visitor present;with call bell/phone within reach (sitting EOB) Nurse Communication: Mobility status PT Visit Diagnosis: Unsteadiness on feet (R26.81);Muscle weakness (generalized) (M62.81);Difficulty in walking, not elsewhere classified (R26.2)     Time: 1132-1206 PT Time Calculation (min) (ACUTE ONLY): 34 min  Charges:  $Gait Training: 8-22 mins $Therapeutic Exercise: 8-22 mins                     [name], DPT  Acute Rehabilitation Services  Pager: [phone] Office: [phone]    [name] 11/20/2020, 1:34 PM

## 2020-11-21 ENCOUNTER — Telehealth: Payer: Self-pay | Admitting: Internal Medicine

## 2020-11-21 DIAGNOSIS — N1832 Chronic kidney disease, stage 3b: Secondary | ICD-10-CM | POA: Diagnosis not present

## 2020-11-21 DIAGNOSIS — I5043 Acute on chronic combined systolic (congestive) and diastolic (congestive) heart failure: Secondary | ICD-10-CM | POA: Diagnosis not present

## 2020-11-21 LAB — BASIC METABOLIC PANEL
Anion gap: 12 (ref 5–15)
BUN: 32 mg/dL — ABNORMAL HIGH (ref 8–23)
CO2: 31 mmol/L (ref 22–32)
Calcium: 8.9 mg/dL (ref 8.9–10.3)
Chloride: 95 mmol/L — ABNORMAL LOW (ref 98–111)
Creatinine, Ser: 1.68 mg/dL — ABNORMAL HIGH (ref 0.44–1.00)
GFR, Estimated: 29 mL/min — ABNORMAL LOW (ref >60)
Glucose, Bld: 106 mg/dL — ABNORMAL HIGH (ref 70–99)
Potassium: 3.9 mmol/L (ref 3.5–5.1)
Sodium: 138 mmol/L (ref 135–145)

## 2020-11-21 MED ORDER — TORSEMIDE 20 MG PO TABS: 40.0000 mg | ORAL_TABLET | Freq: Every day | ORAL | 0 refills | Status: DC

## 2020-11-21 MED ORDER — TORSEMIDE 20 MG PO TABS
40.0000 mg | ORAL_TABLET | Freq: Every day | ORAL | Status: DC
  Administered 2020-11-21: 40 mg via ORAL
  Filled 2020-11-21: qty 2

## 2020-11-21 MED ORDER — AMIODARONE HCL 200 MG PO TABS: 200.0000 mg | ORAL_TABLET | Freq: Two times a day (BID) | ORAL | 0 refills | Status: DC

## 2020-11-21 NOTE — Discharge Summary (Signed)
Physician Discharge Summary  [name] ATF:[id number] DOB: 11-Aug-1930 DOA: 11/06/2020  PCP: Chesley Noon, MD  Admit date: 11/06/2020 Discharge date: 11/21/2020 30 Day Unplanned Readmission Risk Score   Flowsheet Row ED to Hosp-Admission (Current) from 11/06/2020 in [facility]  30 Day Unplanned Readmission Risk Score (%) 14.94 Filed at 11/21/2020 0801     This score is the patient's risk of an unplanned readmission within 30 days of being discharged (0 -100%). The score is based on dignosis, age, lab data, medications, orders, and past utilization.   Low:  0-14.9   Medium: 15-21.9   High: 22-29.9   Extreme: 30 and above         Admitted From: Home Disposition: Home  Recommendations for Outpatient Follow-up:  1. Follow up with PCP in 1-2 weeks 2. Please repeat LFTs and TSH in 3 to 4 weeks now that her amiodarone dose has been doubled. 3. Follow-up with cardiology as scheduled 4. Please obtain BMP/CBC in one week 5. Please follow up with your PCP on the following pending results: Unresulted Labs (From admission, onward)          Start     Ordered   11/20/20 2706  Basic metabolic panel  Daily,   R     Question:  Specimen collection method  Answer:  Lab=Lab collect   11/19/20 0800            Home Health: Yes Equipment/Devices: None  Discharge Condition: Stable CODE STATUS: Full code Diet recommendation: Low-sodium  Subjective: Patient seen and examined.  She has no complaints.  She would like to go home.  Brief/Interim Summary: [name] is an 87 y.o. female with medical history significant ofA.Fib on amiodarone and Xarelto, HTN, SSS s/p PPM. CHF, CKD. Pt with h/o dCHF previously, echo in April 2021 showed newly reduced EF 25-30%, cards suspected this possibly due to her being paced 100% of the time. Looks like adjustments were made, repeat echo in Sept 2021 showed normal EF. Last month her PCP noted that creat had increased to 1.68,  was concerned that she was dehydrated and so stopped her lasix. Since that time she has had worsening edema progressed to anasarca, SOB, DOE, orthopnea. Seen in cards office and had labs drawn last week on 12/23. Cards put her on lasix 80mg  daily for 3 days then back to 60mg  daily.  Patient was admitted under hospitalist service with acute on chronic diastolic congestive heart failure/left pleural effusion.  Cardiology was consulted.  She was started on IV diuretics.  She also underwent thoracentesis on 11/16/2020 with retrieval of 400 cc hazy amber fluid.  Patient also came in with AKI on CKD stage IIIb.  Her baseline creatinine around 1.7, she presented with 1.8 which peaked to 2.4 and has improved since then and currently 1.6.  Her amiodarone dose was increased from 100mg  to 200 mg twice daily and Xarelto was continued.  She also was diagnosed with UTI for which she received 3 doses of IV Rocephin.  Patient remained very tenuous and diuretics were managed almost on daily basis due to her renal function.  Patient has elevated DVT but per cardiology, she will always have elevated JVD due to severe TR.  Her lower extremity edema has resolved.  Electrolytes were replaced.  Patient was seen by PT OT who recommended home health.  Patient has been requesting to discharge her home since last several days as she continues to feel well without having  any shortness of breath however she is requiring oxygen intermittently.  She has remained oxygen free for the last 2 days.  I and cardiologist Dr. Acie Fredrickson had discussion with the daughter in detail and we were going to discharge this patient yesterday however the daughter demanded that she wants physical therapy and Occupational Therapy to see patient 1 more time in front of her so that she can see how her mother is performing as she believes that that will help her make a decision.  I also offered her that if she does not feel capable of taking care of her mother, we can send  her to skilled nursing facility, she strongly declined that offer as well.  She wanted her mother to be on oral diuretics before she can take her home.  Per her request, patient was kept another night yesterday and she has been switched to oral torsemide today.  I had a long discussion with the daughter again today where I informed her about the plan of discharge today after she takes her torsemide and we will wait for few hours and that patient will be discharged if cardiology will clear her.  Daughter became upset 1 more time due to her mother being discharged today although this was very clearly discussed with the daughter yesterday.  She then threatened that she would appeal the discharge as she does not think that patient is medically ready.  She told me yesterday that she is overwhelmed by taking care of her father and now her mother needs help as well.  After patient received her torsemide, she had urinated twice, reasonable volume.  She was cleared by cardiology.  I was informed by cardiology and patient's primary RN that daughter is now happy and is willing to take patient home.  Patient is being discharged in stable condition.  Cardiology will follow up with her closely.  Discharge Diagnoses:  Principal Problem:   CKD (chronic kidney disease) stage 3, GFR 30-59 ml/min (HCC) Active Problems:   Hypertension   Sick sinus syndrome (HCC)   Paroxysmal atrial fibrillation (HCC)   Biventricular cardiac pacemaker in situ   Acute on chronic combined systolic and diastolic CHF (congestive heart failure) (HCC)   AKI (acute kidney injury) ([place])   Dyspnea   Tricuspid valve regurgitation   Pressure injury of skin    Discharge Instructions   Allergies as of 11/21/2020      Reactions   Ace Inhibitors Cough   Amlodipine Other (See Comments), Cough   Higher doses = VERY BAD COUGHING   Celebrex [celecoxib] Other (See Comments)   Reflux   Codeine Nausea Only   Rofecoxib Itching, Swelling, Other (See  Comments)   Vioxx- legs swelling, itching   Ether For Anesthesia [ether] Other (See Comments), Cough   Burning and dry cough   Norvasc [amlodipine Besylate] Other (See Comments), Cough   Higher doses = VERY BAD COUGHING      Medication List    STOP taking these medications   carvedilol 6.25 MG tablet Commonly known as: COREG   furosemide 40 MG tablet Commonly known as: LASIX     TAKE these medications   acetaminophen 500 MG tablet Commonly known as: TYLENOL Take 500-1,000 mg by mouth every 8 (eight) hours as needed for mild pain.   ALPRAZolam 0.5 MG tablet Commonly known as: XANAX Take 0.5 mg by mouth at bedtime.   amiodarone 200 MG tablet Commonly known as: PACERONE Take 1 tablet (200 mg total) by mouth 2 (  two) times daily. What changed:   how much to take  when to take this   B-12 2500 MCG Tabs Take 2,500 mcg by mouth every other day.   busPIRone 5 MG tablet Commonly known as: BUSPAR Take 5 mg by mouth 2 (two) times daily.   Magnesium 400 MG Caps Take 400 mg by mouth 3 (three) times a week.   multivitamin with minerals Tabs tablet Take 1 tablet by mouth daily.   Potassium 99 MG Tabs Take 198 mg by mouth daily.   Systane Complete 0.6 % Soln Generic drug: Propylene Glycol Place 1 drop into both eyes 2 (two) times daily as needed (for dryness).   torsemide 20 MG tablet Commonly known as: DEMADEX Take 2 tablets (40 mg total) by mouth daily. Start taking on: November 22, 2020   Xarelto 15 MG Tabs tablet Generic drug: Rivaroxaban TAKE 1 TABLET(15 MG) BY MOUTH DAILY WITH SUPPER What changed: See the new instructions.            Durable Medical Equipment  (From admission, onward)         Start     Ordered   11/20/20 1417  For home use only DME 4 wheeled rolling walker with seat  Once       Question:  Patient needs a walker to treat with the following condition  Answer:  Balance problem   11/20/20 1416          Follow-up Information     [facility] Follow up.   Specialty: Home Health Services Why: HHPT Contact information: [place] [address] [phone]        Shirley Friar, PA-C Follow up.   Specialty: Physician Assistant Why: [place] location - a follow-up has been arranged for you on Friday November 30, 2020 at 10:55 AM with Oda Kilts, PA. Please arrive 15 minutes early to check in. Contact information: [address] [place] [address] [phone]        [facility] Follow up.   Why: rollator (walker w/ seat) arranged- to be delivered to room prior to discharge Contact information: [address] [place] [address] [phone]        Chesley Noon, MD Follow up in 1 week(s).   Specialty: Family Medicine Contact information: [place] [address] [phone]        Deboraha Sprang, MD .   Specialty: Cardiology Contact information: [phone] [place] [address] [phone]              Allergies  Allergen Reactions  . Ace Inhibitors Cough  . Amlodipine Other (See Comments) and Cough    Higher doses = VERY BAD COUGHING  . Celebrex [Celecoxib] Other (See Comments)    Reflux  . Codeine Nausea Only  . Rofecoxib Itching, Swelling and Other (See Comments)    Vioxx- legs swelling, itching  . Ether For Anesthesia [Ether] Other (See Comments) and Cough    Burning and dry cough  . Norvasc [Amlodipine Besylate] Other (See Comments) and Cough    Higher doses = VERY BAD COUGHING     Consultations: Cardiology   Procedures/Studies: DG Chest 1 View  Result Date: 11/16/2020 CLINICAL DATA:  Status post left thoracentesis EXAM: CHEST  1 VIEW COMPARISON:  11/15/2020 FINDINGS: Interval decrease of left pleural effusion.  No pneumothorax. Unchanged cardiomegaly. No significant abnormality of  the right lung. Linear edge along the inferior  aspect of the right lateral lung base is likely a skin fold. Unchanged left chest wall pacemaker. IMPRESSION: No pneumothorax status post left thoracentesis. Electronically Signed   By: Miachel Roux M.D.   On: 11/16/2020 09:30   DG Chest 2 View  Result Date: 11/15/2020 CLINICAL DATA:  Shortness of breath EXAM: CHEST - 2 VIEW COMPARISON:  11/08/2020 FINDINGS: Stable positioning of a left-sided implanted cardiac device. Stable cardiomegaly. Persistent layering bilateral pleural effusions, left slightly greater than right. Mild hazy bibasilar opacities, similar to prior. No pneumothorax. Degenerative changes of the bilateral glenohumeral joints. IMPRESSION: Persistent layering bilateral pleural effusions, left slightly greater than right. Hazy bibasilar opacities, favor atelectasis. Electronically Signed   By: Davina Poke D.O.   On: 11/15/2020 11:33   DG Chest 2 View  Result Date: 11/08/2020 CLINICAL DATA:  Dyspnea this morning EXAM: CHEST - 2 VIEW COMPARISON:  11/06/2020 FINDINGS: Frontal and lateral views of the chest demonstrates stable multi lead pacer. Cardiac silhouette remains enlarged. Continued bilateral pleural effusions, left greater than right, with likely left basilar atelectasis. No pneumothorax. IMPRESSION: 1. Stable bilateral pleural effusions and left basilar atelectasis. Electronically Signed   By: Randa Ngo M.D.   On: 11/08/2020 18:48   DG Chest 2 View  Result Date: 11/06/2020 CLINICAL DATA:  Increasing shortness of breath over the past few weeks. EXAM: CHEST - 2 VIEW COMPARISON:  PA and lateral chest 05/30/2020. FINDINGS: There is cardiomegaly without edema. Small bilateral pleural effusions basilar atelectasis are seen. Pacing device in place. No pneumothorax. IMPRESSION: Small bilateral pleural effusions and basilar atelectasis. Cardiomegaly without edema. Electronically Signed   By: Inge Rise M.D.   On: 11/06/2020 13:52   DG CHEST PORT 1 VIEW  Result Date:  11/18/2020 CLINICAL DATA:  Shortness of breath. EXAM: PORTABLE CHEST 1 VIEW COMPARISON:  Chest x-rays dated 11/16/2020 and 11/15/2020. FINDINGS: Stable cardiomegaly. LEFT chest wall pacemaker/ICD apparatus appears stable in position. Stable small LEFT pleural effusion. Lungs otherwise clear. No pneumothorax is seen. IMPRESSION: Stable small LEFT pleural effusion. No new lung findings. Stable cardiomegaly. Electronically Signed   By: Franki Cabot M.D.   On: 11/18/2020 09:03   ECHOCARDIOGRAM COMPLETE  Result Date: 11/07/2020    ECHOCARDIOGRAM REPORT   Patient Name:   [name] Date of Exam: 11/07/2020 Medical Rec #:  [id number]      Height:       64.0 in Accession #:    [id number]     Weight:       150.0 lb Date of Birth:  Jun 29, 1930      BSA:          1.731 m Patient Age:    64 years       BP:           107/62 mmHg Patient Gender: F              HR:           60 bpm. Exam Location:  Inpatient Procedure: 2D Echo, Cardiac Doppler and Color Doppler Indications:    I50.40* Unspecified combined systolic (congestive) and diastolic                 (congestive) heart failure  History:        Patient has prior history of Echocardiogram examinations, most                 recent 07/31/2020. Abnormal ECG and Pacemaker,  Mitral Valve                 Disease, Arrythmias:Atrial Fibrillation, Signs/Symptoms:Dyspnea;                 Risk Factors:Hypertension. Edema. Mitral valve prolapse.                 Hypoxia.  Sonographer:    Roseanna Rainbow RDCS Referring Phys: [phone] JARED M GARDNER  Sonographer Comments: Technically difficult study due to poor echo windows. IMPRESSIONS  1. Left ventricular ejection fraction, by estimation, is 50 to 55%. The left ventricle has low normal function. The left ventricle has no regional wall motion abnormalities. There is mild left ventricular hypertrophy. Left ventricular diastolic parameters are indeterminate.  2. Right ventricular systolic function is moderately reduced. The right ventricular size  is moderately enlarged. There is mildly elevated pulmonary artery systolic pressure. The estimated right ventricular systolic pressure is 34.7 mmHg.  3. Right atrial size was severely dilated.  4. The mitral valve is degenerative. Moderate mitral annular calcification. No evidence of mitral stenosis. Moderate mitral valve regurgitation. There is significant prolapse of the anterior leaflet with posterior directed MR jet that appears moderate but may be underestimating as eccentric jet and not well visualized on apical views. Could consider TEE or cardiac MRI for further evaluation  5. Tricuspid valve regurgitation is moderate.  6. The aortic valve is tricuspid. Aortic valve regurgitation is not visualized. Mild to moderate aortic valve sclerosis/calcification is present, without any evidence of aortic stenosis.  7. The inferior vena cava is dilated in size with <50% respiratory variability, suggesting right atrial pressure of 15 mmHg. FINDINGS  Left Ventricle: Left ventricular ejection fraction, by estimation, is 50 to 55%. The left ventricle has low normal function. The left ventricle has no regional wall motion abnormalities. The left ventricular internal cavity size was normal in size. There is mild left ventricular hypertrophy. Left ventricular diastolic parameters are indeterminate. Right Ventricle: The right ventricular size is moderately enlarged. Right vetricular wall thickness was not well visualized. Right ventricular systolic function is moderately reduced. There is mildly elevated pulmonary artery systolic pressure. The tricuspid regurgitant velocity is 2.66 m/s, and with an assumed right atrial pressure of 15 mmHg, the estimated right ventricular systolic pressure is 42.5 mmHg. Left Atrium: Left atrial size was normal in size. Right Atrium: Right atrial size was severely dilated. Pericardium: Trivial pericardial effusion is present. Mitral Valve: The mitral valve is degenerative in appearance. Moderate  mitral annular calcification. Moderate mitral valve regurgitation. No evidence of mitral valve stenosis. MV peak gradient, 6.2 mmHg. The mean mitral valve gradient is 1.0 mmHg. Tricuspid Valve: The tricuspid valve is normal in structure. Tricuspid valve regurgitation is moderate. Aortic Valve: The aortic valve is tricuspid. Aortic valve regurgitation is not visualized. Mild to moderate aortic valve sclerosis/calcification is present, without any evidence of aortic stenosis. Aortic valve mean gradient measures 3.0 mmHg. Aortic valve peak gradient measures 5.6 mmHg. Aortic valve area, by VTI measures 2.84 cm. Pulmonic Valve: The pulmonic valve was normal in structure. Pulmonic valve regurgitation is mild to moderate. Aorta: The aortic root and ascending aorta are structurally normal, with no evidence of dilitation. Venous: The inferior vena cava is dilated in size with less than 50% respiratory variability, suggesting right atrial pressure of 15 mmHg. IAS/Shunts: The interatrial septum was not well visualized. Additional Comments: There is a small pleural effusion in the left lateral region.  LEFT VENTRICLE PLAX 2D LVIDd:  4.30 cm     Diastology LVIDs:         3.05 cm     LV e' medial:    3.55 cm/s LV PW:         1.00 cm     LV E/e' medial:  32.1 LV IVS:        1.10 cm     LV e' lateral:   6.19 cm/s LVOT diam:     2.00 cm     LV E/e' lateral: 18.4 LV SV:         71 LV SV Index:   41 LVOT Area:     3.14 cm  LV Volumes (MOD) LV vol d, MOD A2C: 79.8 ml LV vol d, MOD A4C: 57.8 ml LV vol s, MOD A2C: 34.6 ml LV vol s, MOD A4C: 26.5 ml LV SV MOD A2C:     45.2 ml LV SV MOD A4C:     57.8 ml LV SV MOD BP:      39.8 ml RIGHT VENTRICLE            IVC RV S prime:     6.58 cm/s  IVC diam: 2.50 cm TAPSE (M-mode): 0.7 cm LEFT ATRIUM             Index       RIGHT ATRIUM           Index LA diam:        3.75 cm 2.17 cm/m  RA Area:     28.90 cm LA Vol (A2C):   40.5 ml 23.39 ml/m RA Volume:   98.10 ml  56.67 ml/m LA Vol  (A4C):   56.0 ml 32.35 ml/m LA Biplane Vol: 49.4 ml 28.53 ml/m  AORTIC VALVE                   PULMONIC VALVE AV Area (Vmax):    3.05 cm    PR End Diast Vel: 2.32 msec AV Area (Vmean):   2.88 cm AV Area (VTI):     2.84 cm AV Vmax:           118.50 cm/s AV Vmean:          80.300 cm/s AV VTI:            0.248 m AV Peak Grad:      5.6 mmHg AV Mean Grad:      3.0 mmHg LVOT Vmax:         115.00 cm/s LVOT Vmean:        73.700 cm/s LVOT VTI:          0.225 m LVOT/AV VTI ratio: 0.91  AORTA Ao Root diam: 3.10 cm MITRAL VALVE                TRICUSPID VALVE MV Area (PHT): 3.77 cm     TR Peak grad:   28.3 mmHg MV Peak grad:  6.2 mmHg     TR Vmax:        266.00 cm/s MV Mean grad:  1.0 mmHg MV Vmax:       1.24 m/s     SHUNTS MV Vmean:      47.5 cm/s    Systemic VTI:  0.22 m MV Decel Time: 201 msec     Systemic Diam: 2.00 cm MR Peak grad: 66.3 mmHg MR Mean grad: 37.0 mmHg MR Vmax:      407.00 cm/s MR Vmean:     282.0 cm/s MV  E velocity: 114.00 cm/s Oswaldo Milian MD Electronically signed by Oswaldo Milian MD Signature Date/Time: 11/07/2020/8:59:56 PM    Final    CUP PACEART INCLINIC DEVICE CHECK  Result Date: 11/01/2020 CRT-P device check in clinic. Normal device function. Thresholds, sensing, impedance consistent with previous measurements. Histograms appropriate for patient and level of activity. LV threshold has been chronically elevated. No mode switches or ventricular high rate episodes. Patient bi-ventricularly pacing 99.55% of the time. Device programmed with appropriate safety margins. Device heart failure diagnostics are within normal limits and stable over time. Estimated longevity 4 yr, 10 mo. Patient enrolled in remote follow-up. RTC 6 months  ECHOCARDIOGRAM LIMITED  Result Date: 11/15/2020    ECHOCARDIOGRAM LIMITED REPORT   Patient Name:   [name] Date of Exam: 11/15/2020 Medical Rec #:  [id number]      Height:       62.0 in Accession #:    [id number]     Weight:       148.3 lb Date of  Birth:  Jan 22, 1930      BSA:          1.684 m Patient Age:    55 years       BP:           99/70 mmHg Patient Gender: F              HR:           98 bpm. Exam Location:  Inpatient Procedure: Limited Echo, Limited Color Doppler and Cardiac Doppler Indications:    acute systolic chf  History:        Patient has prior history of Echocardiogram examinations, most                 recent 12/09/2019. Pacemaker, chronic kidney disease,                 Arrythmias:Atrial Fibrillation; Risk Factors:Hypertension.  Sonographer:    Johny Chess Referring Phys: [id number] PAULA V ROSS IMPRESSIONS  1. LVEF slightly decreased from the prior study, now 45-50% with diffuse hypokinesis, otherwise no change from the prior study on 11/07/2020.  2. Left ventricular ejection fraction, by estimation, is 45 to 50%. The left ventricle has mildly decreased function. The left ventricle demonstrates global hypokinesis.  3. Right ventricular systolic function is moderately reduced. The right ventricular size is moderately enlarged. There is mildly elevated pulmonary artery systolic pressure. The estimated right ventricular systolic pressure is 37.3 mmHg.  4. Left atrial size was severely dilated.  5. Right atrial size was moderately dilated.  6. A small pericardial effusion is present. The pericardial effusion is posterior to the left ventricle. Moderate pleural effusion in the left lateral region.  7. Moderate mitral regurgitation however can be underestimated due to eccentric jet. The mitral valve is degenerative. Moderate mitral valve regurgitation. No evidence of mitral stenosis. There is moderate prolapse of multiple segments of the anterior leaflet of the mitral valve. Moderate mitral annular calcification.  8. Tricuspid valve regurgitation is severe.  9. The aortic valve is normal in structure. There is moderate calcification of the aortic valve. There is moderate thickening of the aortic valve. Aortic valve regurgitation is trivial. Mild  to moderate aortic valve sclerosis/calcification is present, without any evidence of aortic stenosis. 10. The inferior vena cava is dilated in size with <50% respiratory variability, suggesting right atrial pressure of 15 mmHg. FINDINGS  Left Ventricle: Left ventricular ejection fraction, by estimation, is 45 to 50%. The left  ventricle has mildly decreased function. The left ventricle demonstrates global hypokinesis. The left ventricular internal cavity size was normal in size. There is  no left ventricular hypertrophy. Right Ventricle: The right ventricular size is moderately enlarged. No increase in right ventricular wall thickness. Right ventricular systolic function is moderately reduced. There is mildly elevated pulmonary artery systolic pressure. The tricuspid regurgitant velocity is 2.52 m/s, and with an assumed right atrial pressure of 15 mmHg, the estimated right ventricular systolic pressure is 35.7 mmHg. Left Atrium: Left atrial size was severely dilated. Right Atrium: Right atrial size was moderately dilated. Pericardium: A small pericardial effusion is present. The pericardial effusion is posterior to the left ventricle. Mitral Valve: Moderate mitral regurgitation however can be underestimated due to eccentric jet. The mitral valve is degenerative in appearance. There is moderate prolapse of multiple segments of the anterior leaflet of the mitral valve. There is moderate  thickening of the mitral valve leaflet(s). Moderate mitral annular calcification. Moderate mitral valve regurgitation. No evidence of mitral valve stenosis. Tricuspid Valve: The tricuspid valve is normal in structure. Tricuspid valve regurgitation is severe. No evidence of tricuspid stenosis. Aortic Valve: The aortic valve is normal in structure. There is moderate calcification of the aortic valve. There is moderate thickening of the aortic valve. Aortic valve regurgitation is trivial. Mild to moderate aortic valve  sclerosis/calcification is present, without any evidence of aortic stenosis. Pulmonic Valve: The pulmonic valve was normal in structure. Pulmonic valve regurgitation is mild. No evidence of pulmonic stenosis. Aorta: The aortic root is normal in size and structure. Venous: The inferior vena cava is dilated in size with less than 50% respiratory variability, suggesting right atrial pressure of 15 mmHg. IAS/Shunts: No atrial level shunt detected by color flow Doppler. Additional Comments: A pacer wire is visualized in the right atrium and right ventricle. There is a moderate pleural effusion in the left lateral region. RIGHT VENTRICLE            IVC RV S prime:     7.94 cm/s  IVC diam: 2.30 cm TAPSE (M-mode): 1.3 cm AORTIC VALVE LVOT Vmax:   47.60 cm/s LVOT Vmean:  31.200 cm/s LVOT VTI:    0.071 m MR Peak grad:    81.0 mmHg   TRICUSPID VALVE MR Mean grad:    46.0 mmHg   TR Peak grad:   25.4 mmHg MR Vmax:         450.00 cm/s TR Vmax:        252.00 cm/s MR Vmean:        319.0 cm/s MR PISA:         2.26 cm    SHUNTS MR PISA Eff ROA: 20 mm      Systemic VTI: 0.07 m MR PISA Radius:  0.60 cm Ena Dawley MD Electronically signed by Ena Dawley MD Signature Date/Time: 11/15/2020/8:51:23 PM    Final    IR THORACENTESIS ASP PLEURAL SPACE W/IMG GUIDE  Result Date: 11/16/2020 INDICATION: Patient with history of acute on chronic HF, CKD stage III-IV, dyspnea, and bilateral pleural effusions, L>R. Request is made for diagnostic therapeutic left thoracentesis. EXAM: ULTRASOUND GUIDED DIAGNOSTIC AND THERAPEUTIC LEFT THORACENTESIS MEDICATIONS: 8 mL 1% Lidocaine COMPLICATIONS: None immediate. PROCEDURE: An ultrasound guided thoracentesis was thoroughly discussed with the patient and questions answered. The benefits, risks, alternatives and complications were also discussed. The patient understands and wishes to proceed with the procedure. Written consent was obtained. Ultrasound was performed to localize and mark an adequate  pocket of  fluid in the left chest. The area was then prepped and draped in the normal sterile fashion. 1% Lidocaine was used for local anesthesia. Under ultrasound guidance a 6 Fr Safe-T-Centesis catheter was introduced. Thoracentesis was performed. The catheter was removed and a dressing applied. FINDINGS: A total of approximately 400 mL of hazy amber fluid was removed. Samples were sent to the laboratory as requested by the clinical team. IMPRESSION: Successful ultrasound guided left thoracentesis yielding 400 mL of pleural fluid. Read by: Earley Abide, PA-C Electronically Signed   By: Jerilynn Mages.  Shick M.D.   On: 11/16/2020 09:44     Discharge Exam: Vitals:   11/21/20 0529 11/21/20 1101  BP: 117/71 121/85  Pulse:    Resp:    Temp: 98.1 F (36.7 C)   SpO2: 93%    Vitals:   11/20/20 2125 11/20/20 2153 11/21/20 0529 11/21/20 1101  BP: 121/61  117/71 121/85  Pulse: 69 72    Resp:      Temp: 98.2 F (36.8 C)  98.1 F (36.7 C)   TempSrc: Oral  Oral   SpO2: 90% 94% 93%   Weight:   62.2 kg   Height:        General: Pt is alert, awake, not in acute distress Cardiovascular: RRR, S1/S2 +, no rubs, no gallops Respiratory: Very faint crackles at the bases bilaterally, no wheezing, no rhonchi Abdominal: Soft, NT, ND, bowel sounds + Extremities: no edema, no cyanosis    The results of significant diagnostics from this hospitalization (including imaging, microbiology, ancillary and laboratory) are listed below for reference.     Microbiology: No results found for this or any previous visit (from the past 240 hour(s)).   Labs: BNP (last 3 results) Recent Labs    11/06/20 1745  BNP 3,267.1*   Basic Metabolic Panel: Recent Labs  Lab 11/15/20 0912 11/16/20 0405 11/17/20 0748 11/18/20 0805 11/19/20 0803 11/20/20 0309 11/21/20 0229  NA 139 140 141 139 140 142 138  K 4.0 4.3 3.5 2.8* 3.8 3.7 3.9  CL 101 100 95* 93* 96* 96* 95*  CO2 27 30 34* 33* 34* 34* 31  GLUCOSE 141* 103* 86  94 91 103* 106*  BUN 40* 36* 37* 36* 33* 34* 32*  CREATININE 2.18* 1.99* 1.95* 1.78* 1.80* 1.85* 1.68*  CALCIUM 9.1 9.1 9.0 9.3 9.3 9.4 8.9  MG 1.8 1.8 1.8  --  1.7  --   --    Liver Function Tests: Recent Labs  Lab 11/17/20 0748  AST 53*  ALT 42  ALKPHOS 87  BILITOT 2.3*  PROT 6.0*  ALBUMIN 2.8*   No results for input(s): LIPASE, AMYLASE in the last 168 hours. No results for input(s): AMMONIA in the last 168 hours. CBC: Recent Labs  Lab 11/15/20 0912 11/16/20 0405 11/20/20 0309  WBC 4.6 5.3 4.3  NEUTROABS 3.4 3.7  --   HGB 10.9* 11.2* 11.1*  HCT 37.6 37.2 37.1  MCV 97.4 96.9 96.4  PLT 139* 131* 138*   Cardiac Enzymes: No results for input(s): CKTOTAL, CKMB, CKMBINDEX, TROPONINI in the last 168 hours. BNP: Invalid input(s): POCBNP CBG: No results for input(s): GLUCAP in the last 168 hours. D-Dimer No results for input(s): DDIMER in the last 72 hours. Hgb A1c No results for input(s): HGBA1C in the last 72 hours. Lipid Profile No results for input(s): CHOL, HDL, LDLCALC, TRIG, CHOLHDL, LDLDIRECT in the last 72 hours. Thyroid function studies Recent Labs    11/20/20 0309  TSH 2.834  Anemia work up No results for input(s): VITAMINB12, FOLATE, FERRITIN, TIBC, IRON, RETICCTPCT in the last 72 hours. Urinalysis    Component Value Date/Time   COLORURINE YELLOW 11/07/2020 0552   APPEARANCEUR CLEAR 11/07/2020 0552   LABSPEC 1.015 11/07/2020 0552   PHURINE 5.0 11/07/2020 0552   GLUCOSEU NEGATIVE 11/07/2020 0552   HGBUR NEGATIVE 11/07/2020 0552   BILIRUBINUR NEGATIVE 11/07/2020 0552   KETONESUR NEGATIVE 11/07/2020 0552   PROTEINUR NEGATIVE 11/07/2020 0552   NITRITE POSITIVE (A) 11/07/2020 0552   LEUKOCYTESUR MODERATE (A) 11/07/2020 0552   Sepsis Labs Invalid input(s): PROCALCITONIN,  WBC,  LACTICIDVEN Microbiology No results found for this or any previous visit (from the past 240 hour(s)).   Time coordinating discharge: Over 30 minutes  SIGNED:   Darliss Cheney, MD  [facility] 11/21/2020, 1:46 PM  If 7PM-7AM, please contact night-coverage [email or web address]

## 2020-11-21 NOTE — Care Management Important Message (Signed)
Important Message  Patient Details  Name: [name] MRN: [id number] Date of Birth: September 05, 1930   Medicare Important Message Given:  Yes     [name] 11/21/2020, 9:56 AM

## 2020-11-21 NOTE — Telephone Encounter (Signed)
Patient's daughter calling to speak with Rosann Auerbach. Her mother is currently admitted and does not believe that she is medically stable to be discharged but [facility] is discharging her anyway. She states her mother has been on IV lasix and was given the pill form of lasix this morning, fluid was drawn from her lungs and her swelling has gone down. She is just concerned that when her mother gets home her health will decline. She believes that Dr. Caryl Comes should be making the call when it comes to discharging her and not the MD at the hospital. Please advise.

## 2020-11-21 NOTE — Telephone Encounter (Signed)
Spoke with pt's, daughter, Donna,DPR who states pt is scheduled for d/c today.  Dr Johnsie Cancel did come by to access pt's fluid status and medication.  She reports Dr Johnsie Cancel will start pt on Torsemide.  Pt is scheduled for f/u with Oda Kilts, PA-C on 11/30/2020.  Pt's daughter advised Dr Caryl Comes is in surgery today and it will be up to the Hospitalist when and if pt is discharged.  Pt's daughter advised to call [facility] with any questions or concerns.  Pt's daughter verbalizes understanding and agrees with current plan.

## 2020-11-21 NOTE — Progress Notes (Addendum)
Progress Note  [name] Date of Encounter: 11/21/2020  [facility] Cardiologist: Virl Axe, MD   Subjective   Pt resting comfortably in bed without oxygen. Feels well, she would like to discharge home.   Inpatient Medications    Scheduled Meds: . ALPRAZolam  0.5 mg Oral QHS  . amiodarone  200 mg Oral BID  . bisacodyl  10 mg Rectal Once  . busPIRone  5 mg Oral BID  . potassium chloride  20 mEq Oral Daily  . Rivaroxaban  15 mg Oral Q supper  . sodium chloride flush  3 mL Intravenous Q12H  . torsemide  40 mg Oral Daily   Continuous Infusions: . sodium chloride     PRN Meds: sodium chloride, acetaminophen, lidocaine (PF), ondansetron (ZOFRAN) IV, sodium chloride flush   Vital Signs    Vitals:   11/20/20 0649 11/20/20 2125 11/20/20 2153 11/21/20 0529  BP: 120/74 121/61  117/71  Pulse: 69 69 72   Resp: 18     Temp: (!) 97.4 F (36.3 C) 98.2 F (36.8 C)  98.1 F (36.7 C)  TempSrc: Oral Oral  Oral  SpO2: 98% 90% 94% 93%  Weight: 62 kg   62.2 kg  Height:        Intake/Output Summary (Last 24 hours) at 11/21/2020 0716 Last data filed at 11/21/2020 0530 Gross per 24 hour  Intake 1666 ml  Output 750 ml  Net 916 ml   Last 3 Weights 11/21/2020 11/20/2020 11/19/2020  Weight (lbs) 137 lb 1.6 oz 136 lb 11 oz 138 lb 3.2 oz  Weight (kg) 62.188 kg 62 kg 62.687 kg      Telemetry    Paced rhythm - Personally Reviewed  ECG    No new tracings - Personally Reviewed  Physical Exam   GEN: No acute distress.   Neck: No JVD Cardiac: RRR + systolic murmur Respiratory: clear on left, occasional crackle in right base GI: Soft, nontender, non-distended  MS: minimal B LE edema; No deformity. Neuro:  Nonfocal  Psych: Normal affect   Labs    High Sensitivity Troponin:  No results for input(s): TROPONINIHS in the last 720 hours.    Chemistry Recent Labs  Lab 11/17/20 0748 11/18/20 0805 11/19/20 0803 11/20/20 0309 11/21/20 0229  NA 141   < >  140 142 138  K 3.5   < > 3.8 3.7 3.9  CL 95*   < > 96* 96* 95*  CO2 34*   < > 34* 34* 31  GLUCOSE 86   < > 91 103* 106*  BUN 37*   < > 33* 34* 32*  CREATININE 1.95*   < > 1.80* 1.85* 1.68*  CALCIUM 9.0   < > 9.3 9.4 8.9  PROT 6.0*  --   --   --   --   ALBUMIN 2.8*  --   --   --   --   AST 53*  --   --   --   --   ALT 42  --   --   --   --   ALKPHOS 87  --   --   --   --   BILITOT 2.3*  --   --   --   --   GFRNONAA 24*   < > 27* 26* 29*  ANIONGAP 12   < > 10 12 12    < > = values in this interval not displayed.     Hematology  Recent Labs  Lab 11/15/20 0912 11/16/20 0405 11/20/20 0309  WBC 4.6 5.3 4.3  RBC 3.86* 3.84* 3.85*  HGB 10.9* 11.2* 11.1*  HCT 37.6 37.2 37.1  MCV 97.4 96.9 96.4  MCH 28.2 29.2 28.8  MCHC 29.0* 30.1 29.9*  RDW 16.7* 16.6* 17.1*  PLT 139* 131* 138*    BNPNo results for input(s): BNP, PROBNP in the last 168 hours.   DDimer No results for input(s): DDIMER in the last 168 hours.   Radiology    No results found.  Cardiac Studies   Echo 11/15/20: 1. LVEF slightly decreased from the prior study, now 45-50% with diffuse  hypokinesis, otherwise no change from the prior study on 11/07/2020.  2. Left ventricular ejection fraction, by estimation, is 45 to 50%. The  left ventricle has mildly decreased function. The left ventricle  demonstrates global hypokinesis.  3. Right ventricular systolic function is moderately reduced. The right  ventricular size is moderately enlarged. There is mildly elevated  pulmonary artery systolic pressure. The estimated right ventricular  systolic pressure is 13.2 mmHg.  4. Left atrial size was severely dilated.  5. Right atrial size was moderately dilated.  6. A small pericardial effusion is present. The pericardial effusion is  posterior to the left ventricle. Moderate pleural effusion in the left  lateral region.  7. Moderate mitral regurgitation however can be underestimated due to  eccentric jet. The mitral  valve is degenerative. Moderate mitral valve  regurgitation. No evidence of mitral stenosis. There is moderate prolapse  of multiple segments of the anterior  leaflet of the mitral valve. Moderate mitral annular calcification.  8. Tricuspid valve regurgitation is severe.  9. The aortic valve is normal in structure. There is moderate  calcification of the aortic valve. There is moderate thickening of the  aortic valve. Aortic valve regurgitation is trivial. Mild to moderate  aortic valve sclerosis/calcification is present,  without any evidence of aortic stenosis.  10. The inferior vena cava is dilated in size with <50% respiratory  variability, suggesting right atrial pressure of 15 mmHg.   Patient Profile     87 y.o. female with history of NICM and chronic systolic CHF, persistent atrial fib on amiodarone therapy, high grade AV block with pacemaker, hyperthyroidism, mitral valve prolapse, anemia, LE edema, CKD stage III-IV. She had a drop in her EF in 02/2020 felt possibly pacemaker induced therefore PPM upgraded to CRT-P in 05/2020 with subsequent recovery in LV function 07/2020. She was recently seen as OP for worsening edema with OP diuretic adjustment She did not improve and presented 11/06/20 with worsening edema, SOB, DOE, orthopnea. 2D echo 11/07/20 showed EF 50-55%, moderate RV function, mildly elevated PASP, moderate MR with significant prolapse, moderate TR, dilated IVC. Limited echo 11/16/19 showed EF 45-50%, global HK, moderately reduced RV function with mildly elevated PASP, severe LAE, moderate RAE, small pericardial effusion, moderate MR with +MVP, dilated IVC.  Assessment & Plan    Acute on chronic systolic heart failure Moderate RV dysfunction - EF 45-50% - diuresing on 40 mg torsemide daily - did not receive yesterday because she had already received IV medication, first dose this morning - overall net negative 6L and down 16 lbs - 137 lbs today - breathing much better -  comfortable on room air - will follow output this morning    Pleural effusion - s/p thoracentesis 11/16/20 with 400 cc removed - breathing much improved   Acute hypoxic respiratory failure - evaluate for home O2 need, per  primary - in 90s on room air   Mitral regurgitation with mitral valve prolapse, severe tricuspid regurgitation - per previous notes, TR may be from pacer wire - recommend medical management, will hold off on invasive TEE to evaluate MR   Persistent Afib Chronic anticoagulation - xarelto dose adjusted for renal function - home 100 mg amiodarone has been increased to 200 mg BID amiodarone - consider TSH and LFTs as outpatient - will continue this dose of amiodarone until evaluated with EP at follow up appt   SSS s/p PPM with BiV upgrade 05/2020 - interrogation this admission showed 100% V-pacing and optivol with fluid overload   CKD stage III-IV - sCr improving to 1.68  - baseline ?1.8-2.0   Disposition Will give PO torsemide this morning and monitor output and pressure. If urine output and pressure are stable, can consider discharge this afternoon. She has close follow up with EP next Friday.      For questions or updates, please contact [place] Please consult [email or web address] for contact info under        Signed, Ledora Bottcher, PA  11/21/2020, 7:16 AM     Attending Note:   The patient was seen and examined.  Agree with assessment and plan as noted above.  Changes made to the above note as needed.  Patient seen and independently examined with  Doreene Adas, PA .   We discussed all aspects of the encounter. I agree with the assessment and plan as stated above.  1.   CHF: We changed her IV Lasix to oral torsemide this morning.  I actually think she is volume depleted at this point.  We will allow her to eat lunch and hopefully see if she will urinate on the oral dose of torsemide.  It may not be much urine since he is somewhat volume depleted  at present.  I reassured the family that we will be keeping a close eye on her.  I encouraged them to get daily weights on her.  She will be seeing Oda Kilts, PA next Friday for EP follow-up.  We will be able to assess her volume with her biventricular pacer.  We had a long discussion about the physiology of diuretics causing acute renal injury.  I explained to her that this is physiology and that we cannot remove that possibility.  We will make sure that we keep a close eye on her.  She will need a basic metabolic profile when we see her next week.  2.  Mitral regurgitation with tricuspid regurgitation.  I would recommend medical therapy.  I would hold off on doing a TEE at this point because do not think that she is a good surgical candidate.  She may be a candidate for MitraClip but I will defer to Dr. Caryl Comes.    3.  Persistent atrial fibrillation: We have increased her amiodarone.  She will continue continue this until next week.  I anticipate reducing the dose at that visit.     I have spent a total of 40 minutes with patient reviewing hospital  notes , telemetry, EKGs, labs and examining patient as well as establishing an assessment and plan that was discussed with the patient. > 50% of time was spent in direct patient care.    Thayer Headings, Brooke Bonito., MD, [facility] 11/21/2020, 11:22 AM [address]  [address] Office [phone] Pager [phone]  Addendum: Pt has urinated twice after the PO torsemide. She feels well  and is clear for DC  I will arrange for [facility] ( home health for CHF) to visit her Follow up with Jonni Sanger in 9 days     Mertie Moores, MD  11/21/2020 1:52 PM    [address] [place],  [place], [place]  [address] Phone: [phone]; Fax: [phone]

## 2020-11-22 ENCOUNTER — Other Ambulatory Visit: Payer: Self-pay

## 2020-11-23 DIAGNOSIS — I5032 Chronic diastolic (congestive) heart failure: Secondary | ICD-10-CM | POA: Diagnosis not present

## 2020-11-26 ENCOUNTER — Other Ambulatory Visit: Payer: Self-pay | Admitting: *Deleted

## 2020-11-26 ENCOUNTER — Encounter: Payer: Self-pay | Admitting: *Deleted

## 2020-11-26 NOTE — Patient Outreach (Addendum)
[place] [facility]) Care Management  11/26/2020  [name] Nov 29, 1929 [id number]   New Referral 11/23/2020 Post d/c 1/12 Primary provider to complete transition of care. Telephone Assessment Successful Enrolled HF  RN spoke with the pt and provider permission to speak with her daughter [name]). Discussed [facility] services and the purpose for today's call. Daughter receptive as Therapist, sports further engaged on pt's recent hospitalization for CHF. RN inquired on post op follow up from the provider's office. Inquired further on pt's ongoing management of health by verifying all upcomping appointment pending primary this Thursday. RN further offered to review all prescription medications (receptive). Verified pt has all medications and with no needed refills. Caregiver aware of pt's HF medication and confirms pt taking according to how it was prescribed.  Offered to enroll pt into the [facility] for HF management and daughter was very receptive to enrollment.   Generated a plan of care related to Heart Failure along with goals and interventions. Educated on HF action plan and educated caregiver on when to seek medical attention based upon pt's symptoms. Educated on if pt gains 3 lbs overnight or 5 lbs within one week to contact the pt's provider for intervention if pt is symptomatic. Will send HF packet and calendar based upon the education provided today. Will notify pt's provider of pt's disposition with [facility] services and send the appropriate Welcome and Successful outreach letter to pt. Caregiver very appreciative and grateful for the information provided today and very receptive again to the discussed plan noted.   Will follow up in a few weeks on pt's ongoing recovery and continue with monthly outreach calls.   Goals Addressed            This Visit's Progress   . -Track and Manage Fluids and Swelling-Heart Failure       Follow Up Date 12/27/2020 Timeframe:  Short-term Priority:   High Start Date:    11/26/2020                         Expected End Date:    2/29/2022                    - call office if I gain more than 2 pounds in one day or 5 pounds in one week - do ankle pumps when sitting - keep legs up while sitting - track weight in diary - use salt in moderation - watch for swelling in feet, ankles and legs every day - weigh myself daily    Why is this important?    It is important to check your weight daily and watch how much salt and liquids you have.   It will help you to manage your heart failure.    Notes:     . [facility]-Learn More About My Health       Follow Up Date : 12/27/2020 Timeframe:  Long-Range Goal Priority:  Medium Start Date:    11/26/2020                         Expected End Date:    03/08/2021                       - make a list of questions - ask questions - repeat what I heard to make sure I understand - bring a list of my medicines to the visit - speak up  when I don't understand    Why is this important?    The best way to learn about your health and care is by talking to the doctor and nurse.   They will answer your questions and give you information in the way that you like best.    Notes: Discussed on medical conditions and how caregiver is assisting with managing those condition. Educated accordingly on any condition of topic review in pt's history.     . [facility]-Track and Manage Symptoms-Heart Failure       Follow up Date:  12/27/2020 Timeframe:  Short-Term Goal Priority:  High Start Date:    11/26/2020                         Expected End Date:  2/29/2022                        - begin a heart failure diary - bring diary to all appointments - develop a rescue plan - follow rescue plan if symptoms flare-up - know when to call the doctor - track symptoms and what helps feel better or worse    Why is this important?    You will be able to handle your symptoms better if you keep track of them.   Making some simple  changes to your lifestyle will help.   Eating healthy is one thing you can do to take good care of yourself.    Notes: Will send HF packet and calender with education material and discuss what to do if acute needs are encountered.        Raina Mina, RN Care Management Coordinator [place] Office [phone]

## 2020-11-28 ENCOUNTER — Ambulatory Visit (INDEPENDENT_AMBULATORY_CARE_PROVIDER_SITE_OTHER): Payer: Medicare Other

## 2020-11-28 DIAGNOSIS — I4819 Other persistent atrial fibrillation: Secondary | ICD-10-CM

## 2020-11-29 LAB — CUP PACEART REMOTE DEVICE CHECK
Battery Remaining Longevity: 55 mo
Battery Voltage: 2.99 V
Brady Statistic AP VP Percent: 48.96 %
Brady Statistic AP VS Percent: 0 %
Brady Statistic AS VP Percent: 51.14 %
Brady Statistic AS VS Percent: 0.05 %
Brady Statistic RA Percent Paced: 3.27 %
Brady Statistic RV Percent Paced: 97.5 %
Date Time Interrogation Session: 20220118210504
Implantable Lead Implant Date: 20180226
Implantable Lead Implant Date: 20180226
Implantable Lead Implant Date: 20210721
Implantable Lead Location: 753858
Implantable Lead Location: 753859
Implantable Lead Location: 753860
Implantable Lead Model: 5076
Implantable Lead Model: 5076
Implantable Pulse Generator Implant Date: 20210721
Lead Channel Impedance Value: 1026 Ohm
Lead Channel Impedance Value: 1121 Ohm
Lead Channel Impedance Value: 1159 Ohm
Lead Channel Impedance Value: 285 Ohm
Lead Channel Impedance Value: 342 Ohm
Lead Channel Impedance Value: 399 Ohm
Lead Channel Impedance Value: 418 Ohm
Lead Channel Impedance Value: 456 Ohm
Lead Channel Impedance Value: 475 Ohm
Lead Channel Impedance Value: 551 Ohm
Lead Channel Impedance Value: 703 Ohm
Lead Channel Impedance Value: 817 Ohm
Lead Channel Impedance Value: 893 Ohm
Lead Channel Impedance Value: 931 Ohm
Lead Channel Pacing Threshold Amplitude: 0.875 V
Lead Channel Pacing Threshold Amplitude: 1.125 V
Lead Channel Pacing Threshold Amplitude: 3.75 V
Lead Channel Pacing Threshold Pulse Width: 0.4 ms
Lead Channel Pacing Threshold Pulse Width: 0.4 ms
Lead Channel Pacing Threshold Pulse Width: 0.8 ms
Lead Channel Sensing Intrinsic Amplitude: 1.5 mV
Lead Channel Sensing Intrinsic Amplitude: 1.5 mV
Lead Channel Sensing Intrinsic Amplitude: 11.75 mV
Lead Channel Sensing Intrinsic Amplitude: 13.375 mV
Lead Channel Setting Pacing Amplitude: 2.5 V
Lead Channel Setting Pacing Amplitude: 2.5 V
Lead Channel Setting Pacing Amplitude: 3.5 V
Lead Channel Setting Pacing Pulse Width: 0.4 ms
Lead Channel Setting Pacing Pulse Width: 1.2 ms
Lead Channel Setting Sensing Sensitivity: 0.9 mV

## 2020-11-30 ENCOUNTER — Encounter: Payer: Self-pay | Admitting: Student

## 2020-11-30 ENCOUNTER — Other Ambulatory Visit: Payer: Self-pay

## 2020-11-30 ENCOUNTER — Ambulatory Visit: Payer: Medicare Other | Admitting: Student

## 2020-11-30 VITALS — BP 110/60 | HR 60 | Ht 62.0 in | Wt 127.0 lb

## 2020-11-30 DIAGNOSIS — I442 Atrioventricular block, complete: Secondary | ICD-10-CM

## 2020-11-30 DIAGNOSIS — Z950 Presence of cardiac pacemaker: Secondary | ICD-10-CM | POA: Diagnosis not present

## 2020-11-30 DIAGNOSIS — I4819 Other persistent atrial fibrillation: Secondary | ICD-10-CM | POA: Diagnosis not present

## 2020-11-30 DIAGNOSIS — I495 Sick sinus syndrome: Secondary | ICD-10-CM

## 2020-11-30 DIAGNOSIS — E032 Hypothyroidism due to medicaments and other exogenous substances: Secondary | ICD-10-CM

## 2020-11-30 LAB — CUP PACEART INCLINIC DEVICE CHECK
Date Time Interrogation Session: 20220121112953
Implantable Lead Implant Date: 20180226
Implantable Lead Implant Date: 20180226
Implantable Lead Implant Date: 20210721
Implantable Lead Location: 753858
Implantable Lead Location: 753859
Implantable Lead Location: 753860
Implantable Lead Model: 5076
Implantable Lead Model: 5076
Implantable Pulse Generator Implant Date: 20210721

## 2020-11-30 NOTE — Patient Instructions (Signed)
Medication Instructions:  Your physician recommends that you continue on your current medications as directed. Please refer to the Current Medication list given to you today.  *If you need a refill on your cardiac medications before your next appointment, please call your pharmacy*   Lab Work: Your physician recommends that you return for lab work on 12/03/2020. You can arrive anytime between 7:30 AM and 4:30 PM  If you have labs (blood work) drawn today and your tests are completely normal, you will receive your results only by:  MyChart Message (if you have MyChart) OR  A paper copy in the mail If you have any lab test that is abnormal or we need to change your treatment, we will call you to review the results.   Testing/Procedures: Due to recent COVID-19 restrictions implemented by our local and state authorities and in an effort to keep both patients and staff as safe as possible, our hospital system requires COVID-19 testing prior to certain scheduled hospital procedures.  Please go to [place]. [place], [place] [address] on 12/03/2020 at 12:25 PM .  This is a drive up testing site.  You will not need to exit your vehicle.  You will not be billed at the time of testing but may receive a bill later depending on your insurance.  The approximate cost of the test is $100.  You must agree to self-quarantine from the time of your testing until the procedure date on 12/06/2020.  This should included staying home with ONLY the people you live with.  Avoid take-out, grocery store shopping or leaving the house for any non-emergent reason.  Failure to have your COVID-19 test done on the date and time you have been scheduled will result in cancellation of your procedure.  Please call our office at [phone] if you have any questions.    Follow-Up: Follow up with the A-Fib clinic after cardioversion    Other Instructions  You are scheduled for a  Cardioversion on 12/06/2020 with Dr. [name].  Please arrive at the [facility] (Main Entrance A) at [facility]: [address] [place],  [address] at 12:30 pm. (1 hour prior to procedure)  DIET: Nothing to eat or drink after midnight except a sip of water with medications (see medication instructions below)  Medication Instructions: Hold Torsemide the morning of your procedure  Continue your anticoagulant: Xarelto You will need to continue your anticoagulant after your procedure until you are told by your Provider that it is safe to stop   You must have a responsible person to drive you home and stay in the waiting area during your procedure. Failure to do so could result in cancellation.  Bring your insurance cards.  *Special Note: Every effort is made to have your procedure done on time. Occasionally there are emergencies that occur at the hospital that may cause delays. Please be patient if a delay does occur.

## 2020-11-30 NOTE — Progress Notes (Signed)
cardio   Electrophysiology Office Note Date: 11/30/2020  ID:  [name], DOB 05-21-1930, MRN [id number]  PCP: Chesley Noon, MD Primary Cardiologist: Virl Axe, MD Electrophysiologist: Virl Axe, MD   CC: Pacemaker follow-up  [name] is a 87 y.o. female seen today for Virl Axe, MD for post hospital follow up.  Since discharge from hospital the patient reports doing very well. She is back at home and being followed closely by a program called [facility]. She has had continued weight loss, hypokalemia, and mild AKI on torsemide. They have already reached out and adjusted her dosing and added potassium. She does not particularly notice that she is in AF, but some of this may be confounded by fatigue after a prolonged admission. she denies chest pain, palpitations, PND, nausea, vomiting, dizziness, syncope, edema, weight gain, or early satiety.  Device History: Medtronic Dual Chamber PPM implanted 12/2016, upgraded to BIV 05/2020 for CHB/NICM  Past Medical History:  Diagnosis Date   Anxiety    Aortic valve sclerosis    Atrial fibrillation (HCC)    Basal cell carcinoma    hx; near eyes   Chronic systolic CHF (congestive heart failure) ([place])    Echocardiogram 03/2020: EF 25-30, mid-dist lat, dist ant, mid-dist inf, mid-dist septal and apical severe HK/AK, Gr 2 DD, mild reduced RVSF, RVSP 41.8, severe LAE, mod RAE, mild to mod MR, mild to mod TR, AoV sclerosis (no AS)   GERD (gastroesophageal reflux disease)    H/O one miscarriage    Hard of hearing    has hearing aides but does not wear    History of hiatal hernia    History of kidney stones    Hyperlipidemia    Hypertension    Mitral valve regurgitation 05/2020   Moderate   OA (osteoarthritis)    "right pointer; right hip" (01/05/2017)   Obesity    Renal insufficiency    SSS (sick sinus syndrome) ([place]) 01/05/2017   a. s/p MDT dual chamber PPM    Tinnitus    Tricuspid valve regurgitation  05/2020   Mod   Varicose veins    Wears glasses    Past Surgical History:  Procedure Laterality Date   BIV UPGRADE N/A 05/30/2020   Procedure: BIV PPM UPGRADE;  Surgeon: Deboraha Sprang, MD;  Location: [place] [facility];  Service: Cardiovascular;  Laterality: N/A;   BREAST CYST EXCISION Right 1977   "benign"   CARDIOVERSION N/A 07/25/2016   Procedure: CARDIOVERSION;  Surgeon: Lelon Perla, MD;  Location: [place];  Service: Cardiovascular;  Laterality: N/A;   CARDIOVERSION N/A 01/20/2019   Procedure: CARDIOVERSION;  Surgeon: Buford Dresser, MD;  Location: [facility];  Service: Cardiovascular;  Laterality: N/A;   CARDIOVERSION N/A 12/05/2019   Procedure: CARDIOVERSION;  Surgeon: Dorothy Spark, MD;  Location: [facility] ENDOSCOPY;  Service: Cardiovascular;  Laterality: N/A;   CARDIOVERSION N/A 05/07/2020   Procedure: CARDIOVERSION;  Surgeon: Acie Fredrickson Wonda Cheng, MD;  Location: [place];  Service: Cardiovascular;  Laterality: N/A;   CATARACT EXTRACTION W/ INTRAOCULAR LENS  IMPLANT, BILATERAL Bilateral    DILATION AND CURETTAGE OF UTERUS     IR THORACENTESIS ASP PLEURAL SPACE W/IMG GUIDE  11/16/2020   NASAL SEPTUM SURGERY     OVARIAN CYST REMOVAL  2002   PACEMAKER IMPLANT N/A 01/05/2017   Procedure: Pacemaker Implant;  Surgeon: Will Meredith Leeds, MD;  Location: [place] [facility];  Service: Cardiovascular;  Laterality: N/A;   TEE WITHOUT CARDIOVERSION N/A 12/16/2016  Procedure: TRANSESOPHAGEAL ECHOCARDIOGRAM (TEE);  Surgeon: Larey Dresser, MD;  Location: [place];  Service: Cardiovascular;  Laterality: N/A;   TEE WITHOUT CARDIOVERSION N/A 01/20/2019   Procedure: TRANSESOPHAGEAL ECHOCARDIOGRAM (TEE);  Surgeon: Buford Dresser, MD;  Location: [place];  Service: Cardiovascular;  Laterality: N/A;   TONSILLECTOMY     TOTAL HIP ARTHROPLASTY Left 12/18/2015   Procedure: LEFT TOTAL HIP ARTHROPLASTY ANTERIOR APPROACH;  Surgeon: Paralee Cancel, MD;   Location: [facility];  Service: Orthopedics;  Laterality: Left;    Current Outpatient Medications  Medication Sig Dispense Refill   acetaminophen (TYLENOL) 500 MG tablet Take 500-1,000 mg by mouth every 8 (eight) hours as needed for mild pain.     ALPRAZolam (XANAX) 0.5 MG tablet Take 0.5 mg by mouth at bedtime.     amiodarone (PACERONE) 200 MG tablet Take 1 tablet (200 mg total) by mouth 2 (two) times daily. 60 tablet 0   busPIRone (BUSPAR) 5 MG tablet Take 5 mg by mouth 2 (two) times daily.     Cyanocobalamin (B-12) 2500 MCG TABS Take 2,500 mcg by mouth every other day.      Magnesium 400 MG CAPS Take 400 mg by mouth 3 (three) times a week.      Multiple Vitamin (MULTIVITAMIN WITH MINERALS) TABS tablet Take 1 tablet by mouth daily.     potassium chloride (KLOR-CON) 10 MEQ tablet Take 10 mEq by mouth daily.     Propylene Glycol (SYSTANE COMPLETE) 0.6 % SOLN Place 1 drop into both eyes 2 (two) times daily as needed (for dryness).     torsemide (DEMADEX) 20 MG tablet Take 20 mg by mouth daily.     XARELTO 15 MG TABS tablet TAKE 1 TABLET(15 MG) BY MOUTH DAILY WITH SUPPER 30 tablet 5   No current facility-administered medications for this visit.    Allergies:   Ace inhibitors, Amlodipine, Celebrex [celecoxib], Codeine, Rofecoxib, Ether for anesthesia [ether], and Norvasc [amlodipine besylate]   Social History: Social History   Socioeconomic History   Marital status: Married    Spouse name: Not on file   Number of children: Not on file   Years of education: Not on file   Highest education level: Not on file  Occupational History   Not on file  Tobacco Use   Smoking status: Never Smoker   Smokeless tobacco: Never Used  Vaping Use   Vaping Use: Never used  Substance and Sexual Activity   Alcohol use: No   Drug use: No   Sexual activity: Not Currently  Other Topics Concern   Not on file  Social History Narrative   Not on file   Social Determinants of Health    Financial Resource Strain: Not on file  Food Insecurity: No Food Insecurity   Worried About Running Out of Food in the Last Year: Never true   Ran Out of Food in the Last Year: Never true  Transportation Needs: No Transportation Needs   Lack of Transportation (Medical): No   Lack of Transportation (Non-Medical): No  Physical Activity: Not on file  Stress: Not on file  Social Connections: Not on file  Intimate Partner Violence: Not on file    Family History: Family History  Problem Relation Age of Onset   Diabetes Mother    Heart disease Father    Hypertension Sister    Hypertension Brother      Review of Systems: All other systems reviewed and are otherwise negative except as noted above.  Physical Exam: Vitals:  11/30/20 1052  BP: 110/60  Pulse: 60  Weight: 127 lb (57.6 kg)  Height: 5\' 2"  (1.575 m)     GEN- The patient is well appearing, alert and oriented x 3 today.   HEENT: normocephalic, atraumatic; sclera clear, conjunctiva pink; hearing intact; oropharynx clear; neck supple  Lungs- Clear to ausculation bilaterally, normal work of breathing.  No wheezes, rales, rhonchi Heart- Regular rate and rhythm, no murmurs, rubs or gallops  GI- soft, non-tender, non-distended, bowel sounds present  Extremities- no clubbing or cyanosis. No edema MS- no significant deformity or atrophy Skin- warm and dry, no rash or lesion; PPM pocket well healed Psych- euthymic mood, full affect Neuro- strength and sensation are intact  PPM Interrogation- reviewed in detail today,  See PACEART report  EKG:  EKG is not ordered today.  Recent Labs: 03/02/2020: NT-Pro BNP 3,278 11/06/2020: B Natriuretic Peptide 1,049.9 11/17/2020: ALT 42 11/19/2020: Magnesium 1.7 11/20/2020: Hemoglobin 11.1; Platelets 138; TSH 2.834 11/21/2020: BUN 32; Creatinine, Ser 1.68; Potassium 3.9; Sodium 138   Wt Readings from Last 3 Encounters:  11/30/20 127 lb (57.6 kg)  11/21/20 137 lb 1.6 oz (62.2  kg)  11/01/20 150 lb (68 kg)     Other studies Reviewed: Additional studies/ records that were reviewed today include: Previous EP office notes, Previous remote checks, Most recent labwork.   Assessment and Plan:  1. Symptomatic bradycardia s/p Medtronic PPM -> BIV upgrade Normal PPM function See Pace Art report No changes today   2. Acute on chronic systolic CHF Echo 1/69/6789 with newly reduced EF at 25-30% (TEE 01/2019 with normal EF) EF improved within 2 months of LV lead addition, EF 55-60% 07/2020 -> down slightly to 45-50 with recent admission NYHAIII currently. She sleeps in a recliner chronically due to her hips  Volume status stable.  Continue torsemide 20 mg daily. (just changed) Continue potassium 10 meq daily (just changed 11/30/19 with K 3.4) Continue coreg 6.25 mg BID Off hydralazine/ imdur with hypotension.  Avoid ACE/ARB with CKD Would avoid digoxin with CKD  3. CKD III-IV Cr 1.99 and K 3.4 on labs yesterday.  No spiro. Avoid digoxin Would only use ACE/ARB/ARNI very cautiously. For now, substituting for imdur/hydralazine for afterload reduction.   4. Persistent AF She is on amiodarone 200 mg BID. Will add surveillance labs to her pre procedure labwork.  Remains in rate controlled AF today, likely from stress from recent admission. She has not missed any Xarelto, including with thoracentesis per [facility] review.  Historically she has tolerated AF poorly.  TSH stable 09/06/2020 Will plan [facility] next week and f/u in AF clinic   Current medicines are reviewed at length with the patient today.   The patient does not have concerns regarding her medicines.  The following changes were made today:  none  Labs/ tests ordered today include:  Orders Placed This Encounter  Procedures   [facility] [place]   Disposition:   [place] next week, f/u AF clinic after. If she continues to have AF, may need to change to rate control approach.   Jacalyn Lefevre, PA-C  11/30/2020 11:38 AM  [facility] [address] [place] [address] [phone] (office) [phone] (fax)

## 2020-12-03 ENCOUNTER — Other Ambulatory Visit: Payer: Self-pay

## 2020-12-03 ENCOUNTER — Other Ambulatory Visit: Payer: Medicare Other | Admitting: *Deleted

## 2020-12-03 ENCOUNTER — Other Ambulatory Visit (HOSPITAL_COMMUNITY)
Admission: RE | Admit: 2020-12-03 | Discharge: 2020-12-03 | Disposition: A | Discharge status: Home / Self Care | Payer: Medicare Other | Source: Ambulatory Visit | Attending: Cardiology | Admitting: Cardiology

## 2020-12-03 DIAGNOSIS — Z01812 Encounter for preprocedural laboratory examination: Secondary | ICD-10-CM | POA: Insufficient documentation

## 2020-12-03 DIAGNOSIS — Z20822 Contact with and (suspected) exposure to covid-19: Secondary | ICD-10-CM | POA: Insufficient documentation

## 2020-12-03 DIAGNOSIS — I4819 Other persistent atrial fibrillation: Secondary | ICD-10-CM

## 2020-12-03 LAB — COMPREHENSIVE METABOLIC PANEL
ALT: 25 IU/L (ref 0–32)
AST: 38 IU/L (ref 0–40)
Albumin/Globulin Ratio: 1.4 (ref 1.2–2.2)
Albumin: 4.1 g/dL (ref 3.6–4.6)
Alkaline Phosphatase: 143 IU/L — ABNORMAL HIGH (ref 44–121)
BUN/Creatinine Ratio: 12 (ref 12–28)
BUN: 27 mg/dL (ref 8–27)
Bilirubin Total: 2.8 mg/dL — ABNORMAL HIGH (ref 0.0–1.2)
CO2: 26 mmol/L (ref 20–29)
Calcium: 9.9 mg/dL (ref 8.7–10.3)
Chloride: 101 mmol/L (ref 96–106)
Creatinine, Ser: 2.17 mg/dL — ABNORMAL HIGH (ref 0.57–1.00)
GFR calc Af Amer: 23 mL/min/{1.73_m2} — ABNORMAL LOW (ref >59)
GFR calc non Af Amer: 20 mL/min/{1.73_m2} — ABNORMAL LOW (ref >59)
Globulin, Total: 3 g/dL (ref 1.5–4.5)
Glucose: 152 mg/dL — ABNORMAL HIGH (ref 65–99)
Potassium: 3.9 mmol/L (ref 3.5–5.2)
Sodium: 143 mmol/L (ref 134–144)
Total Protein: 7.1 g/dL (ref 6.0–8.5)

## 2020-12-03 LAB — CBC
Hematocrit: 36.1 % (ref 34.0–46.6)
Hemoglobin: 11.7 g/dL (ref 11.1–15.9)
MCH: 29.3 pg (ref 26.6–33.0)
MCHC: 32.4 g/dL (ref 31.5–35.7)
MCV: 90 fL (ref 79–97)
Platelets: 182 10*3/uL (ref 150–450)
RBC: 4 x10E6/uL (ref 3.77–5.28)
RDW: 15.6 % — ABNORMAL HIGH (ref 11.7–15.4)
WBC: 4.6 10*3/uL (ref 3.4–10.8)

## 2020-12-03 LAB — SARS CORONAVIRUS 2 (TAT 6-24 HRS): SARS Coronavirus 2: NEGATIVE

## 2020-12-03 LAB — TSH: TSH: 2.57 u[IU]/mL (ref 0.450–4.500)

## 2020-12-04 ENCOUNTER — Other Ambulatory Visit: Payer: Medicare Other

## 2020-12-04 ENCOUNTER — Telehealth: Payer: Self-pay | Admitting: *Deleted

## 2020-12-04 MED ORDER — TORSEMIDE 10 MG PO TABS: 10.0000 mg | ORAL_TABLET | Freq: Every day | ORAL | 3 refills | Status: DC

## 2020-12-04 NOTE — Telephone Encounter (Signed)
-----   Message from Shirley Friar, PA-C sent at 12/04/2020  1:01 PM EST ----- Her creatinine has climbed on torsemide, though BUN is stable. Torsemide recently adjusted. Can we please have her hold two doses of torsemide then decrease to 10 mg daily?  I will place order for BMET when she comes for [facility].    Would continue potassium 10 meq daily for now. And can address further at follow up BMET

## 2020-12-04 NOTE — Telephone Encounter (Signed)
Gave the patient recommendations. Order placed.  Patient verbalized understanding

## 2020-12-05 ENCOUNTER — Ambulatory Visit: Payer: Medicare Other | Admitting: Nurse Practitioner

## 2020-12-06 ENCOUNTER — Ambulatory Visit (HOSPITAL_COMMUNITY): Payer: Medicare Other | Admitting: Anesthesiology

## 2020-12-06 ENCOUNTER — Ambulatory Visit (HOSPITAL_COMMUNITY)
Admission: RE | Admit: 2020-12-06 | Discharge: 2020-12-06 | Disposition: A | Discharge status: Home / Self Care | Payer: Medicare Other | Attending: Cardiology | Admitting: Cardiology

## 2020-12-06 ENCOUNTER — Encounter (HOSPITAL_COMMUNITY): Payer: Self-pay | Admitting: Cardiology

## 2020-12-06 ENCOUNTER — Encounter (HOSPITAL_COMMUNITY)
Admission: RE | Disposition: A | Discharge status: Home / Self Care | Payer: Self-pay | Source: Home / Self Care | Attending: Cardiology

## 2020-12-06 ENCOUNTER — Other Ambulatory Visit: Payer: Self-pay

## 2020-12-06 ENCOUNTER — Telehealth: Payer: Self-pay | Admitting: *Deleted

## 2020-12-06 DIAGNOSIS — N184 Chronic kidney disease, stage 4 (severe): Secondary | ICD-10-CM | POA: Insufficient documentation

## 2020-12-06 DIAGNOSIS — R001 Bradycardia, unspecified: Secondary | ICD-10-CM | POA: Insufficient documentation

## 2020-12-06 DIAGNOSIS — Z7901 Long term (current) use of anticoagulants: Secondary | ICD-10-CM | POA: Diagnosis not present

## 2020-12-06 DIAGNOSIS — Z950 Presence of cardiac pacemaker: Secondary | ICD-10-CM | POA: Insufficient documentation

## 2020-12-06 DIAGNOSIS — I4819 Other persistent atrial fibrillation: Secondary | ICD-10-CM

## 2020-12-06 DIAGNOSIS — Z79899 Other long term (current) drug therapy: Secondary | ICD-10-CM | POA: Insufficient documentation

## 2020-12-06 DIAGNOSIS — I5023 Acute on chronic systolic (congestive) heart failure: Secondary | ICD-10-CM | POA: Diagnosis not present

## 2020-12-06 HISTORY — PX: CARDIOVERSION: SHX1299

## 2020-12-06 LAB — BASIC METABOLIC PANEL
Anion gap: 13 (ref 5–15)
BUN: 32 mg/dL — ABNORMAL HIGH (ref 8–23)
CO2: 28 mmol/L (ref 22–32)
Calcium: 9.6 mg/dL (ref 8.9–10.3)
Chloride: 103 mmol/L (ref 98–111)
Creatinine, Ser: 2.01 mg/dL — ABNORMAL HIGH (ref 0.44–1.00)
GFR, Estimated: 23 mL/min — ABNORMAL LOW (ref >60)
Glucose, Bld: 104 mg/dL — ABNORMAL HIGH (ref 70–99)
Potassium: 3.6 mmol/L (ref 3.5–5.1)
Sodium: 144 mmol/L (ref 135–145)

## 2020-12-06 SURGERY — CARDIOVERSION
Anesthesia: General

## 2020-12-06 MED ORDER — LIDOCAINE 2% (20 MG/ML) 5 ML SYRINGE
INTRAMUSCULAR | Status: DC | PRN
  Administered 2020-12-06: 60 mg via INTRAVENOUS

## 2020-12-06 MED ORDER — SODIUM CHLORIDE 0.9 % IV SOLN: INTRAVENOUS | Status: DC | PRN

## 2020-12-06 MED ORDER — PROPOFOL 10 MG/ML IV BOLUS
INTRAVENOUS | Status: DC | PRN
  Administered 2020-12-06: 50 mg via INTRAVENOUS

## 2020-12-06 MED ORDER — POTASSIUM CHLORIDE CRYS ER 20 MEQ PO TBCR: 20.0000 meq | EXTENDED_RELEASE_TABLET | Freq: Every day | ORAL | 6 refills | Status: AC

## 2020-12-06 NOTE — Interval H&P Note (Signed)
History and Physical Interval Note:  12/06/2020 12:17 PM  [name]  has presented today for surgery, with the diagnosis of AFIB.  The various methods of treatment have been discussed with the patient and family. After consideration of risks, benefits and other options for treatment, the patient has consented to  Procedure(s): CARDIOVERSION (N/A) as a surgical intervention.  The patient's history has been reviewed, patient examined, no change in status, stable for surgery.  I have reviewed the patient's chart and labs.  Questions were answered to the patient's satisfaction.     [name]

## 2020-12-06 NOTE — Discharge Instructions (Signed)
Electrical Cardioversion Electrical cardioversion is the delivery of a jolt of electricity to restore a normal rhythm to the heart. A rhythm that is too fast or is not regular keeps the heart from pumping well. In this procedure, sticky patches or metal paddles are placed on the chest to deliver electricity to the heart from a device. This procedure may be done in an emergency if:  There is low or no blood pressure as a result of the heart rhythm.  Normal rhythm must be restored as fast as possible to protect the brain and heart from further damage.  It may save a life. This may also be a scheduled procedure for irregular or fast heart rhythms that are not immediately life-threatening. Tell a health care provider about:  Any allergies you have.  All medicines you are taking, including vitamins, herbs, eye drops, creams, and over-the-counter medicines.  Any problems you or family members have had with anesthetic medicines.  Any blood disorders you have.  Any surgeries you have had.  Any medical conditions you have.  Whether you are pregnant or may be pregnant. What are the risks? Generally, this is a safe procedure. However, problems may occur, including:  Allergic reactions to medicines.  A blood clot that breaks free and travels to other parts of your body.  The possible return of an abnormal heart rhythm within hours or days after the procedure.  Your heart stopping (cardiac arrest). This is rare. What happens before the procedure? Medicines  Your health care provider may have you start taking: ? Blood-thinning medicines (anticoagulants) so your blood does not clot as easily. ? Medicines to help stabilize your heart rate and rhythm.  Ask your health care provider about: ? Changing or stopping your regular medicines. This is especially important if you are taking diabetes medicines or blood thinners. ? Taking medicines such as aspirin and ibuprofen. These medicines can  thin your blood. Do not take these medicines unless your health care provider tells you to take them. ? Taking over-the-counter medicines, vitamins, herbs, and supplements. General instructions  Follow instructions from your health care provider about eating or drinking restrictions.  Plan to have someone take you home from the hospital or clinic.  If you will be going home right after the procedure, plan to have someone with you for 24 hours.  Ask your health care provider what steps will be taken to help prevent infection. These may include washing your skin with a germ-killing soap. What happens during the procedure?  An IV will be inserted into one of your veins.  Sticky patches (electrodes) or metal paddles may be placed on your chest.  You will be given a medicine to help you relax (sedative).  An electrical shock will be delivered. The procedure may vary among health care providers and hospitals.   What can I expect after the procedure?  Your blood pressure, heart rate, breathing rate, and blood oxygen level will be monitored until you leave the hospital or clinic.  Your heart rhythm will be watched to make sure it does not change.  You may have some redness on the skin where the shocks were given. Follow these instructions at home:  Do not drive for 24 hours if you were given a sedative during your procedure.  Take over-the-counter and prescription medicines only as told by your health care provider.  Ask your health care provider how to check your pulse. Check it often.  Rest for 48 hours after the procedure   or as told by your health care provider.  Avoid or limit your caffeine use as told by your health care provider.  Keep all follow-up visits as told by your health care provider. This is important. Contact a health care provider if:  You feel like your heart is beating too quickly or your pulse is not regular.  You have a serious muscle cramp that does not go  away. Get help right away if:  You have discomfort in your chest.  You are dizzy or you feel faint.  You have trouble breathing or you are short of breath.  Your speech is slurred.  You have trouble moving an arm or leg on one side of your body.  Your fingers or toes turn cold or blue. Summary  Electrical cardioversion is the delivery of a jolt of electricity to restore a normal rhythm to the heart.  This procedure may be done right away in an emergency or may be a scheduled procedure if the condition is not an emergency.  Generally, this is a safe procedure.  After the procedure, check your pulse often as told by your health care provider. This information is not intended to replace advice given to you by your health care provider. Make sure you discuss any questions you have with your health care provider. Document Revised: 05/30/2019 Document Reviewed: 05/30/2019 Elsevier Patient Education  2021 [employer]  

## 2020-12-06 NOTE — H&P (Signed)
Office Visit  11/30/2020 [facility], Satira Mccallum, [place]  Physician Assistant  Persistent atrial fibrillation [facility]) +4 more  Dx  Referred by Chesley Noon, MD  Reason for Visit    Additional Documentation  Vitals:  BP 110/60  Pulse 60  Ht 5\' 2"  (1.575 m)  Wt 57.6 kg  BMI 23.23 kg/m  BSA 1.59 m    More Vitals  Flowsheets:  MEWS Score,  Anthropometrics,  NEWS    Encounter Info:  Billing Info,  History,  Allergies,  Detailed Report     All Notes   Progress Notes by Shirley Friar, PA-C at 11/30/2020 10:55 AM  Author: Shirley Friar, PA-C Author Type: Physician Assistant Certified Filed: 08/02/3006 12:11 PM  Note Status: Signed Cosign: Cosign Not Required Encounter Date: 11/30/2020  Editor: Annamaria Helling (Physician Assistant Certified)               cardio   Electrophysiology Office Note Date: 11/30/2020  ID:  [name], DOB 04-29-1930, MRN [id number]  PCP: Chesley Noon, MD Primary Cardiologist: Virl Axe, MD Electrophysiologist: Virl Axe, MD   CC: Pacemaker follow-up  ABIE KILLIAN is a 87 y.o. female seen today for Virl Axe, MD for post hospital follow up.  Since discharge from hospital the patient reports doing very well. She is back at home and being followed closely by a program called [facility]. She has had continued weight loss, hypokalemia, and mild AKI on torsemide. They have already reached out and adjusted her dosing and added potassium. She does not particularly notice that she is in AF, but some of this may be confounded by fatigue after a prolonged admission. she denies chest pain, palpitations, PND, nausea, vomiting, dizziness, syncope, edema, weight gain, or early satiety.  Device History: Medtronic Dual Chamber PPM implanted 12/2016, upgraded to BIV 05/2020 for CHB/NICM      Past Medical History:  Diagnosis Date  . Anxiety   . Aortic valve  sclerosis   . Atrial fibrillation ([place])   . Basal cell carcinoma    hx; near eyes  . Chronic systolic CHF (congestive heart failure) (HCC)    Echocardiogram 03/2020: EF 25-30, mid-dist lat, dist ant, mid-dist inf, mid-dist septal and apical severe HK/AK, Gr 2 DD, mild reduced RVSF, RVSP 41.8, severe LAE, mod RAE, mild to mod MR, mild to mod TR, AoV sclerosis (no AS)  . GERD (gastroesophageal reflux disease)   . H/O one miscarriage   . Hard of hearing    has hearing aides but does not wear   . History of hiatal hernia   . History of kidney stones   . Hyperlipidemia   . Hypertension   . Mitral valve regurgitation 05/2020   Moderate  . OA (osteoarthritis)    "right pointer; right hip" (01/05/2017)  . Obesity   . Renal insufficiency   . SSS (sick sinus syndrome) ([place]) 01/05/2017   a. s/p MDT dual chamber PPM   . Tinnitus   . Tricuspid valve regurgitation 05/2020   Mod  . Varicose veins   . Wears glasses         Past Surgical History:  Procedure Laterality Date  . BIV UPGRADE N/A 05/30/2020   Procedure: BIV PPM UPGRADE;  Surgeon: Deboraha Sprang, MD;  Location: [place] CV LAB;  Service: Cardiovascular;  Laterality: N/A;  . BREAST CYST EXCISION Right 1977   "benign"  . CARDIOVERSION N/A 07/25/2016  Procedure: CARDIOVERSION;  Surgeon: Lelon Perla, MD;  Location: [facility] ENDOSCOPY;  Service: Cardiovascular;  Laterality: N/A;  . CARDIOVERSION N/A 01/20/2019   Procedure: CARDIOVERSION;  Surgeon: Buford Dresser, MD;  Location: [facility] ENDOSCOPY;  Service: Cardiovascular;  Laterality: N/A;  . CARDIOVERSION N/A 12/05/2019   Procedure: CARDIOVERSION;  Surgeon: Dorothy Spark, MD;  Location: [facility] ENDOSCOPY;  Service: Cardiovascular;  Laterality: N/A;  . CARDIOVERSION N/A 05/07/2020   Procedure: CARDIOVERSION;  Surgeon: Acie Fredrickson Wonda Cheng, MD;  Location: [place];  Service: Cardiovascular;  Laterality: N/A;  . CATARACT EXTRACTION W/ INTRAOCULAR LENS   IMPLANT, BILATERAL Bilateral   . DILATION AND CURETTAGE OF UTERUS    . IR THORACENTESIS ASP PLEURAL SPACE W/IMG GUIDE  11/16/2020  . NASAL SEPTUM SURGERY    . OVARIAN CYST REMOVAL  2002  . PACEMAKER IMPLANT N/A 01/05/2017   Procedure: Pacemaker Implant;  Surgeon: Will Meredith Leeds, MD;  Location: [place] CV LAB;  Service: Cardiovascular;  Laterality: N/A;  . TEE WITHOUT CARDIOVERSION N/A 12/16/2016   Procedure: TRANSESOPHAGEAL ECHOCARDIOGRAM (TEE);  Surgeon: Larey Dresser, MD;  Location: [place];  Service: Cardiovascular;  Laterality: N/A;  . TEE WITHOUT CARDIOVERSION N/A 01/20/2019   Procedure: TRANSESOPHAGEAL ECHOCARDIOGRAM (TEE);  Surgeon: Buford Dresser, MD;  Location: [facility] ENDOSCOPY;  Service: Cardiovascular;  Laterality: N/A;  . TONSILLECTOMY    . TOTAL HIP ARTHROPLASTY Left 12/18/2015   Procedure: LEFT TOTAL HIP ARTHROPLASTY ANTERIOR APPROACH;  Surgeon: Paralee Cancel, MD;  Location: [facility] ORS;  Service: Orthopedics;  Laterality: Left;          Current Outpatient Medications  Medication Sig Dispense Refill  . acetaminophen (TYLENOL) 500 MG tablet Take 500-1,000 mg by mouth every 8 (eight) hours as needed for mild pain.    Marland Kitchen ALPRAZolam (XANAX) 0.5 MG tablet Take 0.5 mg by mouth at bedtime.    Marland Kitchen amiodarone (PACERONE) 200 MG tablet Take 1 tablet (200 mg total) by mouth 2 (two) times daily. 60 tablet 0  . busPIRone (BUSPAR) 5 MG tablet Take 5 mg by mouth 2 (two) times daily.    . Cyanocobalamin (B-12) 2500 MCG TABS Take 2,500 mcg by mouth every other day.     . Magnesium 400 MG CAPS Take 400 mg by mouth 3 (three) times a week.     . Multiple Vitamin (MULTIVITAMIN WITH MINERALS) TABS tablet Take 1 tablet by mouth daily.    . potassium chloride (KLOR-CON) 10 MEQ tablet Take 10 mEq by mouth daily.    Marland Kitchen Propylene Glycol (SYSTANE COMPLETE) 0.6 % SOLN Place 1 drop into both eyes 2 (two) times daily as needed (for dryness).    . torsemide (DEMADEX) 20 MG  tablet Take 20 mg by mouth daily.    Alveda Reasons 15 MG TABS tablet TAKE 1 TABLET(15 MG) BY MOUTH DAILY WITH SUPPER 30 tablet 5   No current facility-administered medications for this visit.    Allergies:   Ace inhibitors, Amlodipine, Celebrex [celecoxib], Codeine, Rofecoxib, Ether for anesthesia [ether], and Norvasc [amlodipine besylate]   Social History: Social History        Socioeconomic History  . Marital status: Married    Spouse name: Not on file  . Number of children: Not on file  . Years of education: Not on file  . Highest education level: Not on file  Occupational History  . Not on file  Tobacco Use  . Smoking status: Never Smoker  . Smokeless tobacco: Never Used  Vaping Use  . Vaping Use: Never  used  Substance and Sexual Activity  . Alcohol use: No  . Drug use: No  . Sexual activity: Not Currently  Other Topics Concern  . Not on file  Social History Narrative  . Not on file   Social Determinants of Health      Financial Resource Strain: Not on file  Food Insecurity: No Food Insecurity  . Worried About Charity fundraiser in the Last Year: Never true  . Ran Out of Food in the Last Year: Never true  Transportation Needs: No Transportation Needs  . Lack of Transportation (Medical): No  . Lack of Transportation (Non-Medical): No  Physical Activity: Not on file  Stress: Not on file  Social Connections: Not on file  Intimate Partner Violence: Not on file    Family History:      Family History  Problem Relation Age of Onset  . Diabetes Mother   . Heart disease Father   . Hypertension Sister   . Hypertension Brother      Review of Systems: All other systems reviewed and are otherwise negative except as noted above.  Physical Exam:    Vitals:   11/30/20 1052  BP: 110/60  Pulse: 60  Weight: 127 lb (57.6 kg)  Height: 5\' 2"  (1.575 m)     GEN- The patient is well appearing, alert and oriented x 3 today.   HEENT:  normocephalic, atraumatic; sclera clear, conjunctiva pink; hearing intact; oropharynx clear; neck supple  Lungs- Clear to ausculation bilaterally, normal work of breathing.  No wheezes, rales, rhonchi Heart- Regular rate and rhythm, no murmurs, rubs or gallops  GI- soft, non-tender, non-distended, bowel sounds present  Extremities- no clubbing or cyanosis. No edema MS- no significant deformity or atrophy Skin- warm and dry, no rash or lesion; PPM pocket well healed Psych- euthymic mood, full affect Neuro- strength and sensation are intact  PPM Interrogation- reviewed in detail today,  See PACEART report  EKG:  EKG is not ordered today.  Recent Labs: 03/02/2020: NT-Pro BNP 3,278 11/06/2020: B Natriuretic Peptide 1,049.9 11/17/2020: ALT 42 11/19/2020: Magnesium 1.7 11/20/2020: Hemoglobin 11.1; Platelets 138; TSH 2.834 11/21/2020: BUN 32; Creatinine, Ser 1.68; Potassium 3.9; Sodium 138      Wt Readings from Last 3 Encounters:  11/30/20 127 lb (57.6 kg)  11/21/20 137 lb 1.6 oz (62.2 kg)  11/01/20 150 lb (68 kg)     Other studies Reviewed: Additional studies/ records that were reviewed today include: Previous EP office notes, Previous remote checks, Most recent labwork.   Assessment and Plan:  1. Symptomatic bradycardia s/p Medtronic PPM -> BIV upgrade Normal PPM function See Pace Art report No changes today   2. Acuteon chronicsystolic CHF Echo 1/96/2229 with newly reduced EF at 25-30% (TEE 01/2019 with normal EF) EF improved within 2 months of LV lead addition, EF 55-60% 07/2020 -> down slightly to 45-50 with recent admission NYHAIII currently. She sleeps in a recliner chronically due to her hips  Volume status stable.  Continue torsemide 20 mg daily. (just changed) Continue potassium 10 meq daily (just changed 11/30/19 with K 3.4) Continue coreg6.25 mgBID Offhydralazine/ imdur with hypotension.  Avoid ACE/ARB with CKD Would avoid digoxin with CKD  3. CKD  III-IV Cr 1.99 and K 3.4 on labs yesterday.  No spiro. Avoid digoxin Would only use ACE/ARB/ARNI very cautiously. For now, substituting for imdur/hydralazine for afterload reduction.  4. Persistent AF She is on amiodarone 200 mg BID. Will add surveillance labs to her pre procedure labwork.  Remains in rate controlled AF today, likely from stress from recent admission. She has not missed any Xarelto, including with thoracentesis per [facility] review.  Historically she has tolerated AF poorly.  TSH stable 09/06/2020 Will plan [facility] next week and f/u in AF clinic   Current medicines are reviewed at length with the patient today.   The patient does not have concerns regarding her medicines.  The following changes were made today:  none  Labs/ tests ordered today include:     Orders Placed This Encounter  Procedures  . CUP PACEART INCLINIC DEVICE CHECK   Disposition:   [address] next week, f/u AF clinic after. If she continues to have AF, may need to change to rate control approach.   Jacalyn Lefevre, PA-C  11/30/2020 11:38 AM  [facility] [address] [place] [address] [phone] (office) [phone] (fax)        For DCCV; compliant with xarelto; no changes [name]

## 2020-12-06 NOTE — Telephone Encounter (Signed)
I spoke with patient's daughter and gave her information from [place], [place]  Patient will take 2 of the 10 meq tablets to use up current supply.  Daughter aware prescription sent to pharmacy will be for 20 meq tablets.

## 2020-12-06 NOTE — Anesthesia Procedure Notes (Signed)
Date/Time: 12/06/2020 12:33 PM Performed by: Trinna Post., CRNA Pre-anesthesia Checklist: Patient identified, Emergency Drugs available, Suction available, Patient being monitored and Timeout performed Patient Re-evaluated:Patient Re-evaluated prior to induction Oxygen Delivery Method: Ambu bag Preoxygenation: Pre-oxygenation with 100% oxygen Induction Type: IV induction Placement Confirmation: positive ETCO2

## 2020-12-06 NOTE — Telephone Encounter (Signed)
-----   Message from Shirley Friar, PA-C sent at 12/06/2020  3:18 PM EST ----- Please increase her K to 20 meq daily (she is written as taking 10 meq daily).   She will need repeat BMET at her AF clinic f/u. (I will add to note)  Legrand Como "Oda Kilts, PA-C  12/06/2020 3:17 PM

## 2020-12-06 NOTE — Anesthesia Postprocedure Evaluation (Signed)
Anesthesia Post Note  Patient: [name]  Procedure(s) Performed: CARDIOVERSION (N/A )     Patient location during evaluation: Endoscopy Anesthesia Type: General Level of consciousness: awake and alert Pain management: pain level controlled Vital Signs Assessment: post-procedure vital signs reviewed and stable Respiratory status: spontaneous breathing, nonlabored ventilation and respiratory function stable Cardiovascular status: blood pressure returned to baseline and stable Postop Assessment: no apparent nausea or vomiting Anesthetic complications: no   No complications documented.  Last Vitals:  Vitals:   12/06/20 1255 12/06/20 1305  BP: (!) 94/52 113/60  Pulse: 70 70  Resp: 16 20  Temp:    SpO2: 93% 98%    Last Pain:  Vitals:   12/06/20 1305  TempSrc:   PainSc: 0-No pain                 Cyani Kallstrom,W. EDMOND

## 2020-12-06 NOTE — Anesthesia Preprocedure Evaluation (Addendum)
Anesthesia Evaluation  Patient identified by MRN, date of birth, ID band Patient awake    Reviewed: Allergy & Precautions, H&P , NPO status , Patient's Chart, lab work & pertinent test results  Airway Mallampati: II  TM Distance: >3 FB Neck ROM: Full    Dental no notable dental hx. (+) Teeth Intact, Dental Advisory Given   Pulmonary neg pulmonary ROS,    Pulmonary exam normal breath sounds clear to auscultation       Cardiovascular hypertension, Pt. on medications +CHF  + dysrhythmias Atrial Fibrillation  Rhythm:Regular Rate:Normal     Neuro/Psych Anxiety negative neurological ROS  negative psych ROS   GI/Hepatic Neg liver ROS, hiatal hernia, GERD  ,  Endo/Other  negative endocrine ROS  Renal/GU negative Renal ROS  negative genitourinary   Musculoskeletal  (+) Arthritis , Osteoarthritis,    Abdominal   Peds  Hematology negative hematology ROS (+)   Anesthesia Other Findings   Reproductive/Obstetrics negative OB ROS                            Anesthesia Physical Anesthesia Plan  ASA: III  Anesthesia Plan: General   Post-op Pain Management:    Induction: Intravenous  PONV Risk Score and Plan: 3 and Treatment may vary due to age or medical condition  Airway Management Planned: Mask  Additional Equipment:   Intra-op Plan:   Post-operative Plan:   Informed Consent: I have reviewed the patients History and Physical, chart, labs and discussed the procedure including the risks, benefits and alternatives for the proposed anesthesia with the patient or authorized representative who has indicated his/her understanding and acceptance.     Dental advisory given  Plan Discussed with: CRNA  Anesthesia Plan Comments:         Anesthesia Quick Evaluation

## 2020-12-06 NOTE — Procedures (Signed)
Electrical Cardioversion Procedure Note [name] [id number] 09-Dec-1929  Procedure: Electrical Cardioversion Indications:  Atrial Fibrillation  Procedure Details Consent: Risks of procedure as well as the alternatives and risks of each were explained to the (patient/caregiver).  Consent for procedure obtained. Time Out: Verified patient identification, verified procedure, site/side was marked, verified correct patient position, special equipment/implants available, medications/allergies/relevent history reviewed, required imaging and test results available.  Performed  Patient placed on cardiac monitor, pulse oximetry, supplemental oxygen as necessary.  Sedation given: Pt sedated by anesthesia with lidocaine 60 mg and diprovan 50 mg IV. Pacer pads placed anterior and posterior chest.  Cardioverted 1 time(s).  Cardioverted at [place].  Evaluation Findings: Post procedure EKG shows: Ventricular pacing; device interrogated and sinus rhythm confirmed. Complications: None Patient did tolerate procedure well.   [name] 12/06/2020, 12:13 PM

## 2020-12-06 NOTE — Transfer of Care (Signed)
Immediate Anesthesia Transfer of Care Note  Patient: [name]  Procedure(s) Performed: CARDIOVERSION (N/A )  Patient Location: PACU and Endoscopy Unit  Anesthesia Type:General  Level of Consciousness: drowsy  Airway & Oxygen Therapy: Patient Spontanous Breathing  Post-op Assessment: Report given to RN and Post -op Vital signs reviewed and stable  Post vital signs: Reviewed and stable  Last Vitals:  Vitals Value Taken Time  BP    Temp    Pulse    Resp    SpO2      Last Pain:  Vitals:   12/06/20 1211  TempSrc: Temporal  PainSc: 0-No pain         Complications: No complications documented.

## 2020-12-07 ENCOUNTER — Encounter (HOSPITAL_COMMUNITY): Payer: Self-pay | Admitting: Cardiology

## 2020-12-10 NOTE — Progress Notes (Signed)
Remote pacemaker transmission.   

## 2020-12-11 ENCOUNTER — Encounter: Payer: Medicare Other | Admitting: Student

## 2020-12-17 ENCOUNTER — Other Ambulatory Visit: Payer: Self-pay

## 2020-12-17 ENCOUNTER — Ambulatory Visit (HOSPITAL_COMMUNITY)
Admission: RE | Admit: 2020-12-17 | Discharge: 2020-12-17 | Disposition: A | Discharge status: Home / Self Care | Payer: Medicare Other | Source: Ambulatory Visit | Attending: Physician Assistant | Admitting: Physician Assistant

## 2020-12-17 VITALS — BP 146/90 | HR 70 | Ht 64.0 in | Wt 129.8 lb

## 2020-12-17 DIAGNOSIS — I130 Hypertensive heart and chronic kidney disease with heart failure and stage 1 through stage 4 chronic kidney disease, or unspecified chronic kidney disease: Secondary | ICD-10-CM | POA: Diagnosis not present

## 2020-12-17 DIAGNOSIS — D649 Anemia, unspecified: Secondary | ICD-10-CM | POA: Insufficient documentation

## 2020-12-17 DIAGNOSIS — Z79899 Other long term (current) drug therapy: Secondary | ICD-10-CM | POA: Insufficient documentation

## 2020-12-17 DIAGNOSIS — I38 Endocarditis, valve unspecified: Secondary | ICD-10-CM | POA: Insufficient documentation

## 2020-12-17 DIAGNOSIS — N183 Chronic kidney disease, stage 3 unspecified: Secondary | ICD-10-CM | POA: Insufficient documentation

## 2020-12-17 DIAGNOSIS — I252 Old myocardial infarction: Secondary | ICD-10-CM | POA: Insufficient documentation

## 2020-12-17 DIAGNOSIS — R001 Bradycardia, unspecified: Secondary | ICD-10-CM | POA: Insufficient documentation

## 2020-12-17 DIAGNOSIS — D6869 Other thrombophilia: Secondary | ICD-10-CM | POA: Diagnosis not present

## 2020-12-17 DIAGNOSIS — I5022 Chronic systolic (congestive) heart failure: Secondary | ICD-10-CM | POA: Diagnosis not present

## 2020-12-17 DIAGNOSIS — I4819 Other persistent atrial fibrillation: Secondary | ICD-10-CM | POA: Insufficient documentation

## 2020-12-17 LAB — BASIC METABOLIC PANEL
Anion gap: 15 (ref 5–15)
BUN: 38 mg/dL — ABNORMAL HIGH (ref 8–23)
CO2: 25 mmol/L (ref 22–32)
Calcium: 10 mg/dL (ref 8.9–10.3)
Chloride: 99 mmol/L (ref 98–111)
Creatinine, Ser: 2.33 mg/dL — ABNORMAL HIGH (ref 0.44–1.00)
GFR, Estimated: 20 mL/min — ABNORMAL LOW (ref >60)
Glucose, Bld: 90 mg/dL (ref 70–99)
Potassium: 4.3 mmol/L (ref 3.5–5.1)
Sodium: 139 mmol/L (ref 135–145)

## 2020-12-17 MED ORDER — AMIODARONE HCL 200 MG PO TABS: 200.0000 mg | ORAL_TABLET | Freq: Every day | ORAL | 2 refills | Status: DC

## 2020-12-17 NOTE — Progress Notes (Signed)
Primary Care Physician: Chesley Noon, MD Primary Electrophysiologist: Dr Caryl Comes Referring Physician: Dr Denna Haggard is a 87 y.o. female with a history of NICM, BiV ICD, atrial fibrillation, high grade AV block with PPM, hyperthyroidism, anemia, CKD 3-4 who presents for follow up in the [facility]. Patient is on Xarelto for a CHADS2VASC score of 5. She had a drop in her EF in 02/2020 felt possibly pacemaker induced therefore PPM upgraded to CRT-P in 05/2020 with subsequent recovery in LV function 07/2020. She presented to the ED 11/06/20 with worsening edema, SOB, DOE, orthopnea. 2D echo 11/07/20 showed EF 50-55%, moderate MR with significant prolapse and echo 11/16/19 showed EF 45-50%. She was diuresed and her amiodarone was increased. She underwent DCCV on 12/06/20. Unfortunately, she is back in a V paced rhythm with underlying afib today. However, she feels well with no increased SOB or fatigue. She reports she is able to do more and more with physical therapy. She denies any bleeding issues on anticoagulation.   Today, she denies symptoms of palpitations, chest pain, shortness of breath, orthopnea, PND, lower extremity edema, dizziness, presyncope, syncope, snoring, daytime somnolence, bleeding, or neurologic sequela. The patient is tolerating medications without difficulties and is otherwise without complaint today.    Atrial Fibrillation Risk Factors:  she does not have symptoms or diagnosis of sleep apnea. she does not have a history of rheumatic fever.   she has a BMI of Body mass index is 22.28 kg/m.Marland Kitchen Filed Weights   12/17/20 1335  Weight: 58.9 kg    Family History  Problem Relation Age of Onset  . Diabetes Mother   . Heart disease Father   . Hypertension Sister   . Hypertension Brother      Atrial Fibrillation Management history:  Previous antiarrhythmic drugs: amiodarone  Previous cardioversions: 2017, 01/2019, 12/05/19, 05/07/20,  12/06/20 Previous ablations: none CHADS2VASC score: 5 Anticoagulation history: Xarelto    Past Medical History:  Diagnosis Date  . Anxiety   . Aortic valve sclerosis   . Atrial fibrillation ([place])   . Basal cell carcinoma    hx; near eyes  . Chronic systolic CHF (congestive heart failure) (HCC)    Echocardiogram 03/2020: EF 25-30, mid-dist lat, dist ant, mid-dist inf, mid-dist septal and apical severe HK/AK, Gr 2 DD, mild reduced RVSF, RVSP 41.8, severe LAE, mod RAE, mild to mod MR, mild to mod TR, AoV sclerosis (no AS)  . GERD (gastroesophageal reflux disease)   . H/O one miscarriage   . Hard of hearing    has hearing aides but does not wear   . History of hiatal hernia   . History of kidney stones   . Hyperlipidemia   . Hypertension   . Mitral valve regurgitation 05/2020   Moderate  . OA (osteoarthritis)    "right pointer; right hip" (01/05/2017)  . Obesity   . Renal insufficiency   . SSS (sick sinus syndrome) ([place]) 01/05/2017   a. s/p MDT dual chamber PPM   . Tinnitus   . Tricuspid valve regurgitation 05/2020   Mod  . Varicose veins   . Wears glasses    Past Surgical History:  Procedure Laterality Date  . BIV UPGRADE N/A 05/30/2020   Procedure: BIV PPM UPGRADE;  Surgeon: Deboraha Sprang, MD;  Location: [place] [facility];  Service: Cardiovascular;  Laterality: N/A;  . BREAST CYST EXCISION Right 1977   "benign"  . CARDIOVERSION N/A 07/25/2016   Procedure:  CARDIOVERSION;  Surgeon: Lelon Perla, MD;  Location: [facility] ENDOSCOPY;  Service: Cardiovascular;  Laterality: N/A;  . CARDIOVERSION N/A 01/20/2019   Procedure: CARDIOVERSION;  Surgeon: Buford Dresser, MD;  Location: [facility] ENDOSCOPY;  Service: Cardiovascular;  Laterality: N/A;  . CARDIOVERSION N/A 12/05/2019   Procedure: CARDIOVERSION;  Surgeon: Dorothy Spark, MD;  Location: [facility] ENDOSCOPY;  Service: Cardiovascular;  Laterality: N/A;  . CARDIOVERSION N/A 05/07/2020   Procedure: CARDIOVERSION;  Surgeon: Thayer Headings, MD;  Location: [place];  Service: Cardiovascular;  Laterality: N/A;  . CARDIOVERSION N/A 12/06/2020   Procedure: CARDIOVERSION;  Surgeon: Lelon Perla, MD;  Location: [facility] ENDOSCOPY;  Service: Cardiovascular;  Laterality: N/A;  . CATARACT EXTRACTION W/ INTRAOCULAR LENS  IMPLANT, BILATERAL Bilateral   . DILATION AND CURETTAGE OF UTERUS    . IR THORACENTESIS ASP PLEURAL SPACE W/IMG GUIDE  11/16/2020  . NASAL SEPTUM SURGERY    . OVARIAN CYST REMOVAL  2002  . PACEMAKER IMPLANT N/A 01/05/2017   Procedure: Pacemaker Implant;  Surgeon: Will Meredith Leeds, MD;  Location: [place] [facility];  Service: Cardiovascular;  Laterality: N/A;  . TEE WITHOUT CARDIOVERSION N/A 12/16/2016   Procedure: TRANSESOPHAGEAL ECHOCARDIOGRAM (TEE);  Surgeon: Larey Dresser, MD;  Location: [place];  Service: Cardiovascular;  Laterality: N/A;  . TEE WITHOUT CARDIOVERSION N/A 01/20/2019   Procedure: TRANSESOPHAGEAL ECHOCARDIOGRAM (TEE);  Surgeon: Buford Dresser, MD;  Location[facility] ENDOSCOPY;  Service: Cardiovascular;  Laterality: N/A;  . TONSILLECTOMY    . TOTAL HIP ARTHROPLASTY Left 12/18/2015   Procedure: LEFT TOTAL HIP ARTHROPLASTY ANTERIOR APPROACH;  Surgeon: Paralee Cancel, MD;  Location: [facility];  Service: Orthopedics;  Laterality: Left;    Current Outpatient Medications  Medication Sig Dispense Refill  . acetaminophen (TYLENOL) 500 MG tablet Take 500-1,000 mg by mouth every 8 (eight) hours as needed for mild pain.    Marland Kitchen ALPRAZolam (XANAX) 0.5 MG tablet Take 0.5 mg by mouth at bedtime.    Marland Kitchen amiodarone (PACERONE) 200 MG tablet Take 1 tablet (200 mg total) by mouth 2 (two) times daily. 60 tablet 0  . busPIRone (BUSPAR) 5 MG tablet Take 5 mg by mouth 2 (two) times daily.    . Cyanocobalamin (B-12) 2500 MCG TABS Take 2,500 mcg by mouth every other day.     . Magnesium 400 MG CAPS Take 400 mg by mouth 3 (three) times a week.     . Multiple Vitamin (MULTIVITAMIN WITH MINERALS) TABS tablet Take 1 tablet  by mouth daily.    . potassium chloride SA (KLOR-CON) 20 MEQ tablet Take 1 tablet (20 mEq total) by mouth daily. 30 tablet 6  . Propylene Glycol (SYSTANE COMPLETE) 0.6 % SOLN Place 1 drop into both eyes 2 (two) times daily as needed (for dryness).    . torsemide (DEMADEX) 10 MG tablet Take 1 tablet (10 mg total) by mouth daily. 90 tablet 3  . XARELTO 15 MG TABS tablet TAKE 1 TABLET(15 MG) BY MOUTH DAILY WITH SUPPER (Patient taking differently: Take 15 mg by mouth daily with supper.) 30 tablet 5   No current facility-administered medications for this encounter.    Allergies  Allergen Reactions  . Ace Inhibitors Cough  . Amlodipine Other (See Comments) and Cough    Higher doses = VERY BAD COUGHING  . Celebrex [Celecoxib] Other (See Comments)    Reflux  . Codeine Nausea Only  . Rofecoxib Itching, Swelling and Other (See Comments)    Vioxx- legs swelling, itching  . Ether For Anesthesia [  Ether] Other (See Comments) and Cough    Burning and dry cough  . Norvasc [Amlodipine Besylate] Other (See Comments) and Cough    Higher doses = VERY BAD COUGHING     Social History   Socioeconomic History  . Marital status: Married    Spouse name: Not on file  . Number of children: Not on file  . Years of education: Not on file  . Highest education level: Not on file  Occupational History  . Not on file  Tobacco Use  . Smoking status: Never Smoker  . Smokeless tobacco: Never Used  Vaping Use  . Vaping Use: Never used  Substance and Sexual Activity  . Alcohol use: No  . Drug use: No  . Sexual activity: Not Currently  Other Topics Concern  . Not on file  Social History Narrative  . Not on file   Social Determinants of Health   Financial Resource Strain: Not on file  Food Insecurity: No Food Insecurity  . Worried About Charity fundraiser in the Last Year: Never true  . Ran Out of Food in the Last Year: Never true  Transportation Needs: No Transportation Needs  . Lack of  Transportation (Medical): No  . Lack of Transportation (Non-Medical): No  Physical Activity: Not on file  Stress: Not on file  Social Connections: Not on file  Intimate Partner Violence: Not on file     ROS- All systems are reviewed and negative except as per the HPI above.  Physical Exam: Vitals:   12/17/20 1335  Weight: 58.9 kg  Height: 5\' 4"  (1.626 m)    GEN- The patient is well appearing elderly female, alert and oriented x 3 today.   Head- normocephalic, atraumatic Eyes-  Sclera clear, conjunctiva pink Ears- hearing intact Oropharynx- clear Neck- supple  Lungs- Clear to ausculation bilaterally, normal work of breathing Heart- Regular rate and rhythm, no murmurs, rubs or gallops  GI- soft, NT, ND, + BS Extremities- no clubbing, cyanosis, or edema MS- no significant deformity or atrophy Skin- no rash or lesion Psych- euthymic mood, full affect Neuro- strength and sensation are intact  Wt Readings from Last 3 Encounters:  12/17/20 58.9 kg  12/06/20 55.8 kg  11/30/20 57.6 kg    EKG today demonstrates  V paced rhythm with underlying afib Vent. rate 70 BPM QRS duration 174 ms QT/QTc 534/576 ms  Echo 11/15/20 demonstrated  1. LVEF slightly decreased from the prior study, now 45-50% with diffuse  hypokinesis, otherwise no change from the prior study on 11/07/2020.  2. Left ventricular ejection fraction, by estimation, is 45 to 50%. The  left ventricle has mildly decreased function. The left ventricle  demonstrates global hypokinesis.  3. Right ventricular systolic function is moderately reduced. The right  ventricular size is moderately enlarged. There is mildly elevated  pulmonary artery systolic pressure. The estimated right ventricular  systolic pressure is 09.9 mmHg.  4. Left atrial size was severely dilated.  5. Right atrial size was moderately dilated.  6. A small pericardial effusion is present. The pericardial effusion is  posterior to the left  ventricle. Moderate pleural effusion in the left  lateral region.  7. Moderate mitral regurgitation however can be underestimated due to  eccentric jet. The mitral valve is degenerative. Moderate mitral valve  regurgitation. No evidence of mitral stenosis. There is moderate prolapse  of multiple segments of the anterior  leaflet of the mitral valve. Moderate mitral annular calcification.  8. Tricuspid valve regurgitation is  severe.  9. The aortic valve is normal in structure. There is moderate  calcification of the aortic valve. There is moderate thickening of the  aortic valve. Aortic valve regurgitation is trivial. Mild to moderate  aortic valve sclerosis/calcification is present,  without any evidence of aortic stenosis.  10. The inferior vena cava is dilated in size with <50% respiratory  variability, suggesting right atrial pressure of 15 mmHg.   Epic records are reviewed at length today  CHA2DS2-VASc Score = 5  The patient's score is based upon: CHF History: Yes HTN History: Yes Diabetes History: No Stroke History: No Vascular Disease History: No Age Score: 2 Gender Score: 1      ASSESSMENT AND PLAN: 1. Persistent Atrial Fibrillation (ICD10:  I48.19) The patient's CHA2DS2-VASc score is 5, indicating a 7.2% annual risk of stroke.   S/p DCCV on 12/06/20 with early return of afib.  We discussed therapeutic options today. Her AAD options are limited with her history of CHF and CKD. Rate control may be her best option at this point. Will need close follow up to make sure she stays euvolemic. Continue Xarelto 15 mg daily Decrease amiodarone to 200 mg daily.   2. Secondary Hypercoagulable State (ICD10:  D68.69) The patient is at significant risk for stroke/thromboembolism based upon her CHA2DS2-VASc Score of 5.  Continue Rivaroxaban (Xarelto).   3. Chronic systolic CHF Appears euvolemic today.  Check bmet.  4. Symptomatic Bradycardia S/p PPM, followed by Dr Caryl Comes and  the device clinic.  5. Valvular heart disease Mod MR with MVP. Not a surgical candidate per hospital notes.   Follow up with Dr Olin Pia office. AF clinic as needed.    [facility] [address] [place], [place] [address] [phone] 12/17/2020 2:00 PM

## 2020-12-17 NOTE — Patient Instructions (Signed)
Decrease Amiodarone to 200mg once a day 

## 2020-12-18 NOTE — Telephone Encounter (Signed)
-----   Message from Shirley Friar, PA-C sent at 12/18/2020  9:16 AM EST ----- Her creatinine and BUN are both somewhat elevated. As she was doing well at AF clinic visit yesterday, please hold torsemide and potassium 2 days, then resume at current doses for now.   Will plan BMET at visit next week and may need to consider Nephrology referral if Cr remains an issue.   Legrand Como [address]" [place], PA-C  12/18/2020 9:15 AM

## 2020-12-24 ENCOUNTER — Ambulatory Visit: Payer: Medicare Other | Admitting: Nurse Practitioner

## 2020-12-24 ENCOUNTER — Encounter: Payer: Self-pay | Admitting: Nurse Practitioner

## 2020-12-24 ENCOUNTER — Other Ambulatory Visit: Payer: Self-pay

## 2020-12-24 VITALS — BP 140/82 | HR 67 | Ht 64.0 in | Wt 135.8 lb

## 2020-12-24 DIAGNOSIS — N184 Chronic kidney disease, stage 4 (severe): Secondary | ICD-10-CM

## 2020-12-24 DIAGNOSIS — I4819 Other persistent atrial fibrillation: Secondary | ICD-10-CM | POA: Diagnosis not present

## 2020-12-24 DIAGNOSIS — D6869 Other thrombophilia: Secondary | ICD-10-CM

## 2020-12-24 DIAGNOSIS — I442 Atrioventricular block, complete: Secondary | ICD-10-CM

## 2020-12-24 NOTE — Progress Notes (Signed)
Electrophysiology Office Note Date: 12/24/2020  ID:  [name], DOB 1930/10/15, MRN [id number]  PCP: Chesley Noon, MD Electrophysiologist: Caryl Comes  CC: AF follow-up  [name] is a 87 y.o. female seen today for Dr Caryl Comes.  She presents today for routine electrophysiology followup.  Since last being seen in our clinic, the patient reports doing relatively well. She has had some lower extremity edema and continues working with therapy.  She denies chest pain, palpitations, dyspnea, PND, orthopnea, nausea, vomiting, dizziness, syncope,  weight gain, or early satiety.  Device History: Medtronic Dual Chamber PPM implanted 12/2016, upgraded to BIV 05/2020 for CHB/NICM  Past Medical History:  Diagnosis Date  . Anxiety   . Aortic valve sclerosis   . Atrial fibrillation ([place])   . Basal cell carcinoma    hx; near eyes  . Chronic systolic CHF (congestive heart failure) (HCC)    Echocardiogram 03/2020: EF 25-30, mid-dist lat, dist ant, mid-dist inf, mid-dist septal and apical severe HK/AK, Gr 2 DD, mild reduced RVSF, RVSP 41.8, severe LAE, mod RAE, mild to mod MR, mild to mod TR, AoV sclerosis (no AS)  . GERD (gastroesophageal reflux disease)   . H/O one miscarriage   . Hard of hearing    has hearing aides but does not wear   . History of hiatal hernia   . History of kidney stones   . Hyperlipidemia   . Hypertension   . Mitral valve regurgitation 05/2020   Moderate  . OA (osteoarthritis)    "right pointer; right hip" (01/05/2017)  . Obesity   . Renal insufficiency   . SSS (sick sinus syndrome) ([place]) 01/05/2017   a. s/p MDT dual chamber PPM   . Tinnitus   . Tricuspid valve regurgitation 05/2020   Mod  . Varicose veins   . Wears glasses    Past Surgical History:  Procedure Laterality Date  . BIV UPGRADE N/A 05/30/2020   Procedure: BIV PPM UPGRADE;  Surgeon: Deboraha Sprang, MD;  Location: [place] [facility];  Service: Cardiovascular;  Laterality: N/A;  . BREAST CYST  EXCISION Right 1977   "benign"  . CARDIOVERSION N/A 07/25/2016   Procedure: CARDIOVERSION;  Surgeon: Lelon Perla, MD;  Location: [facility] ENDOSCOPY;  Service: Cardiovascular;  Laterality: N/A;  . CARDIOVERSION N/A 01/20/2019   Procedure: CARDIOVERSION;  Surgeon: Buford Dresser, MD;  Location: [facility] ENDOSCOPY;  Service: Cardiovascular;  Laterality: N/A;  . CARDIOVERSION N/A 12/05/2019   Procedure: CARDIOVERSION;  Surgeon: Dorothy Spark, MD;  Location: [facility] ENDOSCOPY;  Service: Cardiovascular;  Laterality: N/A;  . CARDIOVERSION N/A 05/07/2020   Procedure: CARDIOVERSION;  Surgeon: Thayer Headings, MD;  Location: [place];  Service: Cardiovascular;  Laterality: N/A;  . CARDIOVERSION N/A 12/06/2020   Procedure: CARDIOVERSION;  Surgeon: Lelon Perla, MD;  Location: [facility] ENDOSCOPY;  Service: Cardiovascular;  Laterality: N/A;  . CATARACT EXTRACTION W/ INTRAOCULAR LENS  IMPLANT, BILATERAL Bilateral   . DILATION AND CURETTAGE OF UTERUS    . IR THORACENTESIS ASP PLEURAL SPACE W/IMG GUIDE  11/16/2020  . NASAL SEPTUM SURGERY    . OVARIAN CYST REMOVAL  2002  . PACEMAKER IMPLANT N/A 01/05/2017   Procedure: Pacemaker Implant;  Surgeon: Will Meredith Leeds, MD;  Location: [place] [facility];  Service: Cardiovascular;  Laterality: N/A;  . TEE WITHOUT CARDIOVERSION N/A 12/16/2016   Procedure: TRANSESOPHAGEAL ECHOCARDIOGRAM (TEE);  Surgeon: Larey Dresser, MD;  Location: [place];  Service: Cardiovascular;  Laterality: N/A;  . TEE WITHOUT  CARDIOVERSION N/A 01/20/2019   Procedure: TRANSESOPHAGEAL ECHOCARDIOGRAM (TEE);  Surgeon: Buford Dresser, MD;  Location: [facility] ENDOSCOPY;  Service: Cardiovascular;  Laterality: N/A;  . TONSILLECTOMY    . TOTAL HIP ARTHROPLASTY Left 12/18/2015   Procedure: LEFT TOTAL HIP ARTHROPLASTY ANTERIOR APPROACH;  Surgeon: Paralee Cancel, MD;  Location: [facility];  Service: Orthopedics;  Laterality: Left;    Current Outpatient Medications  Medication Sig Dispense Refill  .  acetaminophen (TYLENOL) 500 MG tablet Take 500-1,000 mg by mouth every 8 (eight) hours as needed for mild pain.    Marland Kitchen ALPRAZolam (XANAX) 0.5 MG tablet Take 0.5 mg by mouth at bedtime.    Marland Kitchen amiodarone (PACERONE) 200 MG tablet Take 1 tablet (200 mg total) by mouth daily. 30 tablet 2  . busPIRone (BUSPAR) 5 MG tablet Take 5 mg by mouth 2 (two) times daily.    . Cyanocobalamin (B-12) 2500 MCG TABS Take 2,500 mcg by mouth every other day.     . Magnesium 400 MG CAPS Take 400 mg by mouth 3 (three) times a week.     . Multiple Vitamin (MULTIVITAMIN WITH MINERALS) TABS tablet Take 1 tablet by mouth daily.    . potassium chloride SA (KLOR-CON) 20 MEQ tablet Take 1 tablet (20 mEq total) by mouth daily. 30 tablet 6  . Propylene Glycol (SYSTANE COMPLETE) 0.6 % SOLN Place 1 drop into both eyes 2 (two) times daily as needed (for dryness).    . torsemide (DEMADEX) 10 MG tablet Take 1 tablet (10 mg total) by mouth daily. 90 tablet 3  . XARELTO 15 MG TABS tablet TAKE 1 TABLET(15 MG) BY MOUTH DAILY WITH SUPPER (Patient taking differently: Take 15 mg by mouth daily with supper.) 30 tablet 5   No current facility-administered medications for this visit.    Allergies:   Ace inhibitors, Amlodipine, Celebrex [celecoxib], Codeine, Rofecoxib, Ether for anesthesia [ether], and Norvasc [amlodipine besylate]   Social History: Social History   Socioeconomic History  . Marital status: Married    Spouse name: Not on file  . Number of children: Not on file  . Years of education: Not on file  . Highest education level: Not on file  Occupational History  . Not on file  Tobacco Use  . Smoking status: Never Smoker  . Smokeless tobacco: Never Used  Vaping Use  . Vaping Use: Never used  Substance and Sexual Activity  . Alcohol use: No  . Drug use: No  . Sexual activity: Not Currently  Other Topics Concern  . Not on file  Social History Narrative  . Not on file   Social Determinants of Health   Financial  Resource Strain: Not on file  Food Insecurity: No Food Insecurity  . Worried About Charity fundraiser in the Last Year: Never true  . Ran Out of Food in the Last Year: Never true  Transportation Needs: No Transportation Needs  . Lack of Transportation (Medical): No  . Lack of Transportation (Non-Medical): No  Physical Activity: Not on file  Stress: Not on file  Social Connections: Not on file  Intimate Partner Violence: Not on file    Family History: Family History  Problem Relation Age of Onset  . Diabetes Mother   . Heart disease Father   . Hypertension Sister   . Hypertension Brother      Review of Systems: All other systems reviewed and are otherwise negative except as noted above.   Physical Exam: VS:  BP 140/82   Pulse  67   Ht 5\' 4"  (1.626 m)   Wt 135 lb 12.8 oz (61.6 kg)   SpO2 90%   BMI 23.31 kg/m  , BMI Body mass index is 23.31 kg/m.  GEN- The patient is elderly appearing, alert and oriented x 3 today.   HEENT: normocephalic, atraumatic; sclera clear, conjunctiva pink; hearing intact; oropharynx clear; neck supple  Lungs- Clear to ausculation bilaterally, normal work of breathing.  No wheezes, rales, rhonchi Heart- Regular rate and rhythm (paced) GI- soft, non-tender, non-distended, bowel sounds present  Extremities- no clubbing, cyanosis, 1+ BLE edema  MS- no significant deformity or atrophy Skin- warm and dry, no rash or lesion; PPM pocket well healed Psych- euthymic mood, full affect Neuro- strength and sensation are intact  PPM Interrogation- reviewed in detail today,  See PACEART report  EKG:  EKG is not ordered today.  Recent Labs: 03/02/2020: NT-Pro BNP 3,278 11/06/2020: B Natriuretic Peptide 1,049.9 11/19/2020: Magnesium 1.7 12/03/2020: ALT 25; Hemoglobin 11.7; Platelets 182; TSH 2.570 12/17/2020: BUN 38; Creatinine, Ser 2.33; Potassium 4.3; Sodium 139   Wt Readings from Last 3 Encounters:  12/24/20 135 lb 12.8 oz (61.6 kg)  12/17/20 129 lb  12.8 oz (58.9 kg)  12/06/20 123 lb (55.8 kg)     Other studies Reviewed: Additional studies/ records that were reviewed today include: Jonni Sanger and AF clinic notes   Assessment and Plan:  1.  Symptomatic bradycardia Normal PPM function See Pace Art report No changes today  2.  Persistent atrial fibrillation She has failed DCCV after amiodarone loading She is asymptomatic from an AF standpoint. At this time, will plan rate control strategy Amiodarone currently helping with rate control. Consider decreasing at next follow up.  Continue Xarelto for CHADS2VASC of 5  3.  Chronic systolic heart failure Stable No change required today  4.  CKD III-IV Stable No change required today Repeat BMET in 2 weeks     Current medicines are reviewed at length with the patient today.   The patient does not have concerns regarding her medicines.  The following changes were made today:  none  Labs/ tests ordered today include: none Orders Placed This Encounter  Procedures  . Basic metabolic panel     Disposition:   Follow up with Dr Caryl Comes 3 months    Signed, Chanetta Marshall, NP 12/24/2020 9:54 AM  [place] [address] [phone] (office) [phone] (fax)

## 2020-12-24 NOTE — Patient Instructions (Signed)
Medication Instructions:  Your physician recommends that you continue on your current medications as directed. Please refer to the Current Medication list given to you today.  *If you need a refill on your cardiac medications before your next appointment, please call your pharmacy*   Lab Work: BMET on 01/07/2021 - You do NOT have to be fasting for these labs  If you have labs (blood work) drawn today and your tests are completely normal, you will receive your results only by: Marland Kitchen MyChart Message (if you have MyChart) OR . A paper copy in the mail If you have any lab test that is abnormal or we need to change your treatment, we will call you to review the results.   Follow-Up: At [facility], you and your health needs are our priority.  As part of our continuing mission to provide you with exceptional heart care, we have created designated Provider Care Teams.  These Care Teams include your primary Cardiologist (physician) and Advanced Practice Providers (APPs -  Physician Assistants and Nurse Practitioners) who all work together to provide you with the care you need, when you need it.  Your next appointment:   03/27/2021  The format for your next appointment:   In Person  Provider:   Virl Axe, MD

## 2020-12-27 ENCOUNTER — Other Ambulatory Visit: Payer: Self-pay | Admitting: *Deleted

## 2020-12-27 NOTE — Patient Outreach (Signed)
[facility]) Care Management  12/27/2020  MIRIELLE BYRUM [birth year] [id number]   Telephone Assessment-Successful-HF  RN spoke witt today and received an update on her ongoing management of care. Pt explained all events and verified she is in the GREEN zone with no swelling or encountered symptoms over the last few weeks since [facility] last conversation. All updates noted in the review and discussed plan of care. Pt continue to managing her care very well with the assistance of the pt's daughter Butch Penny who continues to fill her medications box. Pt also states she remains involved with [facility] for PT in the home. Pt continue to progress with all the tools provided in managing her ongiong heart failure.   Will follow up next month with pt's ongoing management of care. No other issues or needs at this time. Plan of care discussed with notations accordingly for pt to adhere to over the next few months.   Goals Addressed            This Visit's Progress   . THN-Learn More About My Health   On track    Follow Up Date  01/23/2021 Timeframe:  Long-Range Goal Priority:  Medium Start Date:    11/26/2020                         Expected End Date:    03/08/2021                       - make a list of questions - ask questions - repeat what I heard to make sure I understand - bring a list of my medicines to the visit - speak up when I don't understand    Why is this important?    The best way to learn about your health and care is by talking to the doctor and nurse.   They will answer your questions and give you information in the way that you like best.    Notes:  Jan-Discussed on medical conditions and how caregiver is assisting with managing those condition. Educated accordingly on any condition of topic review in pt's history.     . THN-Track and Manage Fluids and Swelling-Heart Failure   On track    Follow Up Date 01/23/2021 Timeframe:  Short-term Priority:  High Start  Date:    11/26/2020                         Expected End Date:   02/07/2021                    - call office if I gain more than 2 pounds in one day or 5 pounds in one week - do ankle pumps when sitting - keep legs up while sitting - track weight in diary - use salt in moderation - watch for swelling in feet, ankles and legs every day - weigh myself daily    Why is this important?    It is important to check your weight daily and watch how much salt and liquids you have.   It will help you to manage your heart failure.    Notes:  2/17-Discussed HF action plan as pt denied any fluid retention with baseline weight. Will continue to reiterate on the above interventions and strongly encouraged adherence.    . THN-Track and Manage Symptoms-Heart Failure   On track  Follow up Date:  01/23/2021 Timeframe:  Short-Term Goal Priority:  High Start Date:    11/26/2020                         Expected End Date:  02/07/2021                        - begin a heart failure diary - bring diary to all appointments - develop a rescue plan - follow rescue plan if symptoms flare-up - know when to call the doctor - track symptoms and what helps feel better or worse    Why is this important?    You will be able to handle your symptoms better if you keep track of them.   Making some simple changes to your lifestyle will help.   Eating healthy is one thing you can do to take good care of yourself.    Notes:  2/17-Discussed HF action plan as pt denied any fluid retention with baseline weight. Will continue to reiterate on the above interventions and strongly encouraged adherence. Jan-Will send HF packet and calender with education material and discuss what to do if acute needs are encountered.         Raina Mina, RN Care Management Coordinator [place] [facility] [phone]

## 2021-01-03 ENCOUNTER — Telehealth: Payer: Self-pay | Admitting: *Deleted

## 2021-01-03 DIAGNOSIS — I4819 Other persistent atrial fibrillation: Secondary | ICD-10-CM

## 2021-01-03 DIAGNOSIS — N184 Chronic kidney disease, stage 4 (severe): Secondary | ICD-10-CM

## 2021-01-03 DIAGNOSIS — I5021 Acute systolic (congestive) heart failure: Secondary | ICD-10-CM

## 2021-01-03 NOTE — Telephone Encounter (Addendum)
Received a message from Candis Shine, RN that CHF clinic is not able to work in a new patient today and HH is needing orders to be faxed for patient.  Dr. Irish Lack recommends to increase Torsemide to 40 mg daily for 2 days, then decrease to 20 mg daily thereafter.  To repeat BMP on Monday and have the patient seen early next week.  ER precautions to be reviewed with patient.  Spoke to Dr. Irish Lack again and will go with above plan and have the patient follow up next week Tues 03/01 at 9:20 with Dr. Johney Frame.  Tried to call the patient with appointment information and got voicemail.  Called and gave her appointment information to [facility] who is seeing her tomorrow. Will forward to Dr. Caryl Comes for review and additional recommendations if needed.

## 2021-01-03 NOTE — Telephone Encounter (Signed)
Call from Reche Dixon, RN with [facility] regarding patient and her decompensation.  Discussed with Dr.Varanasi, DOD, and will refer her to CHF clinic.  Per [facility] avaliable appointment today at 3pm.  Cecille Rubin will call me back if patient unable to be seen.    She has exertional SOB, bilateral 3+ pitting edema to knees, weight up 6 pounds overnight ,bilateral crackles in lower lobes with diminished sounds in right.  BUN 75 and Creatine 2.62.  This is a significant change from her 12/24/20 visit.

## 2021-01-03 NOTE — Telephone Encounter (Signed)
Spoke with pt's daughter, Butch Penny, [place] who reports [facility] nurse is recommending pt go to ED d/t weight gain of greater than 5 pounds overnight, hearing crackles in her lungs and elevated creatinine.   Daughter reports pt is unable to stand or walk and breathing is labored but O2 sat is 96%.  Daughter advised RN agrees with remote health nurse that pt should be evaluated in the ED d/t progression of symptoms over the last 2 days. Pt's daughter verbalizes understanding and states she will have to call an ambulance d/t pt's current weakened state.

## 2021-01-04 NOTE — Telephone Encounter (Signed)
Noted  

## 2021-01-06 NOTE — Progress Notes (Signed)
Cardiology Office Note:    Date:  01/08/2021   ID:  [name], DOB 12-06-1929, MRN [id number]  PCP:  Chesley Noon, MD   [place]  Cardiologist:  Virl Axe, MD  Advanced Practice Provider:  No care team member to display Electrophysiologist:  Virl Axe, MD   Referring MD: Chesley Noon, MD     History of Present Illness:    [name] is a 87 y.o. 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  Patient had a drop in her EF in 02/2020 felt possibly pacemaker induced therefore PPM upgraded to CRT-P in 05/2020 with subsequent recovery in LV function 07/2020. She presented to the ED 11/06/20 with worsening edema, SOB, DOE, orthopnea. 2D echo 11/07/20 showed EF 50-55%, moderate MR with significant prolapse and echo 11/16/19 showed EF 45-50%. She was diuresed and her amiodarone was increased. She underwent DCCV on 12/06/20, but was back in Afib when seen by Clint Fenton on 12/17/20. Fortunately, she was doing well without symptoms at that time.  On 01/03/21, home health called as patient looked grossly volume overload. Dr. Irish Lack recommended increasing torsemide to 40mg  daily x2 days and then 20mg  daily. She now presents to clinic today.   Today, the patient states she continues to have LE edema and weight gain despite the increasing torsemide dose. The fluid started to come on last week after feeling unwell for a couple of days. Urine sample taken by remote health which was negative for UTI, CXR showed volume overload.  Also cooked a couple of meals for herself which we cannot confirm were low sodium. Has not missed doses of medications. Has been sleeping in a recliner due to orthopnea. [facility] did 80mg  IV BID for  Friday-Sunday and then she took torsemide 80mg  yesterday and 40mg  this morning. Overall went from 123lbs (from discharge in Jan)-->142lbs.   Past Medical History:  Diagnosis Date  . Anxiety   . Aortic valve sclerosis   . Atrial fibrillation ([place])   . Basal cell carcinoma    hx; near eyes  . Chronic systolic CHF (congestive heart failure) (HCC)    Echocardiogram 03/2020: EF 25-30, mid-dist lat, dist ant, mid-dist inf, mid-dist septal and apical severe HK/AK, Gr 2 DD, mild reduced RVSF, RVSP 41.8, severe LAE, mod RAE, mild to mod MR, mild to mod TR, AoV sclerosis (no AS)  . GERD (gastroesophageal reflux disease)   . H/O one miscarriage   . Hard of hearing    has hearing aides but does not wear   . History of hiatal hernia   . History of kidney stones   . Hyperlipidemia   . Hypertension   . Mitral valve regurgitation 05/2020   Moderate  . OA (osteoarthritis)    "right pointer; right hip" (01/05/2017)  . Obesity   . Renal insufficiency   . SSS (sick sinus syndrome) ([place]) 01/05/2017   a. s/p MDT dual chamber PPM   . Tinnitus   . Tricuspid valve regurgitation 05/2020   Mod  . Varicose veins   . Wears glasses     Past Surgical History:  Procedure Laterality Date  . BIV UPGRADE N/A 05/30/2020   Procedure: BIV PPM UPGRADE;  Surgeon: Deboraha Sprang, MD;  Location: [place]  CV LAB;  Service: Cardiovascular;  Laterality: N/A;  . BREAST CYST EXCISION Right 1977   "benign"  . CARDIOVERSION N/A 07/25/2016   Procedure: CARDIOVERSION;  Surgeon: Lelon Perla, MD;  Location: [facility] ENDOSCOPY;  Service: Cardiovascular;  Laterality: N/A;  . CARDIOVERSION N/A 01/20/2019   Procedure: CARDIOVERSION;  Surgeon: Buford Dresser, MD;  Location: [facility] ENDOSCOPY;  Service: Cardiovascular;  Laterality: N/A;  . CARDIOVERSION N/A 12/05/2019   Procedure: CARDIOVERSION;  Surgeon: Dorothy Spark, MD;  Location: [facility] ENDOSCOPY;  Service: Cardiovascular;  Laterality: N/A;  . CARDIOVERSION N/A 05/07/2020    Procedure: CARDIOVERSION;  Surgeon: Thayer Headings, MD;  Location: [place];  Service: Cardiovascular;  Laterality: N/A;  . CARDIOVERSION N/A 12/06/2020   Procedure: CARDIOVERSION;  Surgeon: Lelon Perla, MD;  Location: [facility] ENDOSCOPY;  Service: Cardiovascular;  Laterality: N/A;  . CATARACT EXTRACTION W/ INTRAOCULAR LENS  IMPLANT, BILATERAL Bilateral   . DILATION AND CURETTAGE OF UTERUS    . IR THORACENTESIS ASP PLEURAL SPACE W/IMG GUIDE  11/16/2020  . NASAL SEPTUM SURGERY    . OVARIAN CYST REMOVAL  2002  . PACEMAKER IMPLANT N/A 01/05/2017   Procedure: Pacemaker Implant;  Surgeon: Will Meredith Leeds, MD;  Location: [place] CV LAB;  Service: Cardiovascular;  Laterality: N/A;  . TEE WITHOUT CARDIOVERSION N/A 12/16/2016   Procedure: TRANSESOPHAGEAL ECHOCARDIOGRAM (TEE);  Surgeon: Larey Dresser, MD;  Location: [place];  Service: Cardiovascular;  Laterality: N/A;  . TEE WITHOUT CARDIOVERSION N/A 01/20/2019   Procedure: TRANSESOPHAGEAL ECHOCARDIOGRAM (TEE);  Surgeon: Buford Dresser, MD;  Location: [facility] ENDOSCOPY;  Service: Cardiovascular;  Laterality: N/A;  . TONSILLECTOMY    . TOTAL HIP ARTHROPLASTY Left 12/18/2015   Procedure: LEFT TOTAL HIP ARTHROPLASTY ANTERIOR APPROACH;  Surgeon: Paralee Cancel, MD;  Location: [facility] ORS;  Service: Orthopedics;  Laterality: Left;    Current Medications: Current Meds  Medication Sig  . acetaminophen (TYLENOL) 500 MG tablet Take 500-1,000 mg by mouth every 8 (eight) hours as needed for mild pain.  Marland Kitchen ALPRAZolam (XANAX) 0.5 MG tablet Take 0.5 mg by mouth at bedtime.  Marland Kitchen amiodarone (PACERONE) 200 MG tablet Take 1 tablet (200 mg total) by mouth daily.  . busPIRone (BUSPAR) 5 MG tablet Take 5 mg by mouth 2 (two) times daily.  . Cyanocobalamin (B-12) 2500 MCG TABS Take 2,500 mcg by mouth every other day.   . Magnesium 400 MG CAPS Take 400 mg by mouth 3 (three) times a week.   . Multiple Vitamin (MULTIVITAMIN WITH MINERALS) TABS tablet Take 1 tablet  by mouth daily.  . potassium chloride SA (KLOR-CON) 20 MEQ tablet Take 1 tablet (20 mEq total) by mouth daily.  Marland Kitchen Propylene Glycol (SYSTANE COMPLETE) 0.6 % SOLN Place 1 drop into both eyes 2 (two) times daily as needed (for dryness).  . torsemide (DEMADEX) 10 MG tablet Take 1 tablet (10 mg total) by mouth daily.  Alveda Reasons 15 MG TABS tablet TAKE 1 TABLET(15 MG) BY MOUTH DAILY WITH SUPPER     Allergies:   Ace inhibitors, Amlodipine, Celebrex [celecoxib], Codeine, Rofecoxib, Ether for anesthesia [ether], and Norvasc [amlodipine besylate]   Social History   Socioeconomic History  . Marital status: Married    Spouse name: Not on file  . Number of children: Not on file  . Years of education: Not on file  . Highest education level: Not on file  Occupational History  . Not on file  Tobacco Use  . Smoking status: Never Smoker  . Smokeless tobacco: Never Used  Vaping Use  . Vaping Use: Never used  Substance and Sexual Activity  . Alcohol use: No  . Drug use: No  . Sexual activity: Not Currently  Other Topics Concern  . Not on file  Social History Narrative  . Not on file   Social Determinants of Health   Financial Resource Strain: Not on file  Food Insecurity: No Food Insecurity  . Worried About Charity fundraiser in the Last Year: Never true  . Ran Out of Food in the Last Year: Never true  Transportation Needs: No Transportation Needs  . Lack of Transportation (Medical): No  . Lack of Transportation (Non-Medical): No  Physical Activity: Not on file  Stress: Not on file  Social Connections: Not on file     Family History: The patient's family history includes Diabetes in her mother; Heart disease in her father; Hypertension in her brother and sister.  ROS:   Please see the history of present illness.    Review of Systems  Constitutional: Positive for malaise/fatigue. Negative for chills and fever.  HENT: Negative for nosebleeds.   Eyes: Negative for blurred vision and  redness.  Respiratory: Positive for shortness of breath.   Cardiovascular: Positive for orthopnea and leg swelling. Negative for chest pain, palpitations, claudication and PND.  Gastrointestinal: Negative for melena, nausea and vomiting.  Genitourinary: Negative for dysuria.  Musculoskeletal: Positive for myalgias. Negative for falls.  Neurological: Negative for dizziness and loss of consciousness.  Endo/Heme/Allergies: Negative for polydipsia.  Psychiatric/Behavioral: Negative for substance abuse.    EKGs/Labs/Other Studies Reviewed:    The following studies were reviewed today: TTE 08-Dec-2020: IMPRESSIONS  1. LVEF slightly decreased from the prior study, now 45-50% with diffuse  hypokinesis, otherwise no change from the prior study on 11/07/2020.  2. Left ventricular ejection fraction, by estimation, is 45 to 50%. The  left ventricle has mildly decreased function. The left ventricle  demonstrates global hypokinesis.  3. Right ventricular systolic function is moderately reduced. The right  ventricular size is moderately enlarged. There is mildly elevated  pulmonary artery systolic pressure. The estimated right ventricular  systolic pressure is 16.1 mmHg.  4. Left atrial size was severely dilated.  5. Right atrial size was moderately dilated.  6. A small pericardial effusion is present. The pericardial effusion is  posterior to the left ventricle. Moderate pleural effusion in the left  lateral region.  7. Moderate mitral regurgitation however can be underestimated due to  eccentric jet. The mitral valve is degenerative. Moderate mitral valve  regurgitation. No evidence of mitral stenosis. There is moderate prolapse  of multiple segments of the anterior  leaflet of the mitral valve. Moderate mitral annular calcification.  8. Tricuspid valve regurgitation is severe.  9. The aortic valve is normal in structure. There is moderate  calcification of the aortic valve. There is  moderate thickening of the  aortic valve. Aortic valve regurgitation is trivial. Mild to moderate  aortic valve sclerosis/calcification is present,  without any evidence of aortic stenosis.  10. The inferior vena cava is dilated in size with <50% respiratory  variability, suggesting right atrial pressure of 15 mmHg.   TTE 11/07/20: IMPRESSIONS    1. Left ventricular ejection fraction, by estimation, is 50 to 55%. The  left ventricle has low normal function. The left ventricle has no regional  wall motion abnormalities. There is mild left ventricular hypertrophy.  Left ventricular diastolic  parameters are indeterminate.  2. Right ventricular systolic function is moderately reduced. The  right  ventricular size is moderately enlarged. There is mildly elevated  pulmonary artery systolic pressure. The estimated right ventricular  systolic pressure is 16.1 mmHg.  3. Right atrial size was severely dilated.  4. The mitral valve is degenerative. Moderate mitral annular  calcification. No evidence of mitral stenosis. Moderate mitral valve  regurgitation. There is significant prolapse of the anterior leaflet with  posterior directed MR jet that appears moderate  but may be underestimating as eccentric jet and not well visualized on  apical views. Could consider TEE or cardiac MRI for further evaluation  5. Tricuspid valve regurgitation is moderate.  6. The aortic valve is tricuspid. Aortic valve regurgitation is not  visualized. Mild to moderate aortic valve sclerosis/calcification is  present, without any evidence of aortic stenosis.  7. The inferior vena cava is dilated in size with <50% respiratory  variability, suggesting right atrial pressure of 15 mmHg.    Myoview 04/04/20: Nuclear stress EF: 37%. Global hypokinesis with apical dyskinesis.  The left ventricular ejection fraction is moderately decreased (30-44%).  There was no ST segment deviation noted during stress. Paced  rhythm  Defect 1: There is a small defect of mild severity present in the apex location.  This is an intermediate risk study based upon reduced ejection fraction and apparent apical infarction.  Findings consistent with prior apical myocardial infarction.  TTE 03/09/20: IMPRESSIONS    1. LVEF is depressed at approximately 25 to 30% with severe hypokinesis  /akinesis of mid/distal lateral, distal anterior, mid/distal inferior,  mid/distal septal and apical walls   COmpared TEE report from 2020 and TTE report from 2018 LVEF is  depressed. The left ventricular internal cavity size was severely dilated.  Left ventricular diastolic parameters are consistent with Grade II  diastolic dysfunction (pseudonormalization).  2. Right ventricular systolic function is mildly reduced. The right  ventricular size is mildly enlarged. There is mildly elevated pulmonary  artery systolic pressure.  3. Left atrial size was severely dilated.  4. Right atrial size was moderately dilated.  5. The mitral valve is abnormal. Mild to moderate mitral valve  regurgitation.  6. TR is eccentric and directed toward the interatrial septum.. Tricuspid  valve regurgitation is mild to moderate.  7. The aortic valve is tricuspid. Aortic valve regurgitation is not  visualized. Mild to moderate aortic valve sclerosis/calcification is  present, without any evidence of aortic stenosis.  8. The inferior vena cava is dilated in size with <50% respiratory  variability, suggesting right atrial pressure of 15 mmHg.    Recent Labs: 03/02/2020: NT-Pro BNP 3,278 11/06/2020: B Natriuretic Peptide 1,049.9 11/19/2020: Magnesium 1.7 12/03/2020: ALT 25; Hemoglobin 11.7; Platelets 182; TSH 2.570 01/07/2021: BUN 60; Creatinine, Ser 2.41; Potassium 4.4; Sodium 133  Recent Lipid Panel No results found for: CHOL, TRIG, HDL, CHOLHDL, VLDL, LDLCALC, LDLDIRECT   Risk Assessment/Calculations:    CHA2DS2-VASc Score = 5  This  indicates a 7.2% annual risk of stroke. The patient's score is based upon: CHF History: Yes HTN History: Yes Diabetes History: No Stroke History: No Vascular Disease History: No Age Score: 2 Gender Score: 1    Physical Exam:    VS:  BP 96/70   Pulse 90   Ht 5\' 4"  (1.626 m)   Wt 145 lb 6.4 oz (66 kg)   SpO2 92%   BMI 24.96 kg/m     Wt Readings from Last 3 Encounters:  01/08/21 145 lb 6.4 oz (66 kg)  12/24/20 135 lb 12.8 oz (61.6 kg)  12/17/20 129 lb 12.8 oz (58.9 kg)     GEN: Elderly female, comfortable HEENT: Normal NECK: JVD to angle of the mandible CARDIAC: RR, 2/6 systolic murmur. No rubs, gallops RESPIRATORY:  Bilateral crackles ABDOMEN: Soft, non-tender, non-distended MUSCULOSKELETAL: 3+ LE edema to the thighs SKIN: Warm and dry NEUROLOGIC:  Alert and oriented x 3 PSYCHIATRIC:  Normal affect   ASSESSMENT:    1. Acute on chronic systolic heart failure ([place])   2. Medication management   3. CKD (chronic kidney disease) stage 4, GFR 15-29 ml/min (HCC)   4. Sick sinus syndrome (HCC)   5. Persistent atrial fibrillation ([place])   6. Secondary hypercoagulable state ([place])   7. Moderate mitral regurgitation   8. Severe tricuspid regurgitation    PLAN:    In order of problems listed above:  #Acute on chronic systolic heart failure: #Nonischemic Cardiomyopathy: Thought to be related to PPM induced CM now s/p upgrade to CRT-P with improvement of LVEF to 50-55%-->45-50% with moderate RV enlargement and dysfunction. Now acutely decompensated with 3+ LE edema, pulmonary edema on CXR, and orthopnea. Unclear trigger but may be related to dietary indiscretion and possible recent illness. Weight has been increasing despite receiving IV lasix at home x3 days (80mg  BID). Wt on discharge in Jan 123-->now 142lbs. Will need hospitalization for IV diuresis as failing out-patient management. -Direct admission for IV diuresis -Will have remote health administer lasix 80mg  IV BID while  awaiting bed placement -Check BMET and BNP today -Will optimize HF medications once more medically optimized -S/p CRT-P -Monitor I/Os and daily weights  #Persistent Afib: CHADs-vasc 5. S/p DCCV on 12/06/20 with early return to Afib now on rate control. -Continue xarelto -Continue amiodarone 200mg  daily -Follow-up with Afib clinic and Dr. Caryl Comes as scheduled  #SSS s/p PPM placement with BiV upgrade 05/2020 100% V-paced. -Follow-up with EP as scheduled -Interrogate device as in-patient  #Moderate MR: #Severe TR: There is moderate prolapse of multiple segments of the anterior leaflet of the mitral valve with resultant moderate, eccentric MR. There is also severe TR. -Diuresis as above  Discussed with the patient and her daughter at length about the need for hospitalization to manage fluid status as failed out-patient IV diuresis. Both amenable to the plan. Remote health contacted and willing to administer IV diuresis while awaiting bed placement.   Medication Adjustments/Labs and Tests Ordered: Current medicines are reviewed at length with the patient today.  Concerns regarding medicines are outlined above.  Orders Placed This Encounter  Procedures  . Basic metabolic panel  . Magnesium  . Pro b natriuretic peptide (BNP)   No orders of the defined types were placed in this encounter.   Patient Instructions  Medication Instructions:  Your physician recommends that you continue on your current medications as directed. Please refer to the Current Medication list given to you today.  *If you need a refill on your cardiac medications before your next appointment, please call your pharmacy*   Lab Work: BMP, MAG, BNP-  Today   If you have labs (blood work) drawn today and your tests are completely normal, you will receive your results only by: Marland Kitchen MyChart Message (if you have MyChart) OR . A paper copy in the mail If you have any lab test that is abnormal or we need to change your  treatment, we will call you to review the results.   Testing/Procedures: None ordered    Follow-Up: We will follow up with you after you are discharged from the hospital  Other Instructions None      Signed, Freada Bergeron, MD  01/08/2021 10:58 AM    [place]

## 2021-01-07 ENCOUNTER — Other Ambulatory Visit: Payer: Medicare Other | Admitting: *Deleted

## 2021-01-07 ENCOUNTER — Other Ambulatory Visit: Payer: Self-pay

## 2021-01-07 DIAGNOSIS — I4819 Other persistent atrial fibrillation: Secondary | ICD-10-CM

## 2021-01-07 DIAGNOSIS — I442 Atrioventricular block, complete: Secondary | ICD-10-CM

## 2021-01-07 DIAGNOSIS — D6869 Other thrombophilia: Secondary | ICD-10-CM

## 2021-01-07 DIAGNOSIS — N184 Chronic kidney disease, stage 4 (severe): Secondary | ICD-10-CM

## 2021-01-08 ENCOUNTER — Other Ambulatory Visit: Payer: Self-pay

## 2021-01-08 ENCOUNTER — Encounter: Payer: Self-pay | Admitting: Cardiology

## 2021-01-08 ENCOUNTER — Inpatient Hospital Stay (HOSPITAL_COMMUNITY)
Admission: AD | Admit: 2021-01-08 | Discharge: 2021-01-12 | DRG: 291 | Disposition: A | Discharge status: Home Health | Payer: Medicare Other | Source: Ambulatory Visit | Attending: Cardiology | Admitting: Cardiology

## 2021-01-08 ENCOUNTER — Encounter (HOSPITAL_COMMUNITY): Payer: Self-pay | Admitting: Cardiology

## 2021-01-08 ENCOUNTER — Ambulatory Visit: Payer: Medicare Other | Admitting: Cardiology

## 2021-01-08 VITALS — BP 96/70 | HR 90 | Ht 64.0 in | Wt 145.4 lb

## 2021-01-08 DIAGNOSIS — Z8249 Family history of ischemic heart disease and other diseases of the circulatory system: Secondary | ICD-10-CM

## 2021-01-08 DIAGNOSIS — Z85828 Personal history of other malignant neoplasm of skin: Secondary | ICD-10-CM

## 2021-01-08 DIAGNOSIS — E785 Hyperlipidemia, unspecified: Secondary | ICD-10-CM | POA: Diagnosis present

## 2021-01-08 DIAGNOSIS — Z79899 Other long term (current) drug therapy: Secondary | ICD-10-CM

## 2021-01-08 DIAGNOSIS — I10 Essential (primary) hypertension: Secondary | ICD-10-CM | POA: Diagnosis present

## 2021-01-08 DIAGNOSIS — I130 Hypertensive heart and chronic kidney disease with heart failure and stage 1 through stage 4 chronic kidney disease, or unspecified chronic kidney disease: Secondary | ICD-10-CM | POA: Diagnosis present

## 2021-01-08 DIAGNOSIS — I5043 Acute on chronic combined systolic (congestive) and diastolic (congestive) heart failure: Secondary | ICD-10-CM | POA: Diagnosis present

## 2021-01-08 DIAGNOSIS — Z66 Do not resuscitate: Secondary | ICD-10-CM | POA: Diagnosis not present

## 2021-01-08 DIAGNOSIS — Z884 Allergy status to anesthetic agent status: Secondary | ICD-10-CM

## 2021-01-08 DIAGNOSIS — K219 Gastro-esophageal reflux disease without esophagitis: Secondary | ICD-10-CM | POA: Diagnosis present

## 2021-01-08 DIAGNOSIS — I341 Nonrheumatic mitral (valve) prolapse: Secondary | ICD-10-CM | POA: Diagnosis present

## 2021-01-08 DIAGNOSIS — Z973 Presence of spectacles and contact lenses: Secondary | ICD-10-CM

## 2021-01-08 DIAGNOSIS — N179 Acute kidney failure, unspecified: Secondary | ICD-10-CM

## 2021-01-08 DIAGNOSIS — I4819 Other persistent atrial fibrillation: Secondary | ICD-10-CM

## 2021-01-08 DIAGNOSIS — Z950 Presence of cardiac pacemaker: Secondary | ICD-10-CM

## 2021-01-08 DIAGNOSIS — D631 Anemia in chronic kidney disease: Secondary | ICD-10-CM | POA: Diagnosis present

## 2021-01-08 DIAGNOSIS — I340 Nonrheumatic mitral (valve) insufficiency: Secondary | ICD-10-CM

## 2021-01-08 DIAGNOSIS — I495 Sick sinus syndrome: Secondary | ICD-10-CM | POA: Diagnosis present

## 2021-01-08 DIAGNOSIS — E871 Hypo-osmolality and hyponatremia: Secondary | ICD-10-CM | POA: Diagnosis present

## 2021-01-08 DIAGNOSIS — F419 Anxiety disorder, unspecified: Secondary | ICD-10-CM | POA: Diagnosis present

## 2021-01-08 DIAGNOSIS — M1990 Unspecified osteoarthritis, unspecified site: Secondary | ICD-10-CM | POA: Diagnosis present

## 2021-01-08 DIAGNOSIS — I428 Other cardiomyopathies: Secondary | ICD-10-CM | POA: Diagnosis present

## 2021-01-08 DIAGNOSIS — E669 Obesity, unspecified: Secondary | ICD-10-CM | POA: Diagnosis present

## 2021-01-08 DIAGNOSIS — I5023 Acute on chronic systolic (congestive) heart failure: Secondary | ICD-10-CM | POA: Diagnosis not present

## 2021-01-08 DIAGNOSIS — Z515 Encounter for palliative care: Secondary | ICD-10-CM

## 2021-01-08 DIAGNOSIS — I361 Nonrheumatic tricuspid (valve) insufficiency: Secondary | ICD-10-CM | POA: Diagnosis not present

## 2021-01-08 DIAGNOSIS — I071 Rheumatic tricuspid insufficiency: Secondary | ICD-10-CM

## 2021-01-08 DIAGNOSIS — Z96642 Presence of left artificial hip joint: Secondary | ICD-10-CM | POA: Diagnosis present

## 2021-01-08 DIAGNOSIS — Z7902 Long term (current) use of antithrombotics/antiplatelets: Secondary | ICD-10-CM

## 2021-01-08 DIAGNOSIS — H9190 Unspecified hearing loss, unspecified ear: Secondary | ICD-10-CM | POA: Diagnosis present

## 2021-01-08 DIAGNOSIS — Z833 Family history of diabetes mellitus: Secondary | ICD-10-CM

## 2021-01-08 DIAGNOSIS — Z888 Allergy status to other drugs, medicaments and biological substances status: Secondary | ICD-10-CM

## 2021-01-08 DIAGNOSIS — I2781 Cor pulmonale (chronic): Secondary | ICD-10-CM | POA: Diagnosis present

## 2021-01-08 DIAGNOSIS — I4821 Permanent atrial fibrillation: Secondary | ICD-10-CM | POA: Diagnosis present

## 2021-01-08 DIAGNOSIS — Z20822 Contact with and (suspected) exposure to covid-19: Secondary | ICD-10-CM | POA: Diagnosis present

## 2021-01-08 DIAGNOSIS — Z7189 Other specified counseling: Secondary | ICD-10-CM | POA: Diagnosis not present

## 2021-01-08 DIAGNOSIS — E0590 Thyrotoxicosis, unspecified without thyrotoxic crisis or storm: Secondary | ICD-10-CM | POA: Diagnosis present

## 2021-01-08 DIAGNOSIS — I4430 Unspecified atrioventricular block: Secondary | ICD-10-CM | POA: Diagnosis present

## 2021-01-08 DIAGNOSIS — D6869 Other thrombophilia: Secondary | ICD-10-CM

## 2021-01-08 DIAGNOSIS — N184 Chronic kidney disease, stage 4 (severe): Secondary | ICD-10-CM

## 2021-01-08 DIAGNOSIS — Z885 Allergy status to narcotic agent status: Secondary | ICD-10-CM

## 2021-01-08 DIAGNOSIS — I5033 Acute on chronic diastolic (congestive) heart failure: Secondary | ICD-10-CM | POA: Diagnosis not present

## 2021-01-08 DIAGNOSIS — I509 Heart failure, unspecified: Secondary | ICD-10-CM

## 2021-01-08 DIAGNOSIS — N1832 Chronic kidney disease, stage 3b: Secondary | ICD-10-CM | POA: Diagnosis not present

## 2021-01-08 DIAGNOSIS — Z974 Presence of external hearing-aid: Secondary | ICD-10-CM

## 2021-01-08 DIAGNOSIS — N183 Chronic kidney disease, stage 3 unspecified: Secondary | ICD-10-CM | POA: Diagnosis present

## 2021-01-08 DIAGNOSIS — I5021 Acute systolic (congestive) heart failure: Secondary | ICD-10-CM | POA: Diagnosis not present

## 2021-01-08 LAB — SARS CORONAVIRUS 2 (TAT 6-24 HRS): SARS Coronavirus 2: NEGATIVE

## 2021-01-08 LAB — BASIC METABOLIC PANEL
BUN/Creatinine Ratio: 25 (ref 12–28)
BUN: 60 mg/dL — ABNORMAL HIGH (ref 8–27)
CO2: 22 mmol/L (ref 20–29)
Calcium: 10 mg/dL (ref 8.7–10.3)
Chloride: 93 mmol/L — ABNORMAL LOW (ref 96–106)
Creatinine, Ser: 2.41 mg/dL — ABNORMAL HIGH (ref 0.57–1.00)
Glucose: 157 mg/dL — ABNORMAL HIGH (ref 65–99)
Potassium: 4.4 mmol/L (ref 3.5–5.2)
Sodium: 133 mmol/L — ABNORMAL LOW (ref 134–144)
eGFR: 19 mL/min/{1.73_m2} — ABNORMAL LOW (ref >59)

## 2021-01-08 MED ORDER — ACETAMINOPHEN 325 MG PO TABS
650.0000 mg | ORAL_TABLET | ORAL | Status: DC | PRN
  Administered 2021-01-10: 650 mg via ORAL
  Filled 2021-01-08: qty 2

## 2021-01-08 MED ORDER — FUROSEMIDE 10 MG/ML IJ SOLN: 80.0000 mg | Freq: Two times a day (BID) | INTRAMUSCULAR | Status: DC

## 2021-01-08 MED ORDER — BUSPIRONE HCL 5 MG PO TABS
5.0000 mg | ORAL_TABLET | Freq: Two times a day (BID) | ORAL | Status: DC
  Administered 2021-01-09 – 2021-01-12 (×7): 5 mg via ORAL
  Filled 2021-01-08 (×7): qty 1

## 2021-01-08 MED ORDER — RIVAROXABAN 15 MG PO TABS
15.0000 mg | ORAL_TABLET | Freq: Every day | ORAL | Status: DC
  Administered 2021-01-09 – 2021-01-10 (×2): 15 mg via ORAL
  Filled 2021-01-08 (×2): qty 1

## 2021-01-08 MED ORDER — POTASSIUM CHLORIDE CRYS ER 20 MEQ PO TBCR
20.0000 meq | EXTENDED_RELEASE_TABLET | Freq: Every day | ORAL | Status: DC
  Administered 2021-01-09 – 2021-01-10 (×2): 20 meq via ORAL
  Filled 2021-01-08 (×2): qty 1

## 2021-01-08 MED ORDER — ONDANSETRON HCL 4 MG/2ML IJ SOLN: 4.0000 mg | Freq: Four times a day (QID) | INTRAMUSCULAR | Status: DC | PRN

## 2021-01-08 MED ORDER — SODIUM CHLORIDE 0.9 % IV SOLN: 250.0000 mL | INTRAVENOUS | Status: DC | PRN

## 2021-01-08 MED ORDER — SODIUM CHLORIDE 0.9% FLUSH: 3.0000 mL | INTRAVENOUS | Status: DC | PRN

## 2021-01-08 MED ORDER — ALPRAZOLAM 0.5 MG PO TABS
0.5000 mg | ORAL_TABLET | Freq: Every day | ORAL | Status: DC
  Administered 2021-01-09 – 2021-01-11 (×3): 0.5 mg via ORAL
  Filled 2021-01-08 (×3): qty 1

## 2021-01-08 MED ORDER — AMIODARONE HCL 200 MG PO TABS: 200.0000 mg | ORAL_TABLET | Freq: Every day | ORAL | Status: DC

## 2021-01-08 MED ORDER — SODIUM CHLORIDE 0.9% FLUSH
3.0000 mL | Freq: Two times a day (BID) | INTRAVENOUS | Status: DC
  Administered 2021-01-09 – 2021-01-11 (×6): 3 mL via INTRAVENOUS

## 2021-01-08 NOTE — Patient Instructions (Signed)
Medication Instructions:  Your physician recommends that you continue on your current medications as directed. Please refer to the Current Medication list given to you today.  *If you need a refill on your cardiac medications before your next appointment, please call your pharmacy*   Lab Work: BMP, MAG, BNP-  Today   If you have labs (blood work) drawn today and your tests are completely normal, you will receive your results only by: Marland Kitchen MyChart Message (if you have MyChart) OR . A paper copy in the mail If you have any lab test that is abnormal or we need to change your treatment, we will call you to review the results.   Testing/Procedures: None ordered    Follow-Up: We will follow up with you after you are discharged from the hospital    Other Instructions None

## 2021-01-09 ENCOUNTER — Inpatient Hospital Stay (HOSPITAL_COMMUNITY): Payer: Medicare Other

## 2021-01-09 DIAGNOSIS — I4819 Other persistent atrial fibrillation: Secondary | ICD-10-CM

## 2021-01-09 DIAGNOSIS — I5023 Acute on chronic systolic (congestive) heart failure: Secondary | ICD-10-CM

## 2021-01-09 DIAGNOSIS — I5021 Acute systolic (congestive) heart failure: Secondary | ICD-10-CM

## 2021-01-09 DIAGNOSIS — I340 Nonrheumatic mitral (valve) insufficiency: Secondary | ICD-10-CM

## 2021-01-09 DIAGNOSIS — I495 Sick sinus syndrome: Secondary | ICD-10-CM

## 2021-01-09 DIAGNOSIS — N1832 Chronic kidney disease, stage 3b: Secondary | ICD-10-CM

## 2021-01-09 DIAGNOSIS — I361 Nonrheumatic tricuspid (valve) insufficiency: Secondary | ICD-10-CM

## 2021-01-09 DIAGNOSIS — I10 Essential (primary) hypertension: Secondary | ICD-10-CM

## 2021-01-09 LAB — BASIC METABOLIC PANEL
Anion gap: 10 (ref 5–15)
BUN/Creatinine Ratio: 24 (ref 12–28)
BUN: 59 mg/dL — ABNORMAL HIGH (ref 8–27)
BUN: 62 mg/dL — ABNORMAL HIGH (ref 8–23)
CO2: 20 mmol/L (ref 20–29)
CO2: 25 mmol/L (ref 22–32)
Calcium: 9.8 mg/dL (ref 8.7–10.3)
Calcium: 9.7 mg/dL (ref 8.9–10.3)
Chloride: 92 mmol/L — ABNORMAL LOW (ref 96–106)
Chloride: 96 mmol/L — ABNORMAL LOW (ref 98–111)
Creatinine, Ser: 2.48 mg/dL — ABNORMAL HIGH (ref 0.57–1.00)
Creatinine, Ser: 2.53 mg/dL — ABNORMAL HIGH (ref 0.44–1.00)
GFR, Estimated: 18 mL/min — ABNORMAL LOW (ref >60)
Glucose, Bld: 88 mg/dL (ref 70–99)
Glucose: 124 mg/dL — ABNORMAL HIGH (ref 65–99)
Potassium: 4.7 mmol/L (ref 3.5–5.2)
Potassium: 4.7 mmol/L (ref 3.5–5.1)
Sodium: 132 mmol/L — ABNORMAL LOW (ref 134–144)
Sodium: 131 mmol/L — ABNORMAL LOW (ref 135–145)
eGFR: 18 mL/min/{1.73_m2} — ABNORMAL LOW (ref >59)

## 2021-01-09 LAB — COMPREHENSIVE METABOLIC PANEL
ALT: 36 U/L (ref 0–44)
AST: 45 U/L — ABNORMAL HIGH (ref 15–41)
Albumin: 3.2 g/dL — ABNORMAL LOW (ref 3.5–5.0)
Alkaline Phosphatase: 120 U/L (ref 38–126)
Anion gap: 12 (ref 5–15)
BUN: 65 mg/dL — ABNORMAL HIGH (ref 8–23)
CO2: 24 mmol/L (ref 22–32)
Calcium: 9.8 mg/dL (ref 8.9–10.3)
Chloride: 96 mmol/L — ABNORMAL LOW (ref 98–111)
Creatinine, Ser: 2.55 mg/dL — ABNORMAL HIGH (ref 0.44–1.00)
GFR, Estimated: 18 mL/min — ABNORMAL LOW (ref >60)
Glucose, Bld: 101 mg/dL — ABNORMAL HIGH (ref 70–99)
Potassium: 4.9 mmol/L (ref 3.5–5.1)
Sodium: 132 mmol/L — ABNORMAL LOW (ref 135–145)
Total Bilirubin: 2.5 mg/dL — ABNORMAL HIGH (ref 0.3–1.2)
Total Protein: 6.6 g/dL (ref 6.5–8.1)

## 2021-01-09 LAB — ECHOCARDIOGRAM LIMITED
Area-P 1/2: 3.91 cm2
Height: 64 in
MV M vel: 5.08 m/s
MV Peak grad: 103.2 mmHg
Radius: 0.7 cm
S' Lateral: 3.5 cm
Weight: 2310.4 oz

## 2021-01-09 LAB — BRAIN NATRIURETIC PEPTIDE: B Natriuretic Peptide: 539.1 pg/mL — ABNORMAL HIGH (ref 0.0–100.0)

## 2021-01-09 LAB — TSH: TSH: 3.227 u[IU]/mL (ref 0.350–4.500)

## 2021-01-09 LAB — MAGNESIUM
Magnesium: 2.6 mg/dL — ABNORMAL HIGH (ref 1.6–2.3)
Magnesium: 2.7 mg/dL — ABNORMAL HIGH (ref 1.7–2.4)

## 2021-01-09 LAB — PRO B NATRIURETIC PEPTIDE: NT-Pro BNP: 1494 pg/mL — ABNORMAL HIGH (ref 0–738)

## 2021-01-09 MED ORDER — FUROSEMIDE 10 MG/ML IJ SOLN: 80.0000 mg | Freq: Two times a day (BID) | INTRAMUSCULAR | Status: DC

## 2021-01-09 MED ORDER — GUAIFENESIN-DM 100-10 MG/5ML PO SYRP
5.0000 mL | ORAL_SOLUTION | ORAL | Status: DC | PRN
  Administered 2021-01-09 – 2021-01-11 (×5): 5 mL via ORAL
  Filled 2021-01-09 (×5): qty 5

## 2021-01-09 MED ORDER — FUROSEMIDE 10 MG/ML IJ SOLN
80.0000 mg | Freq: Three times a day (TID) | INTRAMUSCULAR | Status: DC
  Administered 2021-01-09 – 2021-01-11 (×7): 80 mg via INTRAVENOUS
  Filled 2021-01-09 (×7): qty 8

## 2021-01-09 MED ORDER — METOPROLOL SUCCINATE ER 25 MG PO TB24
25.0000 mg | ORAL_TABLET | Freq: Every day | ORAL | Status: DC
  Administered 2021-01-09 – 2021-01-12 (×4): 25 mg via ORAL
  Filled 2021-01-09 (×4): qty 1

## 2021-01-09 NOTE — Progress Notes (Signed)
  Echocardiogram 2D Echocardiogram has been performed.  [name] 01/09/2021, 12:00 PM

## 2021-01-09 NOTE — TOC Initial Note (Addendum)
Transition of Care [facility]) - Initial/Assessment Note    Patient Details  Name: [name] MRN: [id number] Date of Birth: Apr 02, 1930  Transition of [facility]) CM/SW Contact:    Zenon Mayo, RN Phone Number: 01/09/2021, 4:39 PM  Clinical Narrative:                 She is from home with spouse, he has a 24 hr aide who also can assiste her as well.  She has a rollator that she uses at home. She states her daughter will transport her home at dc. conts to diurese, Patient is active with [facility] for HHPT, will need to confirm with [facility]. TOC team will cont to follow for dc needs.   3/3- Cory confirmed patient is active with [facility] for HHPT. Will resume. Need HHPT order. MD notified.  Expected Discharge Plan: [place] Barriers to Discharge: Continued Medical Work up   Patient Goals and CMS Choice Patient states their goals for this hospitalization and ongoing recovery are:: get better   Choice offered to / list presented to : NA  Expected Discharge Plan and Services Expected Discharge Plan: [place]   Discharge Planning Services: CM Consult Post Acute Care Choice: NA                     DME Agency: NA       HH Arranged: NA          Prior Living Arrangements/Services   Lives with:: Spouse Patient language and need for interpreter reviewed:: Yes Do you feel safe going back to the place where you live?: Yes      Need for Family Participation in Patient Care: Yes (Comment) Care giver support system in place?: Yes (comment) Current home services: DME (has rollator at home) Criminal Activity/Legal Involvement Pertinent to Current Situation/Hospitalization: No - Comment as needed  Activities of Daily Living Home Assistive Devices/Equipment: None ADL Screening (condition at time of admission) Patient's cognitive ability adequate to safely complete daily activities?: Yes Is the patient deaf or have difficulty hearing?: Yes Does  the patient have difficulty seeing, even when wearing glasses/contacts?: No Does the patient have difficulty concentrating, remembering, or making decisions?: No Patient able to express need for assistance with ADLs?: Yes Does the patient have difficulty dressing or bathing?: No Independently performs ADLs?: Yes (appropriate for developmental age) Does the patient have difficulty walking or climbing stairs?: Yes Weakness of Legs: Both Weakness of Arms/Hands: None  Permission Sought/Granted                  Emotional Assessment   Attitude/Demeanor/Rapport: Engaged Affect (typically observed): Appropriate Orientation: : Oriented to Place,Oriented to Self,Oriented to  Time,Oriented to Situation Alcohol / Substance Use: Not Applicable Psych Involvement: No (comment)  Admission diagnosis:  Acute on chronic heart failure ([place]) [I50.9] Patient Active Problem List   Diagnosis Date Noted  . Mitral regurgitation 01/09/2021  . Acute on chronic heart failure ([place]) 01/08/2021  . Secondary hypercoagulable state ([place]) 12/17/2020  . Pressure injury of skin 11/18/2020  . Dyspnea 11/08/2020  . Acute on chronic combined systolic and diastolic CHF (congestive heart failure) ([place]) 11/06/2020  . CKD (chronic kidney disease) stage 3, GFR 30-59 ml/min (HCC) 11/06/2020  . AKI (acute kidney injury) ([place]) 11/06/2020  . Orthostatic lightheadedness 08/06/2020  . Tricuspid regurgitation 05/2020  . Persistent atrial fibrillation ([place])   . Acute systolic heart failure ([place]) 03/26/2020  . Biventricular  cardiac pacemaker in situ 01/23/2020  . Acute respiratory failure with hypoxia ([place]) 01/20/2019  . Paroxysmal atrial fibrillation (HCC)   . Sick sinus syndrome ([place]) 01/05/2017  . Overweight (BMI 25.0-29.9) 12/19/2015  . S/P left THA, AA 12/18/2015  . Chronic venous insufficiency 11/16/2015  . Varicose veins of both lower extremities 11/16/2015  . Edema 05/20/2012  . Bradycardia   . Hypertension   .  Hyperlipidemia    PCP:  Chesley Noon, MD Pharmacy:   [facility] [place], [address] [facility] [id number] Phone: [phone] Fax: [phone]     Social Determinants of Health (SDOH) Interventions    Readmission Risk Interventions No flowsheet data found.

## 2021-01-09 NOTE — Progress Notes (Signed)
Cardiology Progress Note  Patient ID: [name] MRN: [id number] DOB: September 10, 1930 Date of Encounter: 01/09/2021  Primary Cardiologist: Virl Axe, MD  Subjective   Chief Complaint: Shortness of breath  HPI: Admitted for volume overload.  Appears to be related to right heart failure.  Concerns for significant MR.  Needs repeat echo.  ROS:  All other ROS reviewed and negative. Pertinent positives noted in the HPI.     Inpatient Medications  Scheduled Meds: . ALPRAZolam  0.5 mg Oral QHS  . amiodarone  200 mg Oral Daily  . busPIRone  5 mg Oral BID  . furosemide  80 mg Intravenous Q8H  . potassium chloride SA  20 mEq Oral Daily  . Rivaroxaban  15 mg Oral Q supper  . sodium chloride flush  3 mL Intravenous Q12H   Continuous Infusions: . sodium chloride     PRN Meds: sodium chloride, acetaminophen, ondansetron (ZOFRAN) IV, sodium chloride flush   Vital Signs   Vitals:   01/08/21 2258 01/09/21 0116 01/09/21 0524 01/09/21 0746  BP:  112/70 112/68 115/72  Pulse:  73 70 71  Resp:  20 20 16   Temp:  98.9 F (37.2 C) (!) 97.3 F (36.3 C) (!) 97.3 F (36.3 C)  TempSrc:   Oral Oral  SpO2: 100% 95% 96% 98%  Weight:   65.5 kg   Height:        Intake/Output Summary (Last 24 hours) at 01/09/2021 0757 Last data filed at 01/08/2021 2158 Gross per 24 hour  Intake --  Output 400 ml  Net -400 ml   Last 3 Weights 01/09/2021 01/08/2021 01/08/2021  Weight (lbs) 144 lb 6.4 oz 145 lb 4.5 oz 145 lb 6.4 oz  Weight (kg) 65.499 kg 65.9 kg 65.953 kg      Telemetry  Overnight telemetry shows V paced rhythm, which I personally reviewed.   Physical Exam   Vitals:   01/08/21 2258 01/09/21 0116 01/09/21 0524 01/09/21 0746  BP:  112/70 112/68 115/72  Pulse:  73 70 71  Resp:  20 20 16   Temp:  98.9 F (37.2 C) (!) 97.3 F (36.3 C) (!) 97.3 F (36.3 C)  TempSrc:   Oral Oral  SpO2: 100% 95% 96% 98%  Weight:   65.5 kg   Height:         Intake/Output Summary (Last 24 hours) at 01/09/2021  0757 Last data filed at 01/08/2021 2158 Gross per 24 hour  Intake --  Output 400 ml  Net -400 ml    Last 3 Weights 01/09/2021 01/08/2021 01/08/2021  Weight (lbs) 144 lb 6.4 oz 145 lb 4.5 oz 145 lb 6.4 oz  Weight (kg) 65.499 kg 65.9 kg 65.953 kg    Body mass index is 24.79 kg/m.  General: Well nourished, well developed, in no acute distress Head: Atraumatic, normal size  Eyes: PEERLA, EOMI  Neck: Supple, JVD 17-18 cmH2O Endocrine: No thryomegaly Cardiac: Normal S1, S2; 3 out of 6 holosystolic murmur Lungs: Rales bilaterally Abd: Soft, nontender, no hepatomegaly  Ext: 2+ pitting edema up to the knees Musculoskeletal: No deformities, BUE and BLE strength normal and equal Skin: Warm and dry, no rashes   Neuro: Alert and oriented to person, place, time, and situation, CNII-XII grossly intact, no focal deficits  Psych: Normal mood and affect   Labs  High Sensitivity Troponin:  No results for input(s): TROPONINIHS in the last 720 hours.   Cardiac EnzymesNo results for input(s): TROPONINI in the last 168 hours. No results  for input(s): TROPIPOC in the last 168 hours.  Chemistry Recent Labs  Lab 01/08/21 1055 01/08/21 2313 01/09/21 0422  NA 132* 132* 131*  K 4.7 4.9 4.7  CL 92* 96* 96*  CO2 20 24 25   GLUCOSE 124* 101* 88  BUN 59* 65* 62*  CREATININE 2.48* 2.55* 2.53*  CALCIUM 9.8 9.8 9.7  PROT  --  6.6  --   ALBUMIN  --  3.2*  --   AST  --  45*  --   ALT  --  36  --   ALKPHOS  --  120  --   BILITOT  --  2.5*  --   GFRNONAA  --  18* 18*  ANIONGAP  --  12 10    HematologyNo results for input(s): WBC, RBC, HGB, HCT, MCV, MCH, MCHC, RDW, PLT in the last 168 hours. BNP Recent Labs  Lab 01/08/21 1055 01/08/21 2313  BNP  --  539.1*  PROBNP 1,494*  --     DDimer No results for input(s): DDIMER in the last 168 hours.   Radiology  DG Chest Port 1 View  Result Date: 01/09/2021 CLINICAL DATA:  Chronic heart failure.  Productive cough. EXAM: PORTABLE CHEST 1 VIEW COMPARISON:   11/18/2020. FINDINGS: AICD in stable position. Stable cardiomegaly. Low lung volumes with mild bibasilar atelectasis. Stable small left pleural effusion. No pneumothorax. IMPRESSION: 1. AICD in stable position. Stable cardiomegaly. 2. Low lung volumes with mild bibasilar atelectasis. Stable small left pleural effusion. Electronically Signed   By: Marcello Moores  Register   On: 01/09/2021 05:37    Cardiac Studies  TTE 11/15/2020 1. LVEF slightly decreased from the prior study, now 45-50% with diffuse  hypokinesis, otherwise no change from the prior study on 11/07/2020.  2. Left ventricular ejection fraction, by estimation, is 45 to 50%. The  left ventricle has mildly decreased function. The left ventricle  demonstrates global hypokinesis.  3. Right ventricular systolic function is moderately reduced. The right  ventricular size is moderately enlarged. There is mildly elevated  pulmonary artery systolic pressure. The estimated right ventricular  systolic pressure is 99.3 mmHg.  4. Left atrial size was severely dilated.  5. Right atrial size was moderately dilated.  6. A small pericardial effusion is present. The pericardial effusion is  posterior to the left ventricle. Moderate pleural effusion in the left  lateral region.  7. Moderate mitral regurgitation however can be underestimated due to  eccentric jet. The mitral valve is degenerative. Moderate mitral valve  regurgitation. No evidence of mitral stenosis. There is moderate prolapse  of multiple segments of the anterior  leaflet of the mitral valve. Moderate mitral annular calcification.  8. Tricuspid valve regurgitation is severe.  9. The aortic valve is normal in structure. There is moderate  calcification of the aortic valve. There is moderate thickening of the  aortic valve. Aortic valve regurgitation is trivial. Mild to moderate  aortic valve sclerosis/calcification is present,  without any evidence of aortic stenosis.  10. The  inferior vena cava is dilated in size with <50% respiratory  variability, suggesting right atrial pressure of 15 mmHg.   Patient Profile  [name] is a 87 y.o. female with nonischemic cardiomyopathy secondary to RV pacing with upgrade to CRT-P, persistent atrial fibrillation on Xarelto, high-grade AV block status post pacemaker, hypothyroidism, CKD stage IV who was admitted on 01/08/2021 for acute decompensated heart failure.  Assessment & Plan   1.  Acute decompensated systolic heart failure, EF 45-50%/right-sided heart  failure/cor pulmonale -She has severe RV failure on my review of her most recent echocardiogram.  She also had an eccentric MR jet.  Murmurs on examination are consistent with tricuspid regurgitation she also has a loud pitched murmur consistent with mitral regurgitation.  She needs a repeat echocardiogram to determine what is changed. -Significant CKD will preclude aggressive measures.  For now we will continue with aggressive diuresis 80 mg IV twice daily. -We will need to get a good look at her mitral valve.  This could be the bigger issue here.  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 -She is chronically paced.  Her LV threshold have increased.  I do not think she is getting good BiV pacing.  We will check an EKG.  Likely have EP see her.  We will have her device interrogated.  2.  Severe tricuspid regurgitation/moderate mitral regurgitation -She has severe RV enlargement with RV failure.  She also had moderate MR.  She has murmurs of TR and MR.  Repeat echo today. -She has really limited options given her significant CKD and age of 63 years. -Repeat echo.  3. Persistent Afib s/p ppm -She appears to be persistently in atrial fibrillation.  We will stop her amiodarone.  This is not a good rate control strategy. -We will transition her to Toprol.  She has a pacemaker. -I will have EP see her regarding her device. -continue xarelto  4. AKI on CKD -CKD stage IV.  Renally  dose all medications.  Cautious with any nephrotoxic agents.  FEN -no IVF -diet: salt restricted -dvt ppx: xarelto -code: fulll  For questions or updates, please contact [place] Please consult [email or web address] for contact info under   Time Spent with Patient: I have spent a total of 25 minutes with patient reviewing hospital notes, telemetry, EKGs, labs and examining the patient as well as establishing an assessment and plan that was discussed with the patient.  > 50% of time was spent in direct patient care.    Signed, Addison Naegeli. Audie Box, [address]  01/09/2021 7:57 AM

## 2021-01-09 NOTE — H&P (Signed)
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  History of Present Illness    Patient had a drop in her EF in 02/2020 felt possibly pacemaker induced therefore PPM upgraded to CRT-P in 05/2020 with subsequent recovery in LV function 07/2020.Shepresented to the ED12/28/21 with worsening edema, SOB, DOE, orthopnea. 2D echo 11/07/20 showed EF 50-55%, moderate MR with significant prolapseand echo 11/16/19 showed EF 45-50%. She was diuresed and her amiodarone was increased and underwent DCCV  On 01/03/21, home health called as patient looked grossly volume overload. Dr. Irish Lack recommended increasing torsemide to 40mg  daily x2 days and then 20mg  daily.  She subseqeuntly presented to clinic with complaints of LE edema nd weight gain despite increasing torsemide.  She has been having worsening orthopnea and PND as well.  She had been placed on furosemide 80 mg IV BID as an outpatient but despite this weight has been increasing and has presented for admission.  Past Medical History   Past Medical History:  Diagnosis Date  . Anxiety   . Aortic valve sclerosis   . Atrial fibrillation ([place])   . Basal cell carcinoma    hx; near eyes  . Chronic systolic CHF (congestive heart failure) (HCC)    Echocardiogram 03/2020: EF 25-30, mid-dist lat, dist ant, mid-dist inf, mid-dist septal and apical severe HK/AK, Gr 2 DD, mild reduced RVSF, RVSP 41.8, severe LAE, mod RAE, mild to mod MR, mild to mod TR, AoV sclerosis (no AS)  . GERD (gastroesophageal reflux disease)   . H/O one miscarriage   . Hard of hearing     has hearing aides but does not wear   . History of hiatal hernia   . History of kidney stones   . Hyperlipidemia   . Hypertension   . Mitral valve regurgitation 05/2020   Moderate  . OA (osteoarthritis)    "right pointer; right hip" (01/05/2017)  . Obesity   . Renal insufficiency   . SSS (sick sinus syndrome) ([place]) 01/05/2017   a. s/p MDT dual chamber PPM   . Tinnitus   . Tricuspid valve regurgitation 05/2020   Mod  . Varicose veins   . Wears glasses     Past Surgical History:  Procedure Laterality Date  . BIV UPGRADE N/A 05/30/2020   Procedure: BIV PPM UPGRADE;  Surgeon: Deboraha Sprang, MD;  Location: [place] [facility];  Service: Cardiovascular;  Laterality: N/A;  . BREAST CYST EXCISION Right 1977   "benign"  . CARDIOVERSION N/A 07/25/2016   Procedure: CARDIOVERSION;  Surgeon: Lelon Perla, MD;  Location: [facility] ENDOSCOPY;  Service: Cardiovascular;  Laterality: N/A;  . CARDIOVERSION N/A 01/20/2019   Procedure: CARDIOVERSION;  Surgeon: Buford Dresser, MD;  Location: [facility] ENDOSCOPY;  Service: Cardiovascular;  Laterality: N/A;  . CARDIOVERSION N/A 12/05/2019   Procedure: CARDIOVERSION;  Surgeon: Dorothy Spark, MD;  Location: [facility] ENDOSCOPY;  Service: Cardiovascular;  Laterality: N/A;  . CARDIOVERSION N/A 05/07/2020   Procedure: CARDIOVERSION;  Surgeon: Thayer Headings, MD;  Location: [facility] ENDOSCOPY;  Service: Cardiovascular;  Laterality: N/A;  . CARDIOVERSION N/A 12/06/2020   Procedure: CARDIOVERSION;  Surgeon:  Lelon Perla, MD;  Location: [facility] ENDOSCOPY;  Service: Cardiovascular;  Laterality: N/A;  . CATARACT EXTRACTION W/ INTRAOCULAR LENS  IMPLANT, BILATERAL Bilateral   . DILATION AND CURETTAGE OF UTERUS    . IR THORACENTESIS ASP PLEURAL SPACE W/IMG GUIDE  11/16/2020  . NASAL SEPTUM SURGERY    . OVARIAN CYST REMOVAL  2002  . PACEMAKER IMPLANT N/A 01/05/2017   Procedure: Pacemaker Implant;  Surgeon: Will Meredith Leeds, MD;  Location: [place] [facility];  Service:  Cardiovascular;  Laterality: N/A;  . TEE WITHOUT CARDIOVERSION N/A 12/16/2016   Procedure: TRANSESOPHAGEAL ECHOCARDIOGRAM (TEE);  Surgeon: Larey Dresser, MD;  Location: [place];  Service: Cardiovascular;  Laterality: N/A;  . TEE WITHOUT CARDIOVERSION N/A 01/20/2019   Procedure: TRANSESOPHAGEAL ECHOCARDIOGRAM (TEE);  Surgeon: Buford Dresser, MD;  Location: [facility] ENDOSCOPY;  Service: Cardiovascular;  Laterality: N/A;  . TONSILLECTOMY    . TOTAL HIP ARTHROPLASTY Left 12/18/2015   Procedure: LEFT TOTAL HIP ARTHROPLASTY ANTERIOR APPROACH;  Surgeon: Paralee Cancel, MD;  Location: [facility]S;  Service: Orthopedics;  Laterality: Left;     Allergies Allergies  Allergen Reactions  . Ace Inhibitors Cough  . Amlodipine Other (See Comments) and Cough    Higher doses = VERY BAD COUGHING  . Celebrex [Celecoxib] Other (See Comments)    Reflux  . Codeine Nausea Only  . Rofecoxib Itching, Swelling and Other (See Comments)    Vioxx- legs swelling, itching  . Ether For Anesthesia [Ether] Other (See Comments) and Cough    Burning and dry cough  . Norvasc [Amlodipine Besylate] Other (See Comments) and Cough    Higher doses = VERY BAD COUGHING     Home Medications    Prior to Admission medications   Medication Sig Start Date End Date Taking? Authorizing Provider  ALPRAZolam Duanne Moron) 0.5 MG tablet Take 0.5 mg by mouth at bedtime. Take 0.5 mg by mouth at bedtime and an additional 0.25-0.5 mg once a day as needed for anxiety   Yes [provider]  amiodarone (PACERONE) 200 MG tablet Take 1 tablet (200 mg total) by mouth daily. 12/17/20 01/16/21 Yes Fenton, Clint R, PA  busPIRone (BUSPAR) 5 MG tablet Take 5 mg by mouth 2 (two) times daily.   Yes [provider]  Cyanocobalamin (B-12) 2500 MCG TABS Take 2,500 mcg by mouth every other day.    Yes [provider]  Magnesium 400 MG CAPS Take 400 mg by mouth 3 (three) times a week.    Yes [provider]  Multiple Vitamin  (MULTIVITAMIN WITH MINERALS) TABS tablet Take 1 tablet by mouth daily.   Yes [provider]  potassium chloride SA (KLOR-CON) 20 MEQ tablet Take 1 tablet (20 mEq total) by mouth daily. Patient taking differently: Take 20 mEq by mouth daily with supper. 12/06/20  Yes Shirley Friar, PA-C  torsemide (DEMADEX) 10 MG tablet Take 1 tablet (10 mg total) by mouth daily. Patient taking differently: Take 40 mg by mouth in the morning. Take 40 mg by mouth in the morning, unless otherwise instructed by MD 12/04/20  Yes Shirley Friar, PA-C  XARELTO 15 MG TABS tablet TAKE 1 TABLET(15 MG) BY MOUTH DAILY WITH SUPPER Patient taking differently: Take 15 mg by mouth daily with supper. 07/23/20  Yes Deboraha Sprang, MD  acetaminophen (TYLENOL) 500 MG tablet Take 500-1,000 mg by mouth every 8 (eight) hours as needed for mild pain.    [provider]  Propylene Glycol (SYSTANE COMPLETE) 0.6 % SOLN  Place 1 drop into both eyes 2 (two) times daily as needed (for dryness).    [provider]    Family History    Family History  Problem Relation Age of Onset  . Diabetes Mother   . Heart disease Father   . Hypertension Sister   . Hypertension Brother    She indicated that her mother is deceased. She indicated that her father is deceased. She indicated that her sister is alive. She indicated that her brother is alive.   Social History    Social History   Socioeconomic History  . Marital status: Married    Spouse name: Not on file  . Number of children: Not on file  . Years of education: Not on file  . Highest education level: Not on file  Occupational History  . Not on file  Tobacco Use  . Smoking status: Never Smoker  . Smokeless tobacco: Never Used  Vaping Use  . Vaping Use: Never used  Substance and Sexual Activity  . Alcohol use: No  . Drug use: No  . Sexual activity: Not Currently  Other Topics Concern  . Not on file  Social History Narrative  . Not  on file   Social Determinants of Health   Financial Resource Strain: Not on file  Food Insecurity: No Food Insecurity  . Worried About Charity fundraiser in the Last Year: Never true  . Ran Out of Food in the Last Year: Never true  Transportation Needs: No Transportation Needs  . Lack of Transportation (Medical): No  . Lack of Transportation (Non-Medical): No  Physical Activity: Not on file  Stress: Not on file  Social Connections: Not on file  Intimate Partner Violence: Not on file     Review of Systems    General:  No chills, fever, night sweats or weight changes.  Cardiovascular:  No chest pain, dyspnea on exertion, edema, orthopnea, palpitations, paroxysmal nocturnal dyspnea. Dermatological: No rash, lesions/masses Respiratory: No cough, dyspnea Urologic: No hematuria, dysuria Abdominal:   No nausea, vomiting, diarrhea, bright red blood per rectum, melena, or hematemesis Neurologic:  No visual changes, wkns, changes in mental status. All other systems reviewed and are otherwise negative except as noted above.  Physical Exam    BP 112/68 (BP Location: Left Arm)   Pulse 70   Temp (!) 97.3 F (36.3 C) (Oral)   Resp 20   Ht 5\' 4"  (1.626 m)   Wt 65.5 kg Comment: scale c  SpO2 96%   BMI 24.79 kg/m  General: Alert, NAD HEENT: Normal  Neck: JVP to the angle of the mandible at 60 degrees Lungs:  Dependent crackles bialterally Heart: Regular rhythm w. 2/6 HSM apex Abdomen: Soft, non-tender, non-distended, BS +.  Extremities: Tepid extremity with gross edema of LE. Psych: Normal affect. Neuro: Alert and oriented. No gross focal deficits. No abnormal movements.  Labs    Troponin (Point of Care Test) No results for input(s): TROPIPOC in the last 72 hours. No results for input(s): CKTOTAL, CKMB, TROPONINI in the last 72 hours. Lab Results  Component Value Date   WBC 4.6 12/03/2020   HGB 11.7 12/03/2020   HCT 36.1 12/03/2020   MCV 90 12/03/2020   PLT 182 12/03/2020     Recent Labs  Lab 01/08/21 2313 01/09/21 0422  NA 132* 131*  K 4.9 4.7  CL 96* 96*  CO2 24 25  BUN 65* 62*  CREATININE 2.55* 2.53*  CALCIUM 9.8 9.7  PROT 6.6  --  BILITOT 2.5*  --   ALKPHOS 120  --   ALT 36  --   AST 45*  --   GLUCOSE 101* 88   No results found for: CHOL, HDL, LDLCALC, TRIG No results found for: [facility]   Radiology Studies    DG Chest Port 1 View  Result Date: 01/09/2021 CLINICAL DATA:  Chronic heart failure.  Productive cough. EXAM: PORTABLE CHEST 1 VIEW COMPARISON:  11/18/2020. FINDINGS: AICD in stable position. Stable cardiomegaly. Low lung volumes with mild bibasilar atelectasis. Stable small left pleural effusion. No pneumothorax. IMPRESSION: 1. AICD in stable position. Stable cardiomegaly. 2. Low lung volumes with mild bibasilar atelectasis. Stable small left pleural effusion. Electronically Signed   By: Marcello Moores  Register   On: 01/09/2021 05:37    ECG & Cardiac Imaging    The following studies were reviewed today: TTE 2020/12/07: IMPRESSIONS  1. LVEF slightly decreased from the prior study, now 45-50% with diffuse  hypokinesis, otherwise no change from the prior study on 11/07/2020.  2. Left ventricular ejection fraction, by estimation, is 45 to 50%. The  left ventricle has mildly decreased function. The left ventricle  demonstrates global hypokinesis.  3. Right ventricular systolic function is moderately reduced. The right  ventricular size is moderately enlarged. There is mildly elevated  pulmonary artery systolic pressure. The estimated right ventricular  systolic pressure is 48.0 mmHg.  4. Left atrial size was severely dilated.  5. Right atrial size was moderately dilated.  6. A small pericardial effusion is present. The pericardial effusion is  posterior to the left ventricle. Moderate pleural effusion in the left  lateral region.  7. Moderate mitral regurgitation however can be underestimated due to  eccentric jet. The mitral valve  is degenerative. Moderate mitral valve  regurgitation. No evidence of mitral stenosis. There is moderate prolapse  of multiple segments of the anterior  leaflet of the mitral valve. Moderate mitral annular calcification.  8. Tricuspid valve regurgitation is severe.  9. The aortic valve is normal in structure. There is moderate  calcification of the aortic valve. There is moderate thickening of the  aortic valve. Aortic valve regurgitation is trivial. Mild to moderate  aortic valve sclerosis/calcification is present,  without any evidence of aortic stenosis.  10. The inferior vena cava is dilated in size with <50% respiratory  variability, suggesting right atrial pressure of 15 mmHg.   TTE 11/07/20: IMPRESSIONS    1. Left ventricular ejection fraction, by estimation, is 50 to 55%. The  left ventricle has low normal function. The left ventricle has no regional  wall motion abnormalities. There is mild left ventricular hypertrophy.  Left ventricular diastolic  parameters are indeterminate.  2. Right ventricular systolic function is moderately reduced. The right  ventricular size is moderately enlarged. There is mildly elevated  pulmonary artery systolic pressure. The estimated right ventricular  systolic pressure is 16.5 mmHg.  3. Right atrial size was severely dilated.  4. The mitral valve is degenerative. Moderate mitral annular  calcification. No evidence of mitral stenosis. Moderate mitral valve  regurgitation. There is significant prolapse of the anterior leaflet with  posterior directed MR jet that appears moderate  but may be underestimating as eccentric jet and not well visualized on  apical views. Could consider TEE or cardiac MRI for further evaluation  5. Tricuspid valve regurgitation is moderate.  6. The aortic valve is tricuspid. Aortic valve regurgitation is not  visualized. Mild to moderate aortic valve sclerosis/calcification is  present, without any evidence  of aortic stenosis.  7. The inferior vena cava is dilated in size with <50% respiratory  variability, suggesting right atrial pressure of 15 mmHg.    Myoview 04/04/20: Nuclear stress EF: 37%. Global hypokinesis with apical dyskinesis.  The left ventricular ejection fraction is moderately decreased (30-44%).  There was no ST segment deviation noted during stress. Paced rhythm  Defect 1: There is a small defect of mild severity present in the apex location.  This is an intermediate risk study based upon reduced ejection fraction and apparent apical infarction.  Findings consistent with prior apical myocardial infarction.  TTE 03/09/20: IMPRESSIONS    1. LVEF is depressed at approximately 25 to 30% with severe hypokinesis  /akinesis of mid/distal lateral, distal anterior, mid/distal inferior,  mid/distal septal and apical walls   COmpared TEE report from 2020 and TTE report from 2018 LVEF is  depressed. The left ventricular internal cavity size was severely dilated.  Left ventricular diastolic parameters are consistent with Grade II  diastolic dysfunction (pseudonormalization).  2. Right ventricular systolic function is mildly reduced. The right  ventricular size is mildly enlarged. There is mildly elevated pulmonary  artery systolic pressure.  3. Left atrial size was severely dilated.  4. Right atrial size was moderately dilated.  5. The mitral valve is abnormal. Mild to moderate mitral valve  regurgitation.  6. TR is eccentric and directed toward the interatrial septum.. Tricuspid  valve regurgitation is mild to moderate.  7. The aortic valve is tricuspid. Aortic valve regurgitation is not  visualized. Mild to moderate aortic valve sclerosis/calcification is  present, without any evidence of aortic stenosis.  8. The inferior vena cava is dilated in size with <50% respiratory  variability, suggesting right atrial pressure of 15 mmHg.    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  Plan #Acute on chronic systolic heart failure: #Nonischemic Cardiomyopathy: Thought to be related to PPM induced CM now s/p upgrade to CRT-P with improvement of LVEF to 50-55%-->45-50% with moderate RV enlargement and dysfunction. Now acutely decompensated with 3+ LE edema, pulmonary edema on CXR, and orthopnea. Unclear trigger but may be related to dietary indiscretion and possible recent illness. Weight has been increasing despite receiving IV lasix at home x3 days (80mg  BID). Wt on discharge in Jan 123-->now 142lbs. Will need hospitalization for IV diuresis as failing out-patient management. -Furosemide 80 mg IV TID; suspect she may need continuous infusion.  Augment with thaizide as needed. -Will optimize HF medications once more medically optimized -S/p CRT-P -Monitor I/Os and daily weights  #Persistent Afib: CHADs-vasc 5. S/p DCCV on 12/06/20 with early return to Afib now on rate control. -Continue xarelto -Continue amiodarone 200mg  daily -Follow-up with Afib clinic and Dr. Caryl Comes as scheduled  #SSS s/p PPM placement with BiV upgrade 05/2020 100% V-paced. -Interrogate device as in-patient  #Moderate MR: #Severe TR: There is moderate prolapse of multiple segments of the anterior leaflet of the mitral valve with resultant moderate, eccentric MR. There is also severe TR. -Diuresis as above  Nutrition: Heart healthy DVT ppx: Continue xarelto GI ppx: No indication Advanced Care Planning: Full code.  Had limited discussion but will need readressed.   Signed, Delight Hoh, MD 01/09/2021, 5:47 AM

## 2021-01-09 NOTE — Consult Note (Addendum)
Cardiology Consultation:   Patient ID: [name] MRN: [id number]; DOB: Aug 23, 1930  Admit date: 01/08/2021 Date of Consult: 01/09/2021  PCP:  Mary Noon, MD   [name]  Cardiologist:  Dr. Johney Roberson Electrophysiologist:  Mary Axe, MD    Patient Profile:   [name] is a 87 y.o. female with a hx of NICM (suspect 2/2 RV pacing) with PPM upgrade to CRT-P last year, AFib, CHB, hyperthyroidism, anemia, CKD (III-IV),  who is being seen today for the evaluation of evaluatioin for potential device optimization with recurrent CHFat the request of Dr. Marisue Roberson .  History of Present Illness:   [name] was last seen for EP service by A. Lynnell Jude, NP 12/24/20, at that time discussed failed DCCV with ERAF despite amiodarone, no symptoms felt attributable to her AF with plans for rate control strategy going forward.  Discussed amiodarone helping in her rate control with plans to consider reducing dose at her next visit.  Phone notes since then with increasing DOE and [facility] RN observations of volume OL She saw Dr. Johney Roberson yesterday for this She was orthopneic, edematous despite recent increase in diuresis, found in an acute/chronic CHF exacerbation and planned for admission for IV diuresis.  EP is asked to weigh in on any options for device optimization  Echos have evolved as noted LVEF this admission 35-40%, global hypokinesis with septal-lateral dyssynchrony, mod RVEF reduction and enlargement, RVSP 42, grade II DD 11/30/20, LVEF 45-50%, global hypokinesis, RVEF mod reduced, mod enlarged, RVSP 40.4, severe TR, RA mod diltaed 11/07/20, LVEF 50-55%, no WMA, RVEF mod reduced, mod enlarged, RVSP 43.3, RA severely dilated, mod MR with significant prolapse, TR is moderate CRT-P UPGRADE 05/30/20 04/04/20: myoview with Findings consistent with prior apical myocardial infarction, EF 37% 02/11/20 LVEF 25-30%, akinesis of mid/distal lateral, distal anterior, mid/distal  inferior,  mid/distal septal and apical walls, grade II DD, mild-mod TR  Device information Device History:  MedtronicDual ChamberPPM implanted 12/2016, upgraded to BIV 05/2020 for CHB/NICM  The patient thinks she feels a little better this afternoon then she did earlier Denies rest SOB, tells me she has slept in the recliner for years, this is not new for her and did not thinks he felt terribly SOB but did have steadily increasing swelling. No CP, palpitations No near syncope or syncope No bleeding  Past Medical History:  Diagnosis Date  . Anxiety   . Aortic valve sclerosis   . Atrial fibrillation ([place])   . Basal cell carcinoma    hx; near eyes  . Chronic systolic CHF (congestive heart failure) (HCC)    Echocardiogram 03/2020: EF 25-30, mid-dist lat, dist ant, mid-dist inf, mid-dist septal and apical severe HK/AK, Gr 2 DD, mild reduced RVSF, RVSP 41.8, severe LAE, mod RAE, mild to mod MR, mild to mod TR, AoV sclerosis (no AS)  . GERD (gastroesophageal reflux disease)   . H/O one miscarriage   . Hard of hearing    has hearing aides but does not wear   . History of hiatal hernia   . History of kidney stones   . Hyperlipidemia   . Hypertension   . Mitral valve regurgitation 05/2020   Moderate  . OA (osteoarthritis)    "right pointer; right hip" (01/05/2017)  . Obesity   . Renal insufficiency   . SSS (sick sinus syndrome) ([place]) 01/05/2017   a. s/p MDT dual chamber PPM   . Tinnitus   . Tricuspid valve regurgitation 05/2020   Mod  .  Varicose veins   . Wears glasses     Past Surgical History:  Procedure Laterality Date  . BIV UPGRADE N/A 05/30/2020   Procedure: BIV PPM UPGRADE;  Surgeon: Deboraha Sprang, MD;  Location: [place] [facility];  Service: Cardiovascular;  Laterality: N/A;  . BREAST CYST EXCISION Right 1977   "benign"  . CARDIOVERSION N/A 07/25/2016   Procedure: CARDIOVERSION;  Surgeon: Lelon Perla, MD;  Location[facility] ENDOSCOPY;  Service: Cardiovascular;   Laterality: N/A;  . CARDIOVERSION N/A 01/20/2019   Procedure: CARDIOVERSION;  Surgeon: Buford Dresser, MD;  Location: [facility] ENDOSCOPY;  Service: Cardiovascular;  Laterality: N/A;  . CARDIOVERSION N/A 12/05/2019   Procedure: CARDIOVERSION;  Surgeon: Dorothy Spark, MD;  Location: [facility] ENDOSCOPY;  Service: Cardiovascular;  Laterality: N/A;  . CARDIOVERSION N/A 05/07/2020   Procedure: CARDIOVERSION;  Surgeon: Thayer Headings, MD;  Location: [place];  Service: Cardiovascular;  Laterality: N/A;  . CARDIOVERSION N/A 12/06/2020   Procedure: CARDIOVERSION;  Surgeon: Lelon Perla, MD;  Location: [facility] ENDOSCOPY;  Service: Cardiovascular;  Laterality: N/A;  . CATARACT EXTRACTION W/ INTRAOCULAR LENS  IMPLANT, BILATERAL Bilateral   . DILATION AND CURETTAGE OF UTERUS    . IR THORACENTESIS ASP PLEURAL SPACE W/IMG GUIDE  11/16/2020  . NASAL SEPTUM SURGERY    . OVARIAN CYST REMOVAL  2002  . PACEMAKER IMPLANT N/A 01/05/2017   Procedure: Pacemaker Implant;  Surgeon: Doxie Augenstein Meredith Leeds, MD;  Location: [place] [facility];  Service: Cardiovascular;  Laterality: N/A;  . TEE WITHOUT CARDIOVERSION N/A 12/16/2016   Procedure: TRANSESOPHAGEAL ECHOCARDIOGRAM (TEE);  Surgeon: Larey Dresser, MD;  Location: [place];  Service: Cardiovascular;  Laterality: N/A;  . TEE WITHOUT CARDIOVERSION N/A 01/20/2019   Procedure: TRANSESOPHAGEAL ECHOCARDIOGRAM (TEE);  Surgeon: Buford Dresser, MD;  Location: [facility] ENDOSCOPY;  Service: Cardiovascular;  Laterality: N/A;  . TONSILLECTOMY    . TOTAL HIP ARTHROPLASTY Left 12/18/2015   Procedure: LEFT TOTAL HIP ARTHROPLASTY ANTERIOR APPROACH;  Surgeon: Paralee Cancel, MD;  Location: [facility] ORS;  Service: Orthopedics;  Laterality: Left;     Home Medications:  Prior to Admission medications   Medication Sig Start Date End Date Taking? Authorizing Provider  ALPRAZolam Duanne Moron) 0.5 MG tablet Take 0.5 mg by mouth at bedtime. Take 0.5 mg by mouth at bedtime and an additional 0.25-0.5 mg  once a day as needed for anxiety   Yes [provider]  amiodarone (PACERONE) 200 MG tablet Take 1 tablet (200 mg total) by mouth daily. 12/17/20 01/16/21 Yes Fenton, Clint R, PA  busPIRone (BUSPAR) 5 MG tablet Take 5 mg by mouth 2 (two) times daily.   Yes [provider]  Cyanocobalamin (B-12) 2500 MCG TABS Take 2,500 mcg by mouth every other day.    Yes [provider]  Magnesium 400 MG CAPS Take 400 mg by mouth 3 (three) times a week.    Yes [provider]  Multiple Vitamin (MULTIVITAMIN WITH MINERALS) TABS tablet Take 1 tablet by mouth daily.   Yes [provider]  potassium chloride SA (KLOR-CON) 20 MEQ tablet Take 1 tablet (20 mEq total) by mouth daily. Patient taking differently: Take 20 mEq by mouth daily with supper. 12/06/20  Yes Shirley Friar, PA-C  torsemide (DEMADEX) 10 MG tablet Take 1 tablet (10 mg total) by mouth daily. Patient taking differently: Take 40 mg by mouth in the morning. Take 40 mg by mouth in the morning, unless otherwise instructed by MD 12/04/20  Yes Shirley Friar, PA-C  XARELTO 15 MG TABS tablet TAKE 1 TABLET(15 MG) BY MOUTH DAILY WITH SUPPER Patient taking differently: Take 15 mg by mouth daily with supper. 07/23/20  Yes Deboraha Sprang, MD  acetaminophen (TYLENOL) 500 MG tablet Take 500-1,000 mg by mouth every 8 (eight) hours as needed for mild pain.    [provider]  Propylene Glycol (SYSTANE COMPLETE) 0.6 % SOLN Place 1 drop into both eyes 2 (two) times daily as needed (for dryness).    [provider]    Inpatient Medications: Scheduled Meds: . ALPRAZolam  0.5 mg Oral QHS  . busPIRone  5 mg Oral BID  . furosemide  80 mg Intravenous Q8H  . metoprolol succinate  25 mg Oral Daily  . potassium chloride SA  20 mEq Oral Daily  . Rivaroxaban  15 mg Oral Q supper  . sodium chloride flush  3 mL Intravenous Q12H   Continuous Infusions: . sodium chloride     PRN Meds: sodium  chloride, acetaminophen, ondansetron (ZOFRAN) IV, sodium chloride flush  Allergies:    Allergies  Allergen Reactions  . Ace Inhibitors Cough  . Amlodipine Other (See Comments) and Cough    Higher doses = VERY BAD COUGHING  . Celebrex [Celecoxib] Other (See Comments)    Reflux  . Codeine Nausea Only  . Rofecoxib Itching, Swelling and Other (See Comments)    Vioxx- legs swelling, itching  . Ether For Anesthesia [Ether] Other (See Comments) and Cough    Burning and dry cough  . Norvasc [Amlodipine Besylate] Other (See Comments) and Cough    Higher doses = VERY BAD COUGHING     Social History:   Social History   Socioeconomic History  . Marital status: Married    Spouse name: Not on file  . Number of children: Not on file  . Years of education: Not on file  . Highest education level: Not on file  Occupational History  . Not on file  Tobacco Use  . Smoking status: Never Smoker  . Smokeless tobacco: Never Used  Vaping Use  . Vaping Use: Never used  Substance and Sexual Activity  . Alcohol use: No  . Drug use: No  . Sexual activity: Not Currently  Other Topics Concern  . Not on file  Social History Narrative  . Not on file   Social Determinants of Health   Financial Resource Strain: Not on file  Food Insecurity: No Food Insecurity  . Worried About Charity fundraiser in the Last Year: Never true  . Ran Out of Food in the Last Year: Never true  Transportation Needs: No Transportation Needs  . Lack of Transportation (Medical): No  . Lack of Transportation (Non-Medical): No  Physical Activity: Not on file  Stress: Not on file  Social Connections: Not on file  Intimate Partner Violence: Not on file    Family History:   Family History  Problem Relation Age of Onset  . Diabetes Mother   . Heart disease Father   . Hypertension Sister   . Hypertension Brother      ROS:  Please see the history of present illness.  All other ROS reviewed and negative.      Physical Exam/Data:   Vitals:   01/09/21 0746 01/09/21 0951 01/09/21 1217 01/09/21 1543  BP: 115/72 103/65 (!) 101/58 111/68  Pulse: 71 70 69 70  Resp: 16 18 18 18   Temp: (!) 97.3 F (36.3 C)  (!) 97.4 F (36.3 C) (!) 97.3 F (  36.3 C)  TempSrc: Oral  Oral Oral  SpO2: 98% 100% 96% 100%  Weight:      Height:        Intake/Output Summary (Last 24 hours) at 01/09/2021 1654 Last data filed at 01/09/2021 1200 Gross per 24 hour  Intake 600 ml  Output 700 ml  Net -100 ml   Last 3 Weights 01/09/2021 01/08/2021 01/08/2021  Weight (lbs) 144 lb 6.4 oz 145 lb 4.5 oz 145 lb 6.4 oz  Weight (kg) 65.499 kg 65.9 kg 65.953 kg     Body mass index is 24.79 kg/m.  General:  Well nourished, well developed, though thin, in no acute distress HEENT: normal Lymph: no adenopathy Neck: no JVD Endocrine:  No thryomegaly Vascular: No carotid bruits Cardiac: RRR (paced); soft SM, no gallops or rubs Lungs:  Slight decreased at the bases, no wheezing, rhonchi or rales  Abd: soft, nontender Ext: no edema Musculoskeletal:  No deformities, age appropriate atrophy Skin: warm and dry  Neuro:  no focal abnormalities noted Psych:  Normal affect, very pleasent  EKG:  The EKG was personally reviewed and demonstrates:    VPaced 71bpm, RBBB morphology , + lead I, QRS 177ms  06/19/20: VP, RBBB morphology+ lead I, QRS 130ms (post CRT) 05/07/20: AV paced, QRS 288ms (pre-CRT)   Telemetry:  Telemetry was personally reviewed and demonstrates:   VPaced  Relevant CV Studies:  01/09/21: TTE IMPRESSIONS  1. Left ventricular ejection fraction, by estimation, is 35 to 40%. The  left ventricle has moderately decreased function. The left ventricle  demonstrates global hypokinesis with septal-lateral dyssynchrony. Left  ventricular diastolic parameters are  consistent with Grade II diastolic dysfunction (pseudonormalization).  2. Right ventricular systolic function is moderately reduced. The right  ventricular size is  moderately enlarged. There is mildly elevated  pulmonary artery systolic pressure, 42 mmHg. D-shaped interventricular  septum suggestive of RV pressure/volume  overload.  3. Left atrial size was moderately dilated.  4. Right atrial size was moderately dilated.  5. The mitral valve is degenerative. Severe mitral valve regurgitation.  Mitral regurgitation is eccentric and posteriorly-directed with anterior  leaflet prolapse and thickening. No evidence of mitral stenosis. Moderate  mitral annular calcification.  6. The tricuspid valve is abnormal. Tricuspid valve regurgitation is  moderate to severe.  7. The aortic valve is tricuspid. Aortic valve regurgitation is not  visualized. Mild to moderate aortic valve sclerosis/calcification is  present, without any evidence of aortic stenosis. The right coronary cusp  appears fixed.  8. The inferior vena cava is dilated in size with <50% respiratory  variability, suggesting right atrial pressure of 15 mmHg.  9. Left pleural effusion noted.    TTE 11/15/20: IMPRESSIONS  1. LVEF slightly decreased from the prior study, now 45-50% with diffuse  hypokinesis, otherwise no change from the prior study on 11/07/2020.  2. Left ventricular ejection fraction, by estimation, is 45 to 50%. The  left ventricle has mildly decreased function. The left ventricle  demonstrates global hypokinesis.  3. Right ventricular systolic function is moderately reduced. The right  ventricular size is moderately enlarged. There is mildly elevated  pulmonary artery systolic pressure. The estimated right ventricular  systolic pressure is 16.1 mmHg.  4. Left atrial size was severely dilated.  5. Right atrial size was moderately dilated.  6. A small pericardial effusion is present. The pericardial effusion is  posterior to the left ventricle. Moderate pleural effusion in the left  lateral region.  7. Moderate mitral regurgitation however can be  underestimated  due to  eccentric jet. The mitral valve is degenerative. Moderate mitral valve  regurgitation. No evidence of mitral stenosis. There is moderate prolapse  of multiple segments of the anterior  leaflet of the mitral valve. Moderate mitral annular calcification.  8. Tricuspid valve regurgitation is severe.  9. The aortic valve is normal in structure. There is moderate  calcification of the aortic valve. There is moderate thickening of the  aortic valve. Aortic valve regurgitation is trivial. Mild to moderate  aortic valve sclerosis/calcification is present,  without any evidence of aortic stenosis.  10. The inferior vena cava is dilated in size with <50% respiratory  variability, suggesting right atrial pressure of 15 mmHg.   TTE 11/07/20: IMPRESSIONS    1. Left ventricular ejection fraction, by estimation, is 50 to 55%. The  left ventricle has low normal function. The left ventricle has no regional  wall motion abnormalities. There is mild left ventricular hypertrophy.  Left ventricular diastolic  parameters are indeterminate.  2. Right ventricular systolic function is moderately reduced. The right  ventricular size is moderately enlarged. There is mildly elevated  pulmonary artery systolic pressure. The estimated right ventricular  systolic pressure is 42.6 mmHg.  3. Right atrial size was severely dilated.  4. The mitral valve is degenerative. Moderate mitral annular  calcification. No evidence of mitral stenosis. Moderate mitral valve  regurgitation. There is significant prolapse of the anterior leaflet with  posterior directed MR jet that appears moderate  but may be underestimating as eccentric jet and not well visualized on  apical views. Could consider TEE or cardiac MRI for further evaluation  5. Tricuspid valve regurgitation is moderate.  6. The aortic valve is tricuspid. Aortic valve regurgitation is not  visualized. Mild to moderate aortic valve  sclerosis/calcification is  present, without any evidence of aortic stenosis.  7. The inferior vena cava is dilated in size with <50% respiratory  variability, suggesting right atrial pressure of 15 mmHg.    Myoview 04/04/20: Nuclear stress EF: 37%. Global hypokinesis with apical dyskinesis.  The left ventricular ejection fraction is moderately decreased (30-44%).  There was no ST segment deviation noted during stress. Paced rhythm  Defect 1: There is a small defect of mild severity present in the apex location.  This is an intermediate risk study based upon reduced ejection fraction and apparent apical infarction.  Findings consistent with prior apical myocardial infarction.  TTE 03/09/20: IMPRESSIONS    1. LVEF is depressed at approximately 25 to 30% with severe hypokinesis  /akinesis of mid/distal lateral, distal anterior, mid/distal inferior,  mid/distal septal and apical walls   COmpared TEE report from 2020 and TTE report from 2018 LVEF is  depressed. The left ventricular internal cavity size was severely dilated.  Left ventricular diastolic parameters are consistent with Grade II  diastolic dysfunction (pseudonormalization).  2. Right ventricular systolic function is mildly reduced. The right  ventricular size is mildly enlarged. There is mildly elevated pulmonary  artery systolic pressure.  3. Left atrial size was severely dilated.  4. Right atrial size was moderately dilated.  5. The mitral valve is abnormal. Mild to moderate mitral valve  regurgitation.  6. TR is eccentric and directed toward the interatrial septum.. Tricuspid  valve regurgitation is mild to moderate.  7. The aortic valve is tricuspid. Aortic valve regurgitation is not  visualized. Mild to moderate aortic valve sclerosis/calcification is  present, without any evidence of aortic stenosis.  8. The inferior vena cava is dilated in  size with <50% respiratory  variability, suggesting right  atrial pressure of 15 mmHg.    Laboratory Data:  High Sensitivity Troponin:  No results for input(s): TROPONINIHS in the last 720 hours.   Chemistry Recent Labs  Lab 01/08/21 1055 01/08/21 2313 01/09/21 0422  NA 132* 132* 131*  K 4.7 4.9 4.7  CL 92* 96* 96*  CO2 20 24 25   GLUCOSE 124* 101* 88  BUN 59* 65* 62*  CREATININE 2.48* 2.55* 2.53*  CALCIUM 9.8 9.8 9.7  GFRNONAA  --  18* 18*  ANIONGAP  --  12 10    Recent Labs  Lab 01/08/21 2313  PROT 6.6  ALBUMIN 3.2*  AST 45*  ALT 36  ALKPHOS 120  BILITOT 2.5*   HematologyNo results for input(s): WBC, RBC, HGB, HCT, MCV, MCH, MCHC, RDW, PLT in the last 168 hours. BNP Recent Labs  Lab 01/08/21 1055 01/08/21 2313  BNP  --  539.1*  PROBNP 1,494*  --     DDimer No results for input(s): DDIMER in the last 168 hours.   Radiology/Studies:  [facility] Chest Port 1 View Result Date: 01/09/2021 CLINICAL DATA:  Chronic heart failure.  Productive cough. EXAM: PORTABLE CHEST 1 VIEW COMPARISON:  11/18/2020. FINDINGS: AICD in stable position. Stable cardiomegaly. Low lung volumes with mild bibasilar atelectasis. Stable small left pleural effusion. No pneumothorax. IMPRESSION: 1. AICD in stable position. Stable cardiomegaly. 2. Low lung volumes with mild bibasilar atelectasis. Stable small left pleural effusion. Electronically Signed   By: Marcello Moores  Register   On: 01/09/2021 05:37      Assessment and Plan:   1. Acute/chronic CHF exacerbations  She underwent CRT upgrade last summer and initially had good improvement in her LVEF She has had worsening of her RV to moderate RV dysfunction, as well and progressive worsening of her TR to severe She also has DD grade II  Device interrogation in d/w industry rep LV lead threshold was at or perhaps above outputs and increased LV lead outputs appropriately VP% 100%, effective 99.9% by the device Morphology of her EKG is not different from post CRT upgrade, QRS has widened by 27ms OptiVol appears  to have already noted improvement with her diuresis, though was on the rise, had not crossed threshold interestingly, given she was markedly overloaded.  I Marsa Matteo review with MD, we may try to optimize V-V timing, not sure if echo-opt is something to think about in the future.   2. Longstanding persistent AFib     CHA2DS2Vasc is 5, on Xarelto, appropriately dosed at 15mg  daily      Calc CrCl today is 16, need to watch closely with her Xarelto, baseline creat looks about 1.8-2.0  Looks like thoughts by last EP APP visit was towards rate control strategy (given failed DCCV on amiodarone with ERAF) She has CHB with no evidence of underlying conduction, I do not suspect we Catrena Vari run into rate control issues. Agree with stopping amio Discuss VVIR programming as well perhaps outpatient.      Risk Assessment/Risk Scores:  {  For questions or updates, please contact [place] Please consult [email or web address] for contact info under    Signed, Baldwin Jamaica, PA-C  01/09/2021 4:54 PM  I have seen and examined this patient with Tommye Standard.  Agree with above, note added to reflect my findings.  On exam, RRR, no murmurs, lungs clear. Patient presented with CHF exacerbation. Asked to optimize her CRT-P. LV lead was pacing under threshold. Adjusted pacing output. No  further changes made. If further optimization is necessary can be considered in clinic.  Vega Stare M. Cache Bills MD 01/09/2021 8:00 PM

## 2021-01-10 ENCOUNTER — Inpatient Hospital Stay (HOSPITAL_COMMUNITY): Payer: Medicare Other

## 2021-01-10 DIAGNOSIS — Z7189 Other specified counseling: Secondary | ICD-10-CM

## 2021-01-10 DIAGNOSIS — N179 Acute kidney failure, unspecified: Secondary | ICD-10-CM

## 2021-01-10 DIAGNOSIS — Z66 Do not resuscitate: Secondary | ICD-10-CM

## 2021-01-10 DIAGNOSIS — Z515 Encounter for palliative care: Secondary | ICD-10-CM

## 2021-01-10 LAB — URINALYSIS, ROUTINE W REFLEX MICROSCOPIC
Bilirubin Urine: NEGATIVE
Glucose, UA: NEGATIVE mg/dL
Ketones, ur: NEGATIVE mg/dL
Nitrite: NEGATIVE
Protein, ur: NEGATIVE mg/dL
Specific Gravity, Urine: 1.011 (ref 1.005–1.030)
pH: 6 (ref 5.0–8.0)

## 2021-01-10 LAB — BASIC METABOLIC PANEL
Anion gap: 11 (ref 5–15)
BUN: 67 mg/dL — ABNORMAL HIGH (ref 8–23)
CO2: 26 mmol/L (ref 22–32)
Calcium: 9.4 mg/dL (ref 8.9–10.3)
Chloride: 94 mmol/L — ABNORMAL LOW (ref 98–111)
Creatinine, Ser: 2.92 mg/dL — ABNORMAL HIGH (ref 0.44–1.00)
GFR, Estimated: 15 mL/min — ABNORMAL LOW (ref >60)
Glucose, Bld: 97 mg/dL (ref 70–99)
Potassium: 4.6 mmol/L (ref 3.5–5.1)
Sodium: 131 mmol/L — ABNORMAL LOW (ref 135–145)

## 2021-01-10 MED ORDER — METOLAZONE 5 MG PO TABS
10.0000 mg | ORAL_TABLET | Freq: Once | ORAL | Status: AC
  Administered 2021-01-10: 10 mg via ORAL
  Filled 2021-01-10: qty 2

## 2021-01-10 NOTE — Progress Notes (Signed)
Progress Note  [name] Date of Encounter: 01/10/2021  Primary Cardiologist: Dr Virl Axe, MD  Subjective   Patient presented with worsening shortness of breath which she notes has slightly improved with diuresis. She is currently on 2L [place].   Inpatient Medications    Scheduled Meds:  ALPRAZolam  0.5 mg Oral QHS   busPIRone  5 mg Oral BID   furosemide  80 mg Intravenous Q8H   metoprolol succinate  25 mg Oral Daily   potassium chloride SA  20 mEq Oral Daily   Rivaroxaban  15 mg Oral Q supper   sodium chloride flush  3 mL Intravenous Q12H   Continuous Infusions:  sodium chloride     PRN Meds: sodium chloride, acetaminophen, guaiFENesin-dextromethorphan, ondansetron (ZOFRAN) IV, sodium chloride flush   Vital Signs    Vitals:   01/09/21 2052 01/10/21 0017 01/10/21 0505 01/10/21 0759  BP: (!) 108/59 105/67 102/64 103/65  Pulse: 70 72 70 69  Resp: 18 20 20 16   Temp: (!) 97.4 F (36.3 C) 98.5 F (36.9 C) (!) 97.4 F (36.3 C)   TempSrc: Oral Oral Oral   SpO2: 100% 100% 100% 100%  Weight:   65.5 kg   Height:        Intake/Output Summary (Last 24 hours) at 01/10/2021 0835 Last data filed at 01/10/2021 0800 Gross per 24 hour  Intake 960 ml  Output 600 ml  Net 360 ml   Filed Weights   01/08/21 1737 01/09/21 0524 01/10/21 0505  Weight: 65.9 kg 65.5 kg 65.5 kg    Telemetry    Ventricular paced rhythm with multiple PVC's - Personally Reviewed  ECG    Ventricular paced rhythm; LAD w/RBBB - Personally Reviewed  Physical Exam   Physical Exam  Constitutional: elderly female, appears well-developed and well-nourished. No distress.  HENT: Normocephalic and atraumatic, EOMI, conjunctiva normal, moist mucous membranes, +JVD Cardiovascular: Normal rate, regular rhythm, S1 and S2 present, + harsh, holosystolic murmur  Respiratory: No respiratory distress, no accessory muscle use.  Effort is normal.  Diffuse crackles on auscultation, 2L [place] GI:  Nondistended, soft, nontender to palpation, normal active bowel sounds Musculoskeletal: Normal bulk and tone. 2+ pitting edema to mid tibia  Neurological: Is alert and oriented x4, no apparent focal deficits noted. Skin: Warm and dry.  No rash, erythema, lesions noted. Psychiatric: Normal mood and affect. Behavior is normal. Judgment and thought content normal.   Labs    Chemistry Recent Labs  Lab 01/08/21 2313 01/09/21 0422 01/10/21 0342  NA 132* 131* 131*  K 4.9 4.7 4.6  CL 96* 96* 94*  CO2 24 25 26   GLUCOSE 101* 88 97  BUN 65* 62* 67*  CREATININE 2.55* 2.53* 2.92*  CALCIUM 9.8 9.7 9.4  PROT 6.6  --   --   ALBUMIN 3.2*  --   --   AST 45*  --   --   ALT 36  --   --   ALKPHOS 120  --   --   BILITOT 2.5*  --   --   GFRNONAA 18* 18* 15*  ANIONGAP 12 10 11      HematologyNo results for input(s): WBC, RBC, HGB, HCT, MCV, MCH, MCHC, RDW, PLT in the last 168 hours.  Cardiac EnzymesNo results for input(s): TROPONINI in the last 168 hours. No results for input(s): TROPIPOC in the last 168 hours.   BNP Recent Labs  Lab 01/08/21 1055 01/08/21 2313  BNP  --  539.1*  PROBNP 1,494*  --      DDimer No results for input(s): DDIMER in the last 168 hours.   Radiology    DG Chest Port 1 View  Result Date: 01/09/2021 CLINICAL DATA:  Chronic heart failure.  Productive cough. EXAM: PORTABLE CHEST 1 VIEW COMPARISON:  11/18/2020. FINDINGS: AICD in stable position. Stable cardiomegaly. Low lung volumes with mild bibasilar atelectasis. Stable small left pleural effusion. No pneumothorax. IMPRESSION: 1. AICD in stable position. Stable cardiomegaly. 2. Low lung volumes with mild bibasilar atelectasis. Stable small left pleural effusion. Electronically Signed   By: Marcello Moores  Register   On: 01/09/2021 05:37   ECHOCARDIOGRAM LIMITED  Result Date: 01/09/2021    ECHOCARDIOGRAM REPORT   Patient Name:   [name] Date of Exam: 01/09/2021 Medical Rec #:  [id number]      Height:       64.0 in  Accession #:    [id number]     Weight:       144.4 lb Date of Birth:  [birth year]      BSA:          1.703 m Patient Age:    87 years       BP:           103/65 mmHg Patient Gender: F              HR:           71 bpm. Exam Location:  Inpatient Procedure: Limited Color Doppler, Color Doppler and Cardiac Doppler Indications:    acute systolic chf  History:        Patient has prior history of Echocardiogram examinations, most                 recent 11/15/2020. Pacemaker, chronic kidney disease, Mitral Valve                 Disease, Arrythmias:sick sinus syndrome and Atrial Fibrillation;                 Risk Factors:Hypertension.  Sonographer:    Johny Chess Referring Phys: [id number] [place]  1. Left ventricular ejection fraction, by estimation, is 35 to 40%. The left ventricle has moderately decreased function. The left ventricle demonstrates global hypokinesis with septal-lateral dyssynchrony. Left ventricular diastolic parameters are consistent with Grade II diastolic dysfunction (pseudonormalization).  2. Right ventricular systolic function is moderately reduced. The right ventricular size is moderately enlarged. There is mildly elevated pulmonary artery systolic pressure, 42 mmHg. D-shaped interventricular septum suggestive of RV pressure/volume overload.  3. Left atrial size was moderately dilated.  4. Right atrial size was moderately dilated.  5. The mitral valve is degenerative. Severe mitral valve regurgitation. Mitral regurgitation is eccentric and posteriorly-directed with anterior leaflet prolapse and thickening. No evidence of mitral stenosis. Moderate mitral annular calcification.  6. The tricuspid valve is abnormal. Tricuspid valve regurgitation is moderate to severe.  7. The aortic valve is tricuspid. Aortic valve regurgitation is not visualized. Mild to moderate aortic valve sclerosis/calcification is present, without any evidence of aortic stenosis. The right coronary cusp  appears fixed.  8. The inferior vena cava is dilated in size with <50% respiratory variability, suggesting right atrial pressure of 15 mmHg.  9. Left pleural effusion noted. FINDINGS  Left Ventricle: Left ventricular ejection fraction, by estimation, is 35 to 40%. The left ventricle has moderately decreased function. The left ventricle demonstrates global hypokinesis. The left ventricular internal cavity size was normal in size. There  is no left ventricular hypertrophy. Left ventricular diastolic parameters are consistent with Grade II diastolic dysfunction (pseudonormalization). Right Ventricle: The right ventricular size is moderately enlarged. No increase in right ventricular wall thickness. Right ventricular systolic function is moderately reduced. There is mildly elevated pulmonary artery systolic pressure. The tricuspid regurgitant velocity is 2.58 m/s, and with an assumed right atrial pressure of 15 mmHg, the estimated right ventricular systolic pressure is 58.8 mmHg. Left Atrium: Left atrial size was moderately dilated. Right Atrium: Right atrial size was moderately dilated. Pericardium: Left pleural effusion noted. Trivial pericardial effusion is present. Mitral Valve: The mitral valve is degenerative in appearance. There is mild calcification of the mitral valve leaflet(s). Moderate mitral annular calcification. Severe mitral valve regurgitation. No evidence of mitral valve stenosis. Tricuspid Valve: The tricuspid valve is abnormal. Tricuspid valve regurgitation is moderate to severe. Aortic Valve: The aortic valve is tricuspid. Aortic valve regurgitation is not visualized. Mild to moderate aortic valve sclerosis/calcification is present, without any evidence of aortic stenosis. Pulmonic Valve: The pulmonic valve was normal in structure. Pulmonic valve regurgitation is trivial. Aorta: The aortic root is normal in size and structure. Venous: The inferior vena cava is dilated in size with less than 50%  respiratory variability, suggesting right atrial pressure of 15 mmHg. IAS/Shunts: No atrial level shunt detected by color flow Doppler. Additional Comments: A device lead is visualized in the right ventricle.  LEFT VENTRICLE PLAX 2D LVIDd:         4.60 cm LVIDs:         3.50 cm LV PW:         1.00 cm LV IVS:        0.90 cm LVOT diam:     1.90 cm LV SV:         32 LV SV Index:   19 LVOT Area:     2.84 cm  RIGHT VENTRICLE            IVC RV S prime:     9.25 cm/s  IVC diam: 2.40 cm TAPSE (M-mode): 1.3 cm LEFT ATRIUM         Index LA diam:    4.90 cm 2.88 cm/m  AORTIC VALVE LVOT Vmax:   62.90 cm/s LVOT Vmean:  39.500 cm/s LVOT VTI:    0.113 m MITRAL VALVE                 TRICUSPID VALVE MV Area (PHT): 3.91 cm      TR Peak grad:   26.6 mmHg MV Decel Time: 194 msec      TR Vmax:        258.00 cm/s MR Peak grad:    103.2 mmHg MR Mean grad:    62.0 mmHg   SHUNTS MR Vmax:         508.00 cm/s Systemic VTI:  0.11 m MR Vmean:        365.0 cm/s  Systemic Diam: 1.90 cm MR PISA:         3.08 cm MR PISA Eff ROA: 24 mm MR PISA Radius:  0.70 cm MV E velocity: 123.00 cm/s MV A velocity: 61.60 cm/s MV E/A ratio:  2.00 Loralie Champagne MD Electronically signed by Loralie Champagne MD Signature Date/Time: 01/09/2021/4:29:57 PM    Final     Cardiac Studies   TTE 11/15/2020 1. LVEF slightly decreased from the prior study, now 45-50% with diffuse  hypokinesis, otherwise no change from the prior study on 11/07/2020.  2. Left  ventricular ejection fraction, by estimation, is 45 to 50%. The  left ventricle has mildly decreased function. The left ventricle  demonstrates global hypokinesis.  3. Right ventricular systolic function is moderately reduced. The right  ventricular size is moderately enlarged. There is mildly elevated  pulmonary artery systolic pressure. The estimated right ventricular  systolic pressure is 55.7 mmHg.  4. Left atrial size was severely dilated.  5. Right atrial size was moderately dilated.  6. A small  pericardial effusion is present. The pericardial effusion is  posterior to the left ventricle. Moderate pleural effusion in the left  lateral region.  7. Moderate mitral regurgitation however can be underestimated due to  eccentric jet. The mitral valve is degenerative. Moderate mitral valve  regurgitation. No evidence of mitral stenosis. There is moderate prolapse  of multiple segments of the anterior  leaflet of the mitral valve. Moderate mitral annular calcification.  8. Tricuspid valve regurgitation is severe.  9. The aortic valve is normal in structure. There is moderate  calcification of the aortic valve. There is moderate thickening of the  aortic valve. Aortic valve regurgitation is trivial. Mild to moderate  aortic valve sclerosis/calcification is present,  without any evidence of aortic stenosis.  10. The inferior vena cava is dilated in size with <50% respiratory  variability, suggesting right atrial pressure of 15 mmHg.   TTE 01/09/2021 IMPRESSIONS  1. Left ventricular ejection fraction, by estimation, is 35 to 40%. The left ventricle has moderately decreased function. The left ventricle demonstrates global hypokinesis with septal-lateral dyssynchrony. Left ventricular diastolic parameters are consistent with Grade II diastolic dysfunction (pseudonormalization).  2. Right ventricular systolic function is moderately reduced. The right ventricular size is moderately enlarged. There is mildly elevated pulmonary artery systolic pressure, 42 mmHg. D-shaped interventricular septum suggestive of RV pressure/volume overload.  3. Left atrial size was moderately dilated.  4. Right atrial size was moderately dilated.  5. The mitral valve is degenerative. Severe mitral valve regurgitation. Mitral regurgitation is eccentric and posteriorly-directed with anterior leaflet prolapse and thickening. No evidence of mitral stenosis. Moderate mitral annular calcification.  6. The tricuspid  valve is abnormal. Tricuspid valve regurgitation is moderate to severe.  7. The aortic valve is tricuspid. Aortic valve regurgitation is not visualized. Mild to moderate aortic valve sclerosis/calcification is present, without any evidence of aortic stenosis. The right coronary cusp appears fixed.  8. The inferior vena cava is dilated in size with <50% respiratory variability, suggesting right atrial pressure of 15 mmHg.  9. Left pleural effusion noted.   Patient Profile     87 y.o. female with PMHx of non-ischemic cardiomyopathy secondary to RV pacing with upgrade to CRT-P, persistent A fib on Xarelto, high-grade AV block s/p pacemaker, hypothyroidism, CKD IV admitted for acute decompensated heart failure.   Assessment & Plan    Acute decompensated systolic and diastolic heart failure Echo with reduced LV and RV systolic function. Also noted to have tricuspid and mitral valve regurgitation. She received IV Lasix with only 700cc UOP over past 24 hours. Patient's CRT-P pacing output adjusted by EP as well. She did have increase in her sCr with diuresis. Continues to be significantly hypervolemic on exam. Diuresis difficult given her advanced CKD. - Continue IV Lasix 80mg  tid  - Will add metolazone 10mg  once to augment diuresis - Strict I&O  - Continue cardiac monitoring   Severe tricuspid regurgitation/ severe mitral regurgitation Has severe mitral valve and tricuspid valve regurgitation on Echo. Given her worsening heart failure with LVEF  and recurrent admissions for heart failure (last admission was in December 2021), patient would likely benefit from surgical intervention. However, given her advanced age and other comorbidities, doubt that she would be a good surgical candidate at this time.  - Continue to monitor  - Palliative care consulted for [place] discussion    Persistent A.fib s/p PPM CHADs-VASc 5. She had DCCV on 1/27 but was noted to be back in A.fib on follow up appointments. She  is on Xarelto. Amiodarone switched to metoprolol yesterday.  - Continue metoprolol succinate 25mg  daily  - Continue Xarelto   AKI on CKD IV Renal function worse this morning with IV Lasix. Has a history of advanced renal disease but has not followed up with nephrologist. Discussion with patient and daughter at bedside. Doubt that patient would be good candidate for dialysis and her daughter does not feel like patient would want dialysis if her kidneys were to worsen. Bedside bladder US with 10ml urine.  - Renal US and UA to evaluate for any reversible causes of her worsening renal function  - Continue to monitor renal function - Avoid nephrotoxic agents as able  - Palliative care consulted for [place] discussion   Signed,  Harvie Heck, MD Internal Medicine, PGY-2 01/10/2021, 8:35 AM   Pager#: [phone]

## 2021-01-10 NOTE — Progress Notes (Addendum)
   Placed Palliative Care consult via Epic at the request of Dr. Audie Box. Also called and left message on their number on Amion.  Darreld Mclean, PA-C 01/10/2021 11:36 AM

## 2021-01-10 NOTE — Consult Note (Signed)
Consultation Note Date: 01/10/2021   [name]  DOB: 10-05-1930  MRN: [id number]  Age / Sex: 87 y.o., female  PCP: Chesley Noon, MD Referring Physician: Freada Bergeron, MD  Reason for Consultation: Establishing goals of care  HPI/Patient Profile: 87 y.o. female  with past medical history of NICM, persistent a fib, SSS w PPM, and hypothyroidism admitted on 01/08/2021 with acute decompensated heart failure. Admitted with volume overload. Course complicated by AKI. PMT consulted for [place] discussion.   Clinical Assessment and Goals of Care: I have reviewed medical records including EPIC notes, labs and imaging, assessed the patient and then met with patient, her daughter [name], and her sister to discuss diagnosis prognosis, GOC, EOL wishes, disposition and options.  I introduced Palliative Medicine as specialized medical care for people living with serious illness. It focuses on providing relief from the symptoms and stress of a serious illness. The goal is to improve quality of life for both the patient and the family.  Family tells me patient was independent prior to admission. Ambulatory. Good appetite. No cognitive decline.    We discussed patient's current illness and what it means in the larger context of patient's on-going co-morbidities. Discuss heart and kidney failure.   I attempted to elicit values and goals of care important to the patient. Patient and family tell me that patient is not interested in hemodialysis, CPR, or mechanical ventilation. They would like to continue with current measures and allow time for outcomes. They share they are open to hospice if it is determined appropriate.     Discussed with patient/family the importance of continued conversation with family and the medical providers regarding overall plan of care and treatment options, ensuring decisions are within the context of the patient's  values and GOCs.    Questions and concerns were addressed. The family was encouraged to call with questions or concerns.   Primary Decision Maker PATIENT - joined by daughters  SUMMARY OF RECOMMENDATIONS   - DNR, NO HD - allow time for outcomes - family open to hospice depending on hospital course - will continue to follow, plan to see again 3/4  Code Status/Advance Care Planning:  DNR  Symptom Management:   Patient denies pain, shortness of breath  Additional Recommendations (Limitations, Scope, Preferences):  No Hemodialysis  Discharge Planning: To Be Determined      Primary Diagnoses: Present on Admission: . Sick sinus syndrome ([place]) . Hypertension . Persistent atrial fibrillation ([place]) . CKD (chronic kidney disease) stage 3, GFR 30-59 ml/min (HCC)   I have reviewed the medical record, interviewed the patient and family, and examined the patient. The following aspects are pertinent.  Past Medical History:  Diagnosis Date  . Anxiety   . Aortic valve sclerosis   . Atrial fibrillation ([place])   . Basal cell carcinoma    hx; near eyes  . Chronic systolic CHF (congestive heart failure) (HCC)    Echocardiogram 03/2020: EF 25-30, mid-dist lat, dist ant, mid-dist inf, mid-dist septal and apical severe HK/AK, Gr 2 DD, mild reduced RVSF, RVSP 41.8, severe LAE, mod RAE, mild to mod MR, mild to mod TR, AoV sclerosis (no AS)  . GERD (gastroesophageal reflux disease)   . H/O one miscarriage   . Hard of hearing    has hearing aides but does not wear   . History of hiatal hernia   . History of kidney stones   . Hyperlipidemia   . Hypertension   . Mitral valve  regurgitation 05/2020   Moderate  . OA (osteoarthritis)    "right pointer; right hip" (01/05/2017)  . Obesity   . Renal insufficiency   . SSS (sick sinus syndrome) ([place]) 01/05/2017   a. s/p MDT dual chamber PPM   . Tinnitus   . Tricuspid valve regurgitation 05/2020   Mod  . Varicose veins   . Wears glasses     Social History   Socioeconomic History  . Marital status: Married    Spouse name: Not on file  . Number of children: Not on file  . Years of education: Not on file  . Highest education level: Not on file  Occupational History  . Not on file  Tobacco Use  . Smoking status: Never Smoker  . Smokeless tobacco: Never Used  Vaping Use  . Vaping Use: Never used  Substance and Sexual Activity  . Alcohol use: No  . Drug use: No  . Sexual activity: Not Currently  Other Topics Concern  . Not on file  Social History Narrative  . Not on file   Social Determinants of Health   Financial Resource Strain: Not on file  Food Insecurity: No Food Insecurity  . Worried About Charity fundraiser in the Last Year: Never true  . Ran Out of Food in the Last Year: Never true  Transportation Needs: No Transportation Needs  . Lack of Transportation (Medical): No  . Lack of Transportation (Non-Medical): No  Physical Activity: Not on file  Stress: Not on file  Social Connections: Not on file   Family History  Problem Relation Age of Onset  . Diabetes Mother   . Heart disease Father   . Hypertension Sister   . Hypertension Brother    Scheduled Meds: . ALPRAZolam  0.5 mg Oral QHS  . busPIRone  5 mg Oral BID  . furosemide  80 mg Intravenous Q8H  . metoprolol succinate  25 mg Oral Daily  . potassium chloride SA  20 mEq Oral Daily  . Rivaroxaban  15 mg Oral Q supper  . sodium chloride flush  3 mL Intravenous Q12H   Continuous Infusions: . sodium chloride     PRN Meds:.sodium chloride, acetaminophen, guaiFENesin-dextromethorphan, ondansetron (ZOFRAN) IV, sodium chloride flush Allergies  Allergen Reactions  . Ace Inhibitors Cough  . Amlodipine Other (See Comments) and Cough    Higher doses = VERY BAD COUGHING  . Celebrex [Celecoxib] Other (See Comments)    Reflux  . Codeine Nausea Only  . Rofecoxib Itching, Swelling and Other (See Comments)    Vioxx- legs swelling, itching  . Ether  For Anesthesia [Ether] Other (See Comments) and Cough    Burning and dry cough  . Norvasc [Amlodipine Besylate] Other (See Comments) and Cough    Higher doses = VERY BAD COUGHING    Review of Systems  Constitutional: Positive for activity change and fatigue.  Neurological: Positive for weakness.    Physical Exam Pulmonary:     Effort: Pulmonary effort is normal. No respiratory distress.  Skin:    General: Skin is warm and dry.  Neurological:     Mental Status: She is alert and oriented to person, place, and time.     Vital Signs: BP 103/65   Pulse 69   Temp (!) 97.4 F (36.3 C) (Oral)   Resp 16   Ht _0  (1.626 m)   Wt 65.5 kg Comment: scale c  SpO2 100%   BMI 24.80 kg/m  Pain Scale: 0-10  Pain Score: 0-No pain   SpO2: SpO2: 100 % O2 Device:SpO2: 100 % O2 Flow Rate: .O2 Flow Rate (L/min): 2 L/min  IO: Intake/output summary:   Intake/Output Summary (Last 24 hours) at 01/10/2021 1521 Last data filed at 01/10/2021 1127 Gross per 24 hour  Intake 720 ml  Output 650 ml  Net 70 ml    LBM: Last BM Date: 01/09/21 Baseline Weight: Weight: 65.9 kg (scale c) Most recent weight: Weight: 65.5 kg (scale c)     Palliative Assessment/Data: PPS 50%    Time Total: 40 minutes Greater than 50%  of this time was spent counseling and coordinating care related to the above assessment and plan.  Juel Burrow, DNP, AGNP-C Palliative Medicine Team [phone] Pager: [phone]

## 2021-01-11 LAB — BASIC METABOLIC PANEL
Anion gap: 14 (ref 5–15)
BUN: 71 mg/dL — ABNORMAL HIGH (ref 8–23)
CO2: 25 mmol/L (ref 22–32)
Calcium: 9.5 mg/dL (ref 8.9–10.3)
Chloride: 92 mmol/L — ABNORMAL LOW (ref 98–111)
Creatinine, Ser: 2.81 mg/dL — ABNORMAL HIGH (ref 0.44–1.00)
GFR, Estimated: 16 mL/min — ABNORMAL LOW (ref >60)
Glucose, Bld: 89 mg/dL (ref 70–99)
Potassium: 3.3 mmol/L — ABNORMAL LOW (ref 3.5–5.1)
Sodium: 131 mmol/L — ABNORMAL LOW (ref 135–145)

## 2021-01-11 LAB — CBC
HCT: 31.2 % — ABNORMAL LOW (ref 36.0–46.0)
Hemoglobin: 9.5 g/dL — ABNORMAL LOW (ref 12.0–15.0)
MCH: 28.4 pg (ref 26.0–34.0)
MCHC: 30.4 g/dL (ref 30.0–36.0)
MCV: 93.1 fL (ref 80.0–100.0)
Platelets: 163 10*3/uL (ref 150–400)
RBC: 3.35 MIL/uL — ABNORMAL LOW (ref 3.87–5.11)
RDW: 18.1 % — ABNORMAL HIGH (ref 11.5–15.5)
WBC: 6.7 10*3/uL (ref 4.0–10.5)
nRBC: 0 % (ref 0.0–0.2)

## 2021-01-11 MED ORDER — SALINE SPRAY 0.65 % NA SOLN
1.0000 | NASAL | Status: DC | PRN
  Administered 2021-01-11: 1 via NASAL
  Filled 2021-01-11: qty 44

## 2021-01-11 MED ORDER — FUROSEMIDE 10 MG/ML IJ SOLN
100.0000 mg | Freq: Two times a day (BID) | INTRAVENOUS | Status: AC
  Administered 2021-01-11 (×2): 100 mg via INTRAVENOUS
  Filled 2021-01-11 (×2): qty 10

## 2021-01-11 MED ORDER — METOLAZONE 5 MG PO TABS
10.0000 mg | ORAL_TABLET | Freq: Once | ORAL | Status: AC
  Administered 2021-01-11: 10 mg via ORAL
  Filled 2021-01-11: qty 2

## 2021-01-11 MED ORDER — POTASSIUM CHLORIDE CRYS ER 20 MEQ PO TBCR
40.0000 meq | EXTENDED_RELEASE_TABLET | ORAL | Status: AC
  Administered 2021-01-11 (×2): 40 meq via ORAL
  Filled 2021-01-11 (×2): qty 2

## 2021-01-11 MED ORDER — APIXABAN 2.5 MG PO TABS
2.5000 mg | ORAL_TABLET | Freq: Two times a day (BID) | ORAL | Status: DC
  Administered 2021-01-11 – 2021-01-12 (×2): 2.5 mg via ORAL
  Filled 2021-01-11 (×2): qty 1

## 2021-01-11 NOTE — Discharge Instructions (Addendum)
Heart Failure Education: 1. Weigh yourself EVERY morning after you go to the bathroom but before you eat or drink anything. Write this number down in a weight log/diary. If you gain 3 pounds overnight or 5 pounds in a week, call the office. 2. Take your medicines as prescribed. If you have concerns about your medications, please call us before you stop taking them.  3. Eat low salt foods--Limit salt (sodium) to 2000 mg per day. This will help prevent your body from holding onto fluid. Read food labels as many processed foods have a lot of sodium, especially canned goods and prepackaged meats. If you would like some assistance choosing low sodium foods, we would be happy to set you up with a nutritionist. 4. Stay as active as you can everyday. Staying active will give you more energy and make your muscles stronger. Start with 5 minutes at a time and work your way up to 30 minutes a day. Break up your activities--do some in the morning and some in the afternoon. Start with 3 days per week and work your way up to 5 days as you can.  If you have chest pain, feel short of breath, dizzy, or lightheaded, STOP. If you don't feel better after a short rest, call 911. If you do feel better, call the office to let us know you have symptoms with exercise. 5. Limit all fluids for the day to less than 2 liters. Fluid includes all drinks, coffee, juice, ice chips, soup, jello, and all other liquids.   Medication changes: - STOP xarelto - this medication is being replaced by apixaban (eliquis) - STOP amiodarone - START apixaban 2.5mg   - take 1 tablet 2 times per day to prevent strokes - START metoprolol succinate (Toprol-XL) 25mg  daily for your heart failure and to help control your heart rate with afib - INCREASE torsemide to 20mg  daily.   Information on my medicine - ELIQUIS (apixaban)  Why was Eliquis prescribed for you? Eliquis was prescribed for you to reduce the risk of a blood clot forming that can cause  a stroke if you have a medical condition called atrial fibrillation (a type of irregular heartbeat).  What do You need to know about Eliquis ? Take your Eliquis TWICE DAILY - one tablet in the morning and one tablet in the evening with or without food. If you have difficulty swallowing the tablet whole please discuss with your pharmacist how to take the medication safely.  Take Eliquis exactly as prescribed by your doctor and DO NOT stop taking Eliquis without talking to the doctor who prescribed the medication.  Stopping may increase your risk of developing a stroke.  Refill your prescription before you run out.  After discharge, you should have regular check-up appointments with your healthcare provider that is prescribing your Eliquis.  In the future your dose may need to be changed if your kidney function or weight changes by a significant amount or as you get older.  What do you do if you miss a dose? If you miss a dose, take it as soon as you remember on the same day and resume taking twice daily.  Do not take more than one dose of ELIQUIS at the same time to make up a missed dose.  Important Safety Information A possible side effect of Eliquis is bleeding. You should call your healthcare provider right away if you experience any of the following: ? Bleeding from an injury or your nose that does not stop. ?  Unusual colored urine (red or dark brown) or unusual colored stools (red or black). ? Unusual bruising for unknown reasons. ? A serious fall or if you hit your head (even if there is no bleeding).  Some medicines may interact with Eliquis and might increase your risk of bleeding or clotting while on Eliquis. To help avoid this, consult your healthcare provider or pharmacist prior to using any new prescription or non-prescription medications, including herbals, vitamins, non-steroidal anti-inflammatory drugs (NSAIDs) and supplements.  This website has more information on Eliquis  (apixaban): http://www.eliquis.com/eliquis/home

## 2021-01-11 NOTE — Plan of Care (Signed)
  Problem: Education: Goal: Knowledge of General Education information will improve Description Including pain rating scale, medication(s)/side effects and non-pharmacologic comfort measures Outcome: Progressing   Problem: Health Behavior/Discharge Planning: Goal: Ability to manage health-related needs will improve Outcome: Progressing   

## 2021-01-11 NOTE — Care Management Important Message (Signed)
Important Message  Patient Details  Name: [name] MRN: [id number] Date of Birth: [birth year]   Medicare Important Message Given:  Yes     [name] 01/11/2021, 10:13 AM

## 2021-01-11 NOTE — Progress Notes (Signed)
Cardiology Progress Note  Patient ID: [name] MRN: [id number] DOB: 08/13/1930 Date of Encounter: 01/11/2021  Primary Cardiologist: Virl Axe, MD  Subjective   Chief Complaint: Shortness of breath  HPI: Urine output is picked up.  Creatinine trending down.  No obstruction on renal ultrasound.  DNR/DNI.  Not interested in hemodialysis.  EF is dropped.  Overall her care is palliative.  ROS:  All other ROS reviewed and negative. Pertinent positives noted in the HPI.     Inpatient Medications  Scheduled Meds: . ALPRAZolam  0.5 mg Oral QHS  . busPIRone  5 mg Oral BID  . metoprolol succinate  25 mg Oral Daily  . Rivaroxaban  15 mg Oral Q supper  . sodium chloride flush  3 mL Intravenous Q12H   Continuous Infusions: . sodium chloride    . furosemide     PRN Meds: sodium chloride, acetaminophen, guaiFENesin-dextromethorphan, ondansetron (ZOFRAN) IV, sodium chloride, sodium chloride flush   Vital Signs   Vitals:   01/11/21 0034 01/11/21 0353 01/11/21 0500 01/11/21 0819  BP:  105/68  (!) 97/59  Pulse:  70  70  Resp:  20    Temp:  97.8 F (36.6 C)    TempSrc:  Oral    SpO2:  97%    Weight: 64.1 kg  64.1 kg   Height:        Intake/Output Summary (Last 24 hours) at 01/11/2021 1102 Last data filed at 01/11/2021 0820 Gross per 24 hour  Intake 580 ml  Output 1725 ml  Net -1145 ml   Last 3 Weights 01/11/2021 01/11/2021 01/10/2021  Weight (lbs) 141 lb 6.4 oz 141 lb 6.4 oz 144 lb 8 oz  Weight (kg) 64.139 kg 64.139 kg 65.545 kg      Telemetry  Overnight telemetry shows V paced rhythm, which I personally reviewed.   Physical Exam   Vitals:   01/11/21 0034 01/11/21 0353 01/11/21 0500 01/11/21 0819  BP:  105/68  (!) 97/59  Pulse:  70  70  Resp:  20    Temp:  97.8 F (36.6 C)    TempSrc:  Oral    SpO2:  97%    Weight: 64.1 kg  64.1 kg   Height:         Intake/Output Summary (Last 24 hours) at 01/11/2021 1102 Last data filed at 01/11/2021 0820 Gross per 24 hour  Intake  580 ml  Output 1725 ml  Net -1145 ml    Last 3 Weights 01/11/2021 01/11/2021 01/10/2021  Weight (lbs) 141 lb 6.4 oz 141 lb 6.4 oz 144 lb 8 oz  Weight (kg) 64.139 kg 64.139 kg 65.545 kg    Body mass index is 24.27 kg/m.   General: Well nourished, well developed, in no acute distress Head: Atraumatic, normal size  Eyes: PEERLA, EOMI  Neck: Supple, JVD 8 to 10 cm of water Endocrine: No thryomegaly Cardiac: Normal S1, S2; RRR; 3 out of 6 holosystolic murmur Lungs: Crackles at the lung bases Abd: Soft, nontender, no hepatomegaly  Ext: 1+ pitting edema Musculoskeletal: No deformities, BUE and BLE strength normal and equal Skin: Warm and dry, no rashes   Neuro: Alert and oriented to person, place, time, and situation, CNII-XII grossly intact, no focal deficits  Psych: Normal mood and affect   Labs  High Sensitivity Troponin:  No results for input(s): TROPONINIHS in the last 720 hours.   Cardiac EnzymesNo results for input(s): TROPONINI in the last 168 hours. No results for input(s): TROPIPOC in  the last 168 hours.  Chemistry Recent Labs  Lab 01/08/21 2313 01/09/21 0422 01/10/21 0342 01/11/21 0437  NA 132* 131* 131* 131*  K 4.9 4.7 4.6 3.3*  CL 96* 96* 94* 92*  CO2 _0 GLUCOSE 101* 88 97 89  BUN 65* 62* 67* 71*  CREATININE 2.55* 2.53* 2.92* 2.81*  CALCIUM 9.8 9.7 9.4 9.5  PROT 6.6  --   --   --   ALBUMIN 3.2*  --   --   --   AST 45*  --   --   --   ALT 36  --   --   --   ALKPHOS 120  --   --   --   BILITOT 2.5*  --   --   --   GFRNONAA 18* 18* 15* 16*  ANIONGAP _1 Hematology Recent Labs  Lab 01/11/21 0437  WBC 6.7  RBC 3.35*  HGB 9.5*  HCT 31.2*  MCV 93.1  MCH 28.4  MCHC 30.4  RDW 18.1*  PLT 163   BNP Recent Labs  Lab 01/08/21 1055 01/08/21 2313  BNP  --  539.1*  PROBNP 1,494*  --     DDimer No results for input(s): DDIMER in the last 168 hours.   Radiology  US RENAL  Result Date: 01/10/2021 CLINICAL DATA:  Acute renal insufficiency  EXAM: RENAL / URINARY TRACT ULTRASOUND COMPLETE COMPARISON:  08/28/2006 FINDINGS: Right Kidney: Renal measurements: 10.1 x 4.5 x 4.4 cm = volume: 103.9 mL. Echogenicity within normal limits. Simple cyst upper pole right kidney measuring 1 cm. No hydronephrosis or nephrolithiasis. Left Kidney: Renal measurements: 10.6 x 3.9 by 4.0 cm = volume: 85.7 mL. Echogenicity within normal limits. No hydronephrosis or nephrolithiasis. Multiple left renal cysts, largest exophytic off the lower pole measuring approximately 5.1 cm. Bladder: Appears normal for degree of bladder distention. Other: None. IMPRESSION: 1. Bilateral simple renal cysts. Otherwise unremarkable renal ultrasound. Electronically Signed   By: Randa Ngo M.D.   On: 01/10/2021 16:55   ECHOCARDIOGRAM LIMITED  Result Date: 01/09/2021    ECHOCARDIOGRAM REPORT   Patient Name:   [name] Date of Exam: 01/09/2021 Medical Rec #:  [id number]      Height:       64.0 in Accession #:    [id number]     Weight:       144.4 lb Date of Birth:  [birth year]      BSA:          1.703 m Patient Age:    87 years       BP:           103/65 mmHg Patient Gender: F              HR:           71 bpm. Exam Location:  Inpatient Procedure: Limited Color Doppler, Color Doppler and Cardiac Doppler Indications:    acute systolic chf  History:        Patient has prior history of Echocardiogram examinations, most                 recent 11/15/2020. Pacemaker, chronic kidney disease, Mitral Valve                 Disease, Arrythmias:sick sinus syndrome and Atrial Fibrillation;                 Risk Factors:Hypertension.  Sonographer:  Johny Chess Referring Phys: [id number] [place]  1. Left ventricular ejection fraction, by estimation, is 35 to 40%. The left ventricle has moderately decreased function. The left ventricle demonstrates global hypokinesis with septal-lateral dyssynchrony. Left ventricular diastolic parameters are consistent with Grade II diastolic  dysfunction (pseudonormalization).  2. Right ventricular systolic function is moderately reduced. The right ventricular size is moderately enlarged. There is mildly elevated pulmonary artery systolic pressure, 42 mmHg. D-shaped interventricular septum suggestive of RV pressure/volume overload.  3. Left atrial size was moderately dilated.  4. Right atrial size was moderately dilated.  5. The mitral valve is degenerative. Severe mitral valve regurgitation. Mitral regurgitation is eccentric and posteriorly-directed with anterior leaflet prolapse and thickening. No evidence of mitral stenosis. Moderate mitral annular calcification.  6. The tricuspid valve is abnormal. Tricuspid valve regurgitation is moderate to severe.  7. The aortic valve is tricuspid. Aortic valve regurgitation is not visualized. Mild to moderate aortic valve sclerosis/calcification is present, without any evidence of aortic stenosis. The right coronary cusp appears fixed.  8. The inferior vena cava is dilated in size with <50% respiratory variability, suggesting right atrial pressure of 15 mmHg.  9. Left pleural effusion noted. FINDINGS  Left Ventricle: Left ventricular ejection fraction, by estimation, is 35 to 40%. The left ventricle has moderately decreased function. The left ventricle demonstrates global hypokinesis. The left ventricular internal cavity size was normal in size. There is no left ventricular hypertrophy. Left ventricular diastolic parameters are consistent with Grade II diastolic dysfunction (pseudonormalization). Right Ventricle: The right ventricular size is moderately enlarged. No increase in right ventricular wall thickness. Right ventricular systolic function is moderately reduced. There is mildly elevated pulmonary artery systolic pressure. The tricuspid regurgitant velocity is 2.58 m/s, and with an assumed right atrial pressure of 15 mmHg, the estimated right ventricular systolic pressure is 53.2 mmHg. Left Atrium: Left  atrial size was moderately dilated. Right Atrium: Right atrial size was moderately dilated. Pericardium: Left pleural effusion noted. Trivial pericardial effusion is present. Mitral Valve: The mitral valve is degenerative in appearance. There is mild calcification of the mitral valve leaflet(s). Moderate mitral annular calcification. Severe mitral valve regurgitation. No evidence of mitral valve stenosis. Tricuspid Valve: The tricuspid valve is abnormal. Tricuspid valve regurgitation is moderate to severe. Aortic Valve: The aortic valve is tricuspid. Aortic valve regurgitation is not visualized. Mild to moderate aortic valve sclerosis/calcification is present, without any evidence of aortic stenosis. Pulmonic Valve: The pulmonic valve was normal in structure. Pulmonic valve regurgitation is trivial. Aorta: The aortic root is normal in size and structure. Venous: The inferior vena cava is dilated in size with less than 50% respiratory variability, suggesting right atrial pressure of 15 mmHg. IAS/Shunts: No atrial level shunt detected by color flow Doppler. Additional Comments: A device lead is visualized in the right ventricle.  LEFT VENTRICLE PLAX 2D LVIDd:         4.60 cm LVIDs:         3.50 cm LV PW:         1.00 cm LV IVS:        0.90 cm LVOT diam:     1.90 cm LV SV:         32 LV SV Index:   19 LVOT Area:     2.84 cm  RIGHT VENTRICLE            IVC RV S prime:     9.25 cm/s  IVC diam: 2.40 cm TAPSE (M-mode): 1.3  cm LEFT ATRIUM         Index LA diam:    4.90 cm 2.88 cm/m  AORTIC VALVE LVOT Vmax:   62.90 cm/s LVOT Vmean:  39.500 cm/s LVOT VTI:    0.113 m MITRAL VALVE                 TRICUSPID VALVE MV Area (PHT): 3.91 cm      TR Peak grad:   26.6 mmHg MV Decel Time: 194 msec      TR Vmax:        258.00 cm/s MR Peak grad:    103.2 mmHg MR Mean grad:    62.0 mmHg   SHUNTS MR Vmax:         508.00 cm/s Systemic VTI:  0.11 m MR Vmean:        365.0 cm/s  Systemic Diam: 1.90 cm MR PISA:         3.08 cm MR PISA Eff  ROA: 24 mm MR PISA Radius:  0.70 cm MV E velocity: 123.00 cm/s MV A velocity: 61.60 cm/s MV E/A ratio:  2.00 Loralie Champagne MD Electronically signed by Loralie Champagne MD Signature Date/Time: 01/09/2021/4:29:57 PM    Final     Cardiac Studies  TTE 01/09/2021 1. Left ventricular ejection fraction, by estimation, is 35 to 40%. The  left ventricle has moderately decreased function. The left ventricle  demonstrates global hypokinesis with septal-lateral dyssynchrony. Left  ventricular diastolic parameters are  consistent with Grade II diastolic dysfunction (pseudonormalization).  2. Right ventricular systolic function is moderately reduced. The right  ventricular size is moderately enlarged. There is mildly elevated  pulmonary artery systolic pressure, 42 mmHg. D-shaped interventricular  septum suggestive of RV pressure/volume  overload.  3. Left atrial size was moderately dilated.  4. Right atrial size was moderately dilated.  5. The mitral valve is degenerative. Severe mitral valve regurgitation.  Mitral regurgitation is eccentric and posteriorly-directed with anterior  leaflet prolapse and thickening. No evidence of mitral stenosis. Moderate  mitral annular calcification.  6. The tricuspid valve is abnormal. Tricuspid valve regurgitation is  moderate to severe.  7. The aortic valve is tricuspid. Aortic valve regurgitation is not  visualized. Mild to moderate aortic valve sclerosis/calcification is  present, without any evidence of aortic stenosis. The right coronary cusp  appears fixed.  8. The inferior vena cava is dilated in size with <50% respiratory  variability, suggesting right atrial pressure of 15 mmHg.  9. Left pleural effusion noted.  Patient Profile  Mary Keitt Tuckeris a 87 y.o.femalewith nonischemic cardiomyopathy secondary to RV pacing with upgrade to CRT-P, persistent atrial fibrillation on Xarelto, high-grade AV block status post pacemaker, hypothyroidism, CKD stage  IV who was admitted on 01/08/2021 for acute decompensated heart failure.   Assessment & Plan   1.  Acute decompensated systolic heart failure, EF 35-40%/right-sided heart failure, cor pulmonale/severe mitral regurgitation/severe tricuspid regurgitation -Admitted with volume overload.  EF is reduced now to 35 to 40%.  I suspect her decompensation is now a flail segment of her mitral valve leaflet.  She has severe eccentric MR. -She has severe tricuspid regurgitation and RV failure. -Still volume up. -We had an extensive discussion yesterday with her and her daughter.  We also met with palliative care.  She really has limited options for her valvular heart disease.  Given her CKD she is high risk to end up on dialysis.  I do not believe she would be a good candidate for MitraClip.  I think any contrast load would result in her being on dialysis.  She expressed that she does not want to be on dialysis. -She is now DNR/DNI.  She does not want aggressive measures. -I think the best option for now is medical management. -She has responded well to Lasix with Zaroxolyn.  We will continue 100 mg IV twice daily today with 10 mg of Zaroxolyn.  Likely transition to oral diuretics at home tomorrow. -She needs a PT evaluation.  This is been ordered. -She is net -1.1 L.  Kidney function has precluded aggressive diuresis.  Hopefully this will pick up. -Regarding her cardiomyopathy, aggressive guideline directed medical therapy is limited.  She is on metoprolol succinate 25 mg daily.  Pressures are marginal.  She is not a candidate for ACE/ARB/Arni given low blood pressure.  Also with significant CKD stage IV.  I really do not think she would tolerate hydralazine and Imdur.  Really unclear how much she would benefit from aggressive care anyway.  2.  Severe tricuspid regurgitation/severe mitral regurgitation -Known severe TR.  MR is worsened.  Suspect she has a flail segment now.  Given her advanced age and CKD stage  IV she really has limited options. -I recommended a palliative approach.  Family is in agreement.  3.  Permanent atrial fibrillation status post CRT-P -She is now persistently in A. fib.  We will continue metoprolol.  Stop her amiodarone. -Given significant decline in kidney function I think Eliquis is a better option.  We will transition to 2.5 mg twice daily.  4.  AKI on CKD stage IV -Renal ultrasound negative for obstruction.  UA negative for infection. -I suspect her creatinine has slightly risen due to diuresis.  She does need volume off.  Creatinine is coming down.  Diuresis as above. -Transition Eliquis as above. -Avoid nephrotoxic agents.  FEN -No intravenous fluids -Diet: Salt restricted -Code: DNR/DNI -DVT PPx: Eliquis  For questions or updates, please contact [place] Please consult [email or web address] for contact info under   Time Spent with Patient: I have spent a total of 25 minutes with patient reviewing hospital notes, telemetry, EKGs, labs and examining the patient as well as establishing an assessment and plan that was discussed with the patient.  > 50% of time was spent in direct patient care.    Signed, Addison Naegeli. Audie Box, [place]  01/11/2021 11:02 AM

## 2021-01-11 NOTE — Progress Notes (Signed)
RN noticed scant bloody tissues and scant bloody phlegm when patient's is coughing up. Callie, [place] made aware and pharmacist made aware. Callie, PA ordered CBC lab and saline spray requested.

## 2021-01-11 NOTE — Progress Notes (Signed)
Daily Progress Note   [name]       Date: 01/11/2021 DOB: 1929-12-30  Age: 87 y.o. MRN#: [id number] Attending Physician: Mary Bergeron, MD Primary Care Physician: Mary Noon, MD Admit Date: 01/08/2021  Reason for Consultation/Follow-up: Establishing goals of care  Subjective: Patient tells me of more urine output. Feels good. Ready to go home.   Length of Stay: 3  Current Medications: Scheduled Meds:  . ALPRAZolam  0.5 mg Oral QHS  . apixaban  2.5 mg Oral BID  . busPIRone  5 mg Oral BID  . metoprolol succinate  25 mg Oral Daily  . sodium chloride flush  3 mL Intravenous Q12H    Continuous Infusions: . sodium chloride    . furosemide 100 mg (01/11/21 1141)    PRN Meds: sodium chloride, acetaminophen, guaiFENesin-dextromethorphan, ondansetron (ZOFRAN) IV, sodium chloride, sodium chloride flush  Physical Exam Constitutional:      General: She is not in acute distress.    Comments: HOH  Pulmonary:     Effort: Pulmonary effort is normal. No respiratory distress.  Skin:    General: Skin is warm and dry.  Neurological:     Mental Status: She is alert and oriented to person, place, and time.             Vital Signs: BP 95/65   Pulse 70   Temp 98 F (36.7 C) (Oral)   Resp 18   Ht 5\' 4"  (1.626 m)   Wt 64.1 kg   SpO2 97%   BMI 24.27 kg/m  SpO2: SpO2: 97 % O2 Device: O2 Device: Room Air O2 Flow Rate: O2 Flow Rate (L/min): 2 L/min  Intake/output summary:   Intake/Output Summary (Last 24 hours) at 01/11/2021 1318 Last data filed at 01/11/2021 1117 Gross per 24 hour  Intake 580 ml  Output 1975 ml  Net -1395 ml   LBM: Last BM Date: 01/10/21 Baseline Weight: Weight: 65.9 kg (scale c) Most recent weight: Weight: 64.1 kg       Palliative  Assessment/Data: PPS 50%    Flowsheet Rows   Flowsheet Row Most Recent Value  Intake Tab   Referral Department Cardiology  Unit at Time of Referral Cardiac/Telemetry Unit  Palliative Care Primary Diagnosis Cardiac  Date Notified 01/10/21  Palliative Care Type New Palliative care  Reason for referral Clarify Goals of Care  Date of Admission 01/08/21  Date first seen by Palliative Care 01/10/21  # of days Palliative referral response time 0 Day(s)  # of days IP prior to Palliative referral 2  Clinical Assessment   Palliative Performance Scale Score 50%  Psychosocial & Spiritual Assessment   Palliative Care Outcomes   Patient/Family meeting held? Yes  Who was at the meeting? daughter sister patient  Palliative Care Outcomes Clarified goals of care      Patient Active Problem List   Diagnosis Date Noted  . Mitral regurgitation 01/09/2021  . Acute on chronic heart failure ([place]) 01/08/2021  . Secondary hypercoagulable state ([place]) 12/17/2020  . Pressure injury of skin 11/18/2020  . Dyspnea 11/08/2020  . Acute on chronic combined systolic and diastolic CHF (congestive heart failure) ([place]) 11/06/2020  . CKD (  chronic kidney disease) stage 3, GFR 30-59 ml/min (HCC) 11/06/2020  . AKI (acute kidney injury) ([place]) 11/06/2020  . Orthostatic lightheadedness 08/06/2020  . Tricuspid regurgitation 05/2020  . Persistent atrial fibrillation ([place])   . Acute systolic heart failure ([place]) 03/26/2020  . Biventricular cardiac pacemaker in situ 01/23/2020  . Acute respiratory failure with hypoxia ([place]) 01/20/2019  . Paroxysmal atrial fibrillation (HCC)   . Sick sinus syndrome ([place]) 01/05/2017  . Overweight (BMI 25.0-29.9) 12/19/2015  . S/P left THA, AA 12/18/2015  . Chronic venous insufficiency 11/16/2015  . Varicose veins of both lower extremities 11/16/2015  . Edema 05/20/2012  . Bradycardia   . Hypertension   . Hyperlipidemia     Palliative Care Assessment & Plan   HPI: 87 y.o. female   with past medical history of NICM, persistent a fib, SSS w PPM, and hypothyroidism admitted on 01/08/2021 with acute decompensated heart failure. Admitted with volume overload. Course complicated by AKI. PMT consulted for [place] discussion.   Assessment: Follow up with patient and daughter. They are encouraged about some improvement in kidney function. Patient reports feeling ready to go home.    We discussed patient's current illness and what it means in the larger context of patient's on-going co-morbidities. We discuss chronic/incurable nature of her diseases - discussed managing them with medications.   The difference between aggressive medical intervention and comfort care was considered in light of the patient's goals of care. We discussed returning home with previously established care aid and an outpatient palliative consult vs home with hospice. Daughter does not feel that they are ready for hospice. Would prefer to return home with previous care and palliative referral.    I completed a MOST form today. The patient and family outlined their wishes for the following treatment decisions:  Cardiopulmonary Resuscitation: Do Not Attempt Resuscitation (DNR/No CPR)  Medical Interventions: Limited Additional Interventions: Use medical treatment, IV fluids and cardiac monitoring as indicated, DO NOT USE intubation or mechanical ventilation. May consider use of less invasive airway support such as BiPAP or CPAP. Also provide comfort measures. Transfer to the hospital if indicated. Avoid intensive care.   Antibiotics: Antibiotics if indicated  IV Fluids: IV fluids if indicated  Feeding Tube: No feeding tube    Discussed with patient/family the importance of continued conversation with family and the medical providers regarding overall plan of care and treatment options, ensuring decisions are within the context of the patient's values and GOCs.    Questions and concerns were addressed. The family was  encouraged to call with questions or concerns.   Recommendations/Plan:  Referral to outpatient palliative - family not interested in hospice services at this time  MOST completed  DNR, NO HD  Goals of Care and Additional Recommendations:  Limitations on Scope of Treatment: No Hemodialysis  Code Status:  DNR  Discharge Planning:  Home with [place] was discussed with patient, daughter [name]  Thank you for allowing the Palliative Medicine Team to assist in the care of this patient.   Total Time 40 minutes Prolonged Time Billed  no       Greater than 50%  of this time was spent counseling and coordinating care related to the above assessment and plan.  Juel Burrow, DNP, [facility] Palliative Medicine Team Team Phone # [phone]  Pager [phone]

## 2021-01-11 NOTE — Plan of Care (Signed)
  Problem: Education: Goal: Knowledge of General Education information will improve Description: Including pain rating scale, medication(s)/side effects and non-pharmacologic comfort measures Outcome: Progressing   Problem: Clinical Measurements: Goal: Respiratory complications will improve Outcome: Progressing   Problem: Clinical Measurements: Goal: Cardiovascular complication will be avoided Outcome: Progressing   Problem: Activity: Goal: Risk for activity intolerance will decrease Outcome: Progressing   

## 2021-01-11 NOTE — Progress Notes (Signed)
Patient is usung O2 via nasal cannula (2L) PRN. O2 sats remain WNL on RA, but if patient gets up she feels a little SHOB. Patient is also coughing, has a runny nose, and voice is getting hoarse. Patient states she received cough syrup the night before. RN also gave PRN cough syrup tonight, VSS, will continue to monitor.

## 2021-01-11 NOTE — Care Management (Addendum)
1336 01-11-21 Case Manager received a secure chat from the Palliative NP to offer outpatient Palliative Services. Case Manager called the patient and daughter answered the phone- offered palliative services through [facility] and daughter wanted to discuss plan with her sister. No outpatient palliative established at this time. Case Manager provided daughter with contact number for her to call with choice. Patient is currently active with [facility] for [place] will need resumption orders and F2F. Case Manager will continue to follow. Bethena Roys, RN,BSN Case Manager    1438 01-11-21 Case Manager received a call from daughter [name] and she discussed plan with sister Butch Penny- family is agreeable to palliative services. [name] asked Case Manager to call [facility] for outpatient palliative services. Case Manager made the referral with Liaison Manus Gunning and she will call Butch Penny with a visit time at [phone]. No further needs at this time. Graves-Bigelow, Ocie Cornfield, RN, BSN Case Manager

## 2021-01-12 DIAGNOSIS — I5033 Acute on chronic diastolic (congestive) heart failure: Secondary | ICD-10-CM

## 2021-01-12 LAB — BASIC METABOLIC PANEL
Anion gap: 11 (ref 5–15)
BUN: 70 mg/dL — ABNORMAL HIGH (ref 8–23)
CO2: 30 mmol/L (ref 22–32)
Calcium: 9.5 mg/dL (ref 8.9–10.3)
Chloride: 92 mmol/L — ABNORMAL LOW (ref 98–111)
Creatinine, Ser: 2.93 mg/dL — ABNORMAL HIGH (ref 0.44–1.00)
GFR, Estimated: 15 mL/min — ABNORMAL LOW (ref >60)
Glucose, Bld: 91 mg/dL (ref 70–99)
Potassium: 3.1 mmol/L — ABNORMAL LOW (ref 3.5–5.1)
Sodium: 133 mmol/L — ABNORMAL LOW (ref 135–145)

## 2021-01-12 MED ORDER — APIXABAN 2.5 MG PO TABS: 2.5000 mg | ORAL_TABLET | Freq: Two times a day (BID) | ORAL | 6 refills | Status: AC

## 2021-01-12 MED ORDER — METOPROLOL SUCCINATE ER 25 MG PO TB24
25.0000 mg | ORAL_TABLET | Freq: Every day | ORAL | 6 refills | Status: AC
Start: 2021-01-13 — End: ?

## 2021-01-12 MED ORDER — TORSEMIDE 10 MG PO TABS: 20.0000 mg | ORAL_TABLET | Freq: Every day | ORAL | 3 refills | Status: AC

## 2021-01-12 MED ORDER — POTASSIUM CHLORIDE CRYS ER 20 MEQ PO TBCR
60.0000 meq | EXTENDED_RELEASE_TABLET | Freq: Once | ORAL | Status: AC
  Administered 2021-01-12: 60 meq via ORAL
  Filled 2021-01-12: qty 3

## 2021-01-12 NOTE — Progress Notes (Signed)
Progress Note  [name] Date of Encounter: 01/12/2021 [facility] Cardiologist: Virl Axe, MD   Subjective   Feeling better.  Ready to go home.  Palliative care.  Inpatient Medications    Scheduled Meds: . ALPRAZolam  0.5 mg Oral QHS  . apixaban  2.5 mg Oral BID  . busPIRone  5 mg Oral BID  . metoprolol succinate  25 mg Oral Daily  . sodium chloride flush  3 mL Intravenous Q12H   Continuous Infusions: . sodium chloride     PRN Meds: sodium chloride, acetaminophen, guaiFENesin-dextromethorphan, ondansetron (ZOFRAN) IV, sodium chloride, sodium chloride flush   Vital Signs    Vitals:   01/11/21 1125 01/11/21 1130 01/11/21 2025 01/12/21 0359  BP: 96/63 95/65 97/61  99/61  Pulse: 70 70 70 70  Resp:   16 18  Temp:   (!) 97.4 F (36.3 C) (!) 97.4 F (36.3 C)  TempSrc:   Oral Oral  SpO2: 97% 97% 98% 93%  Weight:    61.9 kg  Height:        Intake/Output Summary (Last 24 hours) at 01/12/2021 0854 Last data filed at 01/11/2021 2000 Gross per 24 hour  Intake 300 ml  Output 800 ml  Net -500 ml   Last 3 Weights 01/12/2021 01/11/2021 01/11/2021  Weight (lbs) 136 lb 8 oz 141 lb 6.4 oz 141 lb 6.4 oz  Weight (kg) 61.916 kg 64.139 kg 64.139 kg      Telemetry    Ventricular paced rhythm- Personally Reviewed  ECG    Ventricular pacing- Personally Reviewed  Physical Exam   GEN: No acute distress.   Neck: No JVD Cardiac: RRR, 3/6 holosystolic murmur Respiratory: Clear to auscultation bilaterally. GI: Soft, nontender, non-distended  MS:  1+ lower extremity edema; No deformity. Neuro:  Nonfocal  Psych: Normal affect   Labs    High Sensitivity Troponin:  No results for input(s): TROPONINIHS in the last 720 hours.    Chemistry Recent Labs  Lab 01/08/21 2313 01/09/21 0422 01/10/21 0342 01/11/21 0437 01/12/21 0442  NA 132*   < > 131* 131* 133*  K 4.9   < > 4.6 3.3* 3.1*  CL 96*   < > 94* 92* 92*  CO2 24   < > 26 25 30   GLUCOSE 101*   < > 97  89 91  BUN 65*   < > 67* 71* 70*  CREATININE 2.55*   < > 2.92* 2.81* 2.93*  CALCIUM 9.8   < > 9.4 9.5 9.5  PROT 6.6  --   --   --   --   ALBUMIN 3.2*  --   --   --   --   AST 45*  --   --   --   --   ALT 36  --   --   --   --   ALKPHOS 120  --   --   --   --   BILITOT 2.5*  --   --   --   --   GFRNONAA 18*   < > 15* 16* 15*  ANIONGAP 12   < > 11 14 11    < > = values in this interval not displayed.     Hematology Recent Labs  Lab 01/11/21 0437  WBC 6.7  RBC 3.35*  HGB 9.5*  HCT 31.2*  MCV 93.1  MCH 28.4  MCHC 30.4  RDW 18.1*  PLT 163    BNP Recent Labs  Lab 01/08/21 1055 01/08/21 2313  BNP  --  539.1*  PROBNP 1,494*  --      DDimer No results for input(s): DDIMER in the last 168 hours.   Radiology    US RENAL  Result Date: 01/10/2021 CLINICAL DATA:  Acute renal insufficiency EXAM: RENAL / URINARY TRACT ULTRASOUND COMPLETE COMPARISON:  08/28/2006 FINDINGS: Right Kidney: Renal measurements: 10.1 x 4.5 x 4.4 cm = volume: 103.9 mL. Echogenicity within normal limits. Simple cyst upper pole right kidney measuring 1 cm. No hydronephrosis or nephrolithiasis. Left Kidney: Renal measurements: 10.6 x 3.9 by 4.0 cm = volume: 85.7 mL. Echogenicity within normal limits. No hydronephrosis or nephrolithiasis. Multiple left renal cysts, largest exophytic off the lower pole measuring approximately 5.1 cm. Bladder: Appears normal for degree of bladder distention. Other: None. IMPRESSION: 1. Bilateral simple renal cysts. Otherwise unremarkable renal ultrasound. Electronically Signed   By: Randa Ngo M.D.   On: 01/10/2021 16:55    Cardiac Studies   TTE 01/09/2021 1. Left ventricular ejection fraction, by estimation, is 35 to 40%. The  left ventricle has moderately decreased function. The left ventricle  demonstrates global hypokinesis with septal-lateral dyssynchrony. Left  ventricular diastolic parameters are  consistent with Grade II diastolic dysfunction (pseudonormalization).   2. Right ventricular systolic function is moderately reduced. The right  ventricular size is moderately enlarged. There is mildly elevated  pulmonary artery systolic pressure, 42 mmHg. D-shaped interventricular  septum suggestive of RV pressure/volume  overload.  3. Left atrial size was moderately dilated.  4. Right atrial size was moderately dilated.  5. The mitral valve is degenerative. Severe mitral valve regurgitation.  Mitral regurgitation is eccentric and posteriorly-directed with anterior  leaflet prolapse and thickening. No evidence of mitral stenosis. Moderate  mitral annular calcification.  6. The tricuspid valve is abnormal. Tricuspid valve regurgitation is  moderate to severe.  7. The aortic valve is tricuspid. Aortic valve regurgitation is not  visualized. Mild to moderate aortic valve sclerosis/calcification is  present, without any evidence of aortic stenosis. The right coronary cusp  appears fixed.  8. The inferior vena cava is dilated in size with <50% respiratory  variability, suggesting right atrial pressure of 15 mmHg.  9. Left pleural effusion noted.  Patient Profile     87 y.o. female admitted with acute decompensated systolic heart failure, nonischemic cardiomyopathy, RV pacing, persistent atrial fibrillation, chronic kidney disease stage IV with severe mitral regurgitation, severe tricuspid regurgitation  Assessment & Plan    Acute systolic heart failure/EF 35 to 40% with right-sided heart failure cor pulmonale severe mitral vegetation and severe tricuspid regurgitation -EF is now reduced to 35 to 40% and likely related to flail segment of mitral valve.  Has severe eccentric MR.  She also has severe tricuspid regurgitation as well as RV failure. -Palliative care team, Dr. Audie Box has had extensive discussion with family, daughter.  Not a candidate for invasive therapies.  She is comfortable with this.  Continue with palliative measures, medical  management.  DNR/DNI -She responded well to both Lasix and Zaroxolyn.  Sodium, hyponatremic with 133, stable however.  Creatinine 2.93 with BUN of 70, stable over the past 3 days.  Blood pressure has been soft ranging from 02-63 systolic.  Continue with low-dose metoprolol 25 mg.  Not a candidate for ACE inhibitor.  Would not tolerate hydralazine or isosorbide. -We will place on 40 mg of Lasix p.o. twice daily for continued maintenance at home.  Severe tricuspid regurgitation and mitral regurgitation -Suspect flail segment  of mitral valve.  Palliative approach.  Family is in agreement.  Permanent atrial fibrillation status post CRT pacer -Persistent A. fib/permanent A. fib.  Stopping amiodarone. -Given her decline in renal function, Eliquis will be best option.  2.5 mg twice a day.  Watch for any signs of bleeding.  Chronic knee disease stage IV -Avoid other nephrotoxic agents.  Physical therapy is currently assessing.  She would like to go home.  She does state that she has help at home.  Utilizes a walker.   For questions or updates, please contact [place] Please consult [email or web address] for contact info under        Signed, Candee Furbish, MD  01/12/2021, 8:54 AM

## 2021-01-12 NOTE — Evaluation (Signed)
Physical Therapy Evaluation Patient Details Name: [name] MRN: [id number] DOB: 08/05/1930 Today's Date: 01/12/2021   History of Present Illness  The pt is an 87 yo female presenting 3/2 due to progressive volume overload. Pt found to have acute on chronic decompensated HF complicated by AKI. PMH includes: anxiety, aortic valve sclerosis, afib, CHF, GERD, HOH, HLD, HTN, OA, obesity, and renal insufficiency.    Clinical Impression  Pt in bed upon arrival of PT, agreeable to evaluation at this time. Prior to admission the pt was mobilizing with use of rollator in her home, living with her husband who requires assist of an aide 24/7. The pt now presents with limitations in functional mobility, endurance, strength, power, stability, and safety awareness due to above dx, and will continue to benefit from skilled PT to address these deficits. The pt was able to demo good mobility in bed and good stability with initial transfers, but requires supervision for all mobility to provide safety cues with mobility. The pt also completed multiple sit-stands from EOB and recliner as well as ambulation in the room with RW and cues for positioning in RW/safety. The pt is eager to return home and states they have arranged for aide 24/7. I feel the pt is safe to return home with supervision from aide or family member other than her husband to provide assistance with IADLs or cues for safety with mobility. The pt will continue to benefit from skilled PT acutely and in home setting to maximize independence and safety with mobility.      Follow Up Recommendations Home health PT;Supervision for mobility/OOB    Equipment Recommendations  Rolling walker with 5" wheels    Recommendations for Other Services       Precautions / Restrictions Precautions Precautions: Fall Restrictions Weight Bearing Restrictions: No      Mobility  Bed Mobility Overal bed mobility: Modified Independent             General  bed mobility comments: increased time, use of bed rails but no assist    Transfers Overall transfer level: Needs assistance Equipment used: Rolling walker (2 wheeled);None Transfers: Sit to/from Stand Sit to Stand: Min guard         General transfer comment: minG from recliner and EOB with heavy VC for hand positioning but no assist with use of RW or no AD  Ambulation/Gait Ambulation/Gait assistance: Min guard Gait Distance (Feet): 45 Feet Assistive device: Rolling walker (2 wheeled) Gait Pattern/deviations: Step-through pattern;Decreased stride length Gait velocity: decreased Gait velocity interpretation: <1.31 ft/sec, indicative of household ambulator General Gait Details: pt with small BOS and short steps. no LOB noted but slowed movements. Benefits from supervision for safe management of RW     Balance Overall balance assessment: Needs assistance Sitting-balance support: No upper extremity supported;Feet supported Sitting balance-Leahy Scale: Good     Standing balance support: No upper extremity supported Standing balance-Leahy Scale: Fair Standing balance comment: UE support for mobility, able to static stand without UE support                             Pertinent Vitals/Pain Pain Assessment: No/denies pain    Home Living Family/patient expects to be discharged to:: Private residence Living Arrangements: Spouse/significant other Available Help at Discharge: Family;Friend(s);Available PRN/intermittently (daughters nearby) Type of Home: House Home Access: Level entry (small threshold)     Home Layout: One level Home Equipment: Walker - 4 wheels;Shower seat;Bedside commode;Grab  bars - tub/shower;Grab bars - toilet      Prior Function Level of Independence: Independent with assistive device(s)         Comments: independent with rollator, reports aide for husband 24/7 so she does not have to provide assist. Pt was cooking and cleaning, driving. can  arrange assist for these at d/c.     Hand Dominance   Dominant Hand: Right    Extremity/Trunk Assessment   Upper Extremity Assessment Upper Extremity Assessment: Generalized weakness    Lower Extremity Assessment Lower Extremity Assessment: Generalized weakness    Cervical / Trunk Assessment Cervical / Trunk Assessment: Normal  Communication   Communication: HOH  Cognition Arousal/Alertness: Awake/alert Behavior During Therapy: WFL for tasks assessed/performed Overall Cognitive Status: No family/caregiver present to determine baseline cognitive functioning Area of Impairment: Memory;Safety/judgement;Problem solving                     Memory: Decreased short-term memory;Decreased recall of precautions   Safety/Judgement: Decreased awareness of safety   Problem Solving: Slow processing;Difficulty sequencing;Requires verbal cues General Comments: pt requiring repeated cues all through session. verbalizes understanding of precautions for reduced fall risk, but unable to implement without cues during mobility.      General Comments General comments (skin integrity, edema, etc.): VSS on RA. SpO2 95-100% on RA with mobility.    Exercises General Exercises - Lower Extremity Hip Flexion/Marching: AROM;Both;10 reps;Standing Heel Raises: AROM;Both;10 reps;Standing   Assessment/Plan    PT Assessment Patient needs continued PT services  PT Problem List Decreased strength;Decreased balance;Decreased activity tolerance;Decreased mobility;Decreased coordination;Decreased cognition;Decreased knowledge of use of DME;Decreased safety awareness       PT Treatment Interventions DME instruction;Gait training;Stair training;Functional mobility training;Therapeutic activities;Therapeutic exercise;Balance training;Cognitive remediation;Patient/family education    PT Goals (Current goals can be found in the Care Plan section)  Acute Rehab PT Goals Patient Stated Goal: return home  and avoid falling PT Goal Formulation: With patient Time For Goal Achievement: 01/26/21 Potential to Achieve Goals: Good    Frequency Min 3X/week    AM-PAC PT "6 Clicks" Mobility  Outcome Measure Help needed turning from your back to your side while in a flat bed without using bedrails?: None Help needed moving from lying on your back to sitting on the side of a flat bed without using bedrails?: A Little Help needed moving to and from a bed to a chair (including a wheelchair)?: A Little Help needed standing up from a chair using your arms (e.g., wheelchair or bedside chair)?: A Little Help needed to walk in hospital room?: A Little Help needed climbing 3-5 steps with a railing? : A Little 6 Click Score: 19    End of Session Equipment Utilized During Treatment: Gait belt Activity Tolerance: Patient tolerated treatment well Patient left: in chair;with call bell/phone within reach;with chair alarm set;with nursing/sitter in room Nurse Communication: Mobility status PT Visit Diagnosis: Other abnormalities of gait and mobility (R26.89);Muscle weakness (generalized) (M62.81)    Time: 2035-5974 PT Time Calculation (min) (ACUTE ONLY): 31 min   Charges:   PT Evaluation $PT Eval Low Complexity: 1 Low PT Treatments $Gait Training: 8-22 mins        Karma Ganja, PT, DPT   Acute Rehabilitation Department Pager #: [phone]  Otho Bellows 01/12/2021, 10:49 AM

## 2021-01-12 NOTE — Progress Notes (Signed)
Daily Progress Note   [name]       Date: 01/12/2021 DOB: 23-Jan-1930  Age: 87 y.o. MRN#: [id number] Attending Physician: Freada Bergeron, MD Primary Care Physician: Chesley Noon, MD Admit Date: 01/08/2021  Reason for Consultation/Follow-up: Establishing goals of care  Subjective: Patient tells me of more urine output. Feels good. Ready to go home.   Length of Stay: 4  Current Medications: Scheduled Meds:  . ALPRAZolam  0.5 mg Oral QHS  . apixaban  2.5 mg Oral BID  . busPIRone  5 mg Oral BID  . metoprolol succinate  25 mg Oral Daily  . sodium chloride flush  3 mL Intravenous Q12H    Continuous Infusions: . sodium chloride      PRN Meds: sodium chloride, acetaminophen, guaiFENesin-dextromethorphan, ondansetron (ZOFRAN) IV, sodium chloride, sodium chloride flush  Physical Exam Constitutional:      General: She is not in acute distress.    Comments: HOH Sitting up in chair, pleasant, appears comfortable  Pulmonary:     Effort: Pulmonary effort is normal. No respiratory distress.  Skin:    General: Skin is warm and dry.  Neurological:     Mental Status: She is alert and oriented to person, place, and time.             Vital Signs: BP 98/65 (BP Location: Right Arm)   Pulse 70   Temp (!) 97.4 F (36.3 C) (Oral)   Resp 20   Ht 5\' 4"  (1.626 m)   Wt 61.9 kg   SpO2 100%   BMI 23.43 kg/m  SpO2: SpO2: 100 % O2 Device: O2 Device: Room Air O2 Flow Rate: O2 Flow Rate (L/min): 1 L/min  Intake/output summary:   Intake/Output Summary (Last 24 hours) at 01/12/2021 1036 Last data filed at 01/12/2021 1008 Gross per 24 hour  Intake 400 ml  Output 1250 ml  Net -850 ml   LBM: Last BM Date: 01/11/21 Baseline Weight: Weight: 65.9 kg (scale c) Most recent weight:  Weight: 61.9 kg       Palliative Assessment/Data: PPS 50%    Flowsheet Rows   Flowsheet Row Most Recent Value  Intake Tab   Referral Department Cardiology  Unit at Time of Referral Cardiac/Telemetry Unit  Palliative Care Primary Diagnosis Cardiac  Date Notified 01/10/21  Palliative Care Type New Palliative care  Reason for referral Clarify Goals of Care  Date of Admission 01/08/21  Date first seen by Palliative Care 01/10/21  # of days Palliative referral response time 0 Day(s)  # of days IP prior to Palliative referral 2  Clinical Assessment   Palliative Performance Scale Score 50%  Psychosocial & Spiritual Assessment   Palliative Care Outcomes   Patient/Family meeting held? Yes  Who was at the meeting? daughter sister patient  Palliative Care Outcomes Clarified goals of care      Patient Active Problem List   Diagnosis Date Noted  . Mitral regurgitation 01/09/2021  . Acute on chronic heart failure ([place]) 01/08/2021  . Secondary hypercoagulable state ([place]) 12/17/2020  . Pressure injury of skin 11/18/2020  . Dyspnea 11/08/2020  . Acute on chronic combined systolic and diastolic CHF (congestive heart failure) (  [place]) 11/06/2020  . CKD (chronic kidney disease) stage 3, GFR 30-59 ml/min ([facility]) 11/06/2020  . AKI (acute kidney injury) ([place]) 11/06/2020  . Orthostatic lightheadedness 08/06/2020  . Tricuspid regurgitation 05/2020  . Persistent atrial fibrillation ([place])   . Acute systolic heart failure ([place]) 03/26/2020  . Biventricular cardiac pacemaker in situ 01/23/2020  . Acute respiratory failure with hypoxia ([place]) 01/20/2019  . Paroxysmal atrial fibrillation ([facility])   . Sick sinus syndrome ([place]) 01/05/2017  . Overweight (BMI 25.0-29.9) 12/19/2015  . S/P left THA, AA 12/18/2015  . Chronic venous insufficiency 11/16/2015  . Varicose veins of both lower extremities 11/16/2015  . Edema 05/20/2012  . Bradycardia   . Hypertension   . Hyperlipidemia     Palliative Care  Assessment & Plan   HPI: 87 y.o. female  with past medical history of NICM, persistent a fib, SSS w PPM, and hypothyroidism admitted on 01/08/2021 with acute decompensated heart failure. Admitted with volume overload. Course complicated by AKI. PMT consulted for [place] discussion.   Assessment: Follow up with patient and daughter today. Patient ready to go home. Daughter [name] and I review plans for home with previous amount of support with addition of outpatient palliative - she understands this is different from hospice and does not provide same amount of support. They are not yet ready for support of hospice. They do understand that patient's disease is chronic and not curable.  Goals are clear for continued medical management.  Recommendations/Plan:  Hopeful to dc today - home with previous support and addition of palliative follow up outpatient  MOST completed: DNR, limited medical interventions, no feeding tube.  No HD  Goals of Care and Additional Recommendations:  Limitations on Scope of Treatment: No Hemodialysis  Code Status:  DNR  Discharge Planning:  Home with [place] was discussed with patient, daughter [name]  Thank you for allowing the Palliative Medicine Team to assist in the care of this patient.   Total Time 15 minutes Prolonged Time Billed  no       Greater than 50%  of this time was spent counseling and coordinating care related to the above assessment and plan.  Juel Burrow, DNP, [facility] Palliative Medicine Team Team Phone # [phone]  Pager [phone]

## 2021-01-12 NOTE — Discharge Summary (Addendum)
Discharge Summary    Patient ID: [name] MRN: [id number]; DOB: 12-01-1929  Admit date: 01/08/2021 Discharge date: 01/12/2021  PCP:  Chesley Noon, MD   [place]  Cardiologist:  Virl Axe, MD  Advanced Practice Provider:  No care team member to display Electrophysiologist:  Virl Axe, MD  Discharge Diagnoses    Principal Problem:   Acute on chronic heart failure [facility]) Active Problems:   Hypertension   Sick sinus syndrome ([place])   Persistent atrial fibrillation (HCC)   CKD (chronic kidney disease) stage 3, GFR 30-59 ml/min (HCC)   Tricuspid regurgitation   Mitral regurgitation    Diagnostic Studies/Procedures    Echocardiogram 01/09/21: TTE 01/09/2021 1. Left ventricular ejection fraction, by estimation, is 35 to 40%. The  left ventricle has moderately decreased function. The left ventricle  demonstrates global hypokinesis with septal-lateral dyssynchrony. Left  ventricular diastolic parameters are  consistent with Grade II diastolic dysfunction (pseudonormalization).  2. Right ventricular systolic function is moderately reduced. The right  ventricular size is moderately enlarged. There is mildly elevated  pulmonary artery systolic pressure, 42 mmHg. D-shaped interventricular  septum suggestive of RV pressure/volume  overload.  3. Left atrial size was moderately dilated.  4. Right atrial size was moderately dilated.  5. The mitral valve is degenerative. Severe mitral valve regurgitation.  Mitral regurgitation is eccentric and posteriorly-directed with anterior  leaflet prolapse and thickening. No evidence of mitral stenosis. Moderate  mitral annular calcification.  6. The tricuspid valve is abnormal. Tricuspid valve regurgitation is  moderate to severe.  7. The aortic valve is tricuspid. Aortic valve regurgitation is not  visualized. Mild to moderate aortic valve sclerosis/calcification is  present, without any evidence of  aortic stenosis. The right coronary cusp  appears fixed.  8. The inferior vena cava is dilated in size with <50% respiratory  variability, suggesting right atrial pressure of 15 mmHg.  9. Left pleural effusion noted. _____________   History of Present Illness     [name] is a 87 y.o. female with a PMH of chronic combined CHF, NICM, permanent atrial fibrillation, SSS s/p PPCM with upgrade to CRT pacer 05/2020, severe mitral and tricuspid regurgitation, hyperthyroidism, anemia, and CKD stage IV, who was seen outpatient by Dr. Johney Frame for urgent follow-up 01/08/21.   Patient had a drop in her EF in 02/2020 felt possibly pacemaker induced therefore PPM upgraded to CRT-P in 05/2020 with subsequent recovery in LV function 07/2020.Shepresented to the ED12/28/21 with worsening edema, SOB, DOE, orthopnea. 2D echo 11/07/20 showed EF 50-55%, moderate MR with significant prolapseand echo 11/16/19 showed EF 45-50%. She was diuresed and her amiodarone was increased. She underwent DCCV on 12/06/20, but was back in Afib when seen by Clint Fenton on 12/17/20. Fortunately, she was doing well without symptoms at that time.  On 01/03/21, home health called as patient looked grossly volume overload. Dr. Irish Lack recommended increasing torsemide to 40mg  daily x2 days and then 20mg  daily. She presented to clinic 01/08/21 for close follow-up.   She continued to have LE edema and weight gain despite the increasing torsemide dose. The fluid started to come on last week after feeling unwell for a couple of days. Urine sample taken by [facility] which was negative for UTI, CXR showed volume overload.  Also cooked a couple of meals for herself which we cannot confirm were low sodium. Has not missed doses of medications. Has been sleeping in a recliner due to orthopnea. [facility] did 80mg   IV BID for Friday-Sunday and then she took torsemide 80mg  yesterday and 40mg  this morning. Overall went from 123lbs (from discharge  in Jan)-->142lbs. She was recommended for direct admission for IV diuresis, for which she presented 01/09/21 once a bed became available.   Hospital Course     Consultants: Palliative care, EP   1. Acute on chronic combined CHF/NICM: patient with EF 35-40% on echo this admission with right-sided heart failure, cor pulmonale, and severe mitral and tricuspid regurgitation with flail segment of mitral valve. She was diuresed with IV lasix and metolazone with UOP net -1.6L this admission. Weight on the day of discharge was 136lbs, down from 145lbs on admission. Decision made to transition to torsemide 20mg  daily at discharge per Dr. Marlou Porch. GDMT limited by CKD. - Continue torsemide 20mg  daily with po potassium 20 mEq daily - Continue metoprolol succinate   2. Permanent atrial fibrillation: Seen by EP this admission and PPM settings adjusted to optimize her CRT-P. Amiodarone was discontinued given persistent atrial fibrillation. She was started on metoprolol succinate as above. Additionally xarelto was transitioned to eliquis given CKD.  - Continue metoprolol succinate for rate control - Continue eliquis 2.5mg  BID for stroke ppx - dose adjusted for age and Cr >1.5  3. Severe mitral and tricuspid regurgitation: noted to have severe mitral and tricuspid regurgitation on echo this admission with flail segment of mitral valve. Felt to be a poor candidate for intervention due to age and comorbidities. This was discussed extensively with the patient/family who were comfortable with the decision for ongoing palliative measures and medical management.  - Continue diuresis as above  4. SSS s/p PPM placement with CRT upgrade 05/2020: Seen by EP to optimize her CRT-P.  - Continue routine outpatient montioring  5. CKD stage IV: Cr stable at 2.93 on the day of discharge.  - Continue to monitor routinely outpatient  Palliative care consulted this admission for [place] discussion. Patient made DNR/DNI. Patient to  follow-up outpatient with palliative care following discharge.   Patient evaluated by PT prior to discharge and recommended for home PT. Face to Face completed for home needs.   Did the patient have an acute coronary syndrome (MI, NSTEMI, STEMI, etc) this admission?:  No                               Did the patient have a percutaneous coronary intervention (stent / angioplasty)?:  No.       _____________  Discharge Vitals Blood pressure 98/65, pulse 70, temperature (!) 97.4 F (36.3 C), temperature source Oral, resp. rate 20, height 5\' 4"  (1.626 m), weight 61.9 kg, SpO2 100 %.  Filed Weights   01/11/21 0034 01/11/21 0500 01/12/21 0359  Weight: 64.1 kg 64.1 kg 61.9 kg    Labs & Radiologic Studies    CBC Recent Labs    01/11/21 0437  WBC 6.7  HGB 9.5*  HCT 31.2*  MCV 93.1  PLT 324   Basic Metabolic Panel Recent Labs    01/11/21 0437 01/12/21 0442  NA 131* 133*  K 3.3* 3.1*  CL 92* 92*  CO2 25 30  GLUCOSE 89 91  BUN 71* 70*  CREATININE 2.81* 2.93*  CALCIUM 9.5 9.5   Liver Function Tests No results for input(s): AST, ALT, ALKPHOS, BILITOT, PROT, ALBUMIN in the last 72 hours. No results for input(s): LIPASE, AMYLASE in the last 72 hours. High Sensitivity Troponin:   No results  for input(s): TROPONINIHS in the last 720 hours.  BNP Invalid input(s): POCBNP D-Dimer No results for input(s): DDIMER in the last 72 hours. Hemoglobin A1C No results for input(s): HGBA1C in the last 72 hours. Fasting Lipid Panel No results for input(s): CHOL, HDL, LDLCALC, TRIG, CHOLHDL, LDLDIRECT in the last 72 hours. Thyroid Function Tests No results for input(s): TSH, T4TOTAL, T3FREE, THYROIDAB in the last 72 hours.  Invalid input(s): FREET3 _____________  US RENAL  Result Date: 01/10/2021 CLINICAL DATA:  Acute renal insufficiency EXAM: RENAL / URINARY TRACT ULTRASOUND COMPLETE COMPARISON:  08/28/2006 FINDINGS: Right Kidney: Renal measurements: 10.1 x 4.5 x 4.4 cm = volume: 103.9  mL. Echogenicity within normal limits. Simple cyst upper pole right kidney measuring 1 cm. No hydronephrosis or nephrolithiasis. Left Kidney: Renal measurements: 10.6 x 3.9 by 4.0 cm = volume: 85.7 mL. Echogenicity within normal limits. No hydronephrosis or nephrolithiasis. Multiple left renal cysts, largest exophytic off the lower pole measuring approximately 5.1 cm. Bladder: Appears normal for degree of bladder distention. Other: None. IMPRESSION: 1. Bilateral simple renal cysts. Otherwise unremarkable renal ultrasound. Electronically Signed   By: Randa Ngo M.D.   On: 01/10/2021 16:55   DG Chest Port 1 View  Result Date: 01/09/2021 CLINICAL DATA:  Chronic heart failure.  Productive cough. EXAM: PORTABLE CHEST 1 VIEW COMPARISON:  11/18/2020. FINDINGS: AICD in stable position. Stable cardiomegaly. Low lung volumes with mild bibasilar atelectasis. Stable small left pleural effusion. No pneumothorax. IMPRESSION: 1. AICD in stable position. Stable cardiomegaly. 2. Low lung volumes with mild bibasilar atelectasis. Stable small left pleural effusion. Electronically Signed   By: Marcello Moores  Register   On: 01/09/2021 05:37   ECHOCARDIOGRAM LIMITED  Result Date: 01/09/2021    ECHOCARDIOGRAM REPORT   Patient Name:   [name] Date of Exam: 01/09/2021 Medical Rec #:  [id number]      Height:       64.0 in Accession #:    [id number]     Weight:       144.4 lb Date of Birth:  [birth year]      BSA:          1.703 m Patient Age:    65 years       BP:           103/65 mmHg Patient Gender: F              HR:           71 bpm. Exam Location:  Inpatient Procedure: Limited Color Doppler, Color Doppler and Cardiac Doppler Indications:    acute systolic chf  History:        Patient has prior history of Echocardiogram examinations, most                 recent 11/15/2020. Pacemaker, chronic kidney disease, Mitral Valve                 Disease, Arrythmias:sick sinus syndrome and Atrial Fibrillation;                 Risk  Factors:Hypertension.  Sonographer:    Johny Chess Referring Phys: [id number] [place]  1. Left ventricular ejection fraction, by estimation, is 35 to 40%. The left ventricle has moderately decreased function. The left ventricle demonstrates global hypokinesis with septal-lateral dyssynchrony. Left ventricular diastolic parameters are consistent with Grade II diastolic dysfunction (pseudonormalization).  2. Right ventricular systolic function is moderately reduced. The right ventricular size is moderately enlarged. There is  mildly elevated pulmonary artery systolic pressure, 42 mmHg. D-shaped interventricular septum suggestive of RV pressure/volume overload.  3. Left atrial size was moderately dilated.  4. Right atrial size was moderately dilated.  5. The mitral valve is degenerative. Severe mitral valve regurgitation. Mitral regurgitation is eccentric and posteriorly-directed with anterior leaflet prolapse and thickening. No evidence of mitral stenosis. Moderate mitral annular calcification.  6. The tricuspid valve is abnormal. Tricuspid valve regurgitation is moderate to severe.  7. The aortic valve is tricuspid. Aortic valve regurgitation is not visualized. Mild to moderate aortic valve sclerosis/calcification is present, without any evidence of aortic stenosis. The right coronary cusp appears fixed.  8. The inferior vena cava is dilated in size with <50% respiratory variability, suggesting right atrial pressure of 15 mmHg.  9. Left pleural effusion noted. FINDINGS  Left Ventricle: Left ventricular ejection fraction, by estimation, is 35 to 40%. The left ventricle has moderately decreased function. The left ventricle demonstrates global hypokinesis. The left ventricular internal cavity size was normal in size. There is no left ventricular hypertrophy. Left ventricular diastolic parameters are consistent with Grade II diastolic dysfunction (pseudonormalization). Right Ventricle: The  right ventricular size is moderately enlarged. No increase in right ventricular wall thickness. Right ventricular systolic function is moderately reduced. There is mildly elevated pulmonary artery systolic pressure. The tricuspid regurgitant velocity is 2.58 m/s, and with an assumed right atrial pressure of 15 mmHg, the estimated right ventricular systolic pressure is 82.9 mmHg. Left Atrium: Left atrial size was moderately dilated. Right Atrium: Right atrial size was moderately dilated. Pericardium: Left pleural effusion noted. Trivial pericardial effusion is present. Mitral Valve: The mitral valve is degenerative in appearance. There is mild calcification of the mitral valve leaflet(s). Moderate mitral annular calcification. Severe mitral valve regurgitation. No evidence of mitral valve stenosis. Tricuspid Valve: The tricuspid valve is abnormal. Tricuspid valve regurgitation is moderate to severe. Aortic Valve: The aortic valve is tricuspid. Aortic valve regurgitation is not visualized. Mild to moderate aortic valve sclerosis/calcification is present, without any evidence of aortic stenosis. Pulmonic Valve: The pulmonic valve was normal in structure. Pulmonic valve regurgitation is trivial. Aorta: The aortic root is normal in size and structure. Venous: The inferior vena cava is dilated in size with less than 50% respiratory variability, suggesting right atrial pressure of 15 mmHg. IAS/Shunts: No atrial level shunt detected by color flow Doppler. Additional Comments: A device lead is visualized in the right ventricle.  LEFT VENTRICLE PLAX 2D LVIDd:         4.60 cm LVIDs:         3.50 cm LV PW:         1.00 cm LV IVS:        0.90 cm LVOT diam:     1.90 cm LV SV:         32 LV SV Index:   19 LVOT Area:     2.84 cm  RIGHT VENTRICLE            IVC RV S prime:     9.25 cm/s  IVC diam: 2.40 cm TAPSE (M-mode): 1.3 cm LEFT ATRIUM         Index LA diam:    4.90 cm 2.88 cm/m  AORTIC VALVE LVOT Vmax:   62.90 cm/s LVOT  Vmean:  39.500 cm/s LVOT VTI:    0.113 m MITRAL VALVE                 TRICUSPID VALVE MV Area (PHT): 3.91 cm  TR Peak grad:   26.6 mmHg MV Decel Time: 194 msec      TR Vmax:        258.00 cm/s MR Peak grad:    103.2 mmHg MR Mean grad:    62.0 mmHg   SHUNTS MR Vmax:         508.00 cm/s Systemic VTI:  0.11 m MR Vmean:        365.0 cm/s  Systemic Diam: 1.90 cm MR PISA:         3.08 cm MR PISA Eff ROA: 24 mm MR PISA Radius:  0.70 cm MV E velocity: 123.00 cm/s MV A velocity: 61.60 cm/s MV E/A ratio:  2.00 Loralie Champagne MD Electronically signed by Loralie Champagne MD Signature Date/Time: 01/09/2021/4:29:57 PM    Final    Disposition   Pt is being discharged home today in good condition.  Follow-up Plans & Appointments     Follow-up Information    [facility] Follow up.   Specialty: Home Health Services Why: HHPT Contact information: [place] [address] [phone]        [facility] Follow up.   Why: Palliative Serivces only- not hospice services. The agency used is [facility]- for Palliative Services only- referral has been made and the office will call the Daughter Butch Penny.  Contact information: [address] [place] [phone]             Discharge Instructions    Diet - low sodium heart healthy   Complete by: As directed    Increase activity slowly   Complete by: As directed       Discharge Medications   Allergies as of 01/12/2021      Reactions   Ace Inhibitors Cough   Amlodipine Other (See Comments), Cough   Higher doses = VERY BAD COUGHING   Celebrex [celecoxib] Other (See Comments)   Reflux   Codeine Nausea Only   Rofecoxib Itching, Swelling, Other (See Comments)   Vioxx- legs swelling, itching   Ether For Anesthesia [ether] Other (See Comments), Cough   Burning and dry cough   Norvasc [amlodipine Besylate] Other (See Comments), Cough   Higher doses = VERY BAD COUGHING       Medication List    STOP taking these medications   amiodarone 200 MG tablet Commonly known as: PACERONE   Xarelto 15 MG Tabs tablet Generic drug: Rivaroxaban     TAKE these medications   acetaminophen 500 MG tablet Commonly known as: TYLENOL Take 500-1,000 mg by mouth every 8 (eight) hours as needed for mild pain.   ALPRAZolam 0.5 MG tablet Commonly known as: XANAX Take 0.5 mg by mouth at bedtime. Take 0.5 mg by mouth at bedtime and an additional 0.25-0.5 mg once a day as needed for anxiety   apixaban 2.5 MG Tabs tablet Commonly known as: ELIQUIS Take 1 tablet (2.5 mg total) by mouth 2 (two) times daily.   B-12 2500 MCG Tabs Take 2,500 mcg by mouth every other day.   busPIRone 5 MG tablet Commonly known as: BUSPAR Take 5 mg by mouth 2 (two) times daily.   Magnesium 400 MG Caps Take 400 mg by mouth 3 (three) times a week.   metoprolol succinate 25 MG 24 hr tablet Commonly known as: TOPROL-XL Take 1 tablet (25 mg total) by mouth daily. Start taking on: January 13, 2021   multivitamin with minerals Tabs tablet Take 1  tablet by mouth daily.   potassium chloride SA 20 MEQ tablet Commonly known as: KLOR-CON Take 1 tablet (20 mEq total) by mouth daily. What changed: when to take this   Systane Complete 0.6 % Soln Generic drug: Propylene Glycol Place 1 drop into both eyes 2 (two) times daily as needed (for dryness).   torsemide 10 MG tablet Commonly known as: DEMADEX Take 2 tablets (20 mg total) by mouth daily. What changed: how much to take          Outstanding Labs/Studies   None  Duration of Discharge Encounter   Greater than 30 minutes including physician time.  Signed, Abigail Butts, PA-C 01/12/2021, 1:39 PM   Personally seen and examined. Agree with above.    Subjective   Feeling better.  Ready to go home.  Palliative care.  Inpatient Medications    Scheduled Meds: . ALPRAZolam  0.5 mg Oral QHS  . apixaban  2.5 mg Oral BID  .  busPIRone  5 mg Oral BID  . metoprolol succinate  25 mg Oral Daily  . sodium chloride flush  3 mL Intravenous Q12H   Continuous Infusions: . sodium chloride     PRN Meds: sodium chloride, acetaminophen, guaiFENesin-dextromethorphan, ondansetron (ZOFRAN) IV, sodium chloride, sodium chloride flush   Vital Signs          Vitals:   01/11/21 1125 01/11/21 1130 01/11/21 2025 01/12/21 0359  BP: 96/63 95/65 97/61  99/61  Pulse: 70 70 70 70  Resp:   16 18  Temp:   (!) 97.4 F (36.3 C) (!) 97.4 F (36.3 C)  TempSrc:   Oral Oral  SpO2: 97% 97% 98% 93%  Weight:    61.9 kg  Height:        Intake/Output Summary (Last 24 hours) at 01/12/2021 0854 Last data filed at 01/11/2021 2000    Gross per 24 hour  Intake 300 ml  Output 800 ml  Net -500 ml   Last 3 Weights 01/12/2021 01/11/2021 01/11/2021  Weight (lbs) 136 lb 8 oz 141 lb 6.4 oz 141 lb 6.4 oz  Weight (kg) 61.916 kg 64.139 kg 64.139 kg      Telemetry    Ventricular paced rhythm- Personally Reviewed  ECG    Ventricular pacing- Personally Reviewed  Physical Exam   GEN:No acute distress.   Neck:No JVD Cardiac:RRR, 3/6 holosystolic murmur Respiratory:Clear to auscultation bilaterally. CX:KGYJ, nontender, non-distended  MS: 1+ lower extremity edema; No deformity. Neuro:Nonfocal  Psych: Normal affect   Labs    High Sensitivity Troponin:   Last Labs   No results for input(s): TROPONINIHS in the last 720 hours.      Chemistry Last Labs          Recent Labs  Lab 01/08/21 2313 01/09/21 0422 01/10/21 0342 01/11/21 0437 01/12/21 0442  NA 132*   < > 131* 131* 133*  K 4.9   < > 4.6 3.3* 3.1*  CL 96*   < > 94* 92* 92*  CO2 24   < > 26 25 30   GLUCOSE 101*   < > 97 89 91  BUN 65*   < > 67* 71* 70*  CREATININE 2.55*   < > 2.92* 2.81* 2.93*  CALCIUM 9.8   < > 9.4 9.5 9.5  PROT 6.6  --   --   --   --   ALBUMIN 3.2*  --   --   --   --   AST 45*  --   --   --   --  ALT 36  --   --   --   --    ALKPHOS 120  --   --   --   --   BILITOT 2.5*  --   --   --   --   GFRNONAA 18*   < > 15* 16* 15*  ANIONGAP 12   < > 11 14 11    < > = values in this interval not displayed.       Hematology Last Labs      Recent Labs  Lab 01/11/21 0437  WBC 6.7  RBC 3.35*  HGB 9.5*  HCT 31.2*  MCV 93.1  MCH 28.4  MCHC 30.4  RDW 18.1*  PLT 163      BNP Last Labs   Recent Labs  Lab 01/08/21 1055 01/08/21 2313  BNP  --  539.1*  PROBNP 1,494*  --        DDimer  Last Labs   No results for input(s): DDIMER in the last 168 hours.     Radiology     Imaging Results (Last 48 hours)  US RENAL  Result Date: 01/10/2021 CLINICAL DATA:  Acute renal insufficiency EXAM: RENAL / URINARY TRACT ULTRASOUND COMPLETE COMPARISON:  08/28/2006 FINDINGS: Right Kidney: Renal measurements: 10.1 x 4.5 x 4.4 cm = volume: 103.9 mL. Echogenicity within normal limits. Simple cyst upper pole right kidney measuring 1 cm. No hydronephrosis or nephrolithiasis. Left Kidney: Renal measurements: 10.6 x 3.9 by 4.0 cm = volume: 85.7 mL. Echogenicity within normal limits. No hydronephrosis or nephrolithiasis. Multiple left renal cysts, largest exophytic off the lower pole measuring approximately 5.1 cm. Bladder: Appears normal for degree of bladder distention. Other: None. IMPRESSION: 1. Bilateral simple renal cysts. Otherwise unremarkable renal ultrasound. Electronically Signed   By: Randa Ngo M.D.   On: 01/10/2021 16:55     Cardiac Studies   TTE 01/09/2021 1. Left ventricular ejection fraction, by estimation, is 35 to 40%. The  left ventricle has moderately decreased function. The left ventricle  demonstrates global hypokinesis with septal-lateral dyssynchrony. Left  ventricular diastolic parameters are  consistent with Grade II diastolic dysfunction (pseudonormalization).  2. Right ventricular systolic function is moderately reduced. The right  ventricular size is moderately enlarged. There is  mildly elevated  pulmonary artery systolic pressure, 42 mmHg. D-shaped interventricular  septum suggestive of RV pressure/volume  overload.  3. Left atrial size was moderately dilated.  4. Right atrial size was moderately dilated.  5. The mitral valve is degenerative. Severe mitral valve regurgitation.  Mitral regurgitation is eccentric and posteriorly-directed with anterior  leaflet prolapse and thickening. No evidence of mitral stenosis. Moderate  mitral annular calcification.  6. The tricuspid valve is abnormal. Tricuspid valve regurgitation is  moderate to severe.  7. The aortic valve is tricuspid. Aortic valve regurgitation is not  visualized. Mild to moderate aortic valve sclerosis/calcification is  present, without any evidence of aortic stenosis. The right coronary cusp  appears fixed.  8. The inferior vena cava is dilated in size with <50% respiratory  variability, suggesting right atrial pressure of 15 mmHg.  9. Left pleural effusion noted.  Patient Profile     87 y.o. female admitted with acute decompensated systolic heart failure, nonischemic cardiomyopathy, RV pacing, persistent atrial fibrillation, chronic kidney disease stage IV with severe mitral regurgitation, severe tricuspid regurgitation  Assessment & Plan    Acute systolic heart failure/EF 35 to 40% with right-sided heart failure cor pulmonale severe mitral vegetation and severe tricuspid regurgitation -EF  is now reduced to 35 to 40% and likely related to flail segment of mitral valve.  Has severe eccentric MR.  She also has severe tricuspid regurgitation as well as RV failure. -Palliative care team, Dr. Audie Box has had extensive discussion with family, daughter.  Not a candidate for invasive therapies.  She is comfortable with this.  Continue with palliative measures, medical management.  DNR/DNI -She responded well to both Lasix and Zaroxolyn.  Sodium, hyponatremic with 133, stable however.  Creatinine 2.93  with BUN of 70, stable over the past 3 days.  Blood pressure has been soft ranging from 19-37 systolic.  Continue with low-dose metoprolol 25 mg.  Not a candidate for ACE inhibitor.  Would not tolerate hydralazine or isosorbide. -We will place on 20 mg of Torsemide p.o. daily for continued maintenance at home. May need to increase to BID depending on clinical symptoms.  Severe tricuspid regurgitation and mitral regurgitation -Suspect flail segment of mitral valve.  Palliative approach.  Family is in agreement.  Permanent atrial fibrillation status post CRT pacer -Persistent A. fib/permanent A. fib.  Stopping amiodarone. -Given her decline in renal function, Eliquis will be best option.  2.5 mg twice a day.  Watch for any signs of bleeding.  Chronic knee disease stage IV -Avoid other nephrotoxic agents.  Physical therapy is currently assessing.  She would like to go home.  She does state that she has help at home.  Utilizes a walker.   For questions or updates, please contact [place] Please consult [email or web address] for contact info under        Signed, Candee Furbish, MD

## 2021-01-12 NOTE — Care Management (Signed)
[facility] notified of DC, patient provided with Eliquis 30 day card.

## 2021-01-15 ENCOUNTER — Other Ambulatory Visit: Payer: Self-pay | Admitting: *Deleted

## 2021-01-15 NOTE — Patient Outreach (Signed)
[facility]) Care Management  01/15/2021  [name] May 27, 1930 [id number]   EMMI-GENERAL DISCHARGE-RESOLVED RED ON EMMI ALERT Day #1 Date: 01/14/2021 Red Alert Reason: NO FOLLOW UP APPOINTMENT, CHANGE IN MEDICATIONS  OUTREACH #1 RN spoke with two sisters today [name] & [name]). [name] is the primary caregiver and confirmed the above emmis have been resolved. Family is filling pt's pill box for consistency of all medications and someone is with the pt around the clock with family assistance and hired help. [name] indicated the follow up appointment is now scheduled for 01/29/2021.  Confirmed orders for [facility] who will be visiting today and [facility]. RN provided numbers to both involved agencies.   Due to the involvement of [facility] and pt's primary provider Dr. Melford Aase pt is not eligibile for [facility] services at this time and will her case will be closed. Bother daughter updated on this information and aware to follow up with the [facility] RN for needed assistance. Pt is also with [facility] via Dr. Caryl Comes ([facility]) for IV therapy for her ongoing HF.   No other assistance needed at this time. Case will be closed and provider notified.  Mary Mina, RN Care Management Coordinator [place] [facility] [phone]

## 2021-01-17 ENCOUNTER — Ambulatory Visit: Payer: Medicare Other | Admitting: Cardiology

## 2021-01-23 ENCOUNTER — Ambulatory Visit: Payer: Self-pay | Admitting: *Deleted

## 2021-01-24 IMAGING — CR DG CHEST 2V
2 series · 2 of 2 positions shown · non-contrast
Comparison: January 20, 2019.

CLINICAL DATA: Pacemaker upgrade.

EXAM:
CHEST - 2 VIEW

[w chest pa]
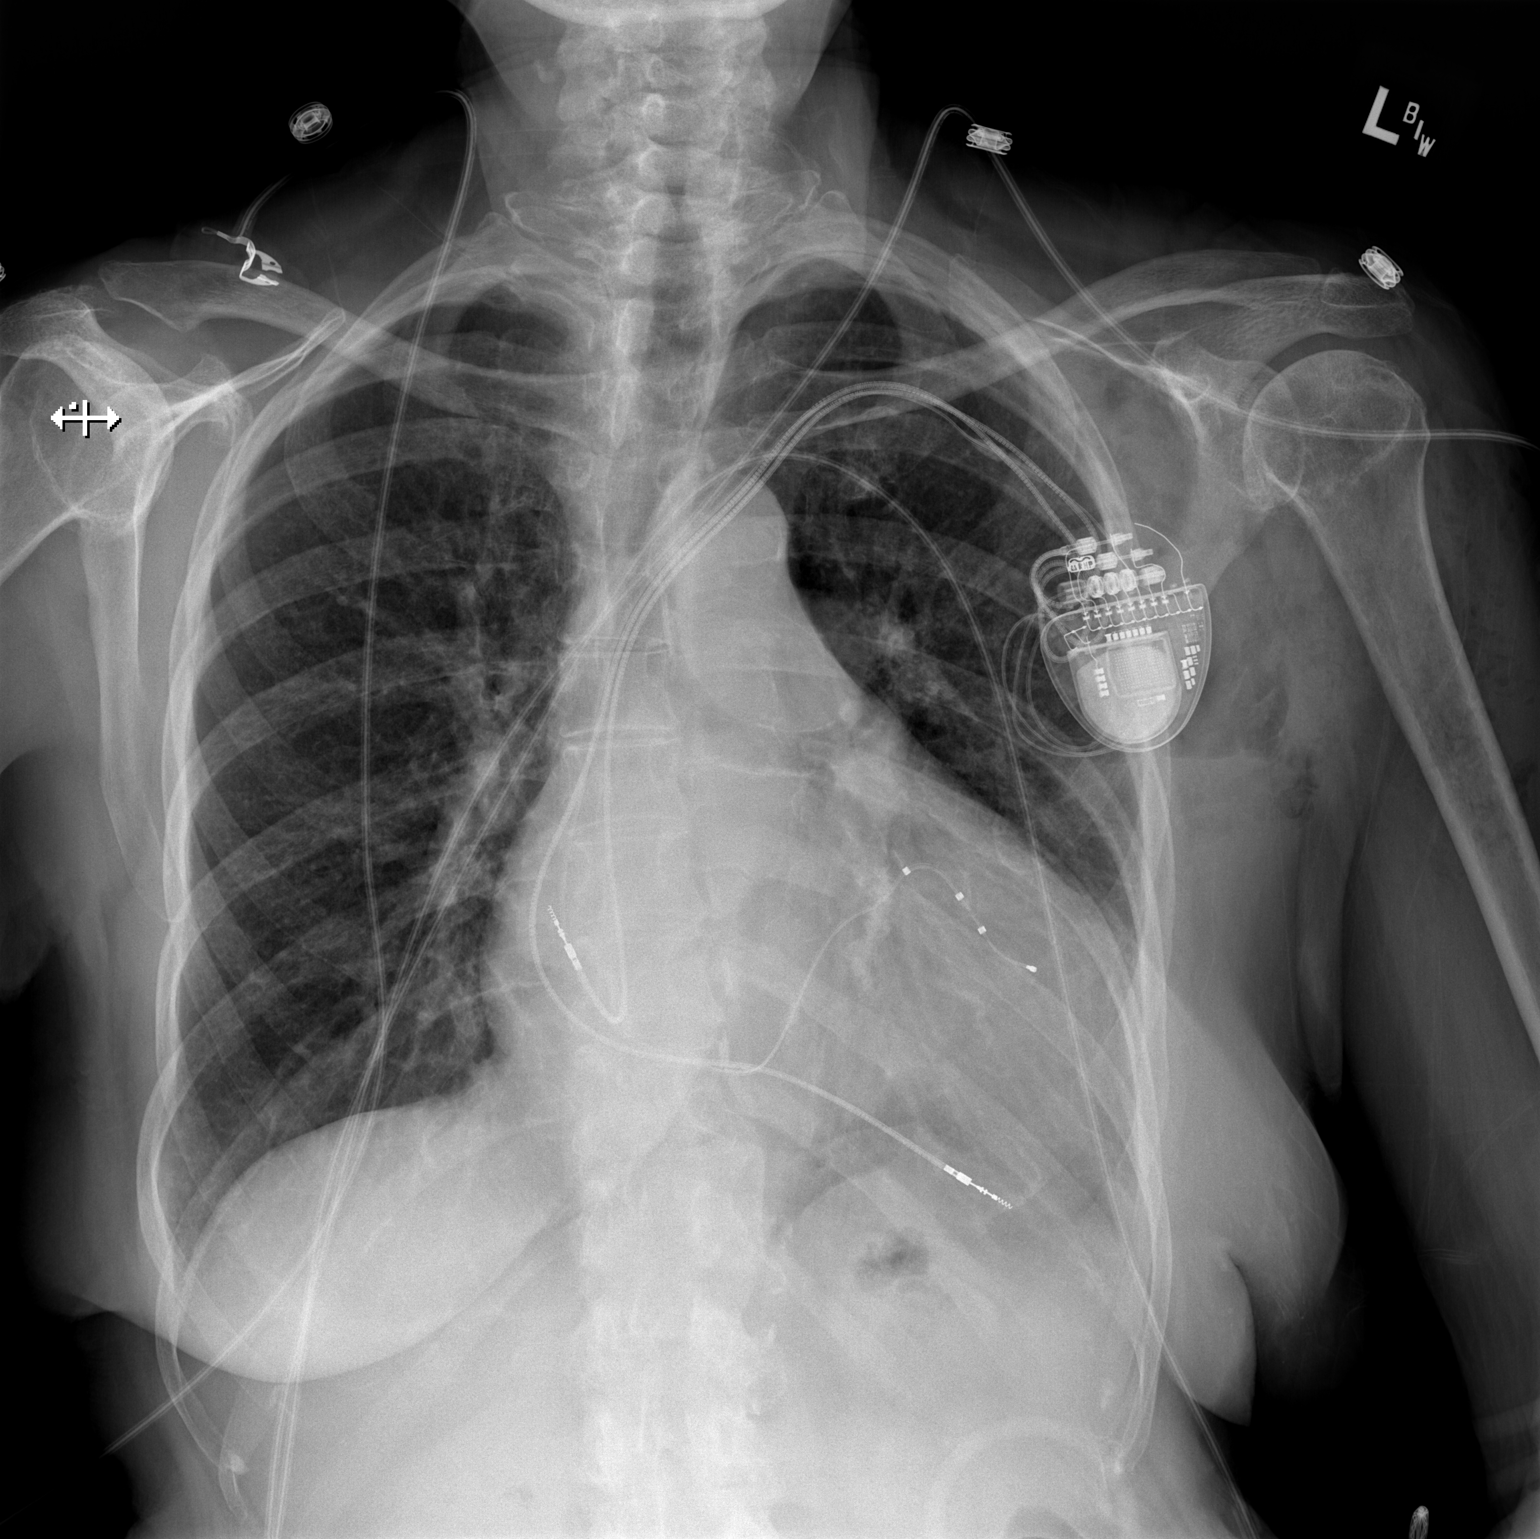

[w chest lat]
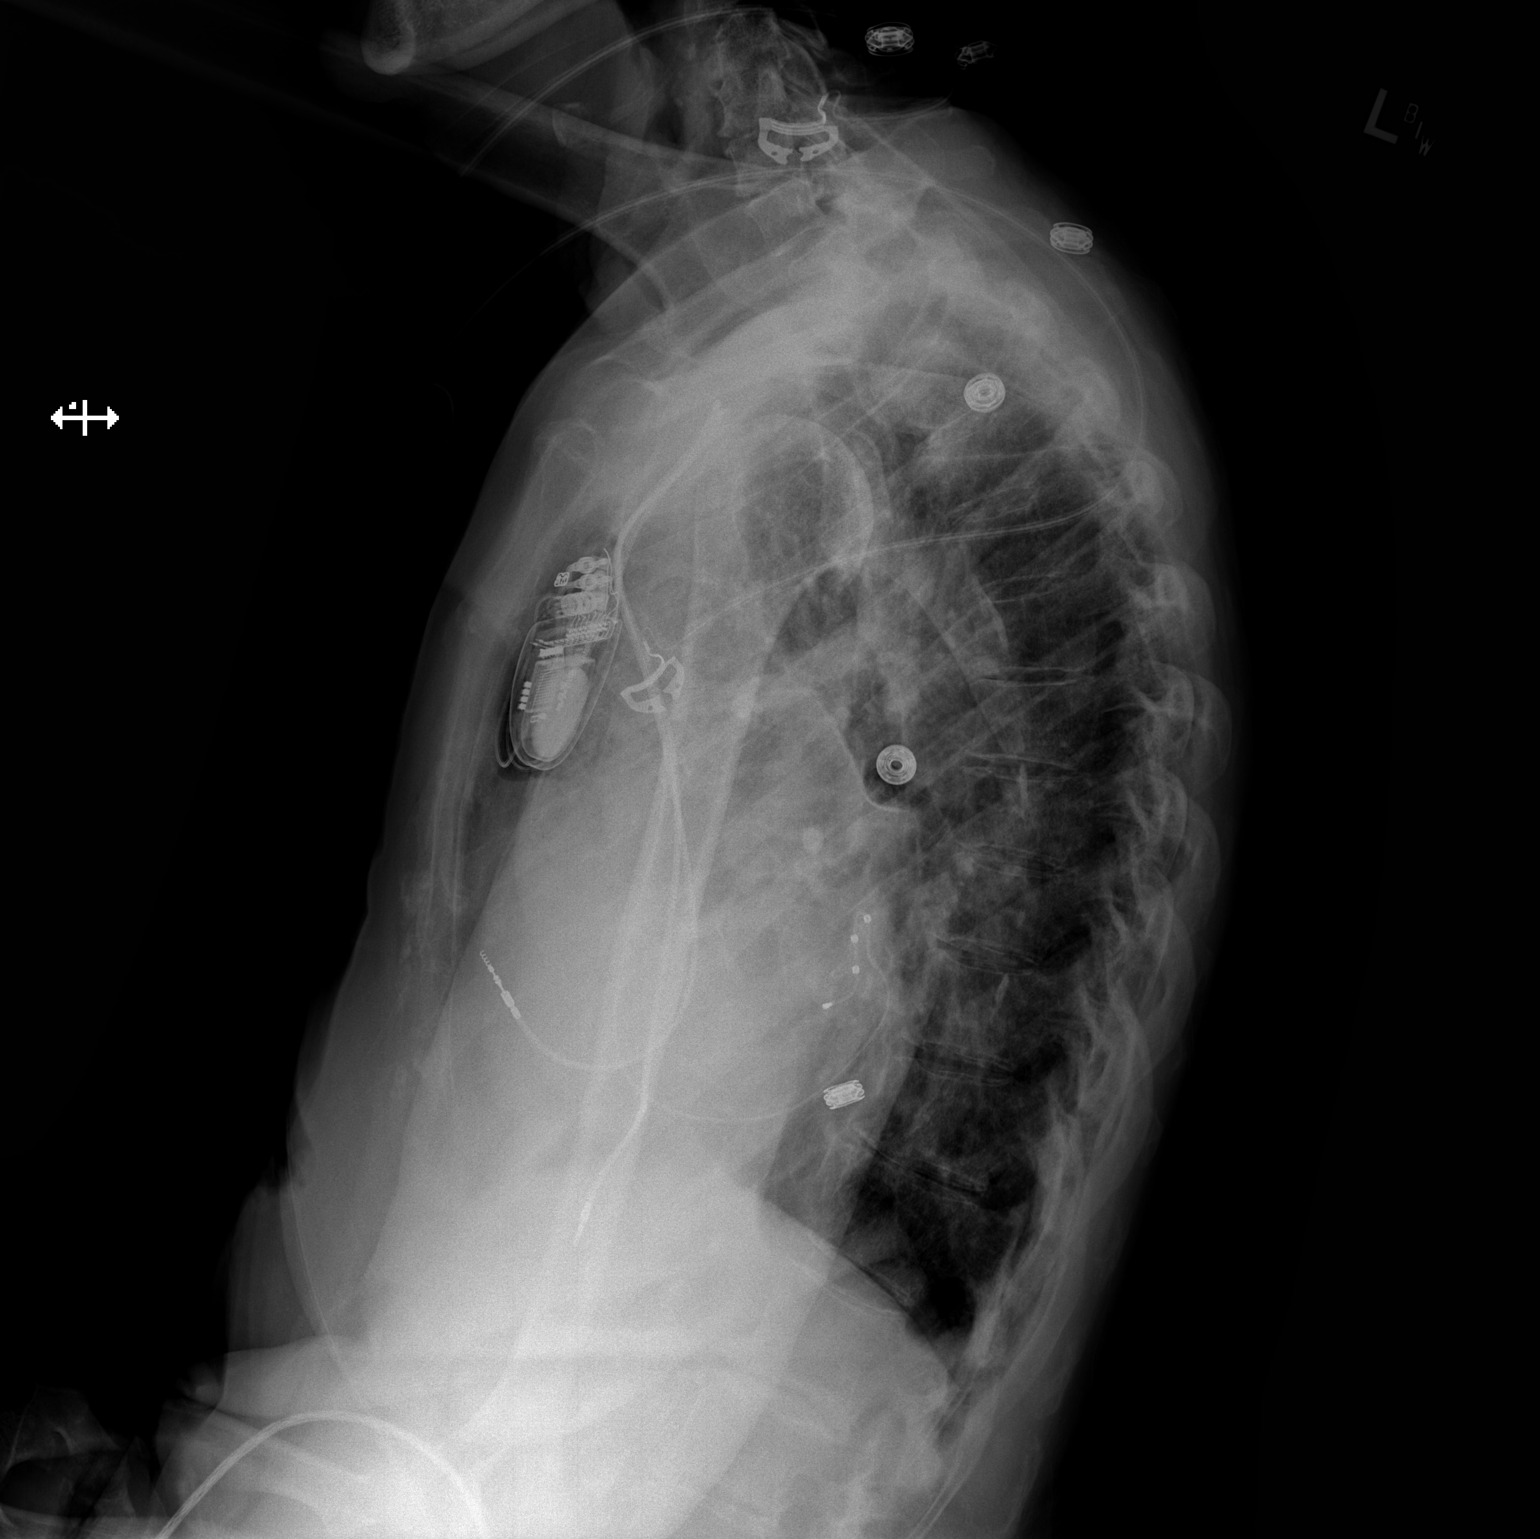

[2 of 2 positions shown; findings below may reference images not displayed]

FINDINGS: Stable cardiomediastinal silhouette. Both lungs are clear. No
pneumothorax or pleural effusion is noted. Left-sided pacemaker is
again noted with leads noted on prior exam in grossly good position.
There appears to be a new lead present which may be in the coronary
sinus. The visualized skeletal structures are unremarkable.
IMPRESSION: Left-sided pacemaker is noted with leads noted on prior exam in
grossly good position. There appears to be a new lead present which
may be in the coronary sinus.

## 2021-01-25 NOTE — Telephone Encounter (Signed)
Spoke with pt's daughter Butch Penny, [place] who reports pt passed away on 2021/02/04.  Offered condolences to daughter and family and advised will forward information to providers as requested.Pt's daughter thanked Therapist, sports for the call.

## 2021-01-29 ENCOUNTER — Ambulatory Visit: Payer: Medicare Other | Admitting: Cardiology

## 2021-02-08 DEATH — deceased

## 2021-03-27 ENCOUNTER — Encounter: Payer: Medicare Other | Admitting: Internal Medicine
